# Patient Record
Sex: Female | Born: 1940
Health system: Southern US, Community
[De-identification: ages and names within clinical notes are randomized; demographics above are authoritative.]

## PROBLEM LIST (undated history)

## (undated) DIAGNOSIS — Z96649 Presence of unspecified artificial hip joint: Secondary | ICD-10-CM

## (undated) DIAGNOSIS — M810 Age-related osteoporosis without current pathological fracture: Secondary | ICD-10-CM

## (undated) DIAGNOSIS — C50919 Malignant neoplasm of unspecified site of unspecified female breast: Secondary | ICD-10-CM

## (undated) DIAGNOSIS — M1990 Unspecified osteoarthritis, unspecified site: Secondary | ICD-10-CM

## (undated) DIAGNOSIS — H269 Unspecified cataract: Secondary | ICD-10-CM

## (undated) DIAGNOSIS — R232 Flushing: Secondary | ICD-10-CM

## (undated) DIAGNOSIS — N390 Urinary tract infection, site not specified: Secondary | ICD-10-CM

## (undated) DIAGNOSIS — E785 Hyperlipidemia, unspecified: Secondary | ICD-10-CM

## (undated) DIAGNOSIS — Z8619 Personal history of other infectious and parasitic diseases: Secondary | ICD-10-CM

## (undated) DIAGNOSIS — F419 Anxiety disorder, unspecified: Secondary | ICD-10-CM

## (undated) DIAGNOSIS — T7840XA Allergy, unspecified, initial encounter: Secondary | ICD-10-CM

## (undated) DIAGNOSIS — Z5189 Encounter for other specified aftercare: Secondary | ICD-10-CM

## (undated) HISTORY — DX: Urinary tract infection, site not specified: N39.0

## (undated) HISTORY — DX: Hyperlipidemia, unspecified: E78.5

## (undated) HISTORY — PX: BREAST SURGERY: SHX581

## (undated) HISTORY — DX: Age-related osteoporosis without current pathological fracture: M81.0

## (undated) HISTORY — DX: Personal history of other infectious and parasitic diseases: Z86.19

## (undated) HISTORY — PX: TOTAL HIP ARTHROPLASTY: SHX124

## (undated) HISTORY — DX: Presence of unspecified artificial hip joint: Z96.649

## (undated) HISTORY — DX: Anxiety disorder, unspecified: F41.9

## (undated) HISTORY — DX: Encounter for other specified aftercare: Z51.89

## (undated) HISTORY — DX: Unspecified osteoarthritis, unspecified site: M19.90

## (undated) HISTORY — DX: Unspecified cataract: H26.9

## (undated) HISTORY — PX: OOPHORECTOMY: SHX86

## (undated) HISTORY — DX: Malignant neoplasm of unspecified site of unspecified female breast: C50.919

## (undated) HISTORY — DX: Flushing: R23.2

## (undated) HISTORY — PX: TUBAL LIGATION: SHX77

## (undated) HISTORY — PX: TOTAL SHOULDER REPLACEMENT: SUR1217

## (undated) HISTORY — DX: Allergy, unspecified, initial encounter: T78.40XA

## (undated) HISTORY — PX: JOINT REPLACEMENT: SHX530

## (undated) HISTORY — PX: EYE SURGERY: SHX253

## (undated) HISTORY — PX: POLYPECTOMY: SHX149

---

## 1982-02-21 ENCOUNTER — Encounter (INDEPENDENT_AMBULATORY_CARE_PROVIDER_SITE_OTHER): Payer: Self-pay | Admitting: *Deleted

## 1982-02-21 LAB — CONVERTED CEMR LAB
RBC count: 4.36 10*6/uL
WBC, blood: 6.5 10*3/uL

## 1982-02-22 ENCOUNTER — Encounter (INDEPENDENT_AMBULATORY_CARE_PROVIDER_SITE_OTHER): Payer: Self-pay | Admitting: Internal Medicine

## 1982-02-22 LAB — CONVERTED CEMR LAB
RBC count: 4.36 10*6/uL
WBC, blood: 6.5 10*3/uL

## 1995-09-26 ENCOUNTER — Encounter (INDEPENDENT_AMBULATORY_CARE_PROVIDER_SITE_OTHER): Payer: Self-pay | Admitting: *Deleted

## 1995-09-26 LAB — CONVERTED CEMR LAB: TSH: 0.69 microintl units/mL

## 1995-09-27 ENCOUNTER — Encounter (INDEPENDENT_AMBULATORY_CARE_PROVIDER_SITE_OTHER): Payer: Self-pay | Admitting: Internal Medicine

## 1995-09-27 LAB — CONVERTED CEMR LAB: TSH: 0.69 microintl units/mL

## 1996-04-03 ENCOUNTER — Encounter (INDEPENDENT_AMBULATORY_CARE_PROVIDER_SITE_OTHER): Payer: Self-pay | Admitting: *Deleted

## 1996-04-03 LAB — CONVERTED CEMR LAB: RBC count: 4.6 10*6/uL

## 1996-06-05 DIAGNOSIS — E7800 Pure hypercholesterolemia, unspecified: Secondary | ICD-10-CM | POA: Insufficient documentation

## 1996-10-01 ENCOUNTER — Encounter (INDEPENDENT_AMBULATORY_CARE_PROVIDER_SITE_OTHER): Payer: Self-pay | Admitting: *Deleted

## 1996-10-01 ENCOUNTER — Encounter (INDEPENDENT_AMBULATORY_CARE_PROVIDER_SITE_OTHER): Payer: Self-pay | Admitting: Internal Medicine

## 1996-10-01 LAB — CONVERTED CEMR LAB
Blood Glucose, Fasting: 79 mg/dL
Blood Glucose, Fasting: 79 mg/dL
RBC count: 4.6 10*6/uL
WBC, blood: 5.2 10*3/uL
WBC, blood: 5.2 10*3/uL

## 1997-01-28 ENCOUNTER — Encounter (INDEPENDENT_AMBULATORY_CARE_PROVIDER_SITE_OTHER): Payer: Self-pay | Admitting: *Deleted

## 1997-01-28 LAB — CONVERTED CEMR LAB: Rapid Strep: NEGATIVE

## 1997-04-30 ENCOUNTER — Other Ambulatory Visit: Admission: RE | Admit: 1997-04-30 | Discharge: 1997-04-30 | Payer: Self-pay | Admitting: Obstetrics and Gynecology

## 1998-04-13 ENCOUNTER — Other Ambulatory Visit: Admission: RE | Admit: 1998-04-13 | Discharge: 1998-04-13 | Payer: Self-pay | Admitting: Obstetrics and Gynecology

## 1998-12-25 ENCOUNTER — Emergency Department (HOSPITAL_COMMUNITY): Admission: EM | Admit: 1998-12-25 | Discharge: 1998-12-25 | Payer: Self-pay | Admitting: Emergency Medicine

## 1998-12-25 ENCOUNTER — Encounter: Payer: Self-pay | Admitting: Emergency Medicine

## 1999-04-13 ENCOUNTER — Other Ambulatory Visit: Admission: RE | Admit: 1999-04-13 | Discharge: 1999-04-13 | Payer: Self-pay | Admitting: Obstetrics and Gynecology

## 1999-06-07 ENCOUNTER — Encounter: Payer: Self-pay | Admitting: Urology

## 1999-06-07 ENCOUNTER — Encounter: Admission: RE | Admit: 1999-06-07 | Discharge: 1999-06-07 | Payer: Self-pay | Admitting: Urology

## 1999-11-21 ENCOUNTER — Encounter: Admission: RE | Admit: 1999-11-21 | Discharge: 1999-11-21 | Payer: Self-pay | Admitting: Family Medicine

## 1999-11-21 ENCOUNTER — Encounter: Payer: Self-pay | Admitting: Family Medicine

## 2000-05-06 ENCOUNTER — Other Ambulatory Visit: Admission: RE | Admit: 2000-05-06 | Discharge: 2000-05-06 | Payer: Self-pay | Admitting: Obstetrics and Gynecology

## 2000-08-13 ENCOUNTER — Encounter: Admission: RE | Admit: 2000-08-13 | Discharge: 2000-08-13 | Payer: Self-pay | Admitting: Neurology

## 2000-08-13 ENCOUNTER — Encounter: Payer: Self-pay | Admitting: Neurology

## 2000-10-02 ENCOUNTER — Other Ambulatory Visit: Admission: RE | Admit: 2000-10-02 | Discharge: 2000-10-02 | Payer: Self-pay | Admitting: Obstetrics and Gynecology

## 2000-10-02 ENCOUNTER — Encounter (INDEPENDENT_AMBULATORY_CARE_PROVIDER_SITE_OTHER): Payer: Self-pay | Admitting: Specialist

## 2001-02-20 ENCOUNTER — Encounter: Payer: Self-pay | Admitting: Sports Medicine

## 2001-02-20 ENCOUNTER — Encounter: Admission: RE | Admit: 2001-02-20 | Discharge: 2001-02-20 | Payer: Self-pay | Admitting: Sports Medicine

## 2001-05-19 ENCOUNTER — Encounter: Admission: RE | Admit: 2001-05-19 | Discharge: 2001-05-19 | Payer: Self-pay | Admitting: Orthopedic Surgery

## 2001-05-19 ENCOUNTER — Encounter: Payer: Self-pay | Admitting: Orthopedic Surgery

## 2001-05-20 ENCOUNTER — Other Ambulatory Visit: Admission: RE | Admit: 2001-05-20 | Discharge: 2001-05-20 | Payer: Self-pay | Admitting: Obstetrics and Gynecology

## 2001-06-02 ENCOUNTER — Encounter: Payer: Self-pay | Admitting: Orthopedic Surgery

## 2001-06-02 ENCOUNTER — Encounter: Admission: RE | Admit: 2001-06-02 | Discharge: 2001-06-02 | Payer: Self-pay | Admitting: Orthopedic Surgery

## 2001-08-04 ENCOUNTER — Encounter: Payer: Self-pay | Admitting: Orthopedic Surgery

## 2001-08-06 ENCOUNTER — Encounter: Payer: Self-pay | Admitting: Orthopedic Surgery

## 2001-08-06 ENCOUNTER — Inpatient Hospital Stay (HOSPITAL_COMMUNITY): Admission: RE | Admit: 2001-08-06 | Discharge: 2001-08-12 | Payer: Self-pay | Admitting: Orthopedic Surgery

## 2002-04-14 ENCOUNTER — Encounter: Admission: RE | Admit: 2002-04-14 | Discharge: 2002-04-14 | Payer: Self-pay | Admitting: Orthopedic Surgery

## 2002-04-14 ENCOUNTER — Encounter: Payer: Self-pay | Admitting: Orthopedic Surgery

## 2002-04-23 ENCOUNTER — Encounter (INDEPENDENT_AMBULATORY_CARE_PROVIDER_SITE_OTHER): Payer: Self-pay | Admitting: Internal Medicine

## 2002-04-23 ENCOUNTER — Emergency Department (HOSPITAL_COMMUNITY): Admission: EM | Admit: 2002-04-23 | Discharge: 2002-04-23 | Payer: Self-pay | Admitting: Emergency Medicine

## 2002-04-23 ENCOUNTER — Encounter: Payer: Self-pay | Admitting: Emergency Medicine

## 2002-04-23 LAB — CONVERTED CEMR LAB: Blood Glucose, Fasting: 97 mg/dL

## 2002-04-24 ENCOUNTER — Encounter (INDEPENDENT_AMBULATORY_CARE_PROVIDER_SITE_OTHER): Payer: Self-pay | Admitting: Internal Medicine

## 2002-04-24 ENCOUNTER — Encounter (INDEPENDENT_AMBULATORY_CARE_PROVIDER_SITE_OTHER): Payer: Self-pay | Admitting: *Deleted

## 2002-04-24 LAB — CONVERTED CEMR LAB
Blood Glucose, Fasting: 97 mg/dL
RBC count: 4.09 10*6/uL
WBC, blood: 5.5 10*3/uL

## 2002-06-15 ENCOUNTER — Other Ambulatory Visit: Admission: RE | Admit: 2002-06-15 | Discharge: 2002-06-15 | Payer: Self-pay | Admitting: Obstetrics and Gynecology

## 2002-08-10 DIAGNOSIS — K5730 Diverticulosis of large intestine without perforation or abscess without bleeding: Secondary | ICD-10-CM | POA: Insufficient documentation

## 2002-10-16 ENCOUNTER — Encounter: Payer: Self-pay | Admitting: Orthopedic Surgery

## 2002-10-16 ENCOUNTER — Inpatient Hospital Stay (HOSPITAL_COMMUNITY): Admission: RE | Admit: 2002-10-16 | Discharge: 2002-10-21 | Payer: Self-pay | Admitting: Orthopedic Surgery

## 2003-03-08 ENCOUNTER — Encounter: Admission: RE | Admit: 2003-03-08 | Discharge: 2003-03-08 | Payer: Self-pay | Admitting: Family Medicine

## 2003-07-19 ENCOUNTER — Other Ambulatory Visit: Admission: RE | Admit: 2003-07-19 | Discharge: 2003-07-19 | Payer: Self-pay | Admitting: Obstetrics and Gynecology

## 2003-07-26 ENCOUNTER — Encounter: Admission: RE | Admit: 2003-07-26 | Discharge: 2003-07-26 | Payer: Self-pay | Admitting: Obstetrics and Gynecology

## 2003-10-25 ENCOUNTER — Encounter (INDEPENDENT_AMBULATORY_CARE_PROVIDER_SITE_OTHER): Payer: Self-pay | Admitting: *Deleted

## 2003-10-25 LAB — CONVERTED CEMR LAB: Rapid Strep: NEGATIVE

## 2004-08-17 ENCOUNTER — Other Ambulatory Visit: Admission: RE | Admit: 2004-08-17 | Discharge: 2004-08-17 | Payer: Self-pay | Admitting: Obstetrics and Gynecology

## 2005-04-30 ENCOUNTER — Inpatient Hospital Stay (HOSPITAL_COMMUNITY): Admission: RE | Admit: 2005-04-30 | Discharge: 2005-05-02 | Payer: Self-pay | Admitting: Orthopedic Surgery

## 2005-05-02 ENCOUNTER — Encounter (INDEPENDENT_AMBULATORY_CARE_PROVIDER_SITE_OTHER): Payer: Self-pay | Admitting: Internal Medicine

## 2005-05-02 LAB — CONVERTED CEMR LAB
RBC count: 3.57 10*6/uL
WBC, blood: 7.1 10*3/uL

## 2006-10-13 ENCOUNTER — Encounter: Admission: RE | Admit: 2006-10-13 | Discharge: 2006-10-13 | Payer: Self-pay | Admitting: Radiology

## 2006-10-25 ENCOUNTER — Encounter (INDEPENDENT_AMBULATORY_CARE_PROVIDER_SITE_OTHER): Payer: Self-pay | Admitting: Diagnostic Radiology

## 2006-10-25 ENCOUNTER — Encounter: Admission: RE | Admit: 2006-10-25 | Discharge: 2006-10-25 | Payer: Self-pay | Admitting: Radiology

## 2006-11-01 ENCOUNTER — Encounter: Admission: RE | Admit: 2006-11-01 | Discharge: 2006-11-01 | Payer: Self-pay | Admitting: General Surgery

## 2006-11-04 ENCOUNTER — Encounter (INDEPENDENT_AMBULATORY_CARE_PROVIDER_SITE_OTHER): Payer: Self-pay | Admitting: General Surgery

## 2006-11-04 ENCOUNTER — Ambulatory Visit (HOSPITAL_BASED_OUTPATIENT_CLINIC_OR_DEPARTMENT_OTHER): Admission: RE | Admit: 2006-11-04 | Discharge: 2006-11-04 | Payer: Self-pay | Admitting: General Surgery

## 2006-11-08 ENCOUNTER — Ambulatory Visit: Payer: Self-pay | Admitting: Oncology

## 2006-11-18 ENCOUNTER — Ambulatory Visit: Admission: RE | Admit: 2006-11-18 | Discharge: 2007-01-26 | Payer: Self-pay | Admitting: Radiation Oncology

## 2006-11-19 LAB — CBC WITH DIFFERENTIAL/PLATELET
BASO%: 0.8 % (ref 0.0–2.0)
Basophils Absolute: 0 10*3/uL (ref 0.0–0.1)
EOS%: 1.5 % (ref 0.0–7.0)
Eosinophils Absolute: 0.1 10*3/uL (ref 0.0–0.5)
HCT: 38.7 % (ref 34.8–46.6)
HGB: 13.5 g/dL (ref 11.6–15.9)
LYMPH%: 34.4 % (ref 14.0–48.0)
MCH: 31.4 pg (ref 26.0–34.0)
MCHC: 34.9 g/dL (ref 32.0–36.0)
MCV: 89.9 fL (ref 81.0–101.0)
MONO#: 0.4 10*3/uL (ref 0.1–0.9)
MONO%: 9.4 % (ref 0.0–13.0)
NEUT#: 2.4 10*3/uL (ref 1.5–6.5)
NEUT%: 53.9 % (ref 39.6–76.8)
Platelets: 231 10*3/uL (ref 145–400)
RBC: 4.3 10*6/uL (ref 3.70–5.32)
RDW: 13.7 % (ref 11.3–14.5)
WBC: 4.4 10*3/uL (ref 3.9–10.0)
lymph#: 1.5 10*3/uL (ref 0.9–3.3)

## 2006-11-19 LAB — COMPREHENSIVE METABOLIC PANEL
ALT: 10 U/L (ref 0–35)
AST: 18 U/L (ref 0–37)
Alkaline Phosphatase: 61 U/L (ref 39–117)
BUN: 10 mg/dL (ref 6–23)
Creatinine, Ser: 0.61 mg/dL (ref 0.40–1.20)
Total Bilirubin: 0.6 mg/dL (ref 0.3–1.2)

## 2006-11-22 ENCOUNTER — Ambulatory Visit (HOSPITAL_COMMUNITY): Admission: RE | Admit: 2006-11-22 | Discharge: 2006-11-22 | Payer: Self-pay | Admitting: Oncology

## 2007-01-20 ENCOUNTER — Ambulatory Visit: Payer: Self-pay | Admitting: Oncology

## 2007-01-21 ENCOUNTER — Encounter: Payer: Self-pay | Admitting: Family Medicine

## 2007-02-10 ENCOUNTER — Encounter: Payer: Self-pay | Admitting: Family Medicine

## 2007-02-11 LAB — COMPREHENSIVE METABOLIC PANEL
ALT: 24 U/L (ref 0–35)
AST: 29 U/L (ref 0–37)
Calcium: 9.4 mg/dL (ref 8.4–10.5)
Chloride: 105 mEq/L (ref 96–112)
Creatinine, Ser: 0.67 mg/dL (ref 0.40–1.20)
Total Bilirubin: 0.7 mg/dL (ref 0.3–1.2)

## 2007-02-11 LAB — CBC WITH DIFFERENTIAL/PLATELET
BASO%: 0 % (ref 0.0–2.0)
EOS%: 2.2 % (ref 0.0–7.0)
HGB: 13.2 g/dL (ref 11.6–15.9)
MCV: 90.6 fL (ref 81.0–101.0)
NEUT#: 2.7 10*3/uL (ref 1.5–6.5)
NEUT%: 77 % — ABNORMAL HIGH (ref 39.6–76.8)
Platelets: 184 10*3/uL (ref 145–400)
RDW: 13.7 % (ref 11.3–14.5)
WBC: 3.5 10*3/uL — ABNORMAL LOW (ref 3.9–10.0)
lymph#: 0.4 10*3/uL — ABNORMAL LOW (ref 0.9–3.3)

## 2007-03-19 ENCOUNTER — Encounter: Payer: Self-pay | Admitting: Internal Medicine

## 2007-03-19 DIAGNOSIS — Z87448 Personal history of other diseases of urinary system: Secondary | ICD-10-CM | POA: Insufficient documentation

## 2007-03-19 DIAGNOSIS — M2240 Chondromalacia patellae, unspecified knee: Secondary | ICD-10-CM | POA: Insufficient documentation

## 2007-03-19 DIAGNOSIS — M810 Age-related osteoporosis without current pathological fracture: Secondary | ICD-10-CM | POA: Insufficient documentation

## 2007-04-28 ENCOUNTER — Ambulatory Visit: Payer: Self-pay | Admitting: Oncology

## 2007-04-28 ENCOUNTER — Encounter: Payer: Self-pay | Admitting: Family Medicine

## 2007-04-30 LAB — CBC WITH DIFFERENTIAL/PLATELET
BASO%: 0.6 % (ref 0.0–2.0)
EOS%: 1 % (ref 0.0–7.0)
HCT: 37.8 % (ref 34.8–46.6)
LYMPH%: 20.6 % (ref 14.0–48.0)
MCH: 31.6 pg (ref 26.0–34.0)
MCHC: 35.4 g/dL (ref 32.0–36.0)
MONO#: 0.3 10*3/uL (ref 0.1–0.9)
NEUT%: 69.1 % (ref 39.6–76.8)
Platelets: 194 10*3/uL (ref 145–400)
RBC: 4.24 10*6/uL (ref 3.70–5.32)
WBC: 3.6 10*3/uL — ABNORMAL LOW (ref 3.9–10.0)
lymph#: 0.7 10*3/uL — ABNORMAL LOW (ref 0.9–3.3)

## 2007-04-30 LAB — COMPREHENSIVE METABOLIC PANEL
ALT: 9 U/L (ref 0–35)
AST: 16 U/L (ref 0–37)
CO2: 26 mEq/L (ref 19–32)
Creatinine, Ser: 0.69 mg/dL (ref 0.40–1.20)
Sodium: 141 mEq/L (ref 135–145)
Total Bilirubin: 0.7 mg/dL (ref 0.3–1.2)
Total Protein: 6.6 g/dL (ref 6.0–8.3)

## 2007-04-30 LAB — LACTATE DEHYDROGENASE: LDH: 147 U/L (ref 94–250)

## 2007-04-30 LAB — CANCER ANTIGEN 27.29: CA 27.29: 14 U/mL (ref 0–39)

## 2007-04-30 LAB — FOLLICLE STIMULATING HORMONE: FSH: 62.2 m[IU]/mL

## 2007-05-19 LAB — ESTRADIOL, ULTRA SENS: Estradiol, Ultra Sensitive: <2 pg/mL

## 2007-10-29 ENCOUNTER — Ambulatory Visit: Payer: Self-pay | Admitting: Hematology

## 2007-11-13 ENCOUNTER — Ambulatory Visit: Payer: Self-pay | Admitting: Family Medicine

## 2007-11-13 DIAGNOSIS — M25569 Pain in unspecified knee: Secondary | ICD-10-CM | POA: Insufficient documentation

## 2007-12-05 ENCOUNTER — Encounter: Payer: Self-pay | Admitting: *Deleted

## 2007-12-05 DIAGNOSIS — C50919 Malignant neoplasm of unspecified site of unspecified female breast: Secondary | ICD-10-CM | POA: Insufficient documentation

## 2008-04-26 ENCOUNTER — Ambulatory Visit: Payer: Self-pay | Admitting: Hematology

## 2008-04-28 LAB — CBC WITH DIFFERENTIAL/PLATELET
Basophils Absolute: 0 10*3/uL (ref 0.0–0.1)
Eosinophils Absolute: 0.1 10*3/uL (ref 0.0–0.5)
HGB: 13 g/dL (ref 11.6–15.9)
MONO#: 0.3 10*3/uL (ref 0.1–0.9)
MONO%: 9.1 % (ref 0.0–14.0)
NEUT#: 2.4 10*3/uL (ref 1.5–6.5)
RBC: 4.06 10*6/uL (ref 3.70–5.45)
RDW: 13.7 % (ref 11.2–14.5)
WBC: 3.6 10*3/uL — ABNORMAL LOW (ref 3.9–10.3)
lymph#: 0.8 10*3/uL — ABNORMAL LOW (ref 0.9–3.3)

## 2008-04-29 LAB — COMPREHENSIVE METABOLIC PANEL
Albumin: 4.1 g/dL (ref 3.5–5.2)
BUN: 11 mg/dL (ref 6–23)
CO2: 27 mEq/L (ref 19–32)
Calcium: 9.4 mg/dL (ref 8.4–10.5)
Chloride: 105 mEq/L (ref 96–112)
Creatinine, Ser: 0.71 mg/dL (ref 0.40–1.20)
Potassium: 4.2 mEq/L (ref 3.5–5.3)

## 2008-04-29 LAB — VITAMIN D 25 HYDROXY (VIT D DEFICIENCY, FRACTURES): Vit D, 25-Hydroxy: 44 ng/mL (ref 30–89)

## 2008-04-29 LAB — LACTATE DEHYDROGENASE: LDH: 168 U/L (ref 94–250)

## 2008-05-04 IMAGING — CR DG CHEST 2V
2 series · 2 of 2 positions shown · non-contrast
Comparison: 04/24/2005

CLINICAL DATA: Preop evaluation for right breast cancer.

[w chest pa]
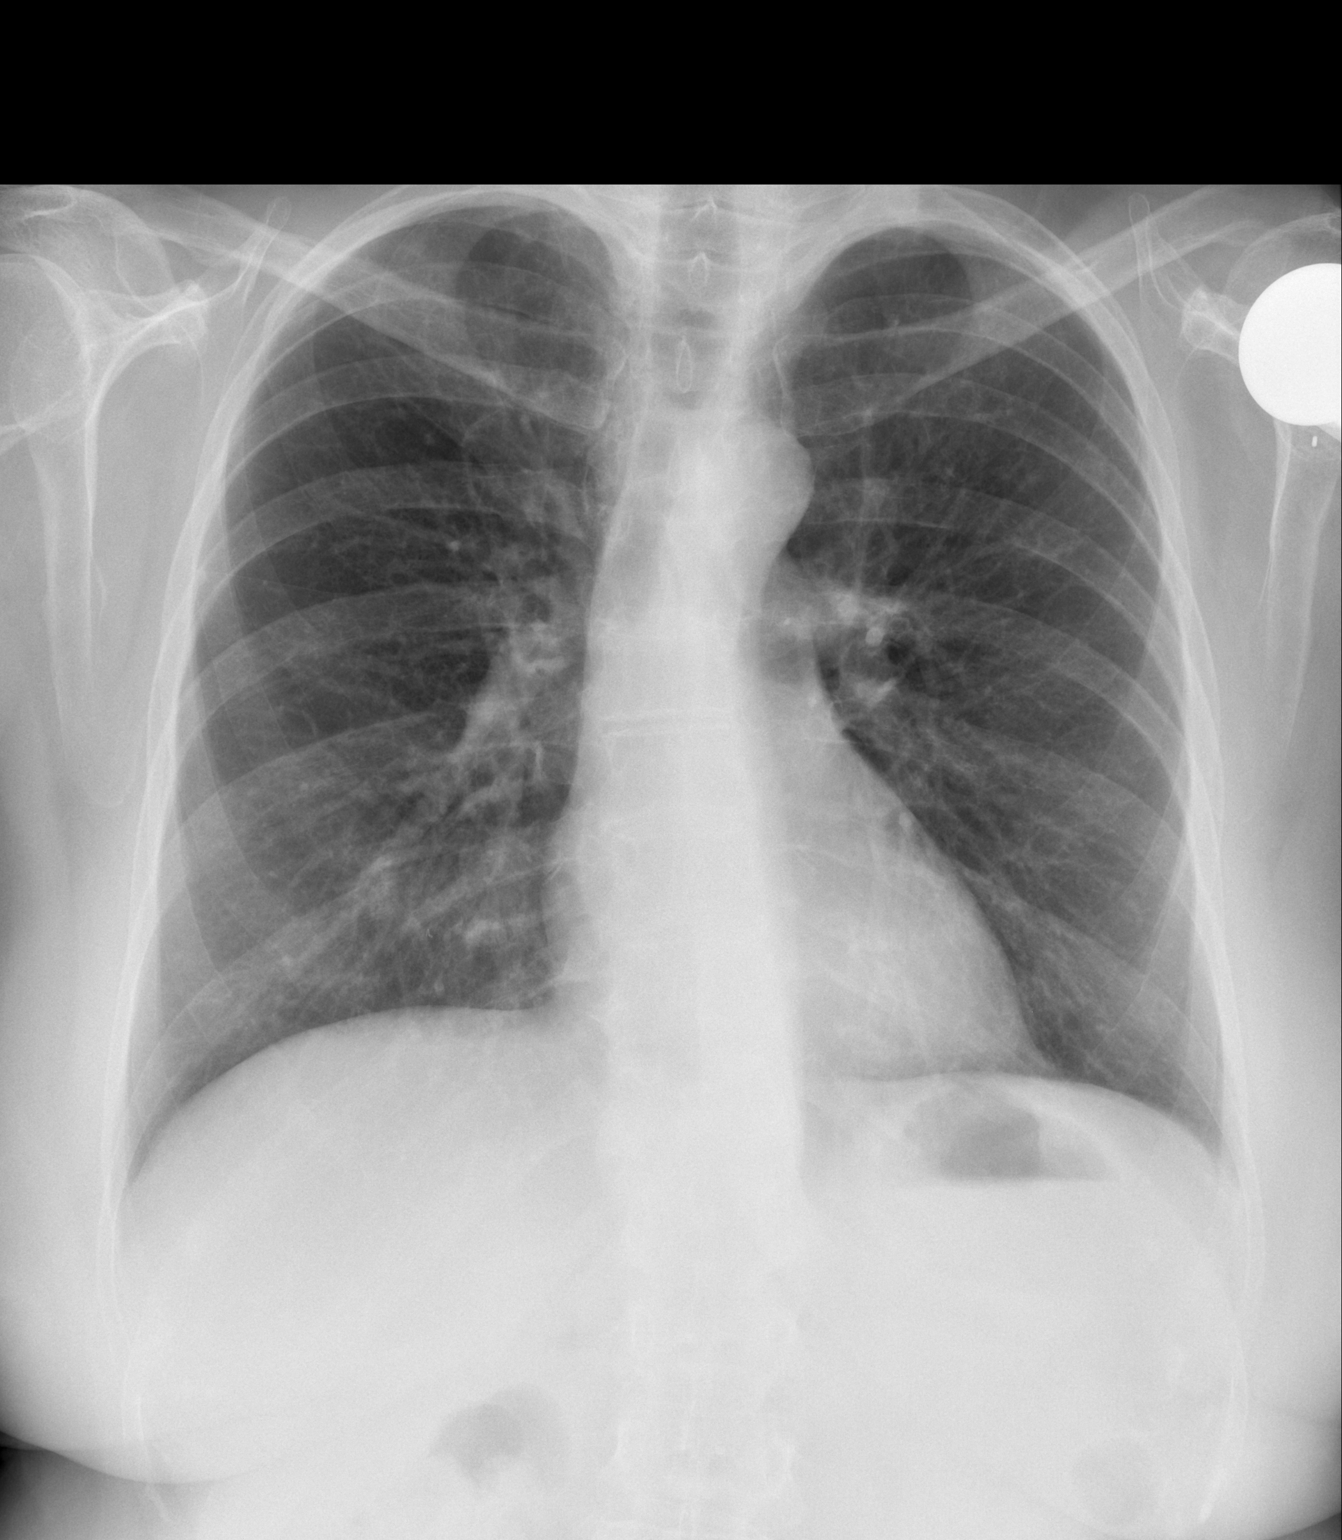

[w chest lat]
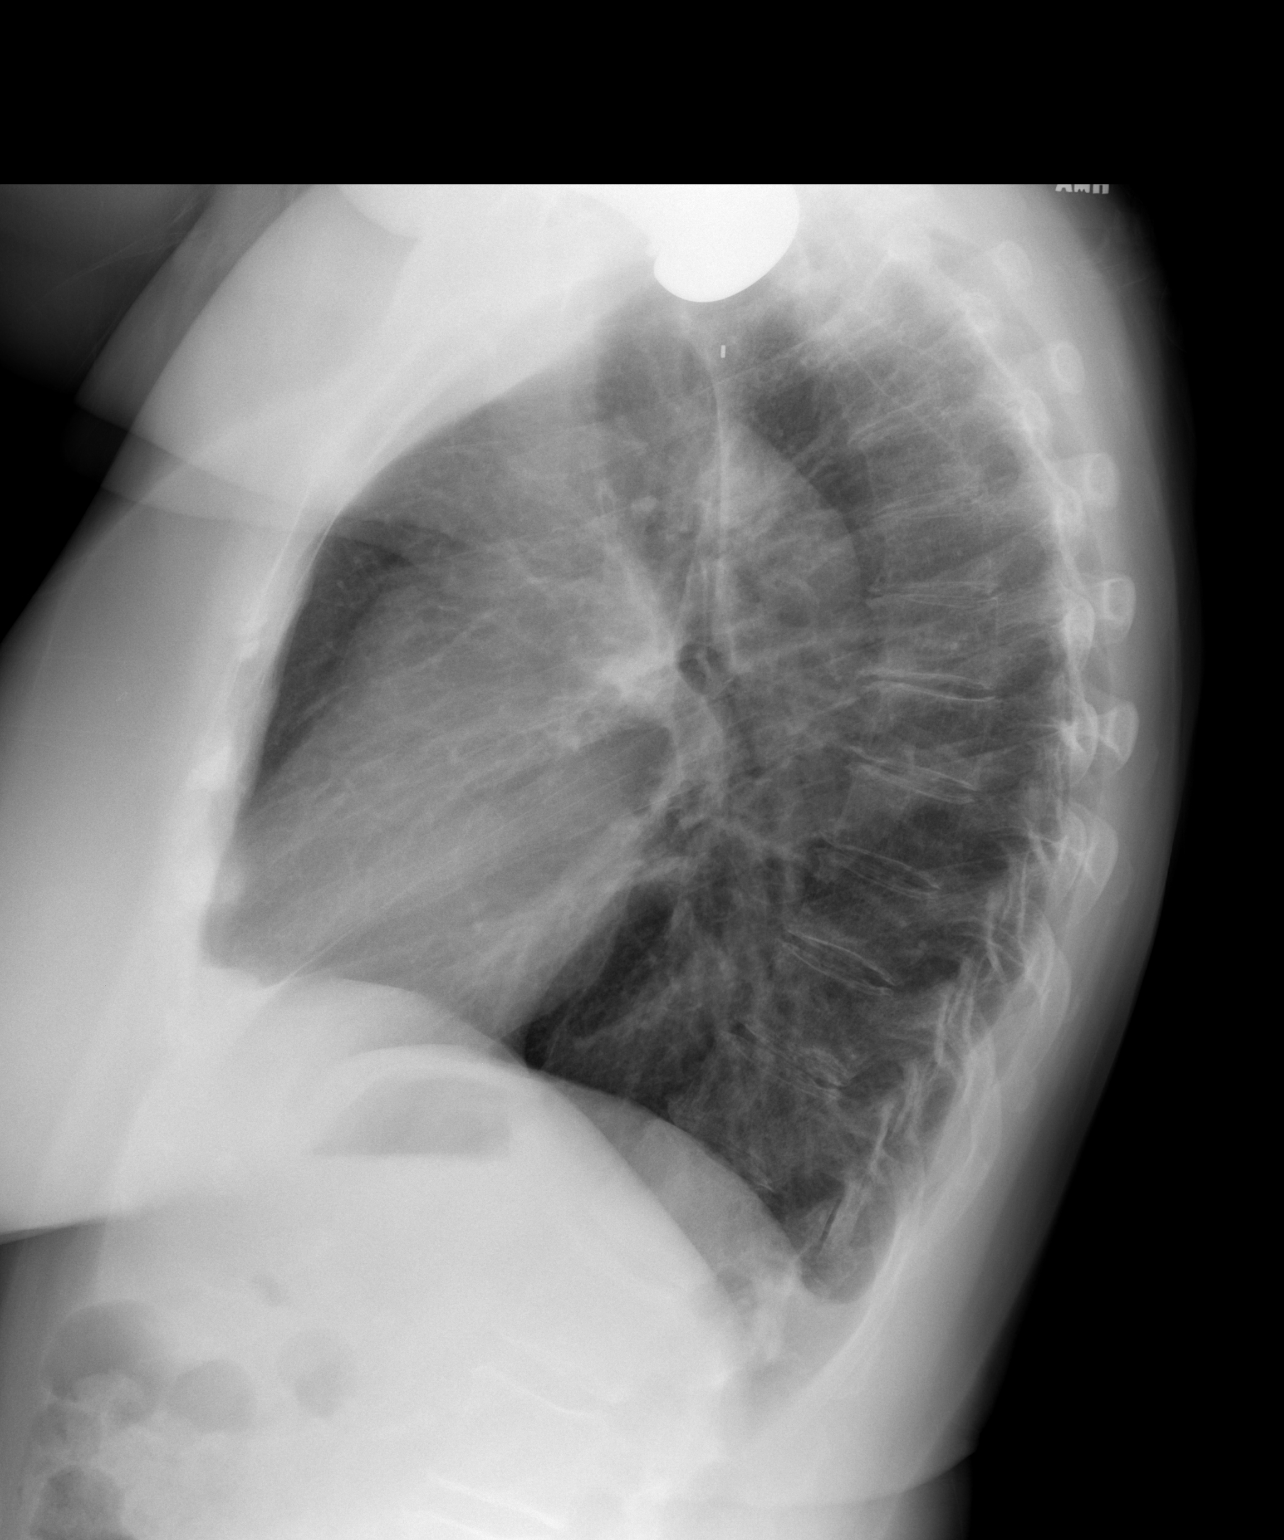

[2 of 2 positions shown; findings below may reference images not displayed]

CHEST - 2 VIEW:

  The lungs are clear. The cardiopericardial silhouette is within normal limits
for size. Patient has had interval left shoulder replacement.
IMPRESSION: No acute cardiopulmonary process

## 2008-11-10 ENCOUNTER — Ambulatory Visit: Payer: Self-pay | Admitting: Oncology

## 2008-11-12 LAB — CBC WITH DIFFERENTIAL/PLATELET
BASO%: 0.7 % (ref 0.0–2.0)
EOS%: 0.7 % (ref 0.0–7.0)
HGB: 14 g/dL (ref 11.6–15.9)
MCH: 32.1 pg (ref 25.1–34.0)
MCHC: 35 g/dL (ref 31.5–36.0)
MONO#: 0.3 10*3/uL (ref 0.1–0.9)
RDW: 13.8 % (ref 11.2–14.5)
WBC: 3.9 10*3/uL (ref 3.9–10.3)
lymph#: 0.9 10*3/uL (ref 0.9–3.3)

## 2008-11-13 LAB — COMPREHENSIVE METABOLIC PANEL
Alkaline Phosphatase: 67 U/L (ref 39–117)
CO2: 24 mEq/L (ref 19–32)
Creatinine, Ser: 0.73 mg/dL (ref 0.40–1.20)
Glucose, Bld: 95 mg/dL (ref 70–99)
Sodium: 142 mEq/L (ref 135–145)
Total Bilirubin: 0.6 mg/dL (ref 0.3–1.2)
Total Protein: 6 g/dL (ref 6.0–8.3)

## 2008-11-13 LAB — VITAMIN D 25 HYDROXY (VIT D DEFICIENCY, FRACTURES): Vit D, 25-Hydroxy: 40 ng/mL (ref 30–89)

## 2008-11-13 LAB — CANCER ANTIGEN 27.29: CA 27.29: 17 U/mL (ref 0–39)

## 2008-11-13 LAB — LACTATE DEHYDROGENASE: LDH: 160 U/L (ref 94–250)

## 2008-12-14 ENCOUNTER — Encounter: Payer: Self-pay | Admitting: Gastroenterology

## 2008-12-15 ENCOUNTER — Telehealth: Payer: Self-pay | Admitting: Gastroenterology

## 2008-12-15 ENCOUNTER — Encounter (INDEPENDENT_AMBULATORY_CARE_PROVIDER_SITE_OTHER): Payer: Self-pay

## 2008-12-24 ENCOUNTER — Encounter (INDEPENDENT_AMBULATORY_CARE_PROVIDER_SITE_OTHER): Payer: Self-pay | Admitting: *Deleted

## 2008-12-27 ENCOUNTER — Ambulatory Visit: Payer: Self-pay | Admitting: Gastroenterology

## 2009-01-03 ENCOUNTER — Ambulatory Visit: Payer: Self-pay | Admitting: Gastroenterology

## 2009-05-11 ENCOUNTER — Ambulatory Visit: Payer: Self-pay | Admitting: Oncology

## 2009-05-13 LAB — CBC WITH DIFFERENTIAL/PLATELET
BASO%: 0.6 % (ref 0.0–2.0)
Basophils Absolute: 0 10*3/uL (ref 0.0–0.1)
EOS%: 0.9 % (ref 0.0–7.0)
Eosinophils Absolute: 0 10*3/uL (ref 0.0–0.5)
HCT: 41.3 % (ref 34.8–46.6)
HGB: 14.2 g/dL (ref 11.6–15.9)
LYMPH%: 28.3 % (ref 14.0–49.7)
MCH: 31.9 pg (ref 25.1–34.0)
MCHC: 34.4 g/dL (ref 31.5–36.0)
MCV: 92.8 fL (ref 79.5–101.0)
MONO#: 0.4 10*3/uL (ref 0.1–0.9)
MONO%: 10.5 % (ref 0.0–14.0)
NEUT#: 2.5 10*3/uL (ref 1.5–6.5)
NEUT%: 59.7 % (ref 38.4–76.8)
Platelets: 207 10*3/uL (ref 145–400)
RBC: 4.45 10*6/uL (ref 3.70–5.45)
RDW: 13.7 % (ref 11.2–14.5)
WBC: 4.1 10*3/uL (ref 3.9–10.3)
lymph#: 1.2 10*3/uL (ref 0.9–3.3)

## 2009-05-13 LAB — COMPREHENSIVE METABOLIC PANEL
ALT: 17 U/L (ref 0–35)
AST: 41 U/L — ABNORMAL HIGH (ref 0–37)
Albumin: 4.5 g/dL (ref 3.5–5.2)
Alkaline Phosphatase: 64 U/L (ref 39–117)
BUN: 14 mg/dL (ref 6–23)
CO2: 23 mEq/L (ref 19–32)
Calcium: 9.7 mg/dL (ref 8.4–10.5)
Chloride: 104 mEq/L (ref 96–112)
Creatinine, Ser: 0.66 mg/dL (ref 0.40–1.20)
Glucose, Bld: 94 mg/dL (ref 70–99)
Potassium: 4.1 mEq/L (ref 3.5–5.3)
Sodium: 140 mEq/L (ref 135–145)
Total Bilirubin: 0.6 mg/dL (ref 0.3–1.2)
Total Protein: 6.8 g/dL (ref 6.0–8.3)

## 2009-05-13 LAB — VITAMIN D 25 HYDROXY (VIT D DEFICIENCY, FRACTURES): Vit D, 25-Hydroxy: 42 ng/mL (ref 30–89)

## 2009-05-13 LAB — CANCER ANTIGEN 27.29: CA 27.29: 18 U/mL (ref 0–39)

## 2009-05-13 LAB — LACTATE DEHYDROGENASE: LDH: 182 U/L (ref 94–250)

## 2009-09-20 ENCOUNTER — Encounter (INDEPENDENT_AMBULATORY_CARE_PROVIDER_SITE_OTHER): Payer: Self-pay | Admitting: *Deleted

## 2009-12-09 ENCOUNTER — Ambulatory Visit: Payer: Self-pay | Admitting: Oncology

## 2009-12-13 LAB — CBC WITH DIFFERENTIAL/PLATELET
BASO%: 0.6 % (ref 0.0–2.0)
Basophils Absolute: 0 10*3/uL (ref 0.0–0.1)
EOS%: 2.1 % (ref 0.0–7.0)
Eosinophils Absolute: 0.1 10*3/uL (ref 0.0–0.5)
HCT: 39.2 % (ref 34.8–46.6)
HGB: 13.5 g/dL (ref 11.6–15.9)
LYMPH%: 22 % (ref 14.0–49.7)
MCH: 31.5 pg (ref 25.1–34.0)
MCHC: 34.4 g/dL (ref 31.5–36.0)
MCV: 91.7 fL (ref 79.5–101.0)
MONO#: 0.4 10*3/uL (ref 0.1–0.9)
MONO%: 9.2 % (ref 0.0–14.0)
NEUT#: 2.7 10*3/uL (ref 1.5–6.5)
NEUT%: 66.1 % (ref 38.4–76.8)
Platelets: 202 10*3/uL (ref 145–400)
RBC: 4.27 10*6/uL (ref 3.70–5.45)
RDW: 13.3 % (ref 11.2–14.5)
WBC: 4.1 10*3/uL (ref 3.9–10.3)
lymph#: 0.9 10*3/uL (ref 0.9–3.3)

## 2009-12-14 LAB — CANCER ANTIGEN 27.29: CA 27.29: 15 U/mL (ref 0–39)

## 2009-12-14 LAB — COMPREHENSIVE METABOLIC PANEL
ALT: 11 U/L (ref 0–35)
AST: 18 U/L (ref 0–37)
Albumin: 4 g/dL (ref 3.5–5.2)
Alkaline Phosphatase: 64 U/L (ref 39–117)
BUN: 14 mg/dL (ref 6–23)
CO2: 23 mEq/L (ref 19–32)
Calcium: 9.1 mg/dL (ref 8.4–10.5)
Chloride: 104 mEq/L (ref 96–112)
Creatinine, Ser: 0.57 mg/dL (ref 0.40–1.20)
Glucose, Bld: 87 mg/dL (ref 70–99)
Potassium: 3.9 mEq/L (ref 3.5–5.3)
Sodium: 141 mEq/L (ref 135–145)
Total Bilirubin: 0.4 mg/dL (ref 0.3–1.2)
Total Protein: 6 g/dL (ref 6.0–8.3)

## 2009-12-14 LAB — LACTATE DEHYDROGENASE: LDH: 144 U/L (ref 94–250)

## 2009-12-14 LAB — VITAMIN D 25 HYDROXY (VIT D DEFICIENCY, FRACTURES): Vit D, 25-Hydroxy: 50 ng/mL (ref 30–89)

## 2010-03-05 ENCOUNTER — Encounter: Payer: Self-pay | Admitting: Obstetrics and Gynecology

## 2010-03-16 NOTE — Letter (Signed)
Summary: Nadara Eaton letter  [place] at [facility]  [address] [place], [place] [address]   Phone: [phone]  Fax: [phone]       09/20/2009 MRN: [id number]  [name] Price [address] [place], [place]  [address]  Dear Ms. Lodema Hong,  [facility] [place], and [facility] announce the retirement of Arta Silence, M.D., from full-time practice at the [facility] office effective August 11, 2009 and his plans of returning part-time.  It is important to Dr. Hetty Ely and to our practice that you understand that [facility] has seven physicians in our office for your health care needs.  We will continue to offer the same exceptional care that you have today.    Dr. Hetty Ely has spoken to many of you about his plans for retirement and returning part-time in the fall.   We will continue to work with you through the transition to schedule appointments for you in the office and meet the high standards that [facility] is committed to.   Again, it is with great pleasure that we share the news that Dr. Hetty Ely will return to [facility] in October of 2011 with a reduced schedule.    If you have any questions, or would like to request an appointment with one of our physicians, please call us at [phone] and press the option for Scheduling an appointment.  We take pleasure in providing you with excellent patient care and look forward to seeing you at your next office visit.  Our [place] Physicians are:  Tillman Abide, M.D. Laurita Quint, M.D. Roxy Manns, M.D. Kerby Nora, M.D. Hannah Beat, M.D. Ruthe Mannan, M.D. We proudly welcomed Raechel Ache, M.D. and [name], M.D. to the practice in July/August 2011.  Sincerely,  [facility]

## 2010-06-13 ENCOUNTER — Other Ambulatory Visit: Payer: Self-pay | Admitting: Oncology

## 2010-06-13 ENCOUNTER — Encounter (HOSPITAL_BASED_OUTPATIENT_CLINIC_OR_DEPARTMENT_OTHER): Payer: Medicare Other | Admitting: Oncology

## 2010-06-13 DIAGNOSIS — C50219 Malignant neoplasm of upper-inner quadrant of unspecified female breast: Secondary | ICD-10-CM

## 2010-06-13 DIAGNOSIS — Z170 Estrogen receptor positive status [ER+]: Secondary | ICD-10-CM

## 2010-06-13 LAB — CBC WITH DIFFERENTIAL/PLATELET
BASO%: 0.5 % (ref 0.0–2.0)
Basophils Absolute: 0 10*3/uL (ref 0.0–0.1)
EOS%: 0.9 % (ref 0.0–7.0)
Eosinophils Absolute: 0 10*3/uL (ref 0.0–0.5)
HCT: 39.9 % (ref 34.8–46.6)
HGB: 13.8 g/dL (ref 11.6–15.9)
LYMPH%: 26.3 % (ref 14.0–49.7)
MCH: 31.7 pg (ref 25.1–34.0)
MCHC: 34.6 g/dL (ref 31.5–36.0)
MCV: 91.8 fL (ref 79.5–101.0)
MONO#: 0.3 10*3/uL (ref 0.1–0.9)
MONO%: 8.3 % (ref 0.0–14.0)
NEUT#: 2.4 10*3/uL (ref 1.5–6.5)
NEUT%: 64 % (ref 38.4–76.8)
Platelets: 190 10*3/uL (ref 145–400)
RBC: 4.34 10*6/uL (ref 3.70–5.45)
RDW: 13.5 % (ref 11.2–14.5)
WBC: 3.7 10*3/uL — ABNORMAL LOW (ref 3.9–10.3)
lymph#: 1 10*3/uL (ref 0.9–3.3)

## 2010-06-14 LAB — COMPREHENSIVE METABOLIC PANEL
ALT: 13 U/L (ref 0–35)
CO2: 23 mEq/L (ref 19–32)
Calcium: 9.2 mg/dL (ref 8.4–10.5)
Chloride: 107 mEq/L (ref 96–112)
Creatinine, Ser: 0.68 mg/dL (ref 0.40–1.20)
Glucose, Bld: 99 mg/dL (ref 70–99)
Total Protein: 6.5 g/dL (ref 6.0–8.3)

## 2010-06-14 LAB — VITAMIN D 25 HYDROXY (VIT D DEFICIENCY, FRACTURES): Vit D, 25-Hydroxy: 55 ng/mL (ref 30–89)

## 2010-06-20 ENCOUNTER — Encounter (HOSPITAL_BASED_OUTPATIENT_CLINIC_OR_DEPARTMENT_OTHER): Payer: Medicare Other | Admitting: Oncology

## 2010-06-20 DIAGNOSIS — Z170 Estrogen receptor positive status [ER+]: Secondary | ICD-10-CM

## 2010-06-20 DIAGNOSIS — C50219 Malignant neoplasm of upper-inner quadrant of unspecified female breast: Secondary | ICD-10-CM

## 2010-06-27 NOTE — Op Note (Signed)
NAMESILVER, [name]             ACCOUNT NO.:  [id number]   MEDICAL RECORD NO.:  [id number]          PATIENT TYPE:  AMB   LOCATION:  DSC                          FACILITY:  [facility]   PHYSICIAN:  Angelia Mould. Derrell Lolling, M.D.DATE OF BIRTH:  Nov 25, 1940   DATE OF PROCEDURE:  11/04/2006  DATE OF DISCHARGE:                               OPERATIVE REPORT   PREOPERATIVE DIAGNOSIS:  Invasive cancer right breast.   POSTOPERATIVE DIAGNOSIS:  Invasive cancer right breast.   OPERATION PERFORMED:  1. Injection blue dye right breast.  2. Right partial mastectomy with needle localization and specimen      mammogram.  3. Right axillary sentinel lymph node mapping and biopsy.   SURGEON:  Dr. Claud Kelp.   OPERATIVE INDICATIONS:  This is a 70 year old white female who had  screening mammography and with findings of a small density in the right  breast at the 2 o'clock position 5 cm from the nipple.  She ultimately  had MRI and gamma imaging and ultrasound.  She ultimately had an image  guided biopsy of this small area which showed invasive mammary  carcinoma.  She has been counseled regarding her options for treatment.  She desires breast conservation and is felt to be a good candidate for  that.  We have discussed her case in breast multidisciplinary  conference.  She is brought to the operating room for right partial  mastectomy and sentinel node biopsy.   OPERATIVE TECHNIQUE:  The patient underwent needle localization at  [facility] office this morning and that needle localization was  done by Dr. Tor Netters and that was quite satisfactory.  The patient was  brought to [facility] day surgery center.  Her breast was injected with a  radionuclide by the nuclear medicine technician.  The patient was  brought to operating room.  General anesthesia was induced.  The  periareolar area was prepped with alcohol and the subareolar area was  injected 5 mL of blue dye, consisting of 2 mL of methylene blue  mixed  with 3 mL of saline.  After this injection, the breast was massaged for  about five minutes.  The anterior chest wall, right breast and right  axilla and lateral chest wall were prepped and draped in sterile  fashion.  The patient was given intravenous antibiotics prior to the  surgery.  The patient was identified as correct patient, correct  procedure and correct site.  0.5% Marcaine with epinephrine was used as  a local infiltration anesthetic.   I observed the insertion wire which was basically in the pre sternal  area and directed laterally.  I made a radially oriented incision in the  2 o'clock position of the right breast.  With electrocautery, I  dissected down and widely around the localizing wire.  I took the  dissection all the way down to the pectoralis fascia and I removed the  pectoralis fascia with the tumor and the breast tissue around it.  I  marked the specimen with silk sutures to orient the pathologist.  I also  took a narrow ellipse of skin to denote the superficial  margin.  The  specimen was sent over to [facility] office.  Dr. Tor Netters  performed specimen mammogram.  He said I had widely negative margins in  all directions up to and beyond perhaps 2 cm in most areas.  Dr. Berneta Levins looked at the specimen and thought that I had a negative margins  but that it was closer perhaps just a few millimeters at the deep  margin.  Given that we had taken the breast tissue all the way down to  and including the pectoralis fascia, we did not feel any further  resection needed to be done.  From my standpoint the margin was clearly  negative grossly.  I undermined the breast tissue superiorly and  inferiorly and closed the deeper tissues with interrupted sutures of 3-0  Vicryl.  This closed the deeper tissues quite nicely.  I closed some of  the superficial breast tissue with interrupted sutures of 3-0 Vicryl.  Skin was closed with running subcuticular suture of  4-0 Monocryl and  Steri-Strips.   I turned my attention to the right axilla.  Using the NeoProbe I  isolated the general area where there was some increased activity.  A  transverse incision was made in the right axilla right at the hairline.  Dissection was carried down through subcutaneous tissue until I entered  the axillary space by dividing the clavipectoral fascia.  Using the  NeoProbe as a guide, I found two small but hot lymph nodes.  One of them  was very blue and I labeled that sentinel node number one and one of  them was hot but just had a little bit of blue dye and I labeled that  sentinel lymph node #2.  Dr. Clelia Croft performed imprint cytology and said  there was no evidence of cancer in either of the sentinel nodes.  Hemostasis was excellent and achieved electrocautery.  The wound was  irrigated with saline.  The deep subcutaneous tissue was closed with  interrupted sutures of 3-0 Vicryl.  Skin closed with a running  subcuticular suture of 4-0 Monocryl and Steri-Strips.  Clean bandages  were placed and the patient taken recovery room in stable condition.  Estimated blood loss was about 20 mL or less.  Complications none.  Sponge, needle and instrument counts were correct.      Angelia Mould. Derrell Lolling, M.D.  Electronically Signed     HMI/MEDQ  D:  11/04/2006  T:  11/05/2006  Job:  [id number]   cc:   Juluis Mire, M.D.  Arta Silence, MD

## 2010-06-30 NOTE — Discharge Summary (Signed)
[name] Price, [name] Price             ACCOUNT NO.:  [id number]   MEDICAL RECORD NO.:  [id number]          PATIENT TYPE:  INP   LOCATION:  5015                         FACILITY:  [facility]   PHYSICIAN:  Elana Alm. Thurston Hole, M.D. DATE OF BIRTH:  Jul 02, 1940   DATE OF ADMISSION:  04/30/2005  DATE OF DISCHARGE:  05/02/2005                                 DISCHARGE SUMMARY   ADMITTING DIAGNOSIS:  1.  End stage degenerative joint disease, left shoulder.  2.  History of arthritis.  3.  Depression.   DISCHARGE DIAGNOSIS:  1.  End stage degenerative joint disease left shoulder.  2.  History of arthritis status post hip replacement.  3.  Depression.   HISTORY OF PRESENT ILLNESS:  The patient is a 70 year old white female with  a history of end stage DJD of her left shoulder. She has failed conservative  care including interarticular cortisone injections, formal physical therapy,  and anti-inflammatories.  She understands the risks, benefits, and possible  complications of a total shoulder replacement and is without question.   PROCEDURES IN HOUSE:  On April 30, 2005, the patient underwent a left total  shoulder replacement by Dr. Thurston Hole.  She tolerated the procedure well and  was admitted postoperatively for pain control.   On postop day one, the patient was doing well except for increased pain and  decreased appetite.  Hemoglobin was 11.1.  The surgical wound was well  approximated.  She was afebrile. Her PCA was discontinued.  She was started  on p.o. pain medicine.  Postop day two, the patient's pain is much more  controlled. She is off all IV pain medicine.  She is taking p.o. pain  medicine and tolerating it well, controlling her pain.  Her hemoglobins  11.3.  She is afebrile.  Her vital signs are stable.  She is metabolically  stable.  Her surgical wound is well approximated with no excess drainage.  She is neurovascularly intact.  She is discharged to home in stable  condition with her left  arm in a sling. She has been instructed to keep this  on except for bathing and dressing.   DISCHARGE MEDICATIONS:  Percocet 1-2 q.4-6h. p.r.n. pain, Robaxin 500 mg 1  q.4-6h. p.r.n. muscle spasm, Paxil CR 1 tablet daily, calcium daily,  multivitamin daily, and Mobic 15 mg daily.   DISCHARGE INSTRUCTIONS:  She will follow up with Dr. Thurston Hole on May 07, 2005.      Kirstin Shepperson, P.A.      Robert A. Thurston Hole, M.D.  Electronically Signed    KS/MEDQ  D:  07/10/2005  T:  07/10/2005  Job:  [id number]

## 2010-06-30 NOTE — Op Note (Signed)
NAME:  [name] Price, [name] Price                       ACCOUNT NO.:  [id number]   MEDICAL RECORD NO.:  [id number]                   PATIENT TYPE:  INP   LOCATION:  5035                                 FACILITY:  [facility]   PHYSICIAN:  Elana Alm. Thurston Hole, M.D.              DATE OF BIRTH:  1940-12-05   DATE OF PROCEDURE:  10/16/2002  DATE OF DISCHARGE:                                 OPERATIVE REPORT   PREOPERATIVE DIAGNOSIS:  Right hip degenerative joint disease.   POSTOPERATIVE DIAGNOSIS:  Right hip degenerative joint disease.   PROCEDURE:  Right total hip replacement using Osteonix total hip system with  acetabulum #50 press fit PCL Trident acetabulum with two locking screws and  10 degree polyethylene liner.  Femoral component #9 Secure-Fit Plus with 13  mm distal tip with -3 by 32 mm ceramic femoral head.   SURGEON:  Elana Alm. Thurston Hole, M.D.   ASSISTANT:  Julien Girt, P.A.   ANESTHESIA:  General.   OPERATIVE TIME:  1 hour 40 minutes.   ESTIMATED BLOOD LOSS:  450 mL.   COMPLICATIONS:  None.   DESCRIPTION OF PROCEDURE:  [name] Price was brought to the operating room on  October 16, 2002, and placed on the operating table in supine position.  After an adequate level of general anesthesia was obtained, her right hip  was examined under anesthesia.  She had flexion to 90, extension to 0,  internal and external rotation of 30 degrees with approximately 1 inch of  shortening in the right leg compared to the left.  She had a Foley catheter  placed under sterile conditions and received Ancef 1 gram IV preoperatively  for prophylaxis.  She was then placed in right lateral up decubitus position  and secured on the bed with a Mark frame.  Her right hip and leg was then  prepped with DuraPrep and draped using sterile technique.   Originally, through a 15 cm posterolateral greater trochanteric incision  initial exposure was made.  The underlying subcutaneous tissues were incised  along  with the skin incision.  The iliotibial band and gluteus maximus  fascia was incised longitudinally and the underlying sciatic nerve was  carefully protected.  The short external rotators of the hip and hip capsule  were carefully released off the femoral neck insertion and tagged and then  the hip was posteriorly dislocated.  She was found to have significant grade  3 and 4 chondromalacia and DJD of the femoral head.  The femoral neck cut  was made 1.5 cm above the lesser trochanter in the appropriate amount of  anteversion and inclination.  Carefully placed retractors were then placed  around the acetabulum.  Degenerative acetabular labrum was removed.  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  Sequential  acetabular reaming was then carried out to a 50 mm size and then a 50 trial  was placed  in the appropriate amount of anteversion and abduction and this  was found to give an excellent fit.  It was then removed and the actual 50  mm PSL press fit acetabulum was hammered into position with an excellent  press fit in the appropriate amount of anteversion and abduction.  Further  two locking screws were placed for further fixation of the acetabulum, one  in the 11 and one in the 12 o'clock position, each of these 20 mm in length.  After this was done, a 10 degree polyethylene liner was placed with a 10  degree lip in the posterolateral position and hammered into position.  After  this was done, the proximal femur was exposed.  Sequential axial reaming was  used up to a #9 size followed by broaches to a #9 size with a #9 broach in  place, we had excellent fixation and a +0 by 32 mm trial head was placed.  This gave [place] slight increased length as opposed to two equalizing leg  lengths and, thus, a -3 by 32 mm femoral head trial was placed.  The hip was  taken through a full range of motion and found to be stable up to 60 to 70  degrees of internal rotation in both  neutral and 30 degrees of adduction and  also stable in abduction and external rotation and the leg lengths were  found to be equal.  This was then removed.  Distal reaming was carried out  to a 13.5 mm size on the femoral canal.  At this point, the femoral canal  was irrigated and an actual #9 Secure-Fit Plus with a 13 mm tip was hammered  into position with an excellent press fit.  A -3 by 32 mm ceramic femoral  head was hammered onto the femoral neck with an excellent Morris taper fit.  The hip was then reduced, taken through a full range of motion, and found to  be stable and leg lengths equal.  At this point, it was felt that all the  components were of excellent size, fit, and stability.  The wound was  further irrigated with antibiotic solution.  The short external rotators of  the hip and hip capsule were then reattached to their femoral neck insertion  through two drill holes in the greater trochanter.  The iliotibial band and  gluteus maximus fascia was repaired with #1 Ethibond suture.  The  subcutaneous tissue was closed with 0 and 2-0 Vicryl.  The skin was closed  with skin staples.  Sterile dressings were applied.  The patient was then  turned supine.  A check for the leg lengths were equal, rotation was equal,  neurovascular status was normal.  Interoperative AP pelvis x-ray was  obtained and found to have excellent position of the new total hip on the  right as well as the total hip on the left which had been placed a year ago.  The patient was then awakened, extubated, and taken to the recovery room in  stable condition.  Needle and sponge counts were correct x 2 at the end of  the case.                                               Robert A. Thurston Hole, M.D.    RAW/MEDQ  D:  10/16/2002  T:  10/16/2002  Job:  [id number]  

## 2010-06-30 NOTE — Op Note (Signed)
[place]  Patient:    [name] Price, [name] Price Visit Number: [id number] MRN: [id number]          Service Type: SUR Location: 5000 5035 [id number] Attending Physician:  Twana First Dictated by:   Elana Alm Thurston Hole, M.D. Proc. Date: 08/06/01 Admit Date:  08/06/2001                             Operative Report  PREOPERATIVE DIAGNOSIS:  Left hip degenerative joint disease.  POSTOPERATIVE DIAGNOSIS:  Left hip degenerative joint disease.  PROCEDURE:  Left total hip replacement using Osteonics total hip system with #60 press-fit PSL acetabulum with four locking screws and 10 degree polyethylene liner.  A #9 femoral component, Secure-Fit Plus, with -3 x 28 mm femoral head with 13 mm distal tip, press-fit.  SURGEON: Elana Alm. Thurston Hole, M.D.  ASSISTANT:  Julien Girt, P.A.  ANESTHESIA:  General.  OPERATIVE TIME:  2 hours 10 minutes.  ESTIMATED BLOOD LOSS:  400 cc.  COMPLICATIONS:  None.  DESCRIPTION OF PROCEDURE:  [name] Price was brought to the operating room on August 06, 2001, placed on the operating table in supine position.  After an adequate level of general anesthesia was obtained, her left hip was examined under anesthesia.  She had flexion to 100, extension to 10 degrees, internal and external rotation of 60 degrees.  No leg length discrepancies noted. Pulses 2+ and symmetric.  She was then turned to the left lateral up decubitus position and secured on the bed with a Cisco.  Her left hip and leg were prepped using sterile Duraprep and draped using sterile technique.  Initially through a 20 cm posterolateral incision, initial exposure was made.  The underlying subcutaneous tissues were incised in line with the skin incision. Initial exposure was made.  The underlying subcutaneous tissues were incised in line with the skin incision.  The iliotibial band and gluteus maximus fascia were incised longitudinally, revealing the underlying  sciatic nerve, which was carefully protected.  The short external rotators of the hip and hip capsule were released off their femoral neck insertion and tagged.  After this was done, then the hip was dislocated posteriorly.  She was found to have grade 3 and 4 chondromalacia on the superior aspect of the femoral head and the acetabulum.  A femoral neck cut was made 1.5 cm above the lesser trochanter in the appropriate amount of abduction and anteversion.  The acetabulum was carefully exposed with retractors.  The labrum was excised. She was found to have grade 3 and mild grade 4 chondromalacia on her acetabulum as well.  Sequential acetabular reaming was then performed up to a 58 mm size, and then a 60 mm trial was placed.  This did give satisfactory position, although she did have a somewhat deficient anterior acetabular wall in the anterior inferior position.  The acetabular trial was removed and then an actual 60 mm PSL acetabulum was then placed, hammered into position in the appropriate amount of anteversion and abduction, again with satisfactory but not extremely tight fixation; however, this was further secured with four locking screws in the 11, 12, 1, and 2 oclock position.  Each of these were 16 and 20 mm alternating in length.  This gave excellent further fixation.  A 10 degree polyethylene liner was then placed with a 10 degree lip in the posterolateral position.  The proximal femur was then exposed.  Sequential axial reamers  were used up to a #9 size, followed by broaches with a #9 sizer. With a #9 broach in place, a -3 x 28 mm femoral head trial was placed, the hip reduced, taken through a full range of motion, found to be stable up to 70 degrees of internal rotation in both neutral and 30 degrees of adduction and also stable in abduction and external rotation.  The leg lengths were found to be equalized.  The trial prosthesis was then removed.  The distal femoral canal was  then reamed up to a 13.5 mm size.  At this point, then, the actual Secure-Fit Plus prosthesis #9 was hammered into position with an excellent fit, with a -3 x 28 mm femoral head.  The hip then again reduced, taken through a full range of motion and found to be stable and leg lengths equal. At this point it was felt that all the components were of excellent size, fit, and stability.  The wound was further irrigated with antibiotic solution.  The hip capsule and short external rotators of the hip were then reattached to their femoral neck insertion through two drill holes in the greater trochanter.  The iliotibial band and gluteus maximus fascia was closed with #1 Ethibond suture, subcutaneous tissues closed with 0 and 2-0 Vicryl, skin closed with skin staples.  Sterile dressings were applied.  Hip abduction pillow applied.  The patient then turned supine, awakened, extubated, and taken to the recovery room in stable condition.  Neurovascular status was normal.  Leg lengths were equal.  Rotation was equal.  Needle and sponge counts correct x2 at the end of the case. Dictated by:   Elana Alm Thurston Hole, M.D. Attending Physician:  Twana First DD:  08/06/01 TD:  08/08/01 Job: [id number]

## 2010-06-30 NOTE — Discharge Summary (Signed)
[place]. [facility]  Patient:    [name] Price, [name] Price Visit Number: [id number] MRN: [id number]          Service Type: SUR Location: 5000 [id number] Attending Physician:  Twana First Dictated by:   Julien Girt, P.A.-C Admit Date:  08/06/2001 Discharge Date: 08/12/2001                             Discharge Summary  ADMISSION DIAGNOSIS:  End stage degenerative joint disease, left hip.  HISTORY OF PRESENT ILLNESS:  The patient is a 70 year old female with a history of bilateral hip end stage degenerative joint disease, left greater than right.  She has tried anti-inflammatories and intra-articular cortisone injections all without success.  She understands the risks, benefits, and possible complications of a left total hip replacement.  PROCEDURES:  On 08/06/01, the patient had a left total hip replacement by Dr. Thurston Hole.  The patient tolerated the procedure well.  She was admitted postoperative for pain control, deep vein thrombosis prophylaxis, and physical therapy.  HOSPITAL COURSE:  On postoperative day #1, the patient had difficulty with nausea, no other complaints.  Hemoglobin was 9.1.  Surgical wound was well approximated.  Her Foley was discontinued.  Her dressing was changed.  She is touchdown weightbearing.  On postoperative day #2, the patient had some difficulty with back pain, better when she was sleeping with her pillow from home.  Temperature max was 101.5.  Hemoglobin was 8.3.  We did discuss the possibility of a need for transfusion.  At this point in time she was asymptomatic with no shortness of breath, no chest pain.  IV fluids were discontinued.  Dressing was changed.  On postoperative day #3, the patient was sitting on the side of the bed, no lightheadedness, no dizziness, no shortness of breath, no chest pain.  Hemoglobin was 7.8.  Discussed decreased hemoglobin, and the need for a transfusion.  The patient did not want  a transfusion, despite the fact that her hemoglobin was low.  She did not have any type of shortness of breath or any other symptoms from her low hemoglobin. She was switched to Vicodin for pain.  She was given a potassium supplement because her potassium was 3.4.  On postoperative day #4, surgical wound was well approximated, hemoglobin was 7.6.  Once again, discussed the importance of a transfusion.  The patient would like to hold off on a transfusion, so she was kept hospitalized due to the light-headedness.  On postoperative day #5, hemoglobin was 8.9.  The patient was progressing with physical therapy.  On postoperative day #6, hemoglobin was 8.2.  The patient was stable from an orthopedic standpoint, surgical wound was well approximated.  She was discharged to home in stable condition, touchdown weightbearing, on a regular diet.  DISCHARGE MEDICATIONS: 1. Norco 5/325 one or two q.4-6h. p.r.n. pain. 2. Coumadin 4 mg q.d. 3. Iron one tablet b.i.d. 4. Colace 100 mg b.i.d.  ACTIVITY:  She is touchdown weightbearing.  Once again, reminded her of total hip precautions.  WOUND CARE:  Surgical wound is to be kept clean and dry.  DIET:  Regular.  DISCHARGE INSTRUCTIONS:  She is to call if temperature greater than 101, increased redness, increased pain, increased swelling.  FOLLOWUP:  In the office in 10 days. Dictated by:   Julien Girt, P.A.-C Attending Physician:  Twana First DD:  08/25/01 TD:  08/27/01 Job: [id number]

## 2010-06-30 NOTE — Op Note (Signed)
[name] Price, [name] Price             ACCOUNT NO.:  [id number]   MEDICAL RECORD NO.:  [id number]          PATIENT TYPE:  INP   LOCATION:  5015                         FACILITY:  [facility]   PHYSICIAN:  Elana Alm. Thurston Hole, M.D. DATE OF BIRTH:  [birth year]   DATE OF PROCEDURE:  04/30/2005  DATE OF DISCHARGE:                                 OPERATIVE REPORT   PREOPERATIVE DIAGNOSIS:  Left shoulder degenerative joint disease.   POSTOPERATIVE DIAGNOSIS:  Left shoulder degenerative joint disease.   PROCEDURE:  Left total shoulder replacement using [employer] total shoulder  system with #5 cemented glenoid with a #14 press-fit humeral stem with 40 x  18 mm humeral head with 4 mm offset.   SURGEON:  Elana Alm. Thurston Hole, M.D.   ASSISTANTS:  Loreta Ave, M.D., and Julien Girt, P.A.   ANESTHESIA:  General.   OPERATIVE TIME:  1 hour 40 minutes.   COMPLICATIONS:  None.   DESCRIPTION OF PROCEDURE:  [name] Price was brought to the operating room on  April 30, 2005, after an interscalene block was placed in the holding room  by anesthesia.  She was placed on the operating table in supine position.  She received Ancef 1 g IV preoperatively for prophylaxis.  After being  placed under general anesthesia, she had range of motion tested.  She had  forward flexion of 160, abduction of 150, internal and external rotation of  60 degrees.  Her shoulder was stable to ligamentous exam.  After being  placed under general anesthesia, she was placed in a beach chair position  and her shoulder and arm were prepped using sterile DuraPrep and draped  using sterile technique.  Originally through a 5-6 cm deltopectoral  incision, initial exposure was made.  The underlying subcutaneous tissues  were incised along the skin incision.  The cephalic vein was carefully  exposed and carefully retracted laterally with the deltoid while the  pectoralis carefully retracted medially.  The interval down to the  subscapularis was carefully bluntly dissected down and then the conjoined  tendon carefully retracted medially, protecting the musculocutaneous nerve.  The subscapularis tendon along with its capsular attachment was then  released off the lesser tuberosity, making superior and inferior limbs.  The  biceps tendon was found to be partially torn, and this was repaired with #2  Ethibond suture.  After the subscapularis and capsule were reflected  medially such that the glenohumeral joint was entered, there was an  excessive amount of normal-appearing joint fluid, which was removed.  The  glenohumeral articulations were inspected.  She had grade 3 and 4  chondromalacia and DJD on both the glenoid and the humeral head.  The  humeral head was then delivered up into the wound and with the axillary  nerve carefully protected as well as the biceps tendon, the humeral neck cut  was made in the appropriate amount of retroversion and inclination.  The  humeral head was then removed.  It was sized, found to be approximately a  #40 x 18 mm size.  After this humeral neck cut had been made and osteophytes  were  removed on the humeral neck, then carefully-placed retractors were  placed around the glenoid.  Degenerative labrum was removed from around the  glenoid.  The glenoid was sized, a #5 was found to be the appropriate size.  The glenoid reamer was then used to ream the glenoid down to bleeding bone  and then two drill holes were placed for the #5 glenoid component in the  appropriate position.  After this was done, the wound was copiously  irrigated.  Her bone stock remained good in the glenoid.  The #5 trial was  placed and found to give an excellent fit.  This was then removed, the wound  further irrigated and then the #5 glenoid component with cement vacuum was  placed into position and held in place with the impacter while excess cement  was removed from around the edges.  After the cement had  hardened, again the  glenoid position was found to be excellent with no excessive overhang and  excellent position.  At this point attention was turned back to the humerus.  Sequential hand-held reamers were used to ream to a 13-14 size and then with  a #14 broach, this was placed down the humeral shaft and hammered into  position and then with a #40 x 18 with a 4 mm offset humeral head trial, the  glenohumeral joint was reduced, found to give excellent articulation,  excellent stability through a full range of motion.  At this point, then the  humeral trial components were removed.  The wound was irrigated.  The fin  broach was then used to cut the fin.  The position was in the appropriate  amount of retroversion.  At this point, then the #14 press-fit humeral stem  was hammered into position with an excellent press fit and then the #40 x 18  mm with a #4 offset was hammered into position with an excellent Morse taper  fit on the humeral neck.  The shoulder reduced, taken through a full range  of motion and found to be stable with excellent articulation noted.  At this  point it was felt that all the components were of excellent size, fit and  stability.  The wound was further irrigated and then the subscapularis  tendon with the anterior capsule was closed with multiple mattress #2  Ethibond sutures.  This gave excellent stability and excellent closure over  the glenohumeral joint. The deltopectoral groove was then closed loosely,  closing only the fascia with 2-0 Vicryl, the subcutaneous tissues closed  with 2-0 Vicryl and the subcuticular layer closed with 3-0 Monocryl.  Steri-  Strips were applied, sterile dressings and a shoulder splint applied, and  then the patient awakened, extubated and taken to the recovery room in  stable condition.  Needle and sponge count was correct x2 at the end of the  case.      Robert A. Thurston Hole, M.D. Electronically Signed     RAW/MEDQ  D:   04/30/2005  T:  05/01/2005  Job:  [id number]

## 2010-06-30 NOTE — Discharge Summary (Signed)
NAME:  [name] Price, PHILLEY                       ACCOUNT NO.:  [id number]   MEDICAL RECORD NO.:  [id number]                   PATIENT TYPE:  INP   LOCATION:  5035                                 FACILITY:  [facility]   PHYSICIAN:  Elana Alm. Thurston Hole, M.D.              DATE OF BIRTH:  13-Dec-1940   DATE OF ADMISSION:  10/16/2002  DATE OF DISCHARGE:  10/21/2002                                 DISCHARGE SUMMARY   ADMISSION DIAGNOSES:  1. End-stage degenerative joint disease right hip.  2. Depression.  3. Anemia.   DISCHARGE DIAGNOSES:  1. End-stage degenerative joint disease right hip.  2. Depression.  3. Anemia.   HISTORY OF PRESENT ILLNESS:  The patient is a 70 year old female who has a  history of end-stage DJD of both hips.  She had a left total hip replacement  a year ago, now has right hip pain that is unrelieved by rest, anti-  inflammatories, physical therapy.  She understands risks, benefits, and  possible complications of a right total hip replacement and is without  question.   HOSPITAL COURSE:  Procedures in house on October 16, 2002, patient  underwent a right total knee replacement by Dr. Thurston Hole.  She was admitted  postoperatively for pain control, DVT prophylaxis, and physical therapy.  On  postop day #1, the patient was doing well.  Minimal pain controlled with  medications.  T-max 100.5, pulse 108, hemoglobin 8.7.  She was metabolically  stable.  INR was 1.4.   On postop day #2, the patient was symptomatic with a hemoglobin of 7.2,  feeling lightheaded and dizzy.  She was transfused 2 units of packed red  blood cells with 20 Lasix between units.   On postop day #3, pain was under control with  p.o. pain medicine.  Her  hemoglobin was 9.7 post transfusion.  Heart rate was rapid but regular.   On postop day #4, the patient was doing well.  Her T-max was 100.5.  Temperature on examination was 98.3.  She had a bowel movement.  Hemoglobin  was 9.1.  INR was 1.7.   Discharge planned for the morning.   On postop day #5, the patient was doing well, ready to go home.  T-max  101.3, now 99.3.  Surgical wound had moderate serous drainage.  She was  discharged to home.   DISCHARGE MEDICATIONS:  1. Keflex 500 mg 1 p.o. four times a day for 10 days.  2. Coumadin 4 mg 1 p.o. daily.  3. Vicodin 5/500 one to two q.4-6h. p.r.n. pain.  4. Paxil CR 1 p.o. q.a.m.  5. Iron pill 150 mg 1 p.o. b.i.d.  6. Caltrate 600 mg b.i.d.   DISCHARGE INSTRUCTIONS:  She is touchdown weightbearing.  Daily dressing  changes are recommended.  She will call for an appointment to be seen on  October 29, 2002.      Kirstin Shepperson, P.A.  Robert A. Thurston Hole, M.D.    KS/MEDQ  D:  11/25/2002  T:  11/25/2002  Job:  [id number]

## 2010-11-23 LAB — DIFFERENTIAL
Basophils Absolute: 0
Basophils Relative: 1
Eosinophils Relative: 1
Lymphocytes Relative: 33
Lymphs Abs: 1.7
Monocytes Absolute: 0.4
Neutrophils Relative %: 58

## 2010-11-23 LAB — COMPREHENSIVE METABOLIC PANEL
ALT: 14
Albumin: 3.9
Alkaline Phosphatase: 54
Potassium: 3.8
Sodium: 142
Total Protein: 6.2

## 2010-11-23 LAB — URINALYSIS, ROUTINE W REFLEX MICROSCOPIC
Nitrite: NEGATIVE
Specific Gravity, Urine: 1.007
Urobilinogen, UA: 0.2
pH: 6

## 2010-11-23 LAB — CBC
Hemoglobin: 13.2
Platelets: 204
RDW: 13.6

## 2010-11-23 LAB — POCT HEMOGLOBIN-HEMACUE: Operator id: 145061

## 2010-11-23 LAB — URINE MICROSCOPIC-ADD ON

## 2010-12-06 ENCOUNTER — Encounter (HOSPITAL_BASED_OUTPATIENT_CLINIC_OR_DEPARTMENT_OTHER): Payer: Medicare Other | Admitting: Oncology

## 2010-12-06 ENCOUNTER — Other Ambulatory Visit: Payer: Self-pay | Admitting: Oncology

## 2010-12-06 DIAGNOSIS — Z170 Estrogen receptor positive status [ER+]: Secondary | ICD-10-CM

## 2010-12-06 DIAGNOSIS — C50219 Malignant neoplasm of upper-inner quadrant of unspecified female breast: Secondary | ICD-10-CM

## 2010-12-06 LAB — CBC WITH DIFFERENTIAL/PLATELET
BASO%: 0.5 % (ref 0.0–2.0)
EOS%: 1.4 % (ref 0.0–7.0)
LYMPH%: 24.3 % (ref 14.0–49.7)
MCHC: 35.1 g/dL (ref 31.5–36.0)
MCV: 91.1 fL (ref 79.5–101.0)
MONO%: 10.2 % (ref 0.0–14.0)
Platelets: 172 10*3/uL (ref 145–400)
RBC: 4.42 10*6/uL (ref 3.70–5.45)

## 2010-12-06 LAB — COMPREHENSIVE METABOLIC PANEL
ALT: 12 U/L (ref 0–35)
AST: 19 U/L (ref 0–37)
Alkaline Phosphatase: 53 U/L (ref 39–117)
Sodium: 143 mEq/L (ref 135–145)
Total Bilirubin: 0.5 mg/dL (ref 0.3–1.2)
Total Protein: 6 g/dL (ref 6.0–8.3)

## 2010-12-06 LAB — VITAMIN D 25 HYDROXY (VIT D DEFICIENCY, FRACTURES): Vit D, 25-Hydroxy: 49 ng/mL (ref 30–89)

## 2010-12-18 ENCOUNTER — Ambulatory Visit (HOSPITAL_BASED_OUTPATIENT_CLINIC_OR_DEPARTMENT_OTHER): Payer: Medicare Other | Admitting: Oncology

## 2010-12-18 ENCOUNTER — Other Ambulatory Visit: Payer: Self-pay | Admitting: Oncology

## 2010-12-18 VITALS — BP 144/77 | HR 80 | Temp 98.5°F | Ht 63.0 in | Wt 166.1 lb

## 2010-12-18 DIAGNOSIS — C50219 Malignant neoplasm of upper-inner quadrant of unspecified female breast: Secondary | ICD-10-CM

## 2010-12-18 DIAGNOSIS — C50919 Malignant neoplasm of unspecified site of unspecified female breast: Secondary | ICD-10-CM

## 2010-12-18 DIAGNOSIS — Z170 Estrogen receptor positive status [ER+]: Secondary | ICD-10-CM

## 2010-12-18 DIAGNOSIS — E559 Vitamin D deficiency, unspecified: Secondary | ICD-10-CM

## 2010-12-18 NOTE — Progress Notes (Signed)
Hematology and Oncology Follow Up Visit  [name] Price [id number] 03-14-1940 70 y.o. 12/18/2010 7:30 PM   Principle Diagnosis: T1 C. N0, ER/PR positive breast cancer status post lumpectomy on 11/04/2006, status post radiation therapy completed 01/27/2007, on Arimidex.  Interim History:  [name] Price returns in followup. She's had a good 6 months. She recently returned from her cruise. She is doing well in general. She continues to take Erbitux without significant side effects. She is on Paxil CR for hot flash control as well. She does take her vitamin D as well as some calcium. Overall performance status good.  Medications: I have reviewed the patient's current medications.  Allergies:  Allergies  Allergen Reactions  . Codeine     REACTION: Nausea and vomiting    Past Medical History, Surgical history, Social history, and Family History were reviewed and updated.  Review of Systems: Constitutional:  Negative for fever, chills, night sweats, anorexia, weight loss, pain. Cardiovascular: no chest pain or dyspnea on exertion Respiratory: no cough, shortness of breath, or wheezing Neurological: no TIA or stroke symptoms Dermatological: negative ENT: negative Skin Gastrointestinal: no abdominal pain, change in bowel habits, or black or bloody stools Genito-Urinary: no dysuria, trouble voiding, or hematuria Hematological and Lymphatic: negative Breast: negative for breast lumps Musculoskeletal: negative Remaining ROS negative.  Physical Exam: Blood pressure 144/77, pulse 80, temperature 98.5 F (36.9 C), height 5\' 3"  (1.6 m), weight 75.342 kg (166 lb 1.6 oz). ECOG: 0 General appearance: alert Head: Normocephalic, without obvious abnormality, atraumatic Neck: no adenopathy, no carotid bruit, no JVD, supple, symmetrical, trachea midline and thyroid not enlarged, symmetric, no tenderness/mass/nodules Lymph nodes: Cervical, supraclavicular, and axillary nodes normal. Cardiac : No  heaves, thrills, bruits or murmurs Pulmonary: Normal lung sounds no rales or rhonchi Breasts: Right breast status post lumpectomy, surgical scars healed well, no evidence of local recurrence. The left breast is normal. There is no nipple retraction or skin changes. Abdomen: Bowel sounds are normal, no organomegaly or masses. Extremities   no cyanosis clubbing or edema Neuro: Is intact  Lab Results: Lab Results  Component Value Date   WBC 5.1 11/01/2006   HGB 14.1 12/06/2010   HCT 40.3 12/06/2010   MCV 91.1 12/06/2010   PLT 172 12/06/2010     Chemistry      Component Value Date/Time   NA 143 12/06/2010 0942   NA 143 12/06/2010 0942   K 3.9 12/06/2010 0942   K 3.9 12/06/2010 0942   CL 107 12/06/2010 0942   CL 107 12/06/2010 0942   CO2 25 12/06/2010 0942   CO2 25 12/06/2010 0942   BUN 20 12/06/2010 0942   BUN 20 12/06/2010 0942   CREATININE 0.66 12/06/2010 0942   CREATININE 0.66 12/06/2010 0942      Component Value Date/Time   CALCIUM 9.0 12/06/2010 0942   CALCIUM 9.0 12/06/2010 0942   ALKPHOS 53 12/06/2010 0942   ALKPHOS 53 12/06/2010 0942   AST 19 12/06/2010 0942   AST 19 12/06/2010 0942   ALT 12 12/06/2010 0942   ALT 12 12/06/2010 0942   BILITOT 0.5 12/06/2010 0942   BILITOT 0.5 12/06/2010 4098       Radiological Studies: chest X-ray None  Impression and Plan: T1 C. N0 ER/PR positive breast cancer on Arimidex. Patient is doing well. I reviewed her bone density test from August of 2012. This shows a T score of -1.4 in the spine which is fairly stable. The T score in the radius and ulna were  in the osteoporotic range of approximately -3.9 and -4.3. The previous bone density test included the femur which was not included on this exam.  I discussed these results with [name] Price. I am inclined to stop her right axilla and start her on tamoxifen. She has no contraindications to being on tamoxifen. She is at low risk for blood clots and/or uterine cancer.  I will plan  to see her at her in 6 months with appropriate imaging studies  Spent more than half the time coordinating care.    Pierce Crane, MD 11/5/20127:30 PM

## 2011-06-13 ENCOUNTER — Telehealth: Payer: Self-pay | Admitting: Oncology

## 2011-06-13 NOTE — Telephone Encounter (Signed)
lmonvm adviisng the pt that her may appts are cancelled and have been r/s to June due to the md will be out of the office

## 2011-06-22 ENCOUNTER — Ambulatory Visit: Payer: Medicare Other | Admitting: Oncology

## 2011-07-23 ENCOUNTER — Other Ambulatory Visit (HOSPITAL_BASED_OUTPATIENT_CLINIC_OR_DEPARTMENT_OTHER): Payer: Medicare Other | Admitting: Lab

## 2011-07-23 DIAGNOSIS — E559 Vitamin D deficiency, unspecified: Secondary | ICD-10-CM | POA: Diagnosis not present

## 2011-07-23 DIAGNOSIS — C50919 Malignant neoplasm of unspecified site of unspecified female breast: Secondary | ICD-10-CM

## 2011-07-23 LAB — CBC WITH DIFFERENTIAL/PLATELET
BASO%: 1 % (ref 0.0–2.0)
Basophils Absolute: 0 10*3/uL (ref 0.0–0.1)
EOS%: 1 % (ref 0.0–7.0)
HGB: 13.3 g/dL (ref 11.6–15.9)
MCH: 31.3 pg (ref 25.1–34.0)
RDW: 13.6 % (ref 11.2–14.5)
lymph#: 1 10*3/uL (ref 0.9–3.3)

## 2011-07-24 LAB — COMPREHENSIVE METABOLIC PANEL
ALT: 9 U/L (ref 0–35)
BUN: 22 mg/dL (ref 6–23)
CO2: 26 mEq/L (ref 19–32)
Calcium: 8.8 mg/dL (ref 8.4–10.5)
Chloride: 106 mEq/L (ref 96–112)
Creatinine, Ser: 0.64 mg/dL (ref 0.50–1.10)

## 2011-07-24 LAB — VITAMIN D 25 HYDROXY (VIT D DEFICIENCY, FRACTURES): Vit D, 25-Hydroxy: 41 ng/mL (ref 30–89)

## 2011-07-30 ENCOUNTER — Ambulatory Visit (HOSPITAL_BASED_OUTPATIENT_CLINIC_OR_DEPARTMENT_OTHER): Payer: Medicare Other | Admitting: Oncology

## 2011-07-30 VITALS — BP 101/62 | HR 99 | Temp 98.6°F | Ht 63.0 in | Wt 172.9 lb

## 2011-07-30 DIAGNOSIS — C50919 Malignant neoplasm of unspecified site of unspecified female breast: Secondary | ICD-10-CM

## 2011-07-30 DIAGNOSIS — Z170 Estrogen receptor positive status [ER+]: Secondary | ICD-10-CM

## 2011-07-30 DIAGNOSIS — C50219 Malignant neoplasm of upper-inner quadrant of unspecified female breast: Secondary | ICD-10-CM

## 2011-07-30 DIAGNOSIS — E559 Vitamin D deficiency, unspecified: Secondary | ICD-10-CM

## 2011-07-30 NOTE — Progress Notes (Signed)
  Hematology and Oncology Follow Up Visit  [name] Price [id number] 04-Jan-1941 71 y.o. 07/30/2011 1:56 PM   Principle Diagnosis: T1 C. N0, ER/PR positive breast cancer status post lumpectomy on 11/04/2006, status post radiation therapy completed 01/27/2007, previously on Arimidex. Now on tamoxifen,   Interim History:  [name] Price returns in followup. She's had a good 6 months. She recently returned from her cruise. She is doing well in general. She continues to take Arimidex without significant side effects. She is on Paxil CR for hot flash control as well. She does take her vitamin D as well as some calcium. Overall performance status good.  Medications: I have reviewed the patient's current medications.  Allergies:  Allergies  Allergen Reactions  . Codeine     REACTION: Nausea and vomiting    Past Medical History, Surgical history, Social history, and Family History were reviewed and updated.  Review of Systems: Constitutional:  Negative for fever, chills, night sweats, anorexia, weight loss, pain. Cardiovascular: no chest pain or dyspnea on exertion Respiratory: no cough, shortness of breath, or wheezing Neurological: no TIA or stroke symptoms Dermatological: negative ENT: negative Skin Gastrointestinal: no abdominal pain, change in bowel habits, or black or bloody stools Genito-Urinary: no dysuria, trouble voiding, or hematuria Hematological and Lymphatic: negative Breast: negative for breast lumps Musculoskeletal: negative Remaining ROS negative.  Physical Exam: Blood pressure 101/62, pulse 99, temperature 98.6 F (37 C), height 5\' 3"  (1.6 m), weight 172 lb 14.4 oz (78.427 kg). ECOG: 0 General appearance: alert Head: Normocephalic, without obvious abnormality, atraumatic Neck: no adenopathy, no carotid bruit, no JVD, supple, symmetrical, trachea midline and thyroid not enlarged, symmetric, no tenderness/mass/nodules Lymph nodes: Cervical, supraclavicular, and  axillary nodes normal. Cardiac : No heaves, thrills, bruits or murmurs Pulmonary: Normal lung sounds no rales or rhonchi Breasts: Right breast status post lumpectomy, surgical scars healed well, no evidence of local recurrence. The left breast is normal. There is no nipple retraction or skin changes. Abdomen: Bowel sounds are normal, no organomegaly or masses. Extremities   no cyanosis clubbing or edema Neuro: Is intact  Lab Results: Lab Results  Component Value Date   WBC 4.0 07/23/2011   HGB 13.3 07/23/2011   HCT 39.7 07/23/2011   MCV 93.7 07/23/2011   PLT 157 07/23/2011     Chemistry      Component Value Date/Time   NA 141 07/23/2011 1010   K 4.8 07/23/2011 1010   CL 106 07/23/2011 1010   CO2 26 07/23/2011 1010   BUN 22 07/23/2011 1010   CREATININE 0.64 07/23/2011 1010      Component Value Date/Time   CALCIUM 8.8 07/23/2011 1010   ALKPHOS 33* 07/23/2011 1010   AST 18 07/23/2011 1010   ALT 9 07/23/2011 1010   BILITOT 0.7 07/23/2011 1010       Radiological Studies: chest X-ray None  Impression and Plan: T1 C. N0 ER/PR positive breast cancer on on tamoxifen. She seems to be tolerating this well. She has had some hot flashes. Her most recent mammogram was done at Dr. Isaiah Serge the office.   I will plan to see her at her in 6 months with appropriate imaging studies  Spent more than half the time coordinating care.    Pierce Crane, MD 6/17/20131:56 PM

## 2011-07-31 ENCOUNTER — Telehealth: Payer: Self-pay | Admitting: *Deleted

## 2011-07-31 NOTE — Telephone Encounter (Signed)
mailed out calendar to inform the patient of her one year appointment with the labs one week before the md appointment

## 2011-10-17 DIAGNOSIS — Z853 Personal history of malignant neoplasm of breast: Secondary | ICD-10-CM | POA: Diagnosis not present

## 2011-11-16 DIAGNOSIS — L820 Inflamed seborrheic keratosis: Secondary | ICD-10-CM | POA: Diagnosis not present

## 2011-11-16 DIAGNOSIS — D235 Other benign neoplasm of skin of trunk: Secondary | ICD-10-CM | POA: Diagnosis not present

## 2011-11-16 DIAGNOSIS — L570 Actinic keratosis: Secondary | ICD-10-CM | POA: Diagnosis not present

## 2011-12-28 DIAGNOSIS — Z124 Encounter for screening for malignant neoplasm of cervix: Secondary | ICD-10-CM | POA: Diagnosis not present

## 2011-12-28 DIAGNOSIS — Z23 Encounter for immunization: Secondary | ICD-10-CM | POA: Diagnosis not present

## 2012-01-02 ENCOUNTER — Other Ambulatory Visit: Payer: Self-pay | Admitting: Physician Assistant

## 2012-01-17 ENCOUNTER — Telehealth: Payer: Self-pay | Admitting: *Deleted

## 2012-01-17 NOTE — Telephone Encounter (Signed)
Mailed out calendar to inform the patient of the new date and time of the md only appointment lab only appointment stayed the same

## 2012-02-28 ENCOUNTER — Encounter (INDEPENDENT_AMBULATORY_CARE_PROVIDER_SITE_OTHER): Payer: Self-pay | Admitting: General Surgery

## 2012-02-28 NOTE — Progress Notes (Signed)
Faxed on 02/27/12 signed order by Dr. Ingram for prosthesis replacement to the attention of [facility]. Fax #  [phone]. Confirmation received. 

## 2012-04-29 ENCOUNTER — Telehealth: Payer: Self-pay | Admitting: *Deleted

## 2012-04-29 NOTE — Telephone Encounter (Signed)
Received call from patient to reschedule her appt. Confirmed appt. For 08/06/12 with Bernell List.  Then will become Dr. Welton Flakes.

## 2012-06-20 ENCOUNTER — Telehealth: Payer: Self-pay | Admitting: Medical Oncology

## 2012-06-20 NOTE — Telephone Encounter (Signed)
Pt LVMOM. Her PCP wants to prescribe paxil for her but is asking to check with Dr Welton Flakes to make sure that this does not interefe with the "cancer drug." Mssg sent for review to MD.  Patient is on tamoxifen 20 mg.   Next sched appt 08/06/09 first time with Dr Welton Flakes.

## 2012-06-23 NOTE — Telephone Encounter (Signed)
Please let patient know that we prefer a different drug.  Her PCP may want to consider effexor XR

## 2012-07-08 ENCOUNTER — Telehealth: Payer: Self-pay | Admitting: *Deleted

## 2012-07-08 NOTE — Telephone Encounter (Signed)
Received call from patient stating she is going to be out of town on 08/06/12 and needs to reschedule.  Confirmed appt. For 08/07/12 at 1230.

## 2012-07-25 ENCOUNTER — Other Ambulatory Visit: Payer: Medicare Other | Admitting: Lab

## 2012-08-01 ENCOUNTER — Ambulatory Visit: Payer: Medicare Other | Admitting: Oncology

## 2012-08-06 ENCOUNTER — Ambulatory Visit: Payer: Medicare Other | Admitting: Oncology

## 2012-08-07 ENCOUNTER — Ambulatory Visit (HOSPITAL_BASED_OUTPATIENT_CLINIC_OR_DEPARTMENT_OTHER): Payer: Medicare Other | Admitting: Adult Health

## 2012-08-07 ENCOUNTER — Telehealth: Payer: Self-pay | Admitting: *Deleted

## 2012-08-07 ENCOUNTER — Encounter: Payer: Self-pay | Admitting: Adult Health

## 2012-08-07 ENCOUNTER — Ambulatory Visit (HOSPITAL_BASED_OUTPATIENT_CLINIC_OR_DEPARTMENT_OTHER): Payer: Medicare Other | Admitting: Lab

## 2012-08-07 VITALS — BP 129/79 | HR 87 | Temp 98.8°F | Resp 20 | Ht 63.0 in | Wt 173.9 lb

## 2012-08-07 DIAGNOSIS — E559 Vitamin D deficiency, unspecified: Secondary | ICD-10-CM | POA: Diagnosis not present

## 2012-08-07 DIAGNOSIS — C50919 Malignant neoplasm of unspecified site of unspecified female breast: Secondary | ICD-10-CM | POA: Diagnosis not present

## 2012-08-07 LAB — COMPREHENSIVE METABOLIC PANEL (CC13)
Albumin: 3.8 g/dL (ref 3.5–5.0)
BUN: 10.8 mg/dL (ref 7.0–26.0)
Calcium: 9.2 mg/dL (ref 8.4–10.4)
Chloride: 107 mEq/L (ref 98–109)
Glucose: 85 mg/dl (ref 70–140)
Potassium: 4 mEq/L (ref 3.5–5.1)
Total Protein: 6.5 g/dL (ref 6.4–8.3)

## 2012-08-07 LAB — CBC WITH DIFFERENTIAL/PLATELET
Basophils Absolute: 0 10*3/uL (ref 0.0–0.1)
Eosinophils Absolute: 0 10*3/uL (ref 0.0–0.5)
HGB: 13.8 g/dL (ref 11.6–15.9)
MONO#: 0.4 10*3/uL (ref 0.1–0.9)
NEUT#: 2.8 10*3/uL (ref 1.5–6.5)
RDW: 13.7 % (ref 11.2–14.5)
WBC: 4.5 10*3/uL (ref 3.9–10.3)
lymph#: 1.2 10*3/uL (ref 0.9–3.3)

## 2012-08-07 NOTE — Patient Instructions (Addendum)
Doing well.  No sign of recurrence.  We will stop anti-estrogen therapy.  Please call us if you have any questions or concerns.  We will see you back in 1 year.

## 2012-08-07 NOTE — Telephone Encounter (Signed)
appts made and printed...td 

## 2012-08-07 NOTE — Progress Notes (Signed)
OFFICE PROGRESS NOTE  CC**  [name] Boyden, MD [address] [place] [address]  DIAGNOSIS: 72 year old female with history of stage IA ER positive, PR negative, HER-2neu negative right breast cancer diagnosed in September 2008.    PRIOR THERAPY: 1. Patient underwent mammogram in August 2008 that found an abnormality in the right breast.  MRI showed a small 8 x 5 x 10mm nodule in the upper inner quadrant of the right breast.  Biopsy showed invasive ductal carcinoma, ER 85%, PR negative, HER-2/neu negative with a Ki-67 of 19%.   2. Patient underwent lumpectomy on 11/04/2006 and a grade II 0.7cm tumor was removed with negative margins.  2 sentinel nodes were negative for metastatic disease.  A bone scan in 11/2006 was negative for osseous metastases.  3. Patient then underwent radiation therapy from 12/05/06 through 01/21/07.   4. She was placed on anti-estrogen therapy with Arimidex from 02/11/2007 to 12/2010 due to osteoporosis from a bone density in August 2012 showing a t score of -3.9 and -4.3.  She was then switched to Tamoxifen.      CURRENT THERAPY: Tamoxifen daily  INTERVAL HISTORY: [name] Price 72 y.o. female returns for evaluation of her h/o invasive ductal carcinoma of the right breast.  She is doing well today and has remained on Tamoxifen with minimal side effects.  She has completed 5 years of therapy and is ready to stop the Tamoxifen.  She is exercising and has a weight loss plan.  Her health maintenance was update below, a 10 point ROS is negative.    MEDICAL HISTORY:No past medical history on file.  ALLERGIES:  is allergic to codeine.  MEDICATIONS:  Current Outpatient Prescriptions  Medication Sig Dispense Refill  . calcium carbonate (OS-CAL) 600 MG TABS Take 600 mg by mouth 2 (two) times daily with a meal.      . cholecalciferol (VITAMIN D) 1000 UNITS tablet Take 2,000 Units by mouth daily.      . Evening Primrose Oil 1000 MG CAPS Take 1,000 mg by  mouth.      . naproxen sodium (ANAPROX) 220 MG tablet Take 220 mg by mouth 2 (two) times daily with a meal.      . PARoxetine (PAXIL-CR) 25 MG 24 hr tablet Take 25 mg by mouth every morning.      . tamoxifen (NOLVADEX) 20 MG tablet TAKE 1 TABLET DAILY  90 tablet  3  . vitamin E 400 UNIT capsule Take 400 Units by mouth daily.       No current facility-administered medications for this visit.    SURGICAL HISTORY: No past surgical history on file.  REVIEW OF SYSTEMS:   General: fatigue (-), night sweats (-), fever (-), pain (-) Lymph: palpable nodes (-) HEENT: vision changes (-), mucositis (-), gum bleeding (-), epistaxis (-) Cardiovascular: chest pain (-), palpitations (-) Pulmonary: shortness of breath (-), dyspnea on exertion (-), cough (-), hemoptysis (-) GI:  Early satiety (-), melena (-), dysphagia (-), nausea/vomiting (-), diarrhea (-) GU: dysuria (-), hematuria (-), incontinence (-) Musculoskeletal: joint swelling (-), joint pain (-), back pain (-) Neuro: weakness (-), numbness (-), headache (-), confusion (-) Skin: Rash (-), lesions (-), dryness (-) Psych: depression (-), suicidal/homicidal ideation (-), feeling of hopelessness (-)    HEALTH MAINTENANCE:  Mammogram 10/2011 Colonoscopy 12/2008 Bone Density 09/2010 Pap Smear 10/2011 Eye Exam 2013 Vitamin D 11/2010 Lipid Panel not recently checked.   PHYSICAL EXAMINATION: Blood pressure 129/79, pulse 87, temperature  98.8 F (37.1 C), temperature source Oral, resp. rate 20, height 5\' 3"  (1.6 m), weight 173 lb 14.4 oz (78.881 kg). Body mass index is 30.81 kg/(m^2). General: Patient is a well appearing female in no acute distress HEENT: PERRLA, sclerae anicteric no conjunctival pallor, MMM Neck: supple, no palpable adenopathy Lungs: clear to auscultation bilaterally, no wheezes, rhonchi, or rales Cardiovascular: regular rate rhythm, S1, S2, no murmurs, rubs or gallops Abdomen: Soft, non-tender, non-distended, normoactive  bowel sounds, no HSM Extremities: warm and well perfused, no clubbing, cyanosis, or edema Skin: No rashes or lesions Neuro: Non-focal Breasts: Right lumpectomy site well healed, no nodularity, masses in right breast as well as skin changes.  No masses, nodules or skin changes in left breast.   ECOG PERFORMANCE STATUS: 1 - Symptomatic but completely ambulatory      LABORATORY DATA: Lab Results  Component Value Date   WBC 4.5 08/07/2012   HGB 13.8 08/07/2012   HCT 41.3 08/07/2012   MCV 92.8 08/07/2012   PLT 147 08/07/2012      Chemistry      Component Value Date/Time   NA 141 08/07/2012 1350   NA 141 07/23/2011 1010   K 4.0 08/07/2012 1350   K 4.8 07/23/2011 1010   CL 106 07/23/2011 1010   CO2 28 08/07/2012 1350   CO2 26 07/23/2011 1010   BUN 10.8 08/07/2012 1350   BUN 22 07/23/2011 1010   CREATININE 0.7 08/07/2012 1350   CREATININE 0.64 07/23/2011 1010      Component Value Date/Time   CALCIUM 9.2 08/07/2012 1350   CALCIUM 8.8 07/23/2011 1010   ALKPHOS 37* 08/07/2012 1350   ALKPHOS 33* 07/23/2011 1010   AST 23 08/07/2012 1350   AST 18 07/23/2011 1010   ALT 11 08/07/2012 1350   ALT 9 07/23/2011 1010   BILITOT 0.73 08/07/2012 1350   BILITOT 0.7 07/23/2011 1010       RADIOGRAPHIC STUDIES:  No results found.  ASSESSMENT: 72 year old female with  1.  history of stage IA ER positive, PR negative, HER-2neu negative right breast cancer diagnosed in September 2008. She has underwent lumpectomy, radiation, and five year of adjuvant anti-estrogen therapy.  Her health maintenance is up to date.   2. She evidently has osteoporosis and is not on bisphosphanate therapy despite being on aromatase inhibitors.  We will call [facility] to get her bone density records and prescribe Fosamax if needed.     PLAN:  1. Doing well.  No sign of recurrence.  She will stop the tamoxifen, continue with self breast exams, her annual mammograms, exercise, healthy diet.  2. We will call her once we have the results of  her bone density.  For now we recommended she continue calcium and vitamin d supplementation along with exercise.   3.  She will return in 1 year.    All questions were answered. The patient knows to call the clinic with any problems, questions or concerns. We can certainly see the patient much sooner if necessary.  I spent 40 minutes counseling the patient face to face. The total time spent in the appointment was 60 minutes.   Cherie Ouch Lyn Hollingshead, NP Medical Oncology [facility] Phone: [phone] 08/08/2012, 8:59 AM

## 2012-08-24 NOTE — Progress Notes (Signed)
ATTENDING'S ATTESTATION:  I personally reviewed patient's chart, examined patient myself, formulated the treatment plan as followed.    L. Case was reviewed again with the patient. Overall patient is doing well she has no signs of recurrence she is going to continue discontinued tamoxifen as she has completed her course. I have suggested that she continue doing self breast examinations receive annual mammograms as well as continue healthy lifestyle including exercise and healthy diet. She will continue to see [place] on a yearly basis.  Drue Second, MD Medical/Oncology [facility] [phone] (beeper) [phone] (Office)

## 2012-09-15 DIAGNOSIS — H2510 Age-related nuclear cataract, unspecified eye: Secondary | ICD-10-CM | POA: Diagnosis not present

## 2012-10-17 DIAGNOSIS — M810 Age-related osteoporosis without current pathological fracture: Secondary | ICD-10-CM | POA: Diagnosis not present

## 2012-10-17 DIAGNOSIS — Z853 Personal history of malignant neoplasm of breast: Secondary | ICD-10-CM | POA: Diagnosis not present

## 2012-12-29 DIAGNOSIS — N959 Unspecified menopausal and perimenopausal disorder: Secondary | ICD-10-CM | POA: Diagnosis not present

## 2012-12-29 DIAGNOSIS — N393 Stress incontinence (female) (male): Secondary | ICD-10-CM | POA: Diagnosis not present

## 2012-12-30 ENCOUNTER — Telehealth (INDEPENDENT_AMBULATORY_CARE_PROVIDER_SITE_OTHER): Payer: Self-pay

## 2012-12-30 ENCOUNTER — Other Ambulatory Visit (INDEPENDENT_AMBULATORY_CARE_PROVIDER_SITE_OTHER): Payer: Self-pay | Admitting: *Deleted

## 2012-12-30 MED ORDER — UNABLE TO FIND: Status: DC

## 2012-12-30 NOTE — Telephone Encounter (Signed)
Received second to nature request for supplies. Form filled out and to Dr Derrell Lolling to sign.

## 2013-01-27 ENCOUNTER — Encounter: Payer: Self-pay | Admitting: Family Medicine

## 2013-01-27 ENCOUNTER — Ambulatory Visit (INDEPENDENT_AMBULATORY_CARE_PROVIDER_SITE_OTHER): Payer: Medicare Other | Admitting: Family Medicine

## 2013-01-27 VITALS — BP 120/80 | HR 85 | Temp 97.7°F | Wt 165.0 lb

## 2013-01-27 DIAGNOSIS — J209 Acute bronchitis, unspecified: Secondary | ICD-10-CM | POA: Diagnosis not present

## 2013-01-27 DIAGNOSIS — J069 Acute upper respiratory infection, unspecified: Secondary | ICD-10-CM | POA: Insufficient documentation

## 2013-01-27 MED ORDER — AZITHROMYCIN 250 MG PO TABS: ORAL_TABLET | ORAL | Status: DC

## 2013-01-27 NOTE — Progress Notes (Signed)
Pre-visit discussion using our clinic review tool. No additional management support is needed unless otherwise documented below in the visit note.  Cough, dec in appetite, fatigue, sweats.  Sx started about 1 week ago.  Sick contact at home.  Some wheeze.  Clear sputum.  She is some better, feels better overall, but the cough has worsened recently.  Nonsmoker.  No h/o asthma.  No h/o COPD, etc.  Off tamoxifen.    Meds, vitals, and allergies reviewed.   ROS: See HPI.  Otherwise, noncontributory.  GEN: nad, alert and oriented HEENT: mucous membranes moist, tm w/o erythema, nasal exam w/o erythema, clear discharge noted,  OP with cobblestoning, sinuses not ttp NECK: supple w/o LA CV: rrr.   PULM: ctab except for rhonchi on the R side, upper and lower, no inc wob, no abnormal BS on the L side.  EXT: no edema SKIN: no acute rash

## 2013-01-27 NOTE — Patient Instructions (Signed)
Get some rest and drink plenty of fluids.  Start the antibiotics today.

## 2013-01-27 NOTE — Assessment & Plan Note (Signed)
With worsening cough.  Given the asymmetry on the lung exam, would treat.  Start zmax today and f/u prn.  Nonotoxic. She agrees.  Supportive care o/w.

## 2013-02-17 ENCOUNTER — Other Ambulatory Visit: Payer: Self-pay | Admitting: Family Medicine

## 2013-02-17 DIAGNOSIS — M899 Disorder of bone, unspecified: Secondary | ICD-10-CM

## 2013-02-17 DIAGNOSIS — M949 Disorder of cartilage, unspecified: Secondary | ICD-10-CM

## 2013-02-17 DIAGNOSIS — E7800 Pure hypercholesterolemia, unspecified: Secondary | ICD-10-CM

## 2013-02-24 ENCOUNTER — Other Ambulatory Visit (INDEPENDENT_AMBULATORY_CARE_PROVIDER_SITE_OTHER): Payer: Medicare Other

## 2013-02-24 DIAGNOSIS — E7800 Pure hypercholesterolemia, unspecified: Secondary | ICD-10-CM | POA: Diagnosis not present

## 2013-02-24 DIAGNOSIS — M899 Disorder of bone, unspecified: Secondary | ICD-10-CM

## 2013-02-24 DIAGNOSIS — M949 Disorder of cartilage, unspecified: Secondary | ICD-10-CM

## 2013-02-24 LAB — LIPID PANEL
CHOL/HDL RATIO: 4
CHOLESTEROL: 192 mg/dL (ref 0–200)
HDL: 52.6 mg/dL (ref >39.00)
LDL CALC: 123 mg/dL — AB (ref 0–99)
Triglycerides: 83 mg/dL (ref 0.0–149.0)
VLDL: 16.6 mg/dL (ref 0.0–40.0)

## 2013-02-25 LAB — VITAMIN D 25 HYDROXY (VIT D DEFICIENCY, FRACTURES): Vit D, 25-Hydroxy: 62 ng/mL (ref 30–89)

## 2013-03-03 ENCOUNTER — Ambulatory Visit (INDEPENDENT_AMBULATORY_CARE_PROVIDER_SITE_OTHER): Payer: Medicare Other | Admitting: Family Medicine

## 2013-03-03 ENCOUNTER — Encounter: Payer: Self-pay | Admitting: Family Medicine

## 2013-03-03 VITALS — BP 122/80 | HR 80 | Temp 98.2°F | Ht 63.0 in | Wt 168.5 lb

## 2013-03-03 DIAGNOSIS — IMO0002 Reserved for concepts with insufficient information to code with codable children: Secondary | ICD-10-CM

## 2013-03-03 DIAGNOSIS — E7800 Pure hypercholesterolemia, unspecified: Secondary | ICD-10-CM | POA: Diagnosis not present

## 2013-03-03 DIAGNOSIS — Z23 Encounter for immunization: Secondary | ICD-10-CM | POA: Diagnosis not present

## 2013-03-03 MED ORDER — SILVER SULFADIAZINE 1 % EX CREA: 1.0000 "application " | TOPICAL_CREAM | Freq: Every day | CUTANEOUS | Status: DC

## 2013-03-03 NOTE — Progress Notes (Signed)
Pre-visit discussion using our clinic review tool. No additional management support is needed unless otherwise documented below in the visit note.  On paxil for hot flashes with good effect.   No ADE on the medicine.    H/o breast cancer s/p lumpectomy and rady tx, no chemo, with routine f/u pending for later in 2015.  Sees Dr. Isaiah Blakes for mammogram and Dr. Radene Knee (re DXA) with GYN.    Grease burn on R forearm about 2 days ago.  Was cooking chicken.   H/o LDL >100.  No meds.  Diet compliance: encouraged Exercise: encouraged. Feels well o/w. No acute complaints.    PMH and SH reviewed  ROS: See HPI, otherwise noncontributory.  Meds, vitals, and allergies reviewed.   GEN: nad, alert and oriented HEENT: mucous membranes moist NECK: supple w/o LA CV: rrr PULM: ctab, no inc wob ABD: soft, +bs EXT: no edema SKIN: no acute rash but 3.5 x cm clean 2nd degree burn on R forearm.

## 2013-03-03 NOTE — Patient Instructions (Addendum)
Check with your insurance to see if they will cover the shingles and tetanus shot. Use the cream and keep the area covered (loosely) with a nonstick bandage.   Take care.  Glad to see you.

## 2013-03-04 DIAGNOSIS — IMO0002 Reserved for concepts with insufficient information to code with codable children: Secondary | ICD-10-CM | POA: Insufficient documentation

## 2013-03-04 NOTE — Assessment & Plan Note (Signed)
Appears clean, superficial, would use silvadene.  D/w pt about nonstick dressing.  Should heal well.

## 2013-03-04 NOTE — Assessment & Plan Note (Signed)
Overall acceptable levels on panel, would continue off meds.  Rec healthy diet and exercise.

## 2013-03-17 ENCOUNTER — Other Ambulatory Visit: Payer: Self-pay | Admitting: Physician Assistant

## 2013-03-17 DIAGNOSIS — D2330 Other benign neoplasm of skin of unspecified part of face: Secondary | ICD-10-CM | POA: Diagnosis not present

## 2013-03-17 DIAGNOSIS — D485 Neoplasm of uncertain behavior of skin: Secondary | ICD-10-CM | POA: Diagnosis not present

## 2013-03-17 DIAGNOSIS — B009 Herpesviral infection, unspecified: Secondary | ICD-10-CM | POA: Diagnosis not present

## 2013-03-17 DIAGNOSIS — D239 Other benign neoplasm of skin, unspecified: Secondary | ICD-10-CM | POA: Diagnosis not present

## 2013-05-11 ENCOUNTER — Telehealth: Payer: Self-pay | Admitting: Oncology

## 2013-05-11 NOTE — Telephone Encounter (Signed)
returned pt call and advised on June appt....pt ok and aware

## 2013-05-21 ENCOUNTER — Telehealth: Payer: Self-pay | Admitting: Adult Health

## 2013-05-21 NOTE — Telephone Encounter (Signed)
per LC to move appt to following week/cld and spoke to pt to adv of new time & date-pt understood

## 2013-07-25 ENCOUNTER — Telehealth: Payer: Self-pay | Admitting: Oncology

## 2013-07-25 NOTE — Telephone Encounter (Signed)
HOLIDAY - 7/3 MOVED TO 7/9. LMONVM FOR PT AND MAILED SCHEDULE.

## 2013-08-07 ENCOUNTER — Other Ambulatory Visit: Payer: Medicare Other

## 2013-08-07 ENCOUNTER — Ambulatory Visit: Payer: Medicare Other | Admitting: Adult Health

## 2013-08-14 ENCOUNTER — Ambulatory Visit: Payer: Medicare Other | Admitting: Adult Health

## 2013-08-14 ENCOUNTER — Other Ambulatory Visit: Payer: Medicare Other

## 2013-08-19 ENCOUNTER — Other Ambulatory Visit: Payer: Self-pay | Admitting: *Deleted

## 2013-08-19 DIAGNOSIS — C50919 Malignant neoplasm of unspecified site of unspecified female breast: Secondary | ICD-10-CM

## 2013-08-20 ENCOUNTER — Ambulatory Visit (HOSPITAL_BASED_OUTPATIENT_CLINIC_OR_DEPARTMENT_OTHER): Payer: Medicare Other | Admitting: Adult Health

## 2013-08-20 ENCOUNTER — Encounter: Payer: Self-pay | Admitting: Adult Health

## 2013-08-20 ENCOUNTER — Other Ambulatory Visit (HOSPITAL_BASED_OUTPATIENT_CLINIC_OR_DEPARTMENT_OTHER): Payer: Medicare Other

## 2013-08-20 VITALS — BP 124/75 | HR 82 | Temp 98.7°F | Resp 18 | Ht 63.0 in | Wt 161.2 lb

## 2013-08-20 DIAGNOSIS — C50919 Malignant neoplasm of unspecified site of unspecified female breast: Secondary | ICD-10-CM

## 2013-08-20 DIAGNOSIS — Z853 Personal history of malignant neoplasm of breast: Secondary | ICD-10-CM

## 2013-08-20 LAB — CBC WITH DIFFERENTIAL/PLATELET
BASO%: 0.9 % (ref 0.0–2.0)
Basophils Absolute: 0 10*3/uL (ref 0.0–0.1)
EOS ABS: 0 10*3/uL (ref 0.0–0.5)
EOS%: 0.9 % (ref 0.0–7.0)
HEMATOCRIT: 41.6 % (ref 34.8–46.6)
HGB: 13.9 g/dL (ref 11.6–15.9)
LYMPH#: 1.3 10*3/uL (ref 0.9–3.3)
LYMPH%: 25.2 % (ref 14.0–49.7)
MCH: 30.9 pg (ref 25.1–34.0)
MCHC: 33.4 g/dL (ref 31.5–36.0)
MCV: 92.6 fL (ref 79.5–101.0)
MONO#: 0.5 10*3/uL (ref 0.1–0.9)
MONO%: 9.2 % (ref 0.0–14.0)
NEUT%: 63.8 % (ref 38.4–76.8)
NEUTROS ABS: 3.3 10*3/uL (ref 1.5–6.5)
Platelets: 176 10*3/uL (ref 145–400)
RBC: 4.49 10*6/uL (ref 3.70–5.45)
RDW: 13.8 % (ref 11.2–14.5)
WBC: 5.2 10*3/uL (ref 3.9–10.3)

## 2013-08-20 LAB — COMPREHENSIVE METABOLIC PANEL (CC13)
ALT: 14 U/L (ref 0–55)
AST: 25 U/L (ref 5–34)
Albumin: 3.8 g/dL (ref 3.5–5.0)
Alkaline Phosphatase: 48 U/L (ref 40–150)
Anion Gap: 8 mEq/L (ref 3–11)
BUN: 23.2 mg/dL (ref 7.0–26.0)
CHLORIDE: 106 meq/L (ref 98–109)
CO2: 27 mEq/L (ref 22–29)
Calcium: 9.5 mg/dL (ref 8.4–10.4)
Creatinine: 0.7 mg/dL (ref 0.6–1.1)
Glucose: 85 mg/dl (ref 70–140)
Potassium: 4.1 mEq/L (ref 3.5–5.1)
Sodium: 141 mEq/L (ref 136–145)
TOTAL PROTEIN: 6.2 g/dL — AB (ref 6.4–8.3)
Total Bilirubin: 0.78 mg/dL (ref 0.20–1.20)

## 2013-08-20 NOTE — Patient Instructions (Signed)
You are doing well.  Continue with healthy diet, exercise, and monthly self breast exams.  We will see you back in one year.    Breast Self-Awareness Practicing breast self-awareness may pick up problems early, prevent significant medical complications, and possibly save your life. By practicing breast self-awareness, you can become familiar with how your breasts look and feel and if your breasts are changing. This allows you to notice changes early. It can also offer you some reassurance that your breast health is good. One way to learn what is normal for your breasts and whether your breasts are changing is to do a breast self-exam. If you find a lump or something that was not present in the past, it is best to contact your caregiver right away. Other findings that should be evaluated by your caregiver include nipple discharge, especially if it is bloody; skin changes or reddening; areas where the skin seems to be pulled in (retracted); or new lumps and bumps. Breast pain is seldom associated with cancer (malignancy), but should also be evaluated by a caregiver. HOW TO PERFORM A BREAST SELF-EXAM The best time to examine your breasts is 5-7 days after your menstrual period is over. During menstruation, the breasts are lumpier, and it may be more difficult to pick up changes. If you do not menstruate, have reached menopause, or had your uterus removed (hysterectomy), you should examine your breasts at regular intervals, such as monthly. If you are breastfeeding, examine your breasts after a feeding or after using a breast pump. Breast implants do not decrease the risk for lumps or tumors, so continue to perform breast self-exams as recommended. Talk to your caregiver about how to determine the difference between the implant and breast tissue. Also, talk about the amount of pressure you should use during the exam. Over time, you will become more familiar with the variations of your breasts and more comfortable  with the exam. A breast self-exam requires you to remove all your clothes above the waist. 1. Look at your breasts and nipples. Stand in front of a mirror in a room with good lighting. With your hands on your hips, push your hands firmly downward. Look for a difference in shape, contour, and size from one breast to the other (asymmetry). Asymmetry includes puckers, dips, or bumps. Also, look for skin changes, such as reddened or scaly areas on the breasts. Look for nipple changes, such as discharge, dimpling, repositioning, or redness. 2. Carefully feel your breasts. This is best done either in the shower or tub while using soapy water or when flat on your back. Place the arm (on the side of the breast you are examining) above your head. Use the pads (not the fingertips) of your three middle fingers on your opposite hand to feel your breasts. Start in the underarm area and use  inch (2 cm) overlapping circles to feel your breast. Use 3 different levels of pressure (light, medium, and firm pressure) at each circle before moving to the next circle. The light pressure is needed to feel the tissue closest to the skin. The medium pressure will help to feel breast tissue a little deeper, while the firm pressure is needed to feel the tissue close to the ribs. Continue the overlapping circles, moving downward over the breast until you feel your ribs below your breast. Then, move one finger-width towards the center of the body. Continue to use the  inch (2 cm) overlapping circles to feel your breast as you move  slowly up toward the collar bone (clavicle) near the base of the neck. Continue the up and down exam using all 3 pressures until you reach the middle of the chest. Do this with each breast, carefully feeling for lumps or changes. 3.  Keep a written record with breast changes or normal findings for each breast. By writing this information down, you do not need to depend only on memory for size, tenderness, or  location. Write down where you are in your menstrual cycle, if you are still menstruating. Breast tissue can have some lumps or thick tissue. However, see your caregiver if you find anything that concerns you.  SEEK MEDICAL CARE IF:  You see a change in shape, contour, or size of your breasts or nipples.   You see skin changes, such as reddened or scaly areas on the breasts or nipples.   You have an unusual discharge from your nipples.   You feel a new lump or unusually thick areas.  Document Released: 01/29/2005 Document Revised: 01/16/2012 Document Reviewed: 05/16/2011 [facility] Patient Information 2015 [place], [place]. This information is not intended to replace advice given to you by your health care provider. Make sure you discuss any questions you have with your health care provider.

## 2013-08-20 NOTE — Progress Notes (Signed)
OFFICE PROGRESS NOTE  CC**  [name] Stain, MD [place] [address]  DIAGNOSIS: 73 year old female with history of stage IA ER positive, PR negative, HER-2neu negative right breast cancer diagnosed in September 2008.    PRIOR THERAPY: 1. Patient underwent mammogram in August 2008 that found an abnormality in the right breast.  MRI showed a small 8 x 5 x 82m nodule in the upper inner quadrant of the right breast.  Biopsy showed invasive ductal carcinoma, ER 85%, PR negative, HER-2/neu negative with a Ki-67 of 19%.   2. Patient underwent lumpectomy on 11/04/2006 and a grade II 0.7cm tumor was removed with negative margins.  2 sentinel nodes were negative for metastatic disease.  A bone scan in 11/2006 was negative for osseous metastases.  3. Patient then underwent radiation therapy from 12/05/06 through 01/21/07.   4. She was placed on anti-estrogen therapy with Arimidex from 02/11/2007 to 12/2010 due to osteoporosis from a bone density in August 2012 showing a t score of -3.9 and -4.3.  She was then switched to Tamoxifen and stopped this in 2014.      CURRENT THERAPY: Observation  INTERVAL HISTORY: [name] EVORA89y.o. female returns for evaluation of her h/o invasive ductal carcinoma of the right breast.  She stopped tamoxifen last year and continues to do well.  She is trying to lose weight and has gone down a couple of sizes.  She denies fevers, chills, new pain, headaches, cough, shortness of breath, nausea, vomiting, constipation, diarrhea, numbness, or any further concerns.  We updated her health maintenance below.    MEDICAL HISTORY: Past Medical History  Diagnosis Date  . Breast cancer     s/p lumpectomy and radiation, no chemo  . Osteopenia   . Hyperlipidemia   . Arthritis   . History of chicken pox   . UTI (urinary tract infection)     ALLERGIES:  is allergic to codeine.  MEDICATIONS:  Current Outpatient Prescriptions  Medication Sig  Dispense Refill  . b complex vitamins capsule Take 1 capsule by mouth daily.      . calcium carbonate (OS-CAL) 600 MG TABS Take 600 mg by mouth 2 (two) times daily with a meal.      . cholecalciferol (VITAMIN D) 1000 UNITS tablet Take 2,000 Units by mouth daily.      . Garcinia Cambogia-Chromium 500-200 MG-MCG TABS Take 1 tablet by mouth daily.      .Marland KitchenUNABLE TO FIND 4 scoop every morning. Med Name - Almased      . Green Coffee Bean 400 MG CAPS Take 1 capsule by mouth daily.      . naproxen sodium (ANAPROX) 220 MG tablet Take 220 mg by mouth 2 (two) times daily as needed (with meals).       . silver sulfADIAZINE (SILVADENE) 1 % cream Apply 1 application topically daily.  50 g  0   No current facility-administered medications for this visit.    SURGICAL HISTORY:  Past Surgical History  Procedure Laterality Date  . Oophorectomy Left   . Total hip arthroplasty Bilateral     (2) Two  . Breast surgery Right   . Total shoulder replacement      REVIEW OF SYSTEMS:   A 10 point review of systems was conducted and is otherwise negative except for what is noted above.     HEALTH MAINTENANCE:  Mammogram 10/2012 Colonoscopy 12/2008 Bone Density 09/2010 Pap Smear 10/2011 Eye Exam  2014   PHYSICAL EXAMINATION: Blood pressure 124/75, pulse 82, temperature 98.7 F (37.1 C), temperature source Oral, resp. rate 18, height _0  (1.6 m), weight 161 lb 3.2 oz (73.12 kg). Body mass index is 28.56 kg/(m^2). General: Patient is a well appearing female in no acute distress HEENT: PERRLA, sclerae anicteric no conjunctival pallor, MMM Neck: supple, no palpable adenopathy Lungs: clear to auscultation bilaterally, no wheezes, rhonchi, or rales Cardiovascular: regular rate rhythm, S1, S2, no murmurs, rubs or gallops Abdomen: Soft, non-tender, non-distended, normoactive bowel sounds, no HSM Extremities: warm and well perfused, no clubbing, cyanosis, or edema Skin: No rashes or lesions Neuro:  Non-focal Breasts: Right lumpectomy site well healed, no nodularity, masses in right breast as well as skin changes.  No masses, nodules or skin changes in left breast.   ECOG PERFORMANCE STATUS: 1 - Symptomatic but completely ambulatory      LABORATORY DATA: Lab Results  Component Value Date   WBC 5.2 08/20/2013   HGB 13.9 08/20/2013   HCT 41.6 08/20/2013   MCV 92.6 08/20/2013   PLT 176 08/20/2013      Chemistry      Component Value Date/Time   NA 141 08/20/2013 1245   NA 141 07/23/2011 1010   K 4.1 08/20/2013 1245   K 4.8 07/23/2011 1010   CL 106 07/23/2011 1010   CO2 27 08/20/2013 1245   CO2 26 07/23/2011 1010   BUN 23.2 08/20/2013 1245   BUN 22 07/23/2011 1010   CREATININE 0.7 08/20/2013 1245   CREATININE 0.64 07/23/2011 1010      Component Value Date/Time   CALCIUM 9.5 08/20/2013 1245   CALCIUM 8.8 07/23/2011 1010   ALKPHOS 48 08/20/2013 1245   ALKPHOS 33* 07/23/2011 1010   AST 25 08/20/2013 1245   AST 18 07/23/2011 1010   ALT 14 08/20/2013 1245   ALT 9 07/23/2011 1010   BILITOT 0.78 08/20/2013 1245   BILITOT 0.7 07/23/2011 1010       RADIOGRAPHIC STUDIES:  No results found.  ASSESSMENT: 73 year old female with  1.  history of stage IA ER positive, PR negative, HER-2neu negative right breast cancer diagnosed in September 2008. She has underwent lumpectomy, radiation, and five year of adjuvant anti-estrogen therapy.  Her health maintenance is up to date.   2. She evidently has osteoporosis and is not on bisphosphanate therapy despite being on aromatase inhibitors.  We will call [facility] to get her bone density records and prescribe Fosamax if needed.     PLAN:  [name] Price is doing very well today.  She has had a mammogram and bone density, however we do not have these records from [place] to review.  We will contact them.  She has no sign of recurrence.    We reviewed survivorship.  She will continue with healthy diet, exercise, and monthly self breast exams.    [name] Price will return in 1 year for  evaluation.    All questions were answered. The patient knows to call the clinic with any problems, questions or concerns. We can certainly see the patient much sooner if necessary.  I spent 25 minutes counseling the patient face to face. The total time spent in the appointment was 30 minutes.   Minette Headland, [place] [phone] 08/22/2013, 8:48 AM

## 2013-10-27 DIAGNOSIS — Z853 Personal history of malignant neoplasm of breast: Secondary | ICD-10-CM | POA: Diagnosis not present

## 2013-11-09 DIAGNOSIS — H2510 Age-related nuclear cataract, unspecified eye: Secondary | ICD-10-CM | POA: Diagnosis not present

## 2013-11-17 ENCOUNTER — Encounter: Payer: Self-pay | Admitting: Internal Medicine

## 2013-11-21 DIAGNOSIS — Z23 Encounter for immunization: Secondary | ICD-10-CM | POA: Diagnosis not present

## 2014-01-27 ENCOUNTER — Other Ambulatory Visit: Payer: Self-pay | Admitting: Obstetrics and Gynecology

## 2014-01-27 DIAGNOSIS — Z124 Encounter for screening for malignant neoplasm of cervix: Secondary | ICD-10-CM | POA: Diagnosis not present

## 2014-01-27 DIAGNOSIS — Z13220 Encounter for screening for lipoid disorders: Secondary | ICD-10-CM | POA: Diagnosis not present

## 2014-01-27 DIAGNOSIS — Z3149 Encounter for other procreative investigation and testing: Secondary | ICD-10-CM | POA: Diagnosis not present

## 2014-01-27 DIAGNOSIS — Z131 Encounter for screening for diabetes mellitus: Secondary | ICD-10-CM | POA: Diagnosis not present

## 2014-01-27 DIAGNOSIS — Z0000 Encounter for general adult medical examination without abnormal findings: Secondary | ICD-10-CM | POA: Diagnosis not present

## 2014-01-28 ENCOUNTER — Encounter: Payer: Self-pay | Admitting: Internal Medicine

## 2014-01-29 LAB — CYTOLOGY - PAP

## 2014-02-12 HISTORY — PX: COLONOSCOPY: SHX174

## 2014-03-25 ENCOUNTER — Telehealth: Payer: Self-pay | Admitting: *Deleted

## 2014-03-25 NOTE — Telephone Encounter (Signed)
NOTIFIED DR.GUDENA'S NURSE, DIANA BURLESON,RN OF THE ABOVE REQUEST. ALSO PT. WILL BE REASSIGNED A NEW PHYSICIAN.

## 2014-03-26 ENCOUNTER — Ambulatory Visit (AMBULATORY_SURGERY_CENTER): Payer: Self-pay | Admitting: *Deleted

## 2014-03-26 VITALS — Ht 62.5 in | Wt 162.2 lb

## 2014-03-26 DIAGNOSIS — Z86010 Personal history of colonic polyps: Secondary | ICD-10-CM

## 2014-03-26 NOTE — Progress Notes (Signed)
No egg or soy allergy No home 02 use No diet pills No issues with past sedation No hx of constipation issues  Per 2010 colon w/[facility] said poor prep with movi prep. Pt states she could not drink the movi prep, that she vomited almost every single attempt of 8 oz and she was not surprised it was a poor prep because she could not keep it down at all. Did a 2 day miralax prep with pt as per 2 day instructions, but with just miralax and gatorade and no movi as pt states she could not drink that. Instructed pt to call wit questions.   [name] Price PV

## 2014-04-07 ENCOUNTER — Encounter: Payer: Self-pay | Admitting: Internal Medicine

## 2014-04-07 ENCOUNTER — Ambulatory Visit (AMBULATORY_SURGERY_CENTER): Payer: Medicare Other | Admitting: Internal Medicine

## 2014-04-07 VITALS — BP 110/63 | HR 63 | Temp 97.2°F | Resp 17 | Ht 62.0 in | Wt 162.0 lb

## 2014-04-07 DIAGNOSIS — D122 Benign neoplasm of ascending colon: Secondary | ICD-10-CM

## 2014-04-07 DIAGNOSIS — Z1211 Encounter for screening for malignant neoplasm of colon: Secondary | ICD-10-CM

## 2014-04-07 DIAGNOSIS — Z86010 Personal history of colonic polyps: Secondary | ICD-10-CM

## 2014-04-07 DIAGNOSIS — Z800 Family history of malignant neoplasm of digestive organs: Secondary | ICD-10-CM

## 2014-04-07 DIAGNOSIS — D125 Benign neoplasm of sigmoid colon: Secondary | ICD-10-CM | POA: Diagnosis not present

## 2014-04-07 DIAGNOSIS — D123 Benign neoplasm of transverse colon: Secondary | ICD-10-CM

## 2014-04-07 MED ORDER — SODIUM CHLORIDE 0.9 % IV SOLN: 500.0000 mL | INTRAVENOUS | Status: DC

## 2014-04-07 NOTE — Progress Notes (Signed)
A/ox3 pleased with MAC, report to Wendy RN 

## 2014-04-07 NOTE — Op Note (Signed)
[place]  [address] [place], [id number]   COLONOSCOPY PROCEDURE REPORT  PATIENT: [name] Price, [name] Price  MR#: [id number] BIRTHDATE: 12/08/1940 , 73  yrs. old GENDER: female ENDOSCOPIST: Jerene Bears, MD PROCEDURE DATE:  04/07/2014 PROCEDURE:   Colonoscopy with snare polypectomy First Screening Colonoscopy - Avg.  risk and is 50 yrs.  old or older - No.  Prior Negative Screening - Now for repeat screening. Less than 10 yrs Prior Negative Screening - Now for repeat screening.  Above average risk  History of Adenoma - Now for follow-up colonoscopy & has been > or = to 3 yrs.  N/A  Polyps Removed Today? Yes. ASA CLASS:   Class II INDICATIONS:patient's immediate family history of colon cancer. MEDICATIONS: Monitored anesthesia care and Propofol 350 mg IV  DESCRIPTION OF PROCEDURE:   After the risks benefits and alternatives of the procedure were thoroughly explained, informed consent was obtained.  The digital rectal exam revealed no rectal mass.   The LB [id number]  endoscope was introduced through the anus and advanced to the terminal ileum which was intubated for a short distance. No adverse events experienced.   The quality of the prep was good, using MoviPrep  The instrument was then slowly withdrawn as the colon was fully examined.  COLON FINDINGS: The examined terminal ileum appeared to be normal. Five sessile polyps ranging between 3-57mm in size were found in the ascending colon, transverse colon, and sigmoid colon. Polypectomies were performed with a cold snare.  The resection was complete, the polyp tissue was completely retrieved and sent to histology.  Retroflexed views revealed no abnormalities. The time to cecum=2 minutes 53 seconds.  Withdrawal time=15 minutes 46 seconds.  The scope was withdrawn and the procedure completed. COMPLICATIONS: There were no immediate complications.  ENDOSCOPIC IMPRESSION: 1.   The examined terminal ileum  appeared to be normal 2.   Five sessile polyps ranging between 3-88mm in size were found in the ascending colon, transverse colon, and sigmoid colon; polypectomies were performed with a cold snare  RECOMMENDATIONS: 1.  Avoid all NSAIDs for the next 2 weeks. 2.  Await pathology results 3.  Timing of repeat colonoscopy will be determined by pathology findings. 4.  You will receive a letter within 1-2 weeks with the results of your biopsy as well as final recommendations.  Please call my office if you have not received a letter after 3 weeks.  eSigned:  Jerene Bears, MD 04/07/2014 9:52 AM   cc: The Patient and Elsie Stain, MD

## 2014-04-07 NOTE — Patient Instructions (Signed)
YOU HAD AN ENDOSCOPIC PROCEDURE TODAY AT THE [facility]: Refer to the procedure report that was given to you for any specific questions about what was found during the examination.  If the procedure report does not answer your questions, please call your gastroenterologist to clarify.  If you requested that your care partner not be given the details of your procedure findings, then the procedure report has been included in a sealed envelope for you to review at your convenience later.  YOU SHOULD EXPECT: Some feelings of bloating in the abdomen. Passage of more gas than usual.  Walking can help get rid of the air that was put into your GI tract during the procedure and reduce the bloating. If you had a lower endoscopy (such as a colonoscopy or flexible sigmoidoscopy) you may notice spotting of blood in your stool or on the toilet paper. If you underwent a bowel prep for your procedure, then you may not have a normal bowel movement for a few days.  DIET: Your first meal following the procedure should be a light meal and then it is ok to progress to your normal diet.  A half-sandwich or bowl of soup is an example of a good first meal.  Heavy or fried foods are harder to digest and may make you feel nauseous or bloated.  Likewise meals heavy in dairy and vegetables can cause extra gas to form and this can also increase the bloating.  Drink plenty of fluids but you should avoid alcoholic beverages for 24 hours.  ACTIVITY: Your care partner should take you home directly after the procedure.  You should plan to take it easy, moving slowly for the rest of the day.  You can resume normal activity the day after the procedure however you should NOT DRIVE or use heavy machinery for 24 hours (because of the sedation medicines used during the test).    SYMPTOMS TO REPORT IMMEDIATELY: A gastroenterologist can be reached at any hour.  During normal business hours, 8:30 AM to 5:00 PM Monday through Friday,  call [phone].  After hours and on weekends, please call the GI answering service at [phone] who will take a message and have the physician on call contact you.   Following lower endoscopy (colonoscopy or flexible sigmoidoscopy):  Excessive amounts of blood in the stool  Significant tenderness or worsening of abdominal pains  Swelling of the abdomen that is new, acute  Fever of 100F or higher  FOLLOW UP: If any biopsies were taken you will be contacted by phone or by letter within the next 1-3 weeks.  Call your gastroenterologist if you have not heard about the biopsies in 3 weeks.  Our staff will call the home number listed on your records the next business day following your procedure to check on you and address any questions or concerns that you may have at that time regarding the information given to you following your procedure. This is a courtesy call and so if there is no answer at the home number and we have not heard from you through the emergency physician on call, we will assume that you have returned to your regular daily activities without incident.  SIGNATURES/CONFIDENTIALITY: You and/or your care partner have signed paperwork which will be entered into your electronic medical record.  These signatures attest to the fact that that the information above on your After Visit Summary has been reviewed and is understood.  Full responsibility of the confidentiality of this   discharge information lies with you and/or your care-partner.  Recommendations Next colonoscopy determined by pathology results. Avoid all NSAID's for 2 weeks (aspirin, aspirin products, and anti-inflammatory drugs). Polyp handout provided.

## 2014-04-07 NOTE — Progress Notes (Signed)
Called to room to assist during endoscopic procedure.  Patient ID and intended procedure confirmed with present staff. Received instructions for my participation in the procedure from the performing physician.  

## 2014-04-08 ENCOUNTER — Telehealth: Payer: Self-pay | Admitting: *Deleted

## 2014-04-08 NOTE — Telephone Encounter (Signed)
  Follow up Call-  Call back number 04/07/2014  Post procedure Call Back phone  # [phone]  Permission to leave phone message Yes     Patient questions:  Do you have a fever, pain , or abdominal swelling? No. Pain Score  0 *  Have you tolerated food without any problems? Yes.    Have you been able to return to your normal activities? Yes.    Do you have any questions about your discharge instructions: Diet   No. Medications  No. Follow up visit  No.  Do you have questions or concerns about your Care? No.  Actions: * If pain score is 4 or above: No action needed, pain <4.

## 2014-04-12 ENCOUNTER — Encounter: Payer: Self-pay | Admitting: Internal Medicine

## 2014-08-17 DIAGNOSIS — M545 Low back pain: Secondary | ICD-10-CM | POA: Diagnosis not present

## 2014-08-17 DIAGNOSIS — Z96643 Presence of artificial hip joint, bilateral: Secondary | ICD-10-CM | POA: Diagnosis not present

## 2014-10-29 DIAGNOSIS — M810 Age-related osteoporosis without current pathological fracture: Secondary | ICD-10-CM | POA: Diagnosis not present

## 2014-10-29 DIAGNOSIS — Z1231 Encounter for screening mammogram for malignant neoplasm of breast: Secondary | ICD-10-CM | POA: Diagnosis not present

## 2014-10-29 DIAGNOSIS — Z853 Personal history of malignant neoplasm of breast: Secondary | ICD-10-CM | POA: Diagnosis not present

## 2014-11-11 DIAGNOSIS — H2513 Age-related nuclear cataract, bilateral: Secondary | ICD-10-CM | POA: Diagnosis not present

## 2014-12-22 DIAGNOSIS — H2512 Age-related nuclear cataract, left eye: Secondary | ICD-10-CM | POA: Diagnosis not present

## 2014-12-23 ENCOUNTER — Encounter: Payer: Self-pay | Admitting: *Deleted

## 2014-12-28 ENCOUNTER — Ambulatory Visit
Admission: RE | Admit: 2014-12-28 | Discharge: 2014-12-28 | Disposition: A | Discharge status: Home / Self Care | Payer: Medicare Other | Source: Ambulatory Visit | Attending: Ophthalmology | Admitting: Ophthalmology

## 2014-12-28 ENCOUNTER — Encounter
Admission: RE | Disposition: A | Discharge status: Home / Self Care | Payer: Self-pay | Source: Ambulatory Visit | Attending: Ophthalmology

## 2014-12-28 ENCOUNTER — Ambulatory Visit: Payer: Medicare Other | Admitting: Anesthesiology

## 2014-12-28 ENCOUNTER — Encounter: Payer: Self-pay | Admitting: *Deleted

## 2014-12-28 DIAGNOSIS — Z79899 Other long term (current) drug therapy: Secondary | ICD-10-CM | POA: Insufficient documentation

## 2014-12-28 DIAGNOSIS — Z885 Allergy status to narcotic agent status: Secondary | ICD-10-CM | POA: Insufficient documentation

## 2014-12-28 DIAGNOSIS — Z853 Personal history of malignant neoplasm of breast: Secondary | ICD-10-CM | POA: Diagnosis not present

## 2014-12-28 DIAGNOSIS — Z96643 Presence of artificial hip joint, bilateral: Secondary | ICD-10-CM | POA: Diagnosis not present

## 2014-12-28 DIAGNOSIS — Z96619 Presence of unspecified artificial shoulder joint: Secondary | ICD-10-CM | POA: Diagnosis not present

## 2014-12-28 DIAGNOSIS — H2512 Age-related nuclear cataract, left eye: Secondary | ICD-10-CM | POA: Diagnosis not present

## 2014-12-28 HISTORY — PX: CATARACT EXTRACTION W/PHACO: SHX586

## 2014-12-28 SURGERY — PHACOEMULSIFICATION, CATARACT, WITH IOL INSERTION
Anesthesia: Monitor Anesthesia Care | Site: Eye | Laterality: Left | Wound class: Clean

## 2014-12-28 MED ORDER — NA CHONDROIT SULF-NA HYALURON 40-17 MG/ML IO SOLN
INTRAOCULAR | Status: DC | PRN
  Administered 2014-12-28: 1 mL via INTRAOCULAR

## 2014-12-28 MED ORDER — EPINEPHRINE HCL 1 MG/ML IJ SOLN
INTRAMUSCULAR | Status: AC
  Filled 2014-12-28: qty 1

## 2014-12-28 MED ORDER — NA CHONDROIT SULF-NA HYALURON 40-17 MG/ML IO SOLN
INTRAOCULAR | Status: AC
  Filled 2014-12-28: qty 1

## 2014-12-28 MED ORDER — EPINEPHRINE HCL 1 MG/ML IJ SOLN
INTRAOCULAR | Status: DC | PRN
  Administered 2014-12-28: 1 mL via OPHTHALMIC

## 2014-12-28 MED ORDER — CEFUROXIME OPHTHALMIC INJECTION 1 MG/0.1 ML
INJECTION | OPHTHALMIC | Status: DC | PRN
  Administered 2014-12-28: .1 mL via INTRACAMERAL

## 2014-12-28 MED ORDER — POVIDONE-IODINE 5 % OP SOLN
OPHTHALMIC | Status: AC
Start: 2014-12-28 — End: 2014-12-28
  Administered 2014-12-28: 1 via OPHTHALMIC
  Filled 2014-12-28: qty 30

## 2014-12-28 MED ORDER — MOXIFLOXACIN HCL 0.5 % OP SOLN
OPHTHALMIC | Status: DC | PRN
  Administered 2014-12-28: 1 [drp] via OPHTHALMIC

## 2014-12-28 MED ORDER — TETRACAINE HCL 0.5 % OP SOLN
OPHTHALMIC | Status: AC
  Administered 2014-12-28: 1 [drp] via OPHTHALMIC
  Filled 2014-12-28: qty 2

## 2014-12-28 MED ORDER — CEFUROXIME OPHTHALMIC INJECTION 1 MG/0.1 ML
INJECTION | OPHTHALMIC | Status: AC
  Filled 2014-12-28: qty 0.1

## 2014-12-28 MED ORDER — POVIDONE-IODINE 5 % OP SOLN
1.0000 "application " | Freq: Once | OPHTHALMIC | Status: AC
  Administered 2014-12-28: 1 via OPHTHALMIC

## 2014-12-28 MED ORDER — CARBACHOL 0.01 % IO SOLN
INTRAOCULAR | Status: DC | PRN
  Administered 2014-12-28: .5 mL via INTRAOCULAR

## 2014-12-28 MED ORDER — SODIUM CHLORIDE 0.9 % IV SOLN
INTRAVENOUS | Status: DC
  Administered 2014-12-28: 07:00:00 via INTRAVENOUS

## 2014-12-28 MED ORDER — MIDAZOLAM HCL 2 MG/2ML IJ SOLN
INTRAMUSCULAR | Status: DC | PRN
  Administered 2014-12-28: 0.5 mg via INTRAVENOUS

## 2014-12-28 MED ORDER — ARMC OPHTHALMIC DILATING GEL
OPHTHALMIC | Status: AC
  Administered 2014-12-28: 1 via OPHTHALMIC
  Filled 2014-12-28: qty 0.25

## 2014-12-28 MED ORDER — FENTANYL CITRATE (PF) 100 MCG/2ML IJ SOLN
INTRAMUSCULAR | Status: DC | PRN
  Administered 2014-12-28: 25 ug via INTRAVENOUS

## 2014-12-28 MED ORDER — MOXIFLOXACIN HCL 0.5 % OP SOLN: 1.0000 [drp] | OPHTHALMIC | Status: DC | PRN

## 2014-12-28 MED ORDER — TETRACAINE HCL 0.5 % OP SOLN
1.0000 [drp] | Freq: Once | OPHTHALMIC | Status: AC
  Administered 2014-12-28: 1 [drp] via OPHTHALMIC

## 2014-12-28 MED ORDER — ARMC OPHTHALMIC DILATING GEL
1.0000 "application " | OPHTHALMIC | Status: AC
  Administered 2014-12-28 (×2): 1 via OPHTHALMIC

## 2014-12-28 MED ORDER — MOXIFLOXACIN HCL 0.5 % OP SOLN
OPHTHALMIC | Status: AC
  Filled 2014-12-28: qty 3

## 2014-12-28 SURGICAL SUPPLY — 22 items
CANNULA ANT/CHMB 27GA (MISCELLANEOUS) ×2 IMPLANT
CUP MEDICINE 2OZ PLAST GRAD ST (MISCELLANEOUS) ×2 IMPLANT
GLOVE BIO SURGEON STRL SZ8 (GLOVE) ×2 IMPLANT
GLOVE BIOGEL M 6.5 STRL (GLOVE) ×2 IMPLANT
GLOVE SURG LX 8.0 MICRO (GLOVE) ×1
GLOVE SURG LX STRL 8.0 MICRO (GLOVE) ×1 IMPLANT
GOWN STRL REUS W/ TWL LRG LVL3 (GOWN DISPOSABLE) ×2 IMPLANT
GOWN STRL REUS W/TWL LRG LVL3 (GOWN DISPOSABLE) ×2
LENS IOL TECNIS 16.0 (Intraocular Lens) ×2 IMPLANT
LENS IOL TECNIS MONO 1P 16.0 (Intraocular Lens) ×1 IMPLANT
PACK CATARACT (MISCELLANEOUS) ×2 IMPLANT
PACK CATARACT BRASINGTON LX (MISCELLANEOUS) ×2 IMPLANT
PACK EYE AFTER SURG (MISCELLANEOUS) ×2 IMPLANT
SOL BSS BAG (MISCELLANEOUS) ×2
SOL PREP PVP 2OZ (MISCELLANEOUS) ×2
SOLUTION BSS BAG (MISCELLANEOUS) ×1 IMPLANT
SOLUTION PREP PVP 2OZ (MISCELLANEOUS) ×1 IMPLANT
SYR 3ML LL SCALE MARK (SYRINGE) ×2 IMPLANT
SYR 5ML LL (SYRINGE) ×2 IMPLANT
SYR TB 1ML 27GX1/2 LL (SYRINGE) ×2 IMPLANT
WATER STERILE IRR 1000ML POUR (IV SOLUTION) ×2 IMPLANT
WIPE NON LINTING 3.25X3.25 (MISCELLANEOUS) ×2 IMPLANT

## 2014-12-28 NOTE — Anesthesia Preprocedure Evaluation (Signed)
Anesthesia Evaluation  Patient identified by MRN, date of birth, ID band Patient awake    Reviewed: Allergy & Precautions, NPO status , Patient's Chart, lab work & pertinent test results  Airway Mallampati: III  TM Distance: <3 FB Neck ROM: Limited    Dental  (+) Upper Dentures, Partial Lower   Pulmonary neg pulmonary ROS,    Pulmonary exam normal breath sounds clear to auscultation       Cardiovascular negative cardio ROS Normal cardiovascular exam     Neuro/Psych Anxiety negative neurological ROS     GI/Hepatic negative GI ROS, Neg liver ROS,   Endo/Other  negative endocrine ROS  Renal/GU negative Renal ROS  negative genitourinary   Musculoskeletal  (+) Arthritis , Osteoarthritis,    Abdominal Normal abdominal exam  (+)   Peds negative pediatric ROS (+)  Hematology negative hematology ROS (+)   Anesthesia Other Findings   Reproductive/Obstetrics                             Anesthesia Physical Anesthesia Plan  ASA: II  Anesthesia Plan: MAC   Post-op Pain Management:    Induction: Intravenous  Airway Management Planned: Nasal Cannula  Additional Equipment:   Intra-op Plan:   Post-operative Plan:   Informed Consent: I have reviewed the patients History and Physical, chart, labs and discussed the procedure including the risks, benefits and alternatives for the proposed anesthesia with the patient or authorized representative who has indicated his/her understanding and acceptance.   Dental advisory given  Plan Discussed with: CRNA and Surgeon  Anesthesia Plan Comments:         Anesthesia Quick Evaluation

## 2014-12-28 NOTE — Discharge Instructions (Signed)
AMBULATORY SURGERY  DISCHARGE INSTRUCTIONS   1) The drugs that you were given will stay in your system until tomorrow so for the next 24 hours you should not:  A) Drive an automobile B) Make any legal decisions C) Drink any alcoholic beverage   2) You may resume regular meals tomorrow.  Today it is better to start with liquids and gradually work up to solid foods.  You may eat anything you prefer, but it is better to start with liquids, then soup and crackers, and gradually work up to solid foods.   3) Please notify your doctor immediately if you have any unusual bleeding, trouble breathing, redness and pain at the surgery site, drainage, fever, or pain not relieved by medication.    4) Additional Instructions:    Eye Surgery Discharge Instructions  Expect mild scratchy sensation or mild soreness. DO NOT RUB YOUR EYE!  The day of surgery:  Minimal physical activity, but bed rest is not required  No reading, computer work, or close hand work  No bending, lifting, or straining.  May watch TV  For 24 hours:  No driving, legal decisions, or alcoholic beverages  Safety precautions  Eat anything you prefer: It is better to start with liquids, then soup then solid foods.  _____ Eye patch should be worn until postoperative exam tomorrow.  ____ Solar shield eyeglasses should be worn for comfort in the sunlight/patch while sleeping  Resume all regular medications including aspirin or Coumadin if these were discontinued prior to surgery. You may shower, bathe, shave, or wash your hair. Tylenol may be taken for mild discomfort.  Call your doctor if you experience significant pain, nausea, or vomiting, fever > 101 or other signs of infection. [phone] or [phone] Specific instructions:  Follow-up Information    Follow up with PORFILIO,WILLIAM LOUIS, MD In 1 day.   Specialty:  Ophthalmology   Why:  November 16 at 9:05am   Contact information:   [place] [address] [phone]         Please contact your physician with any problems or Same Day Surgery at [phone], Monday through Friday 6 am to 4 pm, or [place] [facility] number at [phone].

## 2014-12-28 NOTE — H&P (Signed)
  All labs reviewed. Abnormal studies sent to patients PCP when indicated.  Previous H&P reviewed, patient examined, there are NO CHANGES.  [name] Mccarley LOUIS11/15/20167:13 AM

## 2014-12-28 NOTE — Op Note (Signed)
PREOPERATIVE DIAGNOSIS:  Nuclear sclerotic cataract of the left eye.   POSTOPERATIVE DIAGNOSIS:  nuclear sclerotic cataract left eye   OPERATIVE PROCEDURE:  Procedure(s): CATARACT EXTRACTION PHACO AND INTRAOCULAR LENS PLACEMENT (IOC)   SURGEON:  Birder Robson, MD.   ANESTHESIA:   Anesthesiologist: Alvin Critchley, MD CRNA: Bernardo Heater, CRNA  1.      Managed anesthesia care. 2.      Topical tetracaine drops followed by 2% Xylocaine jelly applied in the preoperative holding area.   COMPLICATIONS:  None.   TECHNIQUE:   Stop and chop   DESCRIPTION OF PROCEDURE:  The patient was examined and consented in the preoperative holding area where the aforementioned topical anesthesia was applied to the left eye and then brought back to the Operating Room where the left eye was prepped and draped in the usual sterile ophthalmic fashion and a lid speculum was placed. A paracentesis was created with the side port blade and the anterior chamber was filled with viscoelastic. A near clear corneal incision was performed with the steel keratome. A continuous curvilinear capsulorrhexis was performed with a cystotome followed by the capsulorrhexis forceps. Hydrodissection and hydrodelineation were carried out with BSS on a blunt cannula. The lens was removed in a stop and chop  technique and the remaining cortical material was removed with the irrigation-aspiration handpiece. The capsular bag was inflated with viscoelastic and the Technis ZCB00 lens was placed in the capsular bag without complication. The remaining viscoelastic was removed from the eye with the irrigation-aspiration handpiece. The wounds were hydrated. The anterior chamber was flushed with Miostat and the eye was inflated to physiologic pressure. 0.1 mL of cefuroxime concentration 10 mg/mL was placed in the anterior chamber. The wounds were found to be water tight. The eye was dressed with Vigamox. The patient was given protective glasses to wear  throughout the day and a shield with which to sleep tonight. The patient was also given drops with which to begin a drop regimen today and will follow-up with me in one day.  Implant Name Type Inv. Item Serial No. Manufacturer Lot No. LRB No. Used  LENS IMPL INTRAOC ZCB00 16.0 - [id number] Intraocular Lens LENS IMPL INTRAOC ZCB00 16.0 [id number]   Left 1   Procedure(s) with comments: CATARACT EXTRACTION PHACO AND INTRAOCULAR LENS PLACEMENT (IOC) (Left) - [place] 00:38 AP% 19.9 CDE 7.70 fluid pack lot # [id number]  Electronically signed: [place] 12/28/2014 7:46 AM

## 2014-12-28 NOTE — Transfer of Care (Signed)
Immediate Anesthesia Transfer of Care Note  Patient: [name]  Procedure(s) Performed: Procedure(s) with comments: CATARACT EXTRACTION PHACO AND INTRAOCULAR LENS PLACEMENT (IOC) (Left) - [place] 00:38 AP% 19.9 CDE 7.70 fluid pack lot # [id number]  Patient Location: PACU  Anesthesia Type:MAC  Level of Consciousness: awake, alert , oriented and patient cooperative  Airway & Oxygen Therapy: Patient Spontanous Breathing  Post-op Assessment: Report given to RN and Post -op Vital signs reviewed and stable  Post vital signs: Reviewed and stable  Last Vitals:  Filed Vitals:   12/28/14 0749  BP: 149/75  Pulse: 66  Temp: 36.9 C  Resp: 16    Complications: No apparent anesthesia complications

## 2014-12-28 NOTE — Anesthesia Postprocedure Evaluation (Signed)
  Anesthesia Post-op Note  Patient: [name] Price  Procedure(s) Performed: Procedure(s) with comments: CATARACT EXTRACTION PHACO AND INTRAOCULAR LENS PLACEMENT (IOC) (Left) - [place] 00:38 AP% 19.9 CDE 7.70 fluid pack lot # [id number]  Anesthesia type:MAC  Patient location: PACU  Post pain: Pain level controlled  Post assessment: Post-op Vital signs reviewed, Patient's Cardiovascular Status Stable, Respiratory Function Stable, Patent Airway and No signs of Nausea or vomiting  Post vital signs: Reviewed and stable  Last Vitals:  Filed Vitals:   12/28/14 0749  BP: 149/75  Pulse: 66  Temp: 36.9 C  Resp: 16    Level of consciousness: awake, alert  and patient cooperative  Complications: No apparent anesthesia complications

## 2015-01-14 ENCOUNTER — Encounter: Payer: Self-pay | Admitting: Gastroenterology

## 2015-01-26 ENCOUNTER — Encounter: Payer: Self-pay | Admitting: Family Medicine

## 2015-01-26 ENCOUNTER — Ambulatory Visit (INDEPENDENT_AMBULATORY_CARE_PROVIDER_SITE_OTHER): Payer: Medicare Other | Admitting: Family Medicine

## 2015-01-26 ENCOUNTER — Ambulatory Visit (INDEPENDENT_AMBULATORY_CARE_PROVIDER_SITE_OTHER)
Admission: RE | Admit: 2015-01-26 | Discharge: 2015-01-26 | Disposition: A | Discharge status: Home / Self Care | Payer: Medicare Other | Source: Ambulatory Visit | Attending: Family Medicine | Admitting: Family Medicine

## 2015-01-26 VITALS — BP 126/82 | HR 73 | Temp 98.7°F | Ht 63.0 in | Wt 165.0 lb

## 2015-01-26 DIAGNOSIS — M7989 Other specified soft tissue disorders: Secondary | ICD-10-CM | POA: Diagnosis not present

## 2015-01-26 DIAGNOSIS — M25432 Effusion, left wrist: Secondary | ICD-10-CM

## 2015-01-26 NOTE — Patient Instructions (Signed)
Nice to meet you. We are going to obtain an x-ray to make sure the bones in her forearm and wrist are intact. We are going to refer to sports medicine for further evaluation of this area. If you develop pain, numbness, weakness, tingling, increased swelling, radiation of your pain, or any chest pain or shortness of breath please seek medical attention.

## 2015-01-26 NOTE — Assessment & Plan Note (Addendum)
Patient left wrist/area just proximal to left wrist effusion versus cystic structure with no known injury. She is neurovascularly intact. There is no bony tenderness. Swelling has been improving since she noticed it. Unknown cause at this time. Could be a ganglion cyst versus some other cystic structure versus tenosynovitis. Could be some unrecognized trauma as well. Unlikely to be a DVT given focal swelling and no other swelling in her arm in a patient with no history of blood clot. Likely be an infectious cause given lack of warmth, erythema, and fever and she has full range of motion of the joint. We will obtain an x-ray of her left wrist and forearm to rule out fracture. She'll use ice in this area. We'll refer her to sports medicine for evaluation and ultrasound. She is given return precautions.

## 2015-01-26 NOTE — Progress Notes (Signed)
Patient ID: [name] Price, female   DOB: 30-Nov-1940, 74 y.o.   MRN: [id number]  [name] Rumps, MD Phone: [phone]  [name] Price is a 74 y.o. female who presents today for same-day visit.  Left wrist swelling: Patient notes today she was sitting at her computer in a chair and then went to scratch her left wrist and noted that it was swollen in the dorsum of the wrist. This is focal swelling just proximal to her wrist extending to her MCPs. She notes this was quite swollen earlier. She had no swelling prior to this. She notes mild discomfort with this. Some tightness earlier the dorsum of her wrist. She notes the swelling is improved significantly since this morning. She denies numbness, weakness, and injury. She has not had any fevers. Is no warmth or erythema to the area She cannot remember injuring it. She notes she rubbed it and the swelling went down. She's not taking any medicines for it. She is not on a blood thinner.  PMH: nonsmoker.   ROS see history of present illness   Objective  Physical Exam Filed Vitals:   01/26/15 1329  BP: 126/82  Pulse: 73  Temp: 98.7 F (37.1 C)    Physical Exam  Constitutional: She is well-developed, well-nourished, and in no distress.  HENT:  Head: Normocephalic and atraumatic.  Cardiovascular: Normal rate, regular rhythm and normal heart sounds.  Exam reveals no gallop and no friction rub.   No murmur heard. Pulmonary/Chest: Effort normal and breath sounds normal. No respiratory distress. She has no wheezes. She has no rales.  Musculoskeletal:  Left dorsum of wrist noted with 4-5 cm area of swelling and soft tissue mass with some fluctuance, there is bruising overlying this and the dorsum of her hand, there is no swelling in her hand or the rest of her arm, there is no erythema to this, there is no warmth to this, it does not extend to the volar surface of her wrist or the palmar surface of her hand, there is no snuffbox tenderness,  there is no bony tenderness in her left wrist, forearm, and hand, she has full range of motion of her left wrist, sensation to light touch is intact in left hand and arm, 5 out of 5 grip strength in left hand, good cap refill, 2+ radial pulse Right wrist and hand with no tenderness or swelling, full range of motion, 5 out of 5 strength in grip, sensation to light touch intact, 2+ radial pulse, good cap refill  Neurological: She is alert.  Skin: Skin is warm and dry. She is not diaphoretic.     Assessment/Plan: Please see individual problem list.  Left wrist effusion Patient left wrist/area just proximal to left wrist effusion versus cystic structure with no known injury. She is neurovascularly intact. There is no bony tenderness. Swelling has been improving since she noticed it. Unknown cause at this time. Could be a ganglion cyst versus some other cystic structure versus tenosynovitis. Could be some unrecognized trauma as well. Unlikely to be a DVT given focal swelling and no other swelling in her arm in a patient with no history of blood clot. Likely be an infectious cause given lack of warmth, erythema, and fever and she has full range of motion of the joint. We will obtain an x-ray of her left wrist and forearm to rule out fracture. She'll use ice in this area. We'll refer her to sports medicine for evaluation and ultrasound. She is given return  precautions.    Orders Placed This Encounter  Procedures  . DG Wrist Complete Left    Standing Status: Future     Number of Occurrences:      Standing Expiration Date: 03/28/2016    Order Specific Question:  Reason for Exam (SYMPTOM  OR DIAGNOSIS REQUIRED)    Answer:  left wrist swelling    Order Specific Question:  Preferred imaging location?    Answer:  [facility]  . DG Forearm Left    Standing Status: Future     Number of Occurrences:      Standing Expiration Date: 03/28/2016    Order Specific Question:  Reason for Exam (SYMPTOM  OR  DIAGNOSIS REQUIRED)    Answer:  left wrist swelling    Order Specific Question:  Preferred imaging location?    Answer:  [facility]  . Ambulatory referral to Sports Medicine    Referral Priority:  Routine    Referral Type:  Consultation    Number of Visits Requested:  1    Dragon voice recognition software was used during the dictation process of this note. If any phrases or words seem inappropriate it is likely secondary to the translation process being inefficient.  [name] Price

## 2015-01-26 NOTE — Progress Notes (Signed)
Pre visit review using our clinic review tool, if applicable. No additional management support is needed unless otherwise documented below in the visit note. 

## 2015-01-27 ENCOUNTER — Encounter: Payer: Self-pay | Admitting: Family Medicine

## 2015-01-27 ENCOUNTER — Other Ambulatory Visit (INDEPENDENT_AMBULATORY_CARE_PROVIDER_SITE_OTHER): Payer: Medicare Other

## 2015-01-27 ENCOUNTER — Ambulatory Visit (INDEPENDENT_AMBULATORY_CARE_PROVIDER_SITE_OTHER): Payer: Medicare Other | Admitting: Family Medicine

## 2015-01-27 VITALS — BP 132/80 | HR 80 | Temp 98.1°F | Ht 63.0 in | Wt 163.0 lb

## 2015-01-27 DIAGNOSIS — M25532 Pain in left wrist: Secondary | ICD-10-CM | POA: Diagnosis not present

## 2015-01-27 DIAGNOSIS — M25432 Effusion, left wrist: Secondary | ICD-10-CM

## 2015-01-27 MED ORDER — AMOXICILLIN-POT CLAVULANATE 875-125 MG PO TABS: 1.0000 | ORAL_TABLET | Freq: Two times a day (BID) | ORAL | Status: DC

## 2015-01-27 NOTE — Assessment & Plan Note (Signed)
Difficult to assess for early at this times in her to the swelling. I do believe the patient may have had a tendon rupture. I think that this was likely traumatic. Patient does have pain over the dorsal aspect of the arm. Patient is adamant that no injury did occur. Differential also includes infectious etiology. Patient was given an antibiotic. We discussed icing regimen. We discussed home exercises. Patient is to try a topical medicine to help with the bruising. We discussed continuing elevation. We would like her to come back though in one week to make sure that we are responding. Patient knows if any fever, chills, any significant red streaking or worsening pain to seek medical attention immediately.

## 2015-01-27 NOTE — Patient Instructions (Signed)
Good to see you Ice 20 minutes 2 times daily. Usually after activity and before bed. Augmentin 2 times daily for 10 days Arnica lotion over the counter can help If numbness, worsening swelling, or fever please seek medical attention immediately.  See me again in the next week to mae sure everything is healing fine OK TO DOUBLE BOOK

## 2015-01-27 NOTE — Progress Notes (Signed)
Pre visit review using our clinic review tool, if applicable. No additional management support is needed unless otherwise documented below in the visit note. 

## 2015-01-27 NOTE — Progress Notes (Signed)
[facility] [place] [address] [place], [place] [address] Phone: [phone] Subjective:    I'm seeing this patient by the request  of:  Sonneberg MD.   CC: Left wrist swelling  [id number] [name] Price is a 74 y.o. female coming in with complaint of left wrist swelling. Patient states that yesterday she was at a computer and was sitting there noticed swelling of her arm. Patient went to the physician. They were concern for hematoma and was sent here for further evaluation. Patient does not remember any true injury. States that the only thing that has happened and she did have a scratch from her dog approximate one week ago. Seem to be healing fine. States that the swelling was bad enough that was causing pain last night but now seems to be resolving with the aid of ice. Patient does have an anti-inflammatory. Patient has been trying elevation. States that it is about 50% better than yesterday. Denies any numbness. Denies any recent fevers, chills, or any recent antibiotic use.  Past Medical History  Diagnosis Date  . Osteopenia   . Hyperlipidemia   . Arthritis   . History of chicken pox   . UTI (urinary tract infection)   . Blood transfusion without reported diagnosis     as baby  . Allergy   . Anxiety   . Breast cancer [facility])     s/p lumpectomy and radiation, no chemo-right   Past Surgical History  Procedure Laterality Date  . Oophorectomy Left   . Total hip arthroplasty Bilateral     (2) Two  . Breast surgery Right   . Total shoulder replacement    . Colonoscopy    . Polypectomy    . Joint replacement Bilateral     hip  / shoulder  . Cataract extraction w/phaco Left 12/28/2014    Procedure: CATARACT EXTRACTION PHACO AND INTRAOCULAR LENS PLACEMENT (IOC);  Surgeon: Birder Robson, MD;  Location: [facility] ORS;  Service: Ophthalmology;  Laterality: Left;  [place] 00:38 AP% 19.9 CDE 7.70 fluid pack lot # [id number]   Social History  Substance Use  Topics  . Smoking status: Never Smoker   . Smokeless tobacco: Never Used  . Alcohol Use: No   Allergies  Allergen Reactions  . Codeine     REACTION: Nausea and vomiting   Family History  Problem Relation Age of Onset  . Lung cancer Mother   . Heart disease Mother   . Colon cancer Mother   . Cancer Father   . Colon cancer Father   . Breast cancer Maternal Aunt   . Breast cancer Maternal Aunt   . Breast cancer Maternal Aunt   . Rectal cancer Neg Hx   . Stomach cancer Neg Hx          Past medical history, social, surgical and family history all reviewed in electronic medical record.   Review of Systems: No headache, visual changes, nausea, vomiting, diarrhea, constipation, dizziness, abdominal pain, skin rash, fevers, chills, night sweats, weight loss, swollen lymph nodes, body aches, joint swelling, muscle aches, chest pain, shortness of breath, mood changes.   Objective Blood pressure 132/80, pulse 80, temperature 98.1 F (36.7 C), temperature source Oral, height 5\' 3"  (1.6 m), weight 163 lb (73.936 kg), SpO2 97 %.  General: No apparent distress alert and oriented x3 mood and affect normal, dressed appropriately.  HEENT: Pupils equal, extraocular movements intact  Respiratory: Patient's speak in full sentences and does not appear short  of breath  Cardiovascular: No lower extremity edema, non tender, no erythema  Skin: Warm dry intact with no signs of infection or rash on extremities or on axial skeleton.  Abdomen: Soft nontender  Neuro: Cranial nerves II through XII are intact, neurovascularly intact in all extremities with 2+ DTRs and 2+ pulses.  Lymph: No lymphadenopathy of posterior or anterior cervical chain or axillae bilaterally.  Gait normal with good balance and coordination.  MSK:  Non tender with full range of motion and good stability and symmetric strength and tone of shoulders, elbows, , hip, knee and ankles bilaterally.  Wrist: Left Significant swelling  compared to the contralateral side with resolving bruising noted mid way from formal weight of the fingers. Mild warmness to palpation. Neurovascular intact distally at the fingers. ROM smooth and normal with good flexion and extension and ulnar/radial deviation that is symmetrical with opposite wrist. Palpation is normal over metacarpals, navicular, lunate, and TFCC; patient though does have tenderness to palpation over the third dorsal compartment. Patient's second dorsal compartment does show that patient does have what appears to be thickening of the extensor common tendon muscle. No snuffbox tenderness. No tenderness over Canal of [place]. Strength 5/5 in all directions without pain. Negative Finkelstein, tinel's and phalens. Negative Watson's test. Contralateral wrist unremarkable.  MSK US performed of: Left This study was ordered, performed, and interpreted by Charlann Boxer D.O.  Wrist: All extensor compartments visualized  Significant hypoechoic changes within the third dorsal compartment. Patient does have some surrounding soft tissue edema as well. Cannot tell if this is infectious etiology or not. It appears the patient does have a tendon rupture within the third dorsal compartment. Increasing Doppler flow within the area. TFCC intact. Scapholunate ligament intact. Carpal tunnel visualized and median nerve area normal, flexor tendons all normal in appearance without fraying, tears, or sheath effusions. Power doppler signal normal.  IMPRESSION:  Effusion with likely extensor tendon rupture.    Impression and Recommendations:     This case required medical decision making of moderate complexity.

## 2015-02-01 ENCOUNTER — Encounter: Payer: Self-pay | Admitting: Family Medicine

## 2015-02-01 ENCOUNTER — Ambulatory Visit (INDEPENDENT_AMBULATORY_CARE_PROVIDER_SITE_OTHER): Payer: Medicare Other | Admitting: Family Medicine

## 2015-02-01 VITALS — BP 112/84 | HR 95 | Temp 98.1°F | Ht 63.0 in | Wt 163.0 lb

## 2015-02-01 DIAGNOSIS — Z01419 Encounter for gynecological examination (general) (routine) without abnormal findings: Secondary | ICD-10-CM | POA: Diagnosis not present

## 2015-02-01 DIAGNOSIS — Z6830 Body mass index (BMI) 30.0-30.9, adult: Secondary | ICD-10-CM | POA: Diagnosis not present

## 2015-02-01 DIAGNOSIS — S66812A Strain of other specified muscles, fascia and tendons at wrist and hand level, left hand, initial encounter: Secondary | ICD-10-CM

## 2015-02-01 DIAGNOSIS — S66912A Strain of unspecified muscle, fascia and tendon at wrist and hand level, left hand, initial encounter: Secondary | ICD-10-CM

## 2015-02-01 DIAGNOSIS — S66819A Strain of other specified muscles, fascia and tendons at wrist and hand level, unspecified hand, initial encounter: Secondary | ICD-10-CM | POA: Insufficient documentation

## 2015-02-01 NOTE — Progress Notes (Signed)
Pre visit review using our clinic review tool, if applicable. No additional management support is needed unless otherwise documented below in the visit note. 

## 2015-02-01 NOTE — Patient Instructions (Signed)
Extensor tendon rupture Wear brace as much as you can and especially at night Ice still can help Bruising will go away over the next 2 weeks.  See me again in 2-3 weeks to make sure we are happy with the results.  Happy holidays!

## 2015-02-01 NOTE — Progress Notes (Signed)
[facility] [address] [place], [place] [address] Phone: [phone] Subjective:     CC: Left wrist swelling follow up  [id number] [name] Price is a 74 y.o. female coming in with complaint of left wrist swelling. Patient was seen previously and had more of a extensor tendon rupture. No fracture was noted at that time. Patient was to take anti-inflammatories. States that this had been helpful but did give her diarrhea. Patient has noticed that the swelling has improved. Did not have any weakness. States that overall she was increasing her range of motion. Still states that only in one places the pain. Nothing that is stopping her from daily activity and no weakness. Feels like she is been able to do at least 70% of her daily activities.   Past Medical History  Diagnosis Date  . Osteopenia   . Hyperlipidemia   . Arthritis   . History of chicken pox   . UTI (urinary tract infection)   . Blood transfusion without reported diagnosis     as baby  . Allergy   . Anxiety   . Breast cancer [facility])     s/p lumpectomy and radiation, no chemo-right   Past Surgical History  Procedure Laterality Date  . Oophorectomy Left   . Total hip arthroplasty Bilateral     (2) Two  . Breast surgery Right   . Total shoulder replacement    . Colonoscopy    . Polypectomy    . Joint replacement Bilateral     hip  / shoulder  . Cataract extraction w/phaco Left 12/28/2014    Procedure: CATARACT EXTRACTION PHACO AND INTRAOCULAR LENS PLACEMENT (IOC);  Surgeon: Birder Robson, MD;  Location: [facility] ORS;  Service: Ophthalmology;  Laterality: Left;  [place] 00:38 AP% 19.9 CDE 7.70 fluid pack lot # [id number] H   Social History  Substance Use Topics  . Smoking status: Never Smoker   . Smokeless tobacco: Never Used  . Alcohol Use: No   Allergies  Allergen Reactions  . Codeine     REACTION: Nausea and vomiting   Family History  Problem Relation Age of Onset  . Lung cancer  Mother   . Heart disease Mother   . Colon cancer Mother   . Cancer Father   . Colon cancer Father   . Breast cancer Maternal Aunt   . Breast cancer Maternal Aunt   . Breast cancer Maternal Aunt   . Rectal cancer Neg Hx   . Stomach cancer Neg Hx          Past medical history, social, surgical and family history all reviewed in electronic medical record.   Review of Systems: No headache, visual changes, nausea, vomiting, diarrhea, constipation, dizziness, abdominal pain, skin rash, fevers, chills, night sweats, weight loss, swollen lymph nodes, body aches, joint swelling, muscle aches, chest pain, shortness of breath, mood changes.   Objective Blood pressure 112/84, pulse 95, temperature 98.1 F (36.7 C), temperature source Oral, height 5\' 3"  (1.6 m), weight 163 lb (73.936 kg), SpO2 96 %.  General: No apparent distress alert and oriented x3 mood and affect normal, dressed appropriately.  HEENT: Pupils equal, extraocular movements intact  Respiratory: Patient's speak in full sentences and does not appear short of breath  Cardiovascular: No lower extremity edema, non tender, no erythema  Skin: Warm dry intact with no signs of infection or rash on extremities or on axial skeleton.  Abdomen: Soft nontender  Neuro: Cranial nerves II through XII  are intact, neurovascularly intact in all extremities with 2+ DTRs and 2+ pulses.  Lymph: No lymphadenopathy of posterior or anterior cervical chain or axillae bilaterally.  Gait normal with good balance and coordination.  MSK:  Non tender with full range of motion and good stability and symmetric strength and tone of shoulders, elbows, , hip, knee and ankles bilaterally.  Wrist: Left Swelling has considerably decreased since last follow-up. Bruising seems to be improving slowly but did have more distribution up the forearm. ROM smooth and normal with good flexion and extension and ulnar/radial deviation that is symmetrical with opposite  wrist. Tender over the dorsal aspect over the triquetrum bone as well as TFCC and the third dorsal compartment. Patient's second dorsal compartment does show that patient does have what appears to be thickening of the extensor common tendon muscle. No snuffbox tenderness. No tenderness over Canal of [place]. Strength 5/5 in all directions without pain. Negative Finkelstein, tinel's and phalens. Negative Watson's test. Contralateral wrist unremarkable.  MSK US performed of: Left This study was ordered, performed, and interpreted by Charlann Boxer D.O.  Wrist: All extensor compartments visualized  third compartment does show that patient does have an extensor tendon rupture. One of 3 tendons seem to be rupture. Still mild effusion noted of the tendon sheath but improved from previous exam. Patient does have a small avulsion fracture on the dorsal aspect of the triquetrum bone. Some callus formation or any noted. TFCC does have some degenerative changes that is now seen compared to previous exam. Scapholunate ligament intact. Carpal tunnel visualized and median nerve area normal, flexor tendons all normal in appearance without fraying, tears, or sheath effusions. Power doppler signal normal. Images saved and heart drive  IMPRESSION:  Improving swelling, continued extensor tendon rupture with significant retraction and avulsion fracture that seems to be healing.    Impression and Recommendations:     This case required medical decision making of moderate complexity.

## 2015-02-01 NOTE — Assessment & Plan Note (Addendum)
Patient seems to be doing relatively better. We do have the extensor tendon rupture. Patient though is has no weakness. I do think that overall patient will do better. Patient knows to call if any worsening symptoms. Patient and the brace to wear at night and can wear with activity if it is beneficial. I do not feel that further immobilization is necessary. Patient will come back again in 2-3 weeks for further evaluation and treatment. Spent  25 minutes with patient face-to-face and had greater than 50% of counseling including as described above in assessment and plan.

## 2015-02-23 ENCOUNTER — Encounter: Payer: Self-pay | Admitting: Family Medicine

## 2015-02-23 ENCOUNTER — Ambulatory Visit (INDEPENDENT_AMBULATORY_CARE_PROVIDER_SITE_OTHER): Payer: Medicare Other | Admitting: Family Medicine

## 2015-02-23 VITALS — BP 126/84 | HR 87 | Ht 63.0 in | Wt 165.0 lb

## 2015-02-23 DIAGNOSIS — S66812D Strain of other specified muscles, fascia and tendons at wrist and hand level, left hand, subsequent encounter: Secondary | ICD-10-CM

## 2015-02-23 DIAGNOSIS — M67432 Ganglion, left wrist: Secondary | ICD-10-CM

## 2015-02-23 DIAGNOSIS — M67439 Ganglion, unspecified wrist: Secondary | ICD-10-CM | POA: Insufficient documentation

## 2015-02-23 DIAGNOSIS — S66912D Strain of unspecified muscle, fascia and tendon at wrist and hand level, left hand, subsequent encounter: Secondary | ICD-10-CM

## 2015-02-23 NOTE — Patient Instructions (Signed)
Good to see you Ice is your friend Wear brace when you would like Stay active and if it bugs you more you know where I am I drained the cyst today  I can repeat this every 3 months safely and every time I do it should come back less and less.  Happy new year and see me when you need me.

## 2015-02-23 NOTE — Progress Notes (Signed)
Pre visit review using our clinic review tool, if applicable. No additional management support is needed unless otherwise documented below in the visit note. 

## 2015-02-23 NOTE — Assessment & Plan Note (Signed)
Aspirated today and tolerated the procedure well. About 50% was improved. Corticosteroid injection given. Hopefully this will decrease the inflammation. Patient come back and see me again in 3-4 weeks if any reaccumulation occurs otherwise follow-up as needed. Nose we can repeat at range every 3 months

## 2015-02-23 NOTE — Progress Notes (Signed)
[facility] [place], [place] [address] Phone: [phone] Subjective:     CC: Left wrist swelling follow up  [id number] [name] Price is a 75 y.o. female coming in with complaint of left wrist swelling. Patient was seen previously and had more of a extensor tendon rupture. No fracture was noted at that time. Patient was to take anti-inflammatories. States that this had been helpful but did give her diarrhea. Patient has noticed that the swelling has improved. Did not have any weakness. States that overall she was increasing her range of motion. Still states that only in one places the pain. Nothing that is stopping her from daily activity and no weakness. Feels like she is been able to do at least 70% of her daily activities. States that there is only the cosmetic effect on the dorsal aspect of her wrist that she finds concerning.  Past Medical History  Diagnosis Date  . Osteopenia   . Hyperlipidemia   . Arthritis   . History of chicken pox   . UTI (urinary tract infection)   . Blood transfusion without reported diagnosis     as baby  . Allergy   . Anxiety   . Breast cancer [facility])     s/p lumpectomy and radiation, no chemo-right   Past Surgical History  Procedure Laterality Date  . Oophorectomy Left   . Total hip arthroplasty Bilateral     (2) Two  . Breast surgery Right   . Total shoulder replacement    . Colonoscopy    . Polypectomy    . Joint replacement Bilateral     hip  / shoulder  . Cataract extraction w/phaco Left 12/28/2014    Procedure: CATARACT EXTRACTION PHACO AND INTRAOCULAR LENS PLACEMENT (IOC);  Surgeon: Birder Robson, MD;  Location: [facility] ORS;  Service: Ophthalmology;  Laterality: Left;  [place] 00:38 AP% 19.9 CDE 7.70 fluid pack lot # [id number] H   Social History  Substance Use Topics  . Smoking status: Never Smoker   . Smokeless tobacco: Never Used  . Alcohol Use: No   Allergies  Allergen Reactions  .  Codeine     REACTION: Nausea and vomiting   Family History  Problem Relation Age of Onset  . Lung cancer Mother   . Heart disease Mother   . Colon cancer Mother   . Cancer Father   . Colon cancer Father   . Breast cancer Maternal Aunt   . Breast cancer Maternal Aunt   . Breast cancer Maternal Aunt   . Rectal cancer Neg Hx   . Stomach cancer Neg Hx          Past medical history, social, surgical and family history all reviewed in electronic medical record.   Review of Systems: No headache, visual changes, nausea, vomiting, diarrhea, constipation, dizziness, abdominal pain, skin rash, fevers, chills, night sweats, weight loss, swollen lymph nodes, body aches, joint swelling, muscle aches, chest pain, shortness of breath, mood changes.   Objective Blood pressure 126/84, pulse 87, height 5\' 3"  (1.6 m), weight 165 lb (74.844 kg), SpO2 96 %.  General: No apparent distress alert and oriented x3 mood and affect normal, dressed appropriately.  HEENT: Pupils equal, extraocular movements intact  Respiratory: Patient's speak in full sentences and does not appear short of breath  Cardiovascular: No lower extremity edema, non tender, no erythema  Skin: Warm dry intact with no signs of infection or rash on extremities or on axial skeleton.  Abdomen: Soft nontender  Neuro: Cranial nerves II through XII are intact, neurovascularly intact in all extremities with 2+ DTRs and 2+ pulses.  Lymph: No lymphadenopathy of posterior or anterior cervical chain or axillae bilaterally.  Gait normal with good balance and coordination.  MSK:  Non tender with full range of motion and good stability and symmetric strength and tone of shoulders, elbows, , hip, knee and ankles bilaterally.  Wrist: Left Bruising and swelling has resolved. Patient does have a cystic formation on the dorsal aspect of the wrist that is mainly tender and is movable. Fluctuant. No erythema. ROM smooth and normal with good flexion and  extension and ulnar/radial deviation that is symmetrical with opposite wrist. Tender over the dorsal aspect over the triquetrum bone as well as TFCC and the third dorsal compartment. Patient's second dorsal compartment does show that patient does have what appears to be thickening of the extensor common tendon muscle. No snuffbox tenderness. No tenderness over Canal of [place]. Strength 5/5 in all directions without pain. Negative Finkelstein, tinel's and phalens. Negative Watson's test. Contralateral wrist unremarkable.   After verbal consent patient was prepped with call swabs and with a 18-gauge 2 inch needle patient was injected with a total of 0.5 mL. Patient did have gel like fluid removed at that time and decreased the size of the mass by approximately 50%.patient was injected with a total of 0.5 mL of 0.5% Marcaine Post injection instructions given.   Impression and Recommendations:     This case required medical decision making of moderate complexity.

## 2015-02-23 NOTE — Assessment & Plan Note (Signed)
Patient is compensating well and we'll continue to likely do very well. I do not see any reason to do any more aggressive therapy. Patient did have a ganglion cyst that is likely secondary to the traumatic episode. We did do removal of some of the material. Patient has any worsening symptoms or reaccumulation she will come back again for further evaluation. Patient otherwise as long as she continues to improve can follow-up on an as-needed basis.  Spent  25 minutes with patient face-to-face and had greater than 50% of counseling including as described above in assessment and plan.

## 2015-03-11 ENCOUNTER — Telehealth: Payer: Self-pay | Admitting: Family Medicine

## 2015-03-11 NOTE — Telephone Encounter (Signed)
Pt has appt to see Dr Damita Dunnings on 03/14/15 at 10:30 Am.

## 2015-03-11 NOTE — Telephone Encounter (Signed)
If ongoing for weeks, then I'll see her Monday.  Drink plenty of fluids in the meantime. Thanks.

## 2015-03-11 NOTE — Telephone Encounter (Signed)
Patient says it's been a good 2 weeks.  Will keep appt on Monday.  Advised to drink plenty of fluids in the meantime.

## 2015-03-11 NOTE — Telephone Encounter (Signed)
PLEASE NOTE: All timestamps contained within this report are represented as [place] Standard Time. CONFIDENTIALTY NOTICE: This fax transmission is intended only for the addressee. It contains information that is legally privileged, confidential or otherwise protected from use or disclosure. If you are not the intended recipient, you are strictly prohibited from reviewing, disclosing, copying using or disseminating any of this information or taking any action in reliance on or regarding this information. If you have received this fax in error, please notify us immediately by telephone so that we can arrange for its return to [place]. Phone: [phone], Toll-Free: [phone], Fax: [phone] Page: 1 of 1 Call Id: [id number] [place] Patient Name: [name] Price DOB: 03-08-1940 Initial Comment Caller States when she gets up she feels dizzy and is gonna fall over. also been having nausea off/on Nurse Assessment Nurse: Marcelline Deist, RN, Kermit Balo Date/Time (Eastern Time): 03/11/2015 10:50:45 AM Confirm and document reason for call. If symptomatic, describe symptoms. You must click the next button to save text entered. ---Caller states when she gets up, she feels dizzy and is going fall over. Has had this for weeks now following sinus/cold symptoms. Has a stuffy nose. She also been having nausea off/on following the dizziness. Has the patient traveled out of the country within the last 30 days? ---Not Applicable Does the patient have any new or worsening symptoms? ---Yes Will a triage be completed? ---Yes Related visit to physician within the last 2 weeks? ---No Does the PT have any chronic conditions? (i.e. diabetes, asthma, etc.) ---Yes List chronic conditions. ---Paxil, weak bones, 2 hip replacements, arthritis Is this a behavioral health or substance abuse call? ---No Guidelines Guideline Title Affirmed  Question Affirmed Notes Dizziness - Lightheadedness [1] MODERATE dizziness (e.g., interferes with normal activities) AND [2] has NOT been evaluated by physician for this (Exception: dizziness caused by heat exposure, sudden standing, or poor fluid intake) Final Disposition User See Physician within [place], RN, [employer] Referrals REFERRED TO PCP OFFICE Disagree/Comply: Leta Baptist

## 2015-03-14 ENCOUNTER — Encounter: Payer: Self-pay | Admitting: Family Medicine

## 2015-03-14 ENCOUNTER — Ambulatory Visit (INDEPENDENT_AMBULATORY_CARE_PROVIDER_SITE_OTHER): Payer: Medicare Other | Admitting: Family Medicine

## 2015-03-14 VITALS — BP 104/80 | HR 88 | Temp 97.8°F | Wt 164.5 lb

## 2015-03-14 DIAGNOSIS — S51812A Laceration without foreign body of left forearm, initial encounter: Secondary | ICD-10-CM | POA: Diagnosis not present

## 2015-03-14 DIAGNOSIS — H8110 Benign paroxysmal vertigo, unspecified ear: Secondary | ICD-10-CM

## 2015-03-14 DIAGNOSIS — Z23 Encounter for immunization: Secondary | ICD-10-CM | POA: Diagnosis not present

## 2015-03-14 DIAGNOSIS — S51812D Laceration without foreign body of left forearm, subsequent encounter: Secondary | ICD-10-CM

## 2015-03-14 MED ORDER — MECLIZINE HCL 25 MG PO TABS: 12.5000 mg | ORAL_TABLET | Freq: Two times a day (BID) | ORAL | Status: DC | PRN

## 2015-03-14 NOTE — Patient Instructions (Signed)
Try OTC meclizine for the vertigo and use the bedside exercise.  If you aren't better, then we can refer you to the ENT clinic.  Take care.  Glad to see you.

## 2015-03-14 NOTE — Progress Notes (Signed)
Pre visit review using our clinic review tool, if applicable. No additional management support is needed unless otherwise documented below in the visit note.  For the last 2 weeks, she'll feel "funny".  Episodic, better laying down.  No presyncope.  She can have the sensation of motion, w/o moving, usually in the same direction.  She hadn't noted changes prev with head movement.  Not worse if rolling over in the bed.  No ear pain.  Hearing is fine.  No ringing in her ears.  Prev with some HA, not now.  Felt stuffy.  Some nausea, talking saltines for that.  No vomiting.  No fevers.  Some cough with sputum production.  Some rhinorrhea.  Some sneezing.    Her L wrist sx are resolved from prev.   5cm lac on the L forearm, from her dog scratching her this AM.    Meds, vitals, and allergies reviewed.   ROS: See HPI.  Otherwise, noncontributory.  GEN: nad, alert and oriented HEENT: mucous membranes moist NECK: supple w/o LA CV: rrr.   PULM: ctab, no inc wob ABD: soft, +bs EXT: no edema 5cm superficial lac on the L forearm, no need for suture or treatment, not bleeding.  Tissue still well apposed.   CN 2-12 wnl B, S/S/DTR wnl x4 but DHP positive to the L.  Sx return with looking up and to the L, when seated.

## 2015-03-15 DIAGNOSIS — H8110 Benign paroxysmal vertigo, unspecified ear: Secondary | ICD-10-CM | POA: Insufficient documentation

## 2015-03-15 NOTE — Assessment & Plan Note (Signed)
DHP pos. Path/phys d/w pt.   Can try OTC meclizine for the vertigo and use the bedside exercise, d/w pt.   If no better, then we can refer to the ENT clinic.  She agrees. Okay for outpatient f/u.

## 2015-05-10 DIAGNOSIS — Z96612 Presence of left artificial shoulder joint: Secondary | ICD-10-CM | POA: Diagnosis not present

## 2015-05-10 DIAGNOSIS — Z96643 Presence of artificial hip joint, bilateral: Secondary | ICD-10-CM | POA: Diagnosis not present

## 2015-05-10 DIAGNOSIS — M542 Cervicalgia: Secondary | ICD-10-CM | POA: Diagnosis not present

## 2015-05-12 DIAGNOSIS — M7062 Trochanteric bursitis, left hip: Secondary | ICD-10-CM | POA: Diagnosis not present

## 2015-05-30 ENCOUNTER — Ambulatory Visit (INDEPENDENT_AMBULATORY_CARE_PROVIDER_SITE_OTHER): Payer: Medicare Other

## 2015-05-30 VITALS — BP 126/82 | HR 65 | Temp 97.9°F | Ht 63.0 in | Wt 164.0 lb

## 2015-05-30 DIAGNOSIS — E7800 Pure hypercholesterolemia, unspecified: Secondary | ICD-10-CM | POA: Diagnosis not present

## 2015-05-30 DIAGNOSIS — Z0000 Encounter for general adult medical examination without abnormal findings: Secondary | ICD-10-CM | POA: Diagnosis not present

## 2015-05-30 DIAGNOSIS — Z23 Encounter for immunization: Secondary | ICD-10-CM

## 2015-05-30 LAB — LIPID PANEL
CHOL/HDL RATIO: 4
Cholesterol: 176 mg/dL (ref 0–200)
HDL: 48.1 mg/dL (ref >39.00)
LDL CALC: 104 mg/dL — AB (ref 0–99)
NONHDL: 127.8
TRIGLYCERIDES: 120 mg/dL (ref 0.0–149.0)
VLDL: 24 mg/dL (ref 0.0–40.0)

## 2015-05-30 NOTE — Progress Notes (Signed)
Pre visit review using our clinic review tool, if applicable. No additional management support is needed unless otherwise documented below in the visit note. 

## 2015-05-30 NOTE — Progress Notes (Signed)
Subjective:   [name] Price is a 75 y.o. female who presents for an Initial Medicare Annual Wellness Visit.  Cardiac Risk Factors include: advanced age (>4men, >40 women);dyslipidemia     Objective:    Today's Vitals   05/30/15 1118 05/30/15 1128  BP: 126/82   Pulse: 65   Temp: 97.9 F (36.6 C)   TempSrc: Oral   Height: 5\' 3"  (1.6 m)   Weight: 164 lb (74.39 kg)   SpO2: 95%   PainSc: 3  3   PainLoc: Hip    Body mass index is 29.06 kg/(m^2).   Current Medications (verified) Outpatient Encounter Prescriptions as of 05/30/2015  Medication Sig  . b complex vitamins capsule Take 1 capsule by mouth daily.  . bisacodyl (DULCOLAX) 5 MG EC tablet Take 5 mg by mouth daily as needed for moderate constipation. For double prep 2-22 and 2-24 then stop  . calcium carbonate (OS-CAL) 600 MG TABS Take 600 mg by mouth 2 (two) times daily with a meal.  . Cholecalciferol (VITAMIN D) 2000 units tablet Take 2,000 Units by mouth daily.  . Cyanocobalamin (VITAMIN B-12 CR PO) Take by mouth.  . Multiple Vitamins-Minerals (CENTRUM SILVER ULTRA WOMENS PO) Take 1 tablet by mouth daily.  . naproxen sodium (ANAPROX) 220 MG tablet Take 220 mg by mouth 2 (two) times daily as needed (with meals).   Marland Kitchen PARoxetine (PAXIL-CR) 25 MG 24 hr tablet Take 25 mg by mouth daily.  Marland Kitchen UNABLE TO FIND 4 scoop every morning. Reported on 05/30/2015  . [DISCONTINUED] meclizine (ANTIVERT) 25 MG tablet Take 0.5-1 tablets (12.5-25 mg total) by mouth 2 (two) times daily as needed for dizziness.   No facility-administered encounter medications on file as of 05/30/2015.    Allergies (verified) Codeine   History: Past Medical History  Diagnosis Date  . Osteopenia   . Hyperlipidemia   . Arthritis   . History of chicken pox   . UTI (urinary tract infection)   . Blood transfusion without reported diagnosis     as baby  . Allergy   . Anxiety   . Breast cancer [facility])     s/p lumpectomy and radiation, no chemo-right    Past Surgical History  Procedure Laterality Date  . Oophorectomy Left   . Total hip arthroplasty Bilateral     (2) Two  . Breast surgery Right   . Total shoulder replacement    . Colonoscopy    . Polypectomy    . Joint replacement Bilateral     hip  / shoulder  . Cataract extraction w/phaco Left 12/28/2014    Procedure: CATARACT EXTRACTION PHACO AND INTRAOCULAR LENS PLACEMENT (IOC);  Surgeon: Birder Robson, MD;  Location: [facility] ORS;  Service: Ophthalmology;  Laterality: Left;  [place] 00:38 AP% 19.9 CDE 7.70 fluid pack lot # [id number]   Family History  Problem Relation Age of Onset  . Lung cancer Mother   . Heart disease Mother   . Colon cancer Mother   . Cancer Father   . Colon cancer Father   . Breast cancer Maternal Aunt   . Breast cancer Maternal Aunt   . Breast cancer Maternal Aunt   . Rectal cancer Neg Hx   . Stomach cancer Neg Hx    Social History   Occupational History  . Not on file.   Social History Main Topics  . Smoking status: Never Smoker   . Smokeless tobacco: Never Used  . Alcohol Use: No  . Drug Use: No  .  Sexual Activity: No    Tobacco Counseling Counseling given: No   Activities of Daily Living In your present state of health, do you have any difficulty performing the following activities: 05/30/2015  Hearing? N  Vision? N  Difficulty concentrating or making decisions? N  Walking or climbing stairs? N  Dressing or bathing? N  Doing errands, shopping? N  Preparing Food and eating ? N  Using the Toilet? N  In the past six months, have you accidently leaked urine? N  Do you have problems with loss of bowel control? N  Managing your Medications? N  Managing your Finances? N  Housekeeping or managing your Housekeeping? N    Immunizations and Health Maintenance Immunization History  Administered Date(s) Administered  . Influenza Split 11/10/2010  . Influenza Whole 11/13/2007  . Influenza-Unspecified 11/12/2013  . Pneumococcal  Conjugate-13 05/30/2015  . Pneumococcal Polysaccharide-23 03/03/2013  . Td 02/13/1991, 03/14/2015   There are no preventive care reminders to display for this patient.  Patient Care Team: Tonia Ghent, MD as PCP - General [facility])  Dr. Terrence Dupont - Optometry Dr. Arvella Nigh    Assessment:   This is a routine wellness examination for [name].  Hearing/Vision screen  Hearing Screening   125Hz  250Hz  500Hz  1000Hz  2000Hz  4000Hz  8000Hz   Right ear:   0 0 40 0   Left ear:   40 40 40 0   Vision Screening Comments: Last eye exam in 12/28/2014 with [facility]  Dietary issues and exercise activities discussed: Current Exercise Habits: Home exercise routine, Type of exercise: walking, Time (Minutes): 30, Frequency (Times/Week): 7, Weekly Exercise (Minutes/Week): 210, Intensity: Mild, Exercise limited by: None identified  Goals    . Weight < 151 lb (68.493 kg)     Target weight is 150 lbs. Starting 05/30/2015, I will continue to walk twice daily for at least 30 minutes.       Depression Screen PHQ 2/9 Scores 05/30/2015  PHQ - 2 Score 0    Fall Risk Fall Risk  05/30/2015  Falls in the past year? No    Cognitive Function: MMSE - Mini Mental State Exam 05/30/2015  Orientation to time 5  Orientation to Place 5  Registration 3  Attention/ Calculation 0  Recall 2  Recall-comments recalled 2 of 3 words without cue  Language- name 2 objects 0  Language- repeat 1  Language- follow 3 step command 3  Language- read & follow direction 0  Write a sentence 0  Copy design 0  Total score 19   PLEASE NOTE: A Mini-Cog screen was completed. Maximum score is 20. A value of 0 denotes this part of [place] MMSE was not completed.  Orientation to Time - Max 5 Orientation to Place - Max 5 Registration - Max 3 Recall - Max 3 Language Repeat - Max 1 Language Follow 3 Step Command - Max 3   Screening Tests Health Maintenance  Topic Date Due  . INFLUENZA VACCINE  09/13/2015   . MAMMOGRAM  10/28/2015  . COLONOSCOPY  04/07/2017  . TETANUS/TDAP  03/13/2025  . DEXA SCAN  Completed  . ZOSTAVAX  Completed  . PNA vac Low Risk Adult  Completed      Plan:      I have personally reviewed and addressed the Medicare Annual Wellness questionnaire and have noted the following in the patient's chart:  A. Medical and social history B. Use of alcohol, tobacco or illicit drugs  C. Current medications and supplements D. Functional  ability and status E.  Nutritional status F.  Physical activity G. Advance directives H. List of other physicians I.  Hospitalizations, surgeries, and ER visits in previous 12 months J.  [place] to include hearing, vision, cognitive, depression L. Referrals and appointments - none  In addition, I have reviewed and discussed with patient certain preventive protocols, quality metrics, and best practice recommendations. A written personalized care plan for preventive services as well as general preventive health recommendations were provided to patient.  See attached scanned questionnaire for additional information.   Signed,   Lindell Noe, MHA, BS, LPN Health Advisor [id number]  I reviewed health advisor's note, was available for consultation on the day of service listed in this note, and agree with documentation and plan. Elsie Stain, MD.

## 2015-05-30 NOTE — Patient Instructions (Signed)
[name] Price , Thank you for taking time to come for your Medicare Wellness Visit. I appreciate your ongoing commitment to your health goals. Please review the following plan we discussed and let me know if I can assist you in the future.   These are the goals we discussed: Goals    . Weight < 151 lb (68.493 kg)     Target weight is 150 lbs. Starting 05/30/2015, I will continue to walk twice daily for at least 30 minutes.        This is a list of the screening recommended for you and due dates:  Health Maintenance  Topic Date Due  . Flu Shot  09/13/2015  . Mammogram  10/28/2015  . Colon Cancer Screening  04/07/2017  . Tetanus Vaccine  03/13/2025  . DEXA scan (bone density measurement)  Completed  . Shingles Vaccine  Completed  . Pneumonia vaccines  Completed    Preventive Care for Adults  A healthy lifestyle and preventive care can promote health and wellness. Preventive health guidelines for adults include the following key practices.  . A routine yearly physical is a good way to check with your health care provider about your health and preventive screening. It is a chance to share any concerns and updates on your health and to receive a thorough exam.  . Visit your dentist for a routine exam and preventive care every 6 months. Brush your teeth twice a day and floss once a day. Good oral hygiene prevents tooth decay and gum disease.  . The frequency of eye exams is based on your age, health, family medical history, use  of contact lenses, and other factors. Follow your health care provider's ecommendations for frequency of eye exams.  . Eat a healthy diet. Foods like vegetables, fruits, whole grains, low-fat dairy products, and lean protein foods contain the nutrients you need without too many calories. Decrease your intake of foods high in solid fats, added sugars, and salt. Eat the right amount of calories for you. Get information about a proper diet from your health care  provider, if necessary.  . Regular physical exercise is one of the most important things you can do for your health. Most adults should get at least 150 minutes of moderate-intensity exercise (any activity that increases your heart rate and causes you to sweat) each week. In addition, most adults need muscle-strengthening exercises on 2 or more days a week.  Silver Sneakers may be a benefit available to you. To determine eligibility, you may visit the website: www.silversneakers.com or contact program at 206-108-1806 Mon-Fri between 8AM-8PM.   . Maintain a healthy weight. The body mass index (BMI) is a screening tool to identify possible weight problems. It provides an estimate of body fat based on height and weight. Your health care provider can find your BMI and can help you achieve or maintain a healthy weight.   For adults 20 years and older: ? A BMI below 18.5 is considered underweight. ? A BMI of 18.5 to 24.9 is normal. ? A BMI of 25 to 29.9 is considered overweight. ? A BMI of 30 and above is considered obese.   . Maintain normal blood lipids and cholesterol levels by exercising and minimizing your intake of saturated fat. Eat a balanced diet with plenty of fruit and vegetables. Blood tests for lipids and cholesterol should begin at age 83 and be repeated every 5 years. If your lipid or cholesterol levels are high, you are over 50, or  you are at high risk for heart disease, you may need your cholesterol levels checked more frequently. Ongoing high lipid and cholesterol levels should be treated with medicines if diet and exercise are not working.  . If you smoke, find out from your health care provider how to quit. If you do not use tobacco, please do not start.  . If you choose to drink alcohol, please do not consume more than 2 drinks per day. One drink is considered to be 12 ounces (355 mL) of beer, 5 ounces (148 mL) of wine, or 1.5 ounces (44 mL) of liquor.  . If you are 51-79 years  old, ask your health care provider if you should take aspirin to prevent strokes.  . Use sunscreen. Apply sunscreen liberally and repeatedly throughout the day. You should seek shade when your shadow is shorter than you. Protect yourself by wearing [place] sleeves, pants, a wide-brimmed hat, and sunglasses year round, whenever you are outdoors.  . Once a month, do a whole body skin exam, using a mirror to look at the skin on your back. Tell your health care provider of new moles, moles that have irregular borders, moles that are larger than a pencil eraser, or moles that have changed in shape or color.

## 2015-06-02 ENCOUNTER — Encounter: Payer: Self-pay | Admitting: *Deleted

## 2015-08-01 DIAGNOSIS — H02403 Unspecified ptosis of bilateral eyelids: Secondary | ICD-10-CM | POA: Diagnosis not present

## 2015-09-06 DIAGNOSIS — H02403 Unspecified ptosis of bilateral eyelids: Secondary | ICD-10-CM | POA: Diagnosis not present

## 2015-10-11 DIAGNOSIS — E785 Hyperlipidemia, unspecified: Secondary | ICD-10-CM | POA: Diagnosis not present

## 2015-10-11 DIAGNOSIS — M8580 Other specified disorders of bone density and structure, unspecified site: Secondary | ICD-10-CM | POA: Diagnosis not present

## 2015-10-11 DIAGNOSIS — Z01818 Encounter for other preprocedural examination: Secondary | ICD-10-CM | POA: Diagnosis not present

## 2015-10-11 DIAGNOSIS — Z9181 History of falling: Secondary | ICD-10-CM | POA: Diagnosis not present

## 2015-10-14 DIAGNOSIS — Z885 Allergy status to narcotic agent status: Secondary | ICD-10-CM | POA: Diagnosis not present

## 2015-10-14 DIAGNOSIS — H02403 Unspecified ptosis of bilateral eyelids: Secondary | ICD-10-CM | POA: Diagnosis not present

## 2015-10-14 DIAGNOSIS — R0683 Snoring: Secondary | ICD-10-CM | POA: Diagnosis not present

## 2015-10-14 DIAGNOSIS — E785 Hyperlipidemia, unspecified: Secondary | ICD-10-CM | POA: Diagnosis not present

## 2015-10-14 DIAGNOSIS — Z888 Allergy status to other drugs, medicaments and biological substances status: Secondary | ICD-10-CM | POA: Diagnosis not present

## 2015-10-14 DIAGNOSIS — Z96643 Presence of artificial hip joint, bilateral: Secondary | ICD-10-CM | POA: Diagnosis not present

## 2015-10-14 DIAGNOSIS — M8580 Other specified disorders of bone density and structure, unspecified site: Secondary | ICD-10-CM | POA: Diagnosis not present

## 2015-10-14 DIAGNOSIS — Z96612 Presence of left artificial shoulder joint: Secondary | ICD-10-CM | POA: Diagnosis not present

## 2015-10-14 DIAGNOSIS — Z79899 Other long term (current) drug therapy: Secondary | ICD-10-CM | POA: Diagnosis not present

## 2015-10-14 DIAGNOSIS — Z9181 History of falling: Secondary | ICD-10-CM | POA: Diagnosis not present

## 2015-11-07 DIAGNOSIS — Z853 Personal history of malignant neoplasm of breast: Secondary | ICD-10-CM | POA: Diagnosis not present

## 2015-11-07 DIAGNOSIS — Z1231 Encounter for screening mammogram for malignant neoplasm of breast: Secondary | ICD-10-CM | POA: Diagnosis not present

## 2015-11-07 LAB — HM MAMMOGRAPHY

## 2015-12-09 ENCOUNTER — Encounter: Payer: Self-pay | Admitting: Internal Medicine

## 2015-12-27 DIAGNOSIS — Z23 Encounter for immunization: Secondary | ICD-10-CM | POA: Diagnosis not present

## 2016-01-18 DIAGNOSIS — N3945 Continuous leakage: Secondary | ICD-10-CM | POA: Diagnosis not present

## 2016-01-18 DIAGNOSIS — Z6829 Body mass index (BMI) 29.0-29.9, adult: Secondary | ICD-10-CM | POA: Diagnosis not present

## 2016-01-18 DIAGNOSIS — Z124 Encounter for screening for malignant neoplasm of cervix: Secondary | ICD-10-CM | POA: Diagnosis not present

## 2016-03-21 DIAGNOSIS — Z961 Presence of intraocular lens: Secondary | ICD-10-CM | POA: Diagnosis not present

## 2016-03-26 ENCOUNTER — Ambulatory Visit (INDEPENDENT_AMBULATORY_CARE_PROVIDER_SITE_OTHER): Payer: Medicare Other | Admitting: Internal Medicine

## 2016-03-26 ENCOUNTER — Encounter: Payer: Self-pay | Admitting: Internal Medicine

## 2016-03-26 DIAGNOSIS — B9789 Other viral agents as the cause of diseases classified elsewhere: Secondary | ICD-10-CM

## 2016-03-26 DIAGNOSIS — J069 Acute upper respiratory infection, unspecified: Secondary | ICD-10-CM

## 2016-03-26 NOTE — Progress Notes (Signed)
Pre visit review using our clinic review tool, if applicable. No additional management support is needed unless otherwise documented below in the visit note. 

## 2016-03-26 NOTE — Patient Instructions (Signed)
You can start using something like zyrtec (cetirizine is the generic name).   Upper Respiratory Infection, Adult Most upper respiratory infections (URIs) are a viral infection of the air passages leading to the lungs. A URI affects the nose, throat, and upper air passages. The most common type of URI is nasopharyngitis and is typically referred to as "the common cold." URIs run their course and usually go away on their own. Most of the time, a URI does not require medical attention, but sometimes a bacterial infection in the upper airways can follow a viral infection. This is called a secondary infection. Sinus and middle ear infections are common types of secondary upper respiratory infections. Bacterial pneumonia can also complicate a URI. A URI can worsen asthma and chronic obstructive pulmonary disease (COPD). Sometimes, these complications can require emergency medical care and may be life threatening. What are the causes? Almost all URIs are caused by viruses. A virus is a type of germ and can spread from one person to another. What increases the risk? You may be at risk for a URI if:  You smoke.  You have chronic heart or lung disease.  You have a weakened defense (immune) system.  You are very young or very old.  You have nasal allergies or asthma.  You work in crowded or poorly ventilated areas.  You work in health care facilities or schools. What are the signs or symptoms? Symptoms typically develop 2-3 days after you come in contact with a cold virus. Most viral URIs last 7-10 days. However, viral URIs from the influenza virus (flu virus) can last 14-18 days and are typically more severe. Symptoms may include:  Runny or stuffy (congested) nose.  Sneezing.  Cough.  Sore throat.  Headache.  Fatigue.  Fever.  Loss of appetite.  Pain in your forehead, behind your eyes, and over your cheekbones (sinus pain).  Muscle aches. How is this diagnosed? Your health care  provider may diagnose a URI by:  Physical exam.  Tests to check that your symptoms are not due to another condition such as:  Strep throat.  Sinusitis.  Pneumonia.  Asthma. How is this treated? A URI goes away on its own with time. It cannot be cured with medicines, but medicines may be prescribed or recommended to relieve symptoms. Medicines may help:  Reduce your fever.  Reduce your cough.  Relieve nasal congestion. Follow these instructions at home:  Take medicines only as directed by your health care provider.  Gargle warm saltwater or take cough drops to comfort your throat as directed by your health care provider.  Use a warm mist humidifier or inhale steam from a shower to increase air moisture. This may make it easier to breathe.  Drink enough fluid to keep your urine clear or pale yellow.  Eat soups and other clear broths and maintain good nutrition.  Rest as needed.  Return to work when your temperature has returned to normal or as your health care provider advises. You may need to stay home longer to avoid infecting others. You can also use a face mask and careful hand washing to prevent spread of the virus.  Increase the usage of your inhaler if you have asthma.  Do not use any tobacco products, including cigarettes, chewing tobacco, or electronic cigarettes. If you need help quitting, ask your health care provider. How is this prevented? The best way to protect yourself from getting a cold is to practice good hygiene.  Avoid oral or  hand contact with people with cold symptoms.  Wash your hands often if contact occurs. There is no clear evidence that vitamin C, vitamin E, echinacea, or exercise reduces the chance of developing a cold. However, it is always recommended to get plenty of rest, exercise, and practice good nutrition. Contact a health care provider if:  You are getting worse rather than better.  Your symptoms are not controlled by  medicine.  You have chills.  You have worsening shortness of breath.  You have brown or red mucus.  You have yellow or brown nasal discharge.  You have pain in your face, especially when you bend forward.  You have a fever.  You have swollen neck glands.  You have pain while swallowing.  You have white areas in the back of your throat. Get help right away if:  You have severe or persistent:  Headache.  Ear pain.  Sinus pain.  Chest pain.  You have chronic lung disease and any of the following:  Wheezing.  Prolonged cough.  Coughing up blood.  A change in your usual mucus.  You have a stiff neck.  You have changes in your:  Vision.  Hearing.  Thinking.  Mood. This information is not intended to replace advice given to you by your health care provider. Make sure you discuss any questions you have with your health care provider. Document Released: 07/25/2000 Document Revised: 10/02/2015 Document Reviewed: 05/06/2013 Elsevier Interactive Patient Education  2017 [employer].

## 2016-03-26 NOTE — Progress Notes (Signed)
   Subjective:    Patient ID: [name] Price, female    DOB: 03/22/1940, 76 y.o.   MRN: [id number]  HPI The patient is a 76 YO female coming in for sore throat for about 3 days. Started having some headaches today, also coughing and sneezing. She is having some drainage as well. Denies fevers or chills. Has not tried anything except some cough medicine which is working somewhat. She is overall having improvement gradual in her symptoms.   Review of Systems  Constitutional: Positive for activity change, appetite change and fatigue. Negative for chills, fever and unexpected weight change.  HENT: Positive for congestion, postnasal drip and rhinorrhea. Negative for ear discharge, ear pain, sinus pain, sinus pressure, sore throat and trouble swallowing.   Eyes: Negative.   Respiratory: Positive for cough. Negative for chest tightness, shortness of breath and wheezing.   Cardiovascular: Negative.   Gastrointestinal: Negative.   Musculoskeletal: Negative.   Neurological: Negative.       Objective:   Physical Exam  Constitutional: She is oriented to person, place, and time. She appears well-developed and well-nourished.  HENT:  Head: Normocephalic and atraumatic.  Oropharynx with redness and clear drainage, no sinus pressure or nose crusting.   Eyes: EOM are normal.  Neck: Normal range of motion. No JVD present.  Cardiovascular: Normal rate and regular rhythm.   Pulmonary/Chest: Effort normal and breath sounds normal. No respiratory distress. She has no wheezes. She has no rales. She exhibits no tenderness.  Abdominal: Soft.  Lymphadenopathy:    She has no cervical adenopathy.  Neurological: She is alert and oriented to person, place, and time. Coordination normal.  Skin: Skin is warm and dry.   Vitals:   03/26/16 1443  BP: 122/80  Pulse: 85  Temp: 98.4 F (36.9 C)  TempSrc: Oral  SpO2: 98%  Weight: 164 lb (74.4 kg)  Height: 5\' 3"  (1.6 m)      Assessment & Plan:

## 2016-03-28 NOTE — Assessment & Plan Note (Signed)
Outside window for flu treatment although it does not sound like flu. Advised to take zyrtec over the counter and continue with over the counter cough medicine as she has codeine allergy and does not want rx cough syrup. Advised about typical course and recovery.

## 2016-05-28 ENCOUNTER — Ambulatory Visit: Payer: Medicare Other

## 2016-06-15 ENCOUNTER — Ambulatory Visit (INDEPENDENT_AMBULATORY_CARE_PROVIDER_SITE_OTHER): Payer: Medicare Other

## 2016-06-15 VITALS — BP 118/78 | HR 81 | Temp 98.4°F | Ht 61.5 in | Wt 161.8 lb

## 2016-06-15 DIAGNOSIS — Z0000 Encounter for general adult medical examination without abnormal findings: Secondary | ICD-10-CM | POA: Diagnosis not present

## 2016-06-15 DIAGNOSIS — E559 Vitamin D deficiency, unspecified: Secondary | ICD-10-CM | POA: Diagnosis not present

## 2016-06-15 DIAGNOSIS — E7800 Pure hypercholesterolemia, unspecified: Secondary | ICD-10-CM | POA: Diagnosis not present

## 2016-06-15 LAB — CBC WITH DIFFERENTIAL/PLATELET
Basophils Absolute: 0.1 10*3/uL (ref 0.0–0.1)
Basophils Relative: 1.2 % (ref 0.0–3.0)
EOS ABS: 0.1 10*3/uL (ref 0.0–0.7)
Eosinophils Relative: 2.5 % (ref 0.0–5.0)
HCT: 42.9 % (ref 36.0–46.0)
Hemoglobin: 14.2 g/dL (ref 12.0–15.0)
LYMPHS ABS: 1.3 10*3/uL (ref 0.7–4.0)
Lymphocytes Relative: 27.6 % (ref 12.0–46.0)
MCHC: 33.1 g/dL (ref 30.0–36.0)
MCV: 93 fl (ref 78.0–100.0)
MONO ABS: 0.4 10*3/uL (ref 0.1–1.0)
Monocytes Relative: 9 % (ref 3.0–12.0)
NEUTROS ABS: 2.8 10*3/uL (ref 1.4–7.7)
NEUTROS PCT: 59.7 % (ref 43.0–77.0)
PLATELETS: 208 10*3/uL (ref 150.0–400.0)
RBC: 4.61 Mil/uL (ref 3.87–5.11)
RDW: 13.8 % (ref 11.5–15.5)
WBC: 4.7 10*3/uL (ref 4.0–10.5)

## 2016-06-15 LAB — LIPID PANEL
CHOLESTEROL: 174 mg/dL (ref 0–200)
HDL: 56.5 mg/dL (ref >39.00)
LDL Cholesterol: 104 mg/dL — ABNORMAL HIGH (ref 0–99)
NONHDL: 117.48
TRIGLYCERIDES: 69 mg/dL (ref 0.0–149.0)
Total CHOL/HDL Ratio: 3
VLDL: 13.8 mg/dL (ref 0.0–40.0)

## 2016-06-15 LAB — COMPREHENSIVE METABOLIC PANEL
ALK PHOS: 53 U/L (ref 39–117)
ALT: 14 U/L (ref 0–35)
AST: 20 U/L (ref 0–37)
Albumin: 4.1 g/dL (ref 3.5–5.2)
BUN: 20 mg/dL (ref 6–23)
CO2: 30 meq/L (ref 19–32)
CREATININE: 0.66 mg/dL (ref 0.40–1.20)
Calcium: 10 mg/dL (ref 8.4–10.5)
Chloride: 106 mEq/L (ref 96–112)
GFR: 93.2 mL/min (ref >60.00)
GLUCOSE: 94 mg/dL (ref 70–99)
Potassium: 4.2 mEq/L (ref 3.5–5.1)
Sodium: 142 mEq/L (ref 135–145)
TOTAL PROTEIN: 6.4 g/dL (ref 6.0–8.3)
Total Bilirubin: 0.8 mg/dL (ref 0.2–1.2)

## 2016-06-15 LAB — VITAMIN D 25 HYDROXY (VIT D DEFICIENCY, FRACTURES): VITD: 67.09 ng/mL (ref 30.00–100.00)

## 2016-06-15 NOTE — Progress Notes (Signed)
PCP notes:   Health maintenance:  No gaps identified.   Abnormal screenings:   Hearing - failed  Patient concerns:   None  Nurse concerns:  None  Next PCP appt:   None; pt states if she gets sick, she will make an appt with PCP  I reviewed health advisor's note, was available for consultation on the day of service listed in this note, and agree with documentation and plan. Elsie Stain, MD.

## 2016-06-15 NOTE — Progress Notes (Signed)
Pre visit review using our clinic review tool, if applicable. No additional management support is needed unless otherwise documented below in the visit note. 

## 2016-06-15 NOTE — Progress Notes (Signed)
Subjective:   [name] Price is a 76 y.o. female who presents for Medicare Annual (Subsequent) preventive examination.  Review of Systems:  N/A Cardiac Risk Factors include: advanced age (>19men, >39 women);dyslipidemia     Objective:     Vitals: BP 118/78 (BP Location: Right Arm, Patient Position: Sitting, Cuff Size: Normal)   Pulse 81   Temp 98.4 F (36.9 C) (Oral)   Ht 5' 1.5" (1.562 m) Comment: no shoes  Wt 161 lb 12 oz (73.4 kg)   SpO2 97%   BMI 30.07 kg/m   Body mass index is 30.07 kg/m.   Tobacco History  Smoking Status  . Never Smoker  Smokeless Tobacco  . Never Used     Counseling given: No   Past Medical History:  Diagnosis Date  . Allergy   . Anxiety   . Arthritis   . Blood transfusion without reported diagnosis    as baby  . Breast cancer ([place])    s/p lumpectomy and radiation, no chemo-right  . History of chicken pox   . Hyperlipidemia   . Osteopenia   . UTI (urinary tract infection)    Past Surgical History:  Procedure Laterality Date  . BREAST SURGERY Right   . CATARACT EXTRACTION W/PHACO Left 12/28/2014   Procedure: CATARACT EXTRACTION PHACO AND INTRAOCULAR LENS PLACEMENT (IOC);  Surgeon: Birder Robson, MD;  Location: [facility] ORS;  Service: Ophthalmology;  Laterality: Left;  [place] 00:38 AP% 19.9 CDE 7.70 fluid pack lot # [id number]  . COLONOSCOPY    . JOINT REPLACEMENT Bilateral    hip  / shoulder  . OOPHORECTOMY Left   . POLYPECTOMY    . TOTAL HIP ARTHROPLASTY Bilateral    (2) Two  . TOTAL SHOULDER REPLACEMENT     Family History  Problem Relation Age of Onset  . Lung cancer Mother   . Heart disease Mother   . Colon cancer Mother   . Cancer Father   . Colon cancer Father   . Breast cancer Maternal Aunt   . Breast cancer Maternal Aunt   . Breast cancer Maternal Aunt   . Rectal cancer Neg Hx   . Stomach cancer Neg Hx    History  Sexual Activity  . Sexual activity: No    Outpatient Encounter Prescriptions as of 06/15/2016    Medication Sig  . b complex vitamins capsule Take 1 capsule by mouth daily.  . bisacodyl (DULCOLAX) 5 MG EC tablet Take 5 mg by mouth daily as needed for moderate constipation. For double prep 2-22 and 2-24 then stop  . calcium carbonate (OS-CAL) 600 MG TABS Take 600 mg by mouth 2 (two) times daily with a meal.  . Cholecalciferol (VITAMIN D) 2000 units tablet Take 2,000 Units by mouth daily.  . Cyanocobalamin (VITAMIN B-12 CR PO) Take by mouth.  . Multiple Vitamins-Minerals (CENTRUM SILVER ULTRA WOMENS PO) Take 1 tablet by mouth daily.  . naproxen sodium (ANAPROX) 220 MG tablet Take 220 mg by mouth 2 (two) times daily as needed (with meals).   Marland Kitchen PARoxetine (PAXIL-CR) 25 MG 24 hr tablet Take 25 mg by mouth daily.  Marland Kitchen UNABLE TO FIND 4 scoop every morning. Reported on 05/30/2015   No facility-administered encounter medications on file as of 06/15/2016.     Activities of Daily Living In your present state of health, do you have any difficulty performing the following activities: 06/15/2016  Hearing? N  Vision? Y  Difficulty concentrating or making decisions? N  Walking or climbing  stairs? N  Dressing or bathing? N  Doing errands, shopping? N  Preparing Food and eating ? N  Using the Toilet? N  In the past six months, have you accidently leaked urine? N  Do you have problems with loss of bowel control? N  Managing your Medications? N  Managing your Finances? N  Housekeeping or managing your Housekeeping? N  Some recent data might be hidden    Patient Care Team: Tonia Ghent, MD as PCP - General (Family Medicine)    Assessment:     Hearing Screening   125Hz  250Hz  500Hz  1000Hz  2000Hz  3000Hz  4000Hz  6000Hz  8000Hz   Right ear:   0 0 40  0    Left ear:   40 40 40  0    Vision Screening Comments: Vision exam scheduled for Jun 22, 2016 with Dr. George Ina   Exercise Activities and Dietary recommendations Current Exercise Habits: Home exercise routine, Type of exercise: walking, Time  (Minutes): 30, Frequency (Times/Week): 7, Weekly Exercise (Minutes/Week): 210, Intensity: Mild, Exercise limited by: None identified  Goals    . Weight < 151 lb (68.493 kg)          Target weight is 150 lbs. Starting 06/15/2016, I will continue to walk twice daily for at least 30 minutes.       Fall Risk Fall Risk  06/15/2016 05/30/2015  Falls in the past year? No No   Depression Screen PHQ 2/9 Scores 06/15/2016 05/30/2015  PHQ - 2 Score 0 0     Cognitive Function MMSE - Mini Mental State Exam 06/15/2016 05/30/2015  Orientation to time 5 5  Orientation to Place 5 5  Registration 3 3  Attention/ Calculation 0 0  Recall 3 2  Recall-comments - recalled 2 of 3 words without cue  Language- name 2 objects 0 0  Language- repeat 1 1  Language- follow 3 step command 3 3  Language- read & follow direction 0 0  Write a sentence 0 0  Copy design 0 0  Total score 20 19     PLEASE NOTE: A Mini-Cog screen was completed. Maximum score is 20. A value of 0 denotes this part of Folstein MMSE was not completed or the patient failed this part of the Mini-Cog screening.   Mini-Cog Screening Orientation to Time - Max 5 pts Orientation to Place - Max 5 pts Registration - Max 3 pts Recall - Max 3 pts Language Repeat - Max 1 pts Language Follow 3 Step Command - Max 3 pts     Immunization History  Administered Date(s) Administered  . Influenza Split 11/10/2010  . Influenza Whole 11/13/2007  . Influenza, High Dose Seasonal PF 12/27/2015  . Influenza-Unspecified 11/12/2013  . Pneumococcal Conjugate-13 05/30/2015  . Pneumococcal Polysaccharide-23 03/03/2013  . Td 02/13/1991, 03/14/2015   Screening Tests Health Maintenance  Topic Date Due  . INFLUENZA VACCINE  09/12/2016  . COLONOSCOPY  04/07/2017  . MAMMOGRAM  11/06/2017  . TETANUS/TDAP  03/13/2025  . DEXA SCAN  Completed  . PNA vac Low Risk Adult  Completed      Plan:     I have personally reviewed and addressed the Medicare Annual  Wellness questionnaire and have noted the following in the patient's chart:  A. Medical and social history B. Use of alcohol, tobacco or illicit drugs  C. Current medications and supplements D. Functional ability and status E.  Nutritional status F.  Physical activity G. Advance directives H. List of other physicians I.  Hospitalizations,  surgeries, and ER visits in previous 12 months J.  [place] to include hearing, vision, cognitive, depression L. Referrals and appointments - none  In addition, I have reviewed and discussed with patient certain preventive protocols, quality metrics, and best practice recommendations. A written personalized care plan for preventive services as well as general preventive health recommendations were provided to patient.  See attached scanned questionnaire for additional information.   Signed,   Lindell Noe, MHA, BS, LPN Health Coach

## 2016-06-15 NOTE — Patient Instructions (Addendum)
[name] Price , Thank you for taking time to come for your Medicare Wellness Visit. I appreciate your ongoing commitment to your health goals. Please review the following plan we discussed and let me know if I can assist you in the future.   These are the goals we discussed: Goals    . Weight < 151 lb (68.493 kg)          Target weight is 150 lbs. Starting 06/15/2016, I will continue to walk twice daily for at least 30 minutes.        This is a list of the screening recommended for you and due dates:  Health Maintenance  Topic Date Due  . Flu Shot  09/12/2016  . Colon Cancer Screening  04/07/2017  . Mammogram  11/06/2017  . Tetanus Vaccine  03/13/2025  . DEXA scan (bone density measurement)  Completed  . Pneumonia vaccines  Completed     Preventive Care for Adults  A healthy lifestyle and preventive care can promote health and wellness. Preventive health guidelines for adults include the following key practices.  . A routine yearly physical is a good way to check with your health care provider about your health and preventive screening. It is a chance to share any concerns and updates on your health and to receive a thorough exam.  . Visit your dentist for a routine exam and preventive care every 6 months. Brush your teeth twice a day and floss once a day. Good oral hygiene prevents tooth decay and gum disease.  . The frequency of eye exams is based on your age, health, family medical history, use  of contact lenses, and other factors. Follow your health care provider's ecommendations for frequency of eye exams.  . Eat a healthy diet. Foods like vegetables, fruits, whole grains, low-fat dairy products, and lean protein foods contain the nutrients you need without too many calories. Decrease your intake of foods high in solid fats, added sugars, and salt. Eat the right amount of calories for you. Get information about a proper diet from your health care provider, if necessary.  .  Regular physical exercise is one of the most important things you can do for your health. Most adults should get at least 150 minutes of moderate-intensity exercise (any activity that increases your heart rate and causes you to sweat) each week. In addition, most adults need muscle-strengthening exercises on 2 or more days a week.  Silver Sneakers may be a benefit available to you. To determine eligibility, you may visit the website: www.silversneakers.com or contact program at [phone] Mon-Fri between 8AM-8PM.   . Maintain a healthy weight. The body mass index (BMI) is a screening tool to identify possible weight problems. It provides an estimate of body fat based on height and weight. Your health care provider can find your BMI and can help you achieve or maintain a healthy weight.   For adults 20 years and older: ? A BMI below 18.5 is considered underweight. ? A BMI of 18.5 to 24.9 is normal. ? A BMI of 25 to 29.9 is considered overweight. ? A BMI of 30 and above is considered obese.   . Maintain normal blood lipids and cholesterol levels by exercising and minimizing your intake of saturated fat. Eat a balanced diet with plenty of fruit and vegetables. Blood tests for lipids and cholesterol should begin at age 31 and be repeated every 5 years. If your lipid or cholesterol levels are high, you are over 50, or  you are at high risk for heart disease, you may need your cholesterol levels checked more frequently. Ongoing high lipid and cholesterol levels should be treated with medicines if diet and exercise are not working.  . If you smoke, find out from your health care provider how to quit. If you do not use tobacco, please do not start.  . If you choose to drink alcohol, please do not consume more than 2 drinks per day. One drink is considered to be 12 ounces (355 mL) of beer, 5 ounces (148 mL) of wine, or 1.5 ounces (44 mL) of liquor.  . If you are 94-53 years old, ask your health care  provider if you should take aspirin to prevent strokes.  . Use sunscreen. Apply sunscreen liberally and repeatedly throughout the day. You should seek shade when your shadow is shorter than you. Protect yourself by wearing long sleeves, pants, a wide-brimmed hat, and sunglasses year round, whenever you are outdoors.  . Once a month, do a whole body skin exam, using a mirror to look at the skin on your back. Tell your health care provider of new moles, moles that have irregular borders, moles that are larger than a pencil eraser, or moles that have changed in shape or color.

## 2016-06-18 ENCOUNTER — Encounter: Payer: Self-pay | Admitting: *Deleted

## 2016-06-18 ENCOUNTER — Telehealth: Payer: Self-pay | Admitting: *Deleted

## 2016-06-20 ENCOUNTER — Encounter: Payer: Self-pay | Admitting: Family Medicine

## 2016-06-20 ENCOUNTER — Ambulatory Visit (INDEPENDENT_AMBULATORY_CARE_PROVIDER_SITE_OTHER): Payer: Medicare Other | Admitting: Family Medicine

## 2016-06-20 DIAGNOSIS — S9001XA Contusion of right ankle, initial encounter: Secondary | ICD-10-CM

## 2016-06-20 MED ORDER — CEPHALEXIN 500 MG PO CAPS: 500.0000 mg | ORAL_CAPSULE | Freq: Four times a day (QID) | ORAL | 0 refills | Status: DC

## 2016-06-20 NOTE — Progress Notes (Signed)
She accidentally closed door on R medial ankle 2 weeks ago, it bruised but that got better.  Now it started back bleeding in the last 3 days.  No secondary trauma.    No FCNAVD.  Can still bear weight.  Was always able to walk.    Not draining pus  Meds, vitals, and allergies reviewed.   ROS: Per HPI unless specifically indicated in ROS section   nad ncat Speech wnl Able to bear weight.  R foot with normal inspection except for the R medial ankle with an area of irritation with central scab removed.  This still looks to be superficial, there is no deep central ulceration. Not bleeding at time of exam. No pus. No discharge. No fluctuant mass.  No spreading erythema. Not tender to palpation. Intact dorsalis his pulse. Neurovascular intact otherwise in the foot.

## 2016-06-20 NOTE — Progress Notes (Signed)
Pre visit review using our clinic review tool, if applicable. No additional management support is needed unless otherwise documented below in the visit note. 

## 2016-06-20 NOTE — Patient Instructions (Signed)
Keep it covered, wear shoes that don't rub, start keflex, and update me in a few days.  Take care.  Glad to see you.

## 2016-06-21 DIAGNOSIS — S9000XA Contusion of unspecified ankle, initial encounter: Secondary | ICD-10-CM | POA: Insufficient documentation

## 2016-06-21 DIAGNOSIS — H2511 Age-related nuclear cataract, right eye: Secondary | ICD-10-CM | POA: Diagnosis not present

## 2016-06-21 NOTE — Telephone Encounter (Signed)
ERRONEOUS ENCOUNTER

## 2016-06-21 NOTE — Assessment & Plan Note (Signed)
He she had a traumatic event where the door hit the medial portion of her right ankle. She had bruising subsequently. I think she had a complication of a superficial hematoma, where she developed a scab but then had bleeding after the scab was removed. This does not look to be a deep lesion. She is not tender. No spreading erythema. It is not draining pus. Covered with nonstick bandage. Wrapped in [place]. Wash gently and change dressing daily. Would start Keflex only because of the location and the duration. I still expect this to heal well. Update [place] as needed. She agrees.

## 2016-07-03 DIAGNOSIS — H2511 Age-related nuclear cataract, right eye: Secondary | ICD-10-CM | POA: Diagnosis not present

## 2016-07-04 ENCOUNTER — Encounter: Payer: Self-pay | Admitting: *Deleted

## 2016-07-10 ENCOUNTER — Ambulatory Visit
Admission: RE | Admit: 2016-07-10 | Discharge: 2016-07-10 | Disposition: A | Discharge status: Home / Self Care | Payer: Medicare Other | Source: Ambulatory Visit | Attending: Ophthalmology | Admitting: Ophthalmology

## 2016-07-10 ENCOUNTER — Encounter: Payer: Self-pay | Admitting: *Deleted

## 2016-07-10 ENCOUNTER — Ambulatory Visit: Payer: Medicare Other | Admitting: Anesthesiology

## 2016-07-10 ENCOUNTER — Encounter
Admission: RE | Disposition: A | Discharge status: Home / Self Care | Payer: Self-pay | Source: Ambulatory Visit | Attending: Ophthalmology

## 2016-07-10 DIAGNOSIS — Z96643 Presence of artificial hip joint, bilateral: Secondary | ICD-10-CM | POA: Insufficient documentation

## 2016-07-10 DIAGNOSIS — E785 Hyperlipidemia, unspecified: Secondary | ICD-10-CM | POA: Diagnosis not present

## 2016-07-10 DIAGNOSIS — Z79899 Other long term (current) drug therapy: Secondary | ICD-10-CM | POA: Diagnosis not present

## 2016-07-10 DIAGNOSIS — F419 Anxiety disorder, unspecified: Secondary | ICD-10-CM | POA: Insufficient documentation

## 2016-07-10 DIAGNOSIS — Z791 Long term (current) use of non-steroidal anti-inflammatories (NSAID): Secondary | ICD-10-CM | POA: Insufficient documentation

## 2016-07-10 DIAGNOSIS — Z96619 Presence of unspecified artificial shoulder joint: Secondary | ICD-10-CM | POA: Diagnosis not present

## 2016-07-10 DIAGNOSIS — H2511 Age-related nuclear cataract, right eye: Secondary | ICD-10-CM | POA: Diagnosis not present

## 2016-07-10 HISTORY — PX: CATARACT EXTRACTION W/PHACO: SHX586

## 2016-07-10 SURGERY — PHACOEMULSIFICATION, CATARACT, WITH IOL INSERTION
Anesthesia: Monitor Anesthesia Care | Site: Eye | Laterality: Right | Wound class: Clean

## 2016-07-10 MED ORDER — ARMC OPHTHALMIC DILATING DROPS
OPHTHALMIC | Status: AC
Start: 2016-07-10 — End: 2016-07-10
  Administered 2016-07-10: 1 via OPHTHALMIC
  Filled 2016-07-10: qty 0.4

## 2016-07-10 MED ORDER — POVIDONE-IODINE 5 % OP SOLN
OPHTHALMIC | Status: AC
  Filled 2016-07-10: qty 30

## 2016-07-10 MED ORDER — EPINEPHRINE PF 1 MG/ML IJ SOLN
INTRAOCULAR | Status: DC | PRN
  Administered 2016-07-10: 10:00:00 via OPHTHALMIC

## 2016-07-10 MED ORDER — NA CHONDROIT SULF-NA HYALURON 40-17 MG/ML IO SOLN
INTRAOCULAR | Status: AC
  Filled 2016-07-10: qty 1

## 2016-07-10 MED ORDER — LIDOCAINE HCL (PF) 2 % IJ SOLN
INTRAMUSCULAR | Status: AC
  Filled 2016-07-10: qty 2

## 2016-07-10 MED ORDER — SODIUM CHLORIDE 0.9 % IV SOLN
INTRAVENOUS | Status: DC
  Administered 2016-07-10: 09:00:00 via INTRAVENOUS

## 2016-07-10 MED ORDER — ARMC OPHTHALMIC DILATING DROPS
1.0000 "application " | OPHTHALMIC | Status: AC
  Administered 2016-07-10 (×3): 1 via OPHTHALMIC

## 2016-07-10 MED ORDER — POVIDONE-IODINE 5 % OP SOLN
OPHTHALMIC | Status: DC | PRN
  Administered 2016-07-10: 1 via OPHTHALMIC

## 2016-07-10 MED ORDER — EPINEPHRINE PF 1 MG/ML IJ SOLN
INTRAMUSCULAR | Status: AC
  Filled 2016-07-10: qty 2

## 2016-07-10 MED ORDER — FENTANYL CITRATE (PF) 100 MCG/2ML IJ SOLN
INTRAMUSCULAR | Status: DC | PRN
  Administered 2016-07-10: 25 ug via INTRAVENOUS
  Administered 2016-07-10: 50 ug via INTRAVENOUS

## 2016-07-10 MED ORDER — FENTANYL CITRATE (PF) 100 MCG/2ML IJ SOLN
INTRAMUSCULAR | Status: AC
  Filled 2016-07-10: qty 2

## 2016-07-10 MED ORDER — CARBACHOL 0.01 % IO SOLN
INTRAOCULAR | Status: DC | PRN
  Administered 2016-07-10: 0.5 mL via INTRAOCULAR

## 2016-07-10 MED ORDER — MOXIFLOXACIN HCL 0.5 % OP SOLN
OPHTHALMIC | Status: DC | PRN
  Administered 2016-07-10: 0.2 mL via OPHTHALMIC

## 2016-07-10 MED ORDER — MOXIFLOXACIN HCL 0.5 % OP SOLN
OPHTHALMIC | Status: DC
Start: 2016-07-10 — End: 2016-07-10
  Filled 2016-07-10: qty 3

## 2016-07-10 MED ORDER — MOXIFLOXACIN HCL 0.5 % OP SOLN: 1.0000 [drp] | OPHTHALMIC | Status: DC | PRN

## 2016-07-10 MED ORDER — NA CHONDROIT SULF-NA HYALURON 40-17 MG/ML IO SOLN
INTRAOCULAR | Status: DC | PRN
  Administered 2016-07-10: 1 mL via INTRAOCULAR

## 2016-07-10 MED ORDER — LIDOCAINE HCL (PF) 4 % IJ SOLN
INTRAOCULAR | Status: DC | PRN
  Administered 2016-07-10: 9 mL via OPHTHALMIC

## 2016-07-10 SURGICAL SUPPLY — 14 items
GLOVE BIO SURGEON STRL SZ8 (GLOVE) ×2 IMPLANT
GLOVE BIOGEL M 6.5 STRL (GLOVE) ×2 IMPLANT
GLOVE SURG LX 8.0 MICRO (GLOVE) ×1
GLOVE SURG LX STRL 8.0 MICRO (GLOVE) ×1 IMPLANT
GOWN STRL REUS W/ TWL LRG LVL3 (GOWN DISPOSABLE) ×2 IMPLANT
GOWN STRL REUS W/TWL LRG LVL3 (GOWN DISPOSABLE) ×2
LENS IOL TECNIS ITEC 16.5 (Intraocular Lens) ×2 IMPLANT
PACK CATARACT (MISCELLANEOUS) ×2 IMPLANT
PACK CATARACT BRASINGTON LX (MISCELLANEOUS) ×2 IMPLANT
SOL BSS BAG (MISCELLANEOUS) ×2
SOLUTION BSS BAG (MISCELLANEOUS) ×1 IMPLANT
SYR 5ML LL (SYRINGE) ×2 IMPLANT
WATER STERILE IRR 250ML POUR (IV SOLUTION) ×2 IMPLANT
WIPE NON LINTING 3.25X3.25 (MISCELLANEOUS) ×2 IMPLANT

## 2016-07-10 NOTE — Transfer of Care (Signed)
Immediate Anesthesia Transfer of Care Note  Patient: [name] Price  Procedure(s) Performed: Procedure(s) with comments: CATARACT EXTRACTION PHACO AND INTRAOCULAR LENS PLACEMENT (IOC) (Right) - [place] 00:38 AP% 18.3 CDE 7.06 Fluid pack lot # [id number]  Patient Location: PACU  Anesthesia Type:MAC  Level of Consciousness: awake, alert  and oriented  Airway & Oxygen Therapy: Patient Spontanous Breathing  Post-op Assessment: Report given to RN and Post -op Vital signs reviewed and stable  Post vital signs: Reviewed and stable  Last Vitals:  Vitals:   07/10/16 0837 07/10/16 1014  BP: 128/70 138/68  Pulse: 71 78  Resp: 18 18  Temp: (!) 36.1 C     Last Pain:  Vitals:   07/10/16 0837  TempSrc: Tympanic         Complications: No apparent anesthesia complications

## 2016-07-10 NOTE — Anesthesia Post-op Follow-up Note (Cosign Needed)
Anesthesia QCDR form completed.        

## 2016-07-10 NOTE — Anesthesia Postprocedure Evaluation (Signed)
Anesthesia Post Note  Patient: [name] Price  Procedure(s) Performed: Procedure(s) (LRB): CATARACT EXTRACTION PHACO AND INTRAOCULAR LENS PLACEMENT (IOC) (Right)  Patient location during evaluation: Short Stay Anesthesia Type: MAC Level of consciousness: awake, awake and alert and oriented Pain management: pain level controlled Vital Signs Assessment: post-procedure vital signs reviewed and stable Respiratory status: spontaneous breathing Cardiovascular status: stable Postop Assessment: no headache and adequate PO intake Anesthetic complications: no     Last Vitals:  Vitals:   07/10/16 1014 07/10/16 1017  BP: 138/68 138/68  Pulse: 78 78  Resp: 18 18  Temp:  36.7 C    Last Pain:  Vitals:   07/10/16 0837  TempSrc: Tympanic                 Lanora Manis

## 2016-07-10 NOTE — Op Note (Signed)
PREOPERATIVE DIAGNOSIS:  Nuclear sclerotic cataract of the right eye.   POSTOPERATIVE DIAGNOSIS:  NUCLEAR SCLEROTIC CATARACT RIGHT EYE   OPERATIVE PROCEDURE: Procedure(s): CATARACT EXTRACTION PHACO AND INTRAOCULAR LENS PLACEMENT (IOC)   SURGEON:  Birder Robson, MD.   ANESTHESIA:  Anesthesiologist: Piscitello, Precious Haws, MD CRNA: Jonna Clark, CRNA  1.      Managed anesthesia care. 2.      0.71ml of Shugarcaine was instilled in the eye following the paracentesis.   COMPLICATIONS:  None.   TECHNIQUE:   Stop and chop   DESCRIPTION OF PROCEDURE:  The patient was examined and consented in the preoperative holding area where the aforementioned topical anesthesia was applied to the right eye and then brought back to the Operating Room where the right eye was prepped and draped in the usual sterile ophthalmic fashion and a lid speculum was placed. A paracentesis was created with the side port blade and the anterior chamber was filled with viscoelastic. A near clear corneal incision was performed with the steel keratome. A continuous curvilinear capsulorrhexis was performed with a cystotome followed by the capsulorrhexis forceps. Hydrodissection and hydrodelineation were carried out with BSS on a blunt cannula. The lens was removed in a stop and chop  technique and the remaining cortical material was removed with the irrigation-aspiration handpiece. The capsular bag was inflated with viscoelastic and the Technis [id number]  lens was placed in the capsular bag without complication. The remaining viscoelastic was removed from the eye with the irrigation-aspiration handpiece. The wounds were hydrated. The anterior chamber was flushed with Miostat and the eye was inflated to physiologic pressure. 0.71ml of Vigamox was placed in the anterior chamber. The wounds were found to be water tight. The eye was dressed with Vigamox. The patient was given protective glasses to wear throughout the day and a shield with  which to sleep tonight. The patient was also given drops with which to begin a drop regimen today and will follow-up with me in one day.  Implant Name Type Inv. Item Serial No. Manufacturer Lot No. LRB No. Used  LENS IOL DIOP 16.5 - [id number] Intraocular Lens LENS IOL DIOP 16.5 [id number]   Right 1   Procedure(s) with comments: CATARACT EXTRACTION PHACO AND INTRAOCULAR LENS PLACEMENT (IOC) (Right) - [place] 00:38 AP% 18.3 CDE 7.06 Fluid pack lot # [id number]  Electronically signed: [place] 07/10/2016 10:11 AM

## 2016-07-10 NOTE — Discharge Instructions (Signed)
Eye Surgery Discharge Instructions  Expect mild scratchy sensation or mild soreness. DO NOT RUB YOUR EYE!  The day of surgery:  Minimal physical activity, but bed rest is not required  No reading, computer work, or close hand work  No bending, lifting, or straining.  May watch TV  For 24 hours:  No driving, legal decisions, or alcoholic beverages  Safety precautions  Eat anything you prefer: It is better to start with liquids, then soup then solid foods.  _____ Eye patch should be worn until postoperative exam tomorrow.  ____ Solar shield eyeglasses should be worn for comfort in the sunlight/patch while sleeping  Resume all regular medications including aspirin or Coumadin if these were discontinued prior to surgery. You may shower, bathe, shave, or wash your hair. Tylenol may be taken for mild discomfort.  Call your doctor if you experience significant pain, nausea, or vomiting, fever > 101 or other signs of infection. [phone] or [phone] Specific instructions:  Follow-up Information    Birder Robson, MD Follow up.   Specialty:  Ophthalmology Why:  May 30 at 10:35am Contact information: [address] [place] [address] [phone]

## 2016-07-10 NOTE — Anesthesia Preprocedure Evaluation (Signed)
Anesthesia Evaluation  Patient identified by MRN, date of birth, ID band Patient awake    Reviewed: Allergy & Precautions, NPO status , Patient's Chart, lab work & pertinent test results  Airway Mallampati: III  TM Distance: <3 FB Neck ROM: Limited    Dental  (+) Upper Dentures, Partial Lower   Pulmonary neg pulmonary ROS,    Pulmonary exam normal breath sounds clear to auscultation       Cardiovascular negative cardio ROS Normal cardiovascular exam     Neuro/Psych Anxiety negative neurological ROS     GI/Hepatic negative GI ROS, Neg liver ROS,   Endo/Other  negative endocrine ROS  Renal/GU negative Renal ROS  negative genitourinary   Musculoskeletal  (+) Arthritis , Osteoarthritis,    Abdominal Normal abdominal exam  (+)   Peds negative pediatric ROS (+)  Hematology negative hematology ROS (+)   Anesthesia Other Findings Past Medical History: No date: Allergy No date: Anxiety No date: Arthritis No date: Blood transfusion without reported diagnosis     Comment: as baby No date: Breast cancer ([place])     Comment: s/p lumpectomy and radiation, no chemo-right No date: History of chicken pox No date: Hyperlipidemia No date: Osteopenia No date: UTI (urinary tract infection)   Reproductive/Obstetrics                             Anesthesia Physical  Anesthesia Plan  ASA: III  Anesthesia Plan: MAC   Post-op Pain Management:    Induction: Intravenous  Airway Management Planned: Nasal Cannula  Additional Equipment:   Intra-op Plan:   Post-operative Plan:   Informed Consent: I have reviewed the patients History and Physical, chart, labs and discussed the procedure including the risks, benefits and alternatives for the proposed anesthesia with the patient or authorized representative who has indicated his/her understanding and acceptance.   Dental advisory given  Plan Discussed  with: CRNA and Surgeon  Anesthesia Plan Comments:         Anesthesia Quick Evaluation

## 2016-07-10 NOTE — H&P (Signed)
All labs reviewed. Abnormal studies sent to patients PCP when indicated.  Previous H&P reviewed, patient examined, there are NO CHANGES.  Aina Rossbach LOUIS5/29/20189:49 AM

## 2016-11-07 DIAGNOSIS — Z1231 Encounter for screening mammogram for malignant neoplasm of breast: Secondary | ICD-10-CM | POA: Diagnosis not present

## 2016-11-07 DIAGNOSIS — Z853 Personal history of malignant neoplasm of breast: Secondary | ICD-10-CM | POA: Diagnosis not present

## 2016-11-07 LAB — HM MAMMOGRAPHY

## 2016-11-08 DIAGNOSIS — Z23 Encounter for immunization: Secondary | ICD-10-CM | POA: Diagnosis not present

## 2016-11-13 ENCOUNTER — Encounter: Payer: Self-pay | Admitting: Family Medicine

## 2016-12-04 DIAGNOSIS — Z23 Encounter for immunization: Secondary | ICD-10-CM | POA: Diagnosis not present

## 2017-02-06 ENCOUNTER — Ambulatory Visit (INDEPENDENT_AMBULATORY_CARE_PROVIDER_SITE_OTHER): Payer: Medicare Other | Admitting: Family Medicine

## 2017-02-06 ENCOUNTER — Encounter: Payer: Self-pay | Admitting: *Deleted

## 2017-02-06 ENCOUNTER — Encounter: Payer: Self-pay | Admitting: Family Medicine

## 2017-02-06 ENCOUNTER — Other Ambulatory Visit: Payer: Self-pay

## 2017-02-06 VITALS — BP 138/90 | HR 81 | Temp 98.5°F | Ht 61.5 in | Wt 166.0 lb

## 2017-02-06 DIAGNOSIS — W19XXXA Unspecified fall, initial encounter: Secondary | ICD-10-CM | POA: Diagnosis not present

## 2017-02-06 DIAGNOSIS — S060X0A Concussion without loss of consciousness, initial encounter: Secondary | ICD-10-CM | POA: Diagnosis not present

## 2017-02-06 NOTE — Progress Notes (Signed)
Dr. Frederico Hamman T. Osmar Howton, MD, [facility] [place], [address] Phone: [phone] Fax: [phone]  02/06/2017  Patient: [name] Price, MRN: [id number], DOB: Mar 23, 1940, 76 y.o.  Primary Physician:  Tonia Ghent, MD   Chief Complaint  Patient presents with  . Fall    Christmas Eve  . Headache  . Black Eye   Subjective:   [name] Price is a 76 y.o. very pleasant female patient who presents with the following:  Date of injury is February 04, 2017.  The patient reports that she fell after tripping on some papers and hit an iron table leg and then her face hit the cement.  She has pain and swelling as well as bruising fairly significantly on the right side of her face.  Since that time the patient has had a headache.  Initially she did feel somewhat dazed.  She does not have a history of having had any prior concussions, no history of headache or any psychiatric history.  She currently does have some headache and has had some problems seeing on the right side, she thinks due to swelling around the eye.  She has had some tingling also, but this has improved.  She did have one instance of mental fogginess yesterday, but otherwise is not having any cognitive symptoms.  She denies emotional symptoms.  She is sleeping slightly less than usual, but she is falling asleep normally.  Past Medical History, Surgical History, Social History, Family History, Problem List, Medications, and Allergies have been reviewed and updated if relevant.  Patient Active Problem List   Diagnosis Date Noted  . Traumatic hematoma of ankle 06/21/2016  . BPV (benign positional vertigo) 03/15/2015  . Ganglion cyst of wrist 02/23/2015  . Extensor tendon rupture of hand 02/01/2015  . Left wrist effusion 01/26/2015  . Second degree burn 03/04/2013  . URI (upper respiratory infection) 01/27/2013  . NEOPLASM, MALIGNANT, BREAST, STAGE I RIGHT  12/05/2007  . KNEE PAIN, RIGHT 11/13/2007  . CHONDROMALACIA PATELLA, LEFT 03/19/2007  . OSTEOPENIA 03/19/2007  . HEMATURIA, HX OF 03/19/2007  . DIVERTICULOSIS, COLON 08/10/2002  . HYPERCHOLESTEROLEMIA 06/05/1996    Past Medical History:  Diagnosis Date  . Allergy   . Anxiety   . Arthritis   . Blood transfusion without reported diagnosis    as baby  . Breast cancer ([place])    s/p lumpectomy and radiation, no chemo-right  . History of chicken pox   . Hyperlipidemia   . Osteopenia   . UTI (urinary tract infection)     Past Surgical History:  Procedure Laterality Date  . BREAST SURGERY Right   . CATARACT EXTRACTION W/PHACO Left 12/28/2014   Procedure: CATARACT EXTRACTION PHACO AND INTRAOCULAR LENS PLACEMENT (IOC);  Surgeon: Birder Robson, MD;  Location: [facility] ORS;  Service: Ophthalmology;  Laterality: Left;  [place] 00:38 AP% 19.9 CDE 7.70 fluid pack lot # [id number]  . CATARACT EXTRACTION W/PHACO Right 07/10/2016   Procedure: CATARACT EXTRACTION PHACO AND INTRAOCULAR LENS PLACEMENT (IOC);  Surgeon: Birder Robson, MD;  Location: [facility] ORS;  Service: Ophthalmology;  Laterality: Right;  [place] 00:38 AP% 18.3 CDE 7.06 Fluid pack lot # [id number]  . COLONOSCOPY    . EYE SURGERY    . JOINT REPLACEMENT Bilateral    hip  / shoulder  . OOPHORECTOMY Left   . POLYPECTOMY    . TOTAL HIP ARTHROPLASTY Bilateral    (2) Two  . TOTAL SHOULDER REPLACEMENT  Social History   Socioeconomic History  . Marital status: Married    Spouse name: Not on file  . Number of children: Not on file  . Years of education: Not on file  . Highest education level: Not on file  Social Needs  . Financial resource strain: Not on file  . Food insecurity - worry: Not on file  . Food insecurity - inability: Not on file  . Transportation needs - medical: Not on file  . Transportation needs - non-medical: Not on file  Occupational History  . Not on file  Tobacco Use  . Smoking status: Never Smoker  .  Smokeless tobacco: Never Used  Substance and Sexual Activity  . Alcohol use: No  . Drug use: No  . Sexual activity: No  Other Topics Concern  . Not on file  Social History Narrative   Married 1963   Likes to travel   2 daughters    Family History  Problem Relation Age of Onset  . Lung cancer Mother   . Heart disease Mother   . Colon cancer Mother   . Cancer Father   . Colon cancer Father   . Breast cancer Maternal Aunt   . Breast cancer Maternal Aunt   . Breast cancer Maternal Aunt   . Rectal cancer Neg Hx   . Stomach cancer Neg Hx     Allergies  Allergen Reactions  . Codeine Nausea And Vomiting    Medication list reviewed and updated in full in [place].   GEN: No acute illnesses, no fevers, chills. GI: No n/v/d, eating normally Pulm: No SOB Interactive and getting along well at home.  Otherwise, ROS is as per the HPI.  Objective:   BP 138/90   Pulse 81   Temp 98.5 F (36.9 C) (Oral)   Ht 5' 1.5" (1.562 m)   Wt 166 lb (75.3 kg)   BMI 30.86 kg/m   GEN: WDWN, NAD, Non-toxic, A & O x 3 HEENT: Atraumatic, Normocephalic. Neck supple. No masses, No LAD. Ears and Nose: No external deformity. CV: RRR, No M/G/R. No JVD. No thrill. No extra heart sounds. PULM: CTA B, no wheezes, crackles, rhonchi. No retractions. No resp. distress. No accessory muscle use. EXTR: No c/c/e NEURO Normal gait.  PSYCH: Normally interactive. Conversant. Not depressed or anxious appearing.  Calm demeanor.   Neuro: CN 2-12 grossly intact. PERRLA. EOMI. Sensation intact throughout. Str 5/5 all extremities. DTR 2+. No clonus. A and o x 4. Romberg neg. Finger nose neg. Heel -shin neg.    Laboratory and Imaging Data:  Assessment and Plan:   Concussion without loss of consciousness, initial encounter  Fall, initial encounter  Clinically, the patient does have a concussion, but she is not particularly symptomatic relatively.  Recommended cognitive and physical rest for the  next week, and to refrain from driving until at least next Monday.  Particularly, stay away from computers and tablets.  She understands, she plans on taking it pretty easy over the next few days.  Follow-up: prn   Patient Instructions  HEAD INJURY / CONCUSSSION:   MOST PEOPLE RECOVER FINE AND COMPLETELY FROM A CONCUSSION, BUT THE MOST IMPORTANT THING IS VERY EARLY COMPLETE REST SO THAT THE BRAN CAN RECOVER.  COMPLETE PHYSICAL AND MENTAL REST IS NEEDED.  THAT MEANS: NO SCHOOL OR WORK UNTIL YOU ARE BETTER NO PHYSICAL EXERTION AT ALL UNTIL YOU HAVE NO SYMPTOMS NO MENTAL EXERTION, MEANING NO WORK, NO HOMEWORK, NO TEST TAKING.  NO DRIVING UNTIL YOU ARE ASYMPTOMATIC.  NO VIDEO GAMES, NO USING THE COMPUTER, NO TEXTING, NO USING SMARTPHONES, NO USE OF AN IPAD OR TABLET. DO NOT GO TO A MOVIE THEATRE OR WATCH SPORTS ON TV. HDTV TENDS TO MAKE PEOPLE FEEL WORSE.   IN OTHER WORDS, DO NOT DO ANYTHING. SIT AND CALMLY REST UNTIL YOU FEEL BETTER. SLEEP IS OK. YOU CAN HANG OUT AND TALK TO A FRIEND.  IT IS DIFFICULT TO KNOW HOW QUICKLY YOU WILL RECOVER. SOME PEOPLE FEEL BETTER IN A FEW DAYS, WHILE OTHER PEOPLE HAVE SYMPTOMS THAT CAN LAST FOR WEEKS TO MONTHS.  EARLY REST IS BY FAR THE MOST IMPORTANT THING.  If any of the following occur notify your physician or go to the Hospital Emergency Department - if markedly worsening:  . Increased drowsiness, stupor or loss of consciousness . Restlessness or convulsions (fits) . Paralysis in arms or legs . Temperature above 100 F . Vomiting . Severe headache . Blood or clear fluid dripping from the nose or ears . Stiffness of the neck . Dizziness or blurred vision . Pulsating pain in the eye . Unequal pupils of eye . Personality changes . Any other unusual symptoms  PRECAUTIONS . Keep head elevated at all times for the first 24 hours (Elevate mattress if pillow is ineffective) . Do not take tranquilizers, sedatives, narcotics or alcohol . Avoid  aspirin. Use only acetaminophen (e.g. Tylenol) or ibuprofen (e.g. Advil) for relief of pain. Follow directions on the bottle for dosage. . Use ice packs for comfort  MEDICATIONS Use medications only as directed by your physician  Concussion Direct trauma to the head often causes a condition known as a concussion. This injury will interfere with brain function and may cause you to lose consciousness. The consequences of a concussion are usually temporary, but repetitive concussions can be very dangerous. If you have multiple concussions, you will have a greater risk of long-term effects, such as slurred speech, slow movements, impaired thinking, or tremors. The severity of a concussion is based on the length and severity of the interference with brain activity.  SYMPTOMS  Symptoms of a concussion vary depending on the severity of the injury. Very mild concussions may even occur without any noticeable symptoms. Swelling in the area of the injury is not related to the seriousness of the injury.   CAUSES  A concussion is the result of trauma to the head. When the head is subjected to such an injury, the brain strikes against the inner wall of the skull. This impact is what causes the damage to the brain. The force of injury is related to severity of injury. The most severe concussions are associated with incidents that involve large impact forces such as motor vehicle accidents. Wearing a helmet will reduce the severity of trauma to the head, but concussions may still occur if you are wearing a helmet.  RISK INCREASES WITH:  Contact sports (football, hockey, rugby, or lacrosse).  Fighting sports (martial arts or boxing).  Riding bicycles, motorcycles, or horses (when you ride without a helmet).   PREVENTION  Wear proper protective headgear and ensure correct fit.  Wear seat belts when driving and riding in a car.  Do not drink or use mind-altering drugs and drive.   PROGNOSIS    Concussions are typically curable if they are recognized and treated early. If a severe concussion or multiple concussions go untreated, then the complications may be life-threatening or cause permanent disability and brain damage.  RELATED COMPLICATIONS   Permanent brain damage (slurred speech, slow movement, impaired thinking, or tremors).  Bleeding under the skull (subdural hemorrhage or hematoma, epidural hematoma).  Bleeding into the brain.  Prolonged healing time if usual activities are resumed too soon.  Infection if skin over the concussion site is broken.  Increased risk of future concussions (less trauma is required for a second concussion than the first).      Signed,  Maud Deed. Genoveva Singleton, MD   Allergies as of 02/06/2017      Reactions   Codeine Nausea And Vomiting      Medication List        Accurate as of 02/06/17 12:02 PM. Always use your most recent med list.          ALEVE 220 MG tablet Generic drug:  naproxen sodium Take 220 mg by mouth 2 (two) times daily as needed.   calcium carbonate 600 MG Tabs tablet Commonly known as:  OS-CAL Take 600 mg by mouth 2 (two) times daily with a meal.   CENTRUM SILVER ULTRA WOMENS PO Take 1 tablet by mouth daily.   CLA 1000 MG Caps Take 1 capsule by mouth daily.   PARoxetine 25 MG 24 hr tablet Commonly known as:  PAXIL-CR Take 25 mg by mouth daily.   vitamin B-12 1000 MCG tablet Commonly known as:  CYANOCOBALAMIN Take 1,000 mcg by mouth daily.   Vitamin D 2000 units tablet Take 2,000 Units by mouth daily.   vitamin E 400 UNIT capsule Take 400 Units by mouth daily.

## 2017-02-06 NOTE — Patient Instructions (Signed)

## 2017-02-22 ENCOUNTER — Telehealth: Payer: Self-pay

## 2017-02-22 MED ORDER — BENZONATATE 200 MG PO CAPS: 200.0000 mg | ORAL_CAPSULE | Freq: Three times a day (TID) | ORAL | 0 refills | Status: DC | PRN

## 2017-02-22 NOTE — Telephone Encounter (Signed)
Patient advised.

## 2017-02-22 NOTE — Telephone Encounter (Signed)
Use tessalon.  If more SOB or not better or fever, then needs eval.  rx sent.  Thanks.

## 2017-02-22 NOTE — Telephone Encounter (Signed)
Copied from [place] [id number]. Topic: Inquiry >> Feb 22, 2017  3:10 PM [name] Price, NT wrote: Reason for CRM: patient says she has had a cough since christmas and sometimes has wheezing, patient would like to know if she can get anything for the cough sent into the [facility] pharmacy on file in [place]. Call back if needed

## 2017-02-25 ENCOUNTER — Ambulatory Visit (INDEPENDENT_AMBULATORY_CARE_PROVIDER_SITE_OTHER): Payer: Medicare Other | Admitting: Family Medicine

## 2017-02-25 ENCOUNTER — Encounter: Payer: Self-pay | Admitting: Family Medicine

## 2017-02-25 VITALS — BP 120/86 | HR 101 | Temp 98.6°F | Wt 164.0 lb

## 2017-02-25 DIAGNOSIS — R05 Cough: Secondary | ICD-10-CM | POA: Diagnosis not present

## 2017-02-25 DIAGNOSIS — R059 Cough, unspecified: Secondary | ICD-10-CM

## 2017-02-25 MED ORDER — DOXYCYCLINE HYCLATE 100 MG PO TABS: 100.0000 mg | ORAL_TABLET | Freq: Two times a day (BID) | ORAL | 0 refills | Status: DC

## 2017-02-25 MED ORDER — BENZONATATE 200 MG PO CAPS: 200.0000 mg | ORAL_CAPSULE | Freq: Three times a day (TID) | ORAL | 0 refills | Status: DC | PRN

## 2017-02-25 NOTE — Progress Notes (Signed)
Her facial soreness is better now.  No residual concussion sx.    Her husband was sick for about 2 weeks, he is better now.  She has been coughing for about 2-3 weeks.  Cough, runny nose.  Fatigued.  Some wheeze, but that is getting better.  The other sx are better but the cough is only slowly getting better.  Cough is worse at night.  Some greenish sputum, in the day and in the night.  No vomiting.  No rash.  No facial pain except for the prev soreness from the fall.  Tessalon helps a little with the cough.    Meds, vitals, and allergies reviewed.   ROS: Per HPI unless specifically indicated in ROS section   GEN: nad, alert and oriented HEENT: mucous membranes moist, tm w/o erythema, nasal exam w/o erythema, clear discharge noted,  OP with mild cobblestoning NECK: supple w/o LA CV: rrr.   PULM: Diffuse rhonchi with cough noted.  No focal dec in BS. EXT: no edema

## 2017-02-25 NOTE — Patient Instructions (Signed)
Likely bronchitis.   Start doxycycline.  Use tessalon if needed for the cough.  Rest and fluids.  Update [place] as needed.  Take care.  Glad to see you.

## 2017-02-26 DIAGNOSIS — R059 Cough, unspecified: Secondary | ICD-10-CM | POA: Insufficient documentation

## 2017-02-26 DIAGNOSIS — R05 Cough: Secondary | ICD-10-CM | POA: Insufficient documentation

## 2017-02-26 DIAGNOSIS — R051 Acute cough: Secondary | ICD-10-CM | POA: Insufficient documentation

## 2017-02-26 NOTE — Assessment & Plan Note (Signed)
Likely bronchitis.   Start doxycycline.  Use tessalon if needed for the cough.  Rest and fluids.  Update [place] as needed.  Okay for outpatient follow-up.  She agrees with plan.

## 2017-03-05 DIAGNOSIS — Z01419 Encounter for gynecological examination (general) (routine) without abnormal findings: Secondary | ICD-10-CM | POA: Diagnosis not present

## 2017-03-05 DIAGNOSIS — L904 Acrodermatitis chronica atrophicans: Secondary | ICD-10-CM | POA: Diagnosis not present

## 2017-03-05 DIAGNOSIS — Z6831 Body mass index (BMI) 31.0-31.9, adult: Secondary | ICD-10-CM | POA: Diagnosis not present

## 2017-04-16 DIAGNOSIS — Z809 Family history of malignant neoplasm, unspecified: Secondary | ICD-10-CM | POA: Diagnosis not present

## 2017-04-16 DIAGNOSIS — N904 Leukoplakia of vulva: Secondary | ICD-10-CM | POA: Diagnosis not present

## 2017-04-16 DIAGNOSIS — N9089 Other specified noninflammatory disorders of vulva and perineum: Secondary | ICD-10-CM | POA: Diagnosis not present

## 2017-04-16 DIAGNOSIS — L904 Acrodermatitis chronica atrophicans: Secondary | ICD-10-CM | POA: Diagnosis not present

## 2017-04-20 ENCOUNTER — Encounter: Payer: Self-pay | Admitting: Internal Medicine

## 2017-04-25 ENCOUNTER — Encounter: Payer: Self-pay | Admitting: Internal Medicine

## 2017-06-17 ENCOUNTER — Ambulatory Visit: Payer: Medicare Other

## 2017-06-17 ENCOUNTER — Ambulatory Visit (INDEPENDENT_AMBULATORY_CARE_PROVIDER_SITE_OTHER): Payer: Medicare Other

## 2017-06-17 VITALS — BP 110/72 | HR 74 | Temp 98.4°F | Ht 61.75 in | Wt 149.8 lb

## 2017-06-17 DIAGNOSIS — Z0000 Encounter for general adult medical examination without abnormal findings: Secondary | ICD-10-CM | POA: Diagnosis not present

## 2017-06-17 DIAGNOSIS — E7800 Pure hypercholesterolemia, unspecified: Secondary | ICD-10-CM

## 2017-06-17 DIAGNOSIS — E559 Vitamin D deficiency, unspecified: Secondary | ICD-10-CM

## 2017-06-17 LAB — LIPID PANEL
CHOLESTEROL: 260 mg/dL — AB (ref 0–200)
HDL: 52.4 mg/dL (ref >39.00)
LDL CALC: 193 mg/dL — AB (ref 0–99)
NONHDL: 207.56
Total CHOL/HDL Ratio: 5
Triglycerides: 74 mg/dL (ref 0.0–149.0)
VLDL: 14.8 mg/dL (ref 0.0–40.0)

## 2017-06-17 LAB — COMPREHENSIVE METABOLIC PANEL
ALBUMIN: 4 g/dL (ref 3.5–5.2)
ALK PHOS: 53 U/L (ref 39–117)
ALT: 12 U/L (ref 0–35)
AST: 24 U/L (ref 0–37)
BUN: 12 mg/dL (ref 6–23)
CO2: 29 mEq/L (ref 19–32)
Calcium: 9.9 mg/dL (ref 8.4–10.5)
Chloride: 103 mEq/L (ref 96–112)
Creatinine, Ser: 0.53 mg/dL (ref 0.40–1.20)
GFR: 119.71 mL/min (ref >60.00)
GLUCOSE: 89 mg/dL (ref 70–99)
POTASSIUM: 5 meq/L (ref 3.5–5.1)
SODIUM: 141 meq/L (ref 135–145)
Total Bilirubin: 0.8 mg/dL (ref 0.2–1.2)
Total Protein: 6.7 g/dL (ref 6.0–8.3)

## 2017-06-17 LAB — CBC WITH DIFFERENTIAL/PLATELET
Basophils Absolute: 0 10*3/uL (ref 0.0–0.1)
Basophils Relative: 1.3 % (ref 0.0–3.0)
EOS PCT: 2.6 % (ref 0.0–5.0)
Eosinophils Absolute: 0.1 10*3/uL (ref 0.0–0.7)
HCT: 45.6 % (ref 36.0–46.0)
Hemoglobin: 15.2 g/dL — ABNORMAL HIGH (ref 12.0–15.0)
LYMPHS ABS: 1.1 10*3/uL (ref 0.7–4.0)
Lymphocytes Relative: 28.4 % (ref 12.0–46.0)
MCHC: 33.3 g/dL (ref 30.0–36.0)
MCV: 91.8 fl (ref 78.0–100.0)
MONO ABS: 0.4 10*3/uL (ref 0.1–1.0)
Monocytes Relative: 10.5 % (ref 3.0–12.0)
NEUTROS PCT: 57.2 % (ref 43.0–77.0)
Neutro Abs: 2.2 10*3/uL (ref 1.4–7.7)
Platelets: 196 10*3/uL (ref 150.0–400.0)
RBC: 4.97 Mil/uL (ref 3.87–5.11)
RDW: 14.3 % (ref 11.5–15.5)
WBC: 3.8 10*3/uL — AB (ref 4.0–10.5)

## 2017-06-17 LAB — VITAMIN D 25 HYDROXY (VIT D DEFICIENCY, FRACTURES): VITD: 56.14 ng/mL (ref 30.00–100.00)

## 2017-06-17 NOTE — Patient Instructions (Signed)
[name] Price , Thank you for taking time to come for your Medicare Wellness Visit. I appreciate your ongoing commitment to your health goals. Please review the following plan we discussed and let me know if I can assist you in the future.   These are the goals we discussed: Goals    . Patient Stated     Starting 06/17/2017, I will continue to take Keto pills daily in an effort to maintain weight of 146 lbs.        This is a list of the screening recommended for you and due dates:  Health Maintenance  Topic Date Due  . Colon Cancer Screening  07/12/2017*  . Flu Shot  09/12/2017  . Mammogram  11/07/2017  . Tetanus Vaccine  03/13/2025  . DEXA scan (bone density measurement)  Completed  . Pneumonia vaccines  Completed  *Topic was postponed. The date shown is not the original due date.   Preventive Care for Adults  A healthy lifestyle and preventive care can promote health and wellness. Preventive health guidelines for adults include the following key practices.  . A routine yearly physical is a good way to check with your health care provider about your health and preventive screening. It is a chance to share any concerns and updates on your health and to receive a thorough exam.  . Visit your dentist for a routine exam and preventive care every 6 months. Brush your teeth twice a day and floss once a day. Good oral hygiene prevents tooth decay and gum disease.  . The frequency of eye exams is based on your age, health, family medical history, use  of contact lenses, and other factors. Follow your health care provider's recommendations for frequency of eye exams.  . Eat a healthy diet. Foods like vegetables, fruits, whole grains, low-fat dairy products, and lean protein foods contain the nutrients you need without too many calories. Decrease your intake of foods high in solid fats, added sugars, and salt. Eat the right amount of calories for you. Get information about a proper diet from  your health care provider, if necessary.  . Regular physical exercise is one of the most important things you can do for your health. Most adults should get at least 150 minutes of moderate-intensity exercise (any activity that increases your heart rate and causes you to sweat) each week. In addition, most adults need muscle-strengthening exercises on 2 or more days a week.  Silver Sneakers may be a benefit available to you. To determine eligibility, you may visit the website: www.silversneakers.com or contact program at 715-750-1434 Mon-Fri between 8AM-8PM.   . Maintain a healthy weight. The body mass index (BMI) is a screening tool to identify possible weight problems. It provides an estimate of body fat based on height and weight. Your health care provider can find your BMI and can help you achieve or maintain a healthy weight.   For adults 20 years and older: ? A BMI below 18.5 is considered underweight. ? A BMI of 18.5 to 24.9 is normal. ? A BMI of 25 to 29.9 is considered overweight. ? A BMI of 30 and above is considered obese.   . Maintain normal blood lipids and cholesterol levels by exercising and minimizing your intake of saturated fat. Eat a balanced diet with plenty of fruit and vegetables. Blood tests for lipids and cholesterol should begin at age 53 and be repeated every 5 years. If your lipid or cholesterol levels are high, you are over  50, or you are at high risk for heart disease, you may need your cholesterol levels checked more frequently. Ongoing high lipid and cholesterol levels should be treated with medicines if diet and exercise are not working.  . If you smoke, find out from your health care provider how to quit. If you do not use tobacco, please do not start.  . If you choose to drink alcohol, please do not consume more than 2 drinks per day. One drink is considered to be 12 ounces (355 mL) of beer, 5 ounces (148 mL) of wine, or 1.5 ounces (44 mL) of liquor.  . If you  are 39-69 years old, ask your health care provider if you should take aspirin to prevent strokes.  . Use sunscreen. Apply sunscreen liberally and repeatedly throughout the day. You should seek shade when your shadow is shorter than you. Protect yourself by wearing long sleeves, pants, a wide-brimmed hat, and sunglasses year round, whenever you are outdoors.  . Once a month, do a whole body skin exam, using a mirror to look at the skin on your back. Tell your health care provider of new moles, moles that have irregular borders, moles that are larger than a pencil eraser, or moles that have changed in shape or color.

## 2017-06-17 NOTE — Progress Notes (Signed)
PCP notes:   Health maintenance:  Colonoscopy - future appt scheduled  Abnormal screenings:   Hearing - failed  Hearing Screening   125Hz  250Hz  500Hz  1000Hz  2000Hz  3000Hz  4000Hz  6000Hz  8000Hz   Right ear:   0 40 40  0    Left ear:   0 40 40  0    Vision Screening Comments: Last vision exam in 2018 with Dr. George Ina  Patient concerns:   None  Nurse concerns:  None  Next PCP appt:   To be determined  I reviewed health advisor's note, was available for consultation on the day of service listed in this note, and agree with documentation and plan. Elsie Stain, MD.

## 2017-06-17 NOTE — Progress Notes (Signed)
Subjective:   [name] Price is a 77 y.o. female who presents for Medicare Annual (Subsequent) preventive examination.  Review of Systems:  N/A Cardiac Risk Factors include: advanced age (>86men, >70 women);dyslipidemia     Objective:     Vitals: BP 110/72 (BP Location: Right Arm, Patient Position: Sitting, Cuff Size: Normal)   Pulse 74   Temp 98.4 F (36.9 C) (Oral)   Ht 5' 1.75" (1.568 m) Comment: no shoes  Wt 149 lb 12 oz (67.9 kg)   SpO2 95%   BMI 27.61 kg/m   Body mass index is 27.61 kg/m.  Advanced Directives 06/17/2017 07/10/2016 06/15/2016 05/30/2015 03/26/2014  Does Patient Have a Medical Advance Directive? Yes No Yes Yes Yes  Type of Paramedic of [place];Living will - Healthcare Power of [place]  Does patient want to make changes to medical advance directive? - - - No - Patient declined -  Copy of [place] in Chart? No - copy requested - No - copy requested No - copy requested -  Would patient like information on creating a medical advance directive? - No - Patient declined - - -    Tobacco Social History   Tobacco Use  Smoking Status Never Smoker  Smokeless Tobacco Never Used     Counseling given: No   Clinical Intake:  Pre-visit preparation completed: Yes  Pain : No/denies pain Pain Score: 0-No pain     Nutritional Status: BMI 25 -29 Overweight Nutritional Risks: None Diabetes: No  How often do you need to have someone help you when you read instructions, pamphlets, or other written materials from your doctor or pharmacy?: 1 - Never What is the last grade level you completed in school?: 12th grade  Interpreter Needed?: No  Comments: pt lives with spouse Information entered by :: LPinson, LPN  Past Medical History:  Diagnosis Date  . Allergy   . Anxiety   . Arthritis   . Blood transfusion without reported diagnosis    as baby  . Breast  cancer ([place])    s/p lumpectomy and radiation, no chemo-right  . History of chicken pox   . Hyperlipidemia   . Osteopenia   . UTI (urinary tract infection)    Past Surgical History:  Procedure Laterality Date  . BREAST SURGERY Right   . CATARACT EXTRACTION W/PHACO Left 12/28/2014   Procedure: CATARACT EXTRACTION PHACO AND INTRAOCULAR LENS PLACEMENT (IOC);  Surgeon: Birder Robson, MD;  Location: [facility] ORS;  Service: Ophthalmology;  Laterality: Left;  [place] 00:38 AP% 19.9 CDE 7.70 fluid pack lot # [id number]  . CATARACT EXTRACTION W/PHACO Right 07/10/2016   Procedure: CATARACT EXTRACTION PHACO AND INTRAOCULAR LENS PLACEMENT (IOC);  Surgeon: Birder Robson, MD;  Location: [facility] ORS;  Service: Ophthalmology;  Laterality: Right;  [place] 00:38 AP% 18.3 CDE 7.06 Fluid pack lot # [id number]  . COLONOSCOPY    . EYE SURGERY    . JOINT REPLACEMENT Bilateral    hip  / shoulder  . OOPHORECTOMY Left   . POLYPECTOMY    . TOTAL HIP ARTHROPLASTY Bilateral    (2) Two  . TOTAL SHOULDER REPLACEMENT     Family History  Problem Relation Age of Onset  . Lung cancer Mother   . Heart disease Mother   . Colon cancer Mother   . Cancer Father   . Colon cancer Father   . Breast cancer Maternal Aunt   . Breast cancer Maternal  Aunt   . Breast cancer Maternal Aunt   . Rectal cancer Neg Hx   . Stomach cancer Neg Hx    Social History   Socioeconomic History  . Marital status: Married    Spouse name: Not on file  . Number of children: Not on file  . Years of education: Not on file  . Highest education level: Not on file  Occupational History  . Not on file  Social Needs  . Financial resource strain: Not on file  . Food insecurity:    Worry: Not on file    Inability: Not on file  . Transportation needs:    Medical: Not on file    Non-medical: Not on file  Tobacco Use  . Smoking status: Never Smoker  . Smokeless tobacco: Never Used  Substance and Sexual Activity  . Alcohol use: No    Comment:  rare once a year  . Drug use: No  . Sexual activity: Not Currently  Lifestyle  . Physical activity:    Days per week: Not on file    Minutes per session: Not on file  . Stress: Not on file  Relationships  . Social connections:    Talks on phone: Not on file    Gets together: Not on file    Attends religious service: Not on file    Active member of club or organization: Not on file    Attends meetings of clubs or organizations: Not on file    Relationship status: Not on file  Other Topics Concern  . Not on file  Social History Narrative   Married 1963   Likes to travel   2 daughters    Outpatient Encounter Medications as of 06/17/2017  Medication Sig  . naproxen sodium (ALEVE) 220 MG tablet Take 220 mg by mouth 2 (two) times daily as needed.  Marland Kitchen PARoxetine (PAXIL-CR) 25 MG 24 hr tablet Take 25 mg by mouth daily.  Marland Kitchen UNABLE TO FIND Take 2 capsules by mouth daily. Med Name: Keto Pills  . calcium carbonate (OS-CAL) 600 MG TABS Take 600 mg by mouth 2 (two) times daily with a meal.  . Cholecalciferol (VITAMIN D) 2000 units tablet Take 2,000 Units by mouth daily.  . Multiple Vitamins-Minerals (CENTRUM SILVER ULTRA WOMENS PO) Take 1 tablet by mouth daily.  . Safflower Oil (CLA) 1000 MG CAPS Take 1 capsule by mouth daily.  . vitamin B-12 (CYANOCOBALAMIN) 1000 MCG tablet Take 1,000 mcg by mouth daily.  . vitamin E 400 UNIT capsule Take 400 Units by mouth daily.  . [DISCONTINUED] benzonatate (TESSALON) 200 MG capsule Take 1 capsule (200 mg total) by mouth 3 (three) times daily as needed. (Patient not taking: Reported on 06/17/2017)  . [DISCONTINUED] doxycycline (VIBRA-TABS) 100 MG tablet Take 1 tablet (100 mg total) by mouth 2 (two) times daily. (Patient not taking: Reported on 06/17/2017)   No facility-administered encounter medications on file as of 06/17/2017.     Activities of Daily Living In your present state of health, do you have any difficulty performing the following activities:  06/17/2017  Hearing? N  Vision? N  Difficulty concentrating or making decisions? N  Walking or climbing stairs? N  Dressing or bathing? N  Doing errands, shopping? N  Preparing Food and eating ? N  Using the Toilet? N  In the past six months, have you accidently leaked urine? N  Do you have problems with loss of bowel control? N  Managing your Medications? N  Managing  your Finances? N  Housekeeping or managing your Housekeeping? N  Some recent data might be hidden    Patient Care Team: Tonia Ghent, MD as PCP - General (Family Medicine)    Assessment:   This is a routine wellness examination for [place].   Hearing Screening   125Hz  250Hz  500Hz  1000Hz  2000Hz  3000Hz  4000Hz  6000Hz  8000Hz   Right ear:   0 40 40  0    Left ear:   0 40 40  0    Vision Screening Comments: Last vision exam in 2018 with Dr. George Ina   Exercise Activities and Dietary recommendations Current Exercise Habits: Home exercise routine, Type of exercise: walking, Time (Minutes): 30, Frequency (Times/Week): 7, Weekly Exercise (Minutes/Week): 210, Intensity: Mild, Exercise limited by: None identified  Goals    . Patient Stated     Starting 06/17/2017, I will continue to take Keto pills daily in an effort to maintain weight of 146 lbs.        Fall Risk Fall Risk  06/17/2017 06/15/2016 05/30/2015  Falls in the past year? No No No   Depression Screen PHQ 2/9 Scores 06/17/2017 06/15/2016 05/30/2015  PHQ - 2 Score 0 0 0  PHQ- 9 Score 0 - -     Cognitive Function MMSE - Mini Mental State Exam 06/17/2017 06/15/2016 05/30/2015  Orientation to time 5 5 5   Orientation to Place 5 5 5   Registration 3 3 3   Attention/ Calculation 0 0 0  Recall 3 3 2   Recall-comments - - recalled 2 of 3 words without cue  Language- name 2 objects 0 0 0  Language- repeat 1 1 1   Language- follow 3 step command 3 3 3   Language- read & follow direction 0 0 0  Write a sentence 0 0 0  Copy design 0 0 0  Total score 20 20 19    PLEASE NOTE:  A Mini-Cog screen was completed. Maximum score is 20. A value of 0 denotes this part of [place] MMSE was not completed or the patient failed this part of the Mini-Cog screening.   Mini-Cog Screening Orientation to Time - Max 5 pts Orientation to Place - Max 5 pts Registration - Max 3 pts Recall - Max 3 pts Language Repeat - Max 1 pts Language Follow 3 Step Command - Max 3 pts       Immunization History  Administered Date(s) Administered  . Influenza Split 11/10/2010  . Influenza Whole 11/13/2007  . Influenza, High Dose Seasonal PF 12/27/2015, 11/08/2016  . Influenza-Unspecified 11/12/2013  . Pneumococcal Conjugate-13 05/30/2015, 12/04/2016  . Pneumococcal Polysaccharide-23 03/03/2013  . Td 02/13/1991, 03/14/2015    Screening Tests Health Maintenance  Topic Date Due  . COLONOSCOPY  07/12/2017 (Originally 04/07/2017)  . INFLUENZA VACCINE  09/12/2017  . MAMMOGRAM  11/07/2017  . TETANUS/TDAP  03/13/2025  . DEXA SCAN  Completed  . PNA vac Low Risk Adult  Completed      Plan:     I have personally reviewed, addressed, and noted the following in the patient's chart:  A. Medical and social history B. Use of alcohol, tobacco or illicit drugs  C. Current medications and supplements D. Functional ability and status E.  Nutritional status F.  Physical activity G. Advance directives H. List of other physicians I.  Hospitalizations, surgeries, and ER visits in previous 12 months J.  [place] to include hearing, vision, cognitive, depression L. Referrals and appointments - none  In addition, I have reviewed and discussed with  patient certain preventive protocols, quality metrics, and best practice recommendations. A written personalized care plan for preventive services as well as general preventive health recommendations were provided to patient.  See attached scanned questionnaire for additional information.   Signed,   Lindell Noe, MHA, BS, LPN [facility]

## 2017-06-24 ENCOUNTER — Ambulatory Visit (INDEPENDENT_AMBULATORY_CARE_PROVIDER_SITE_OTHER): Payer: Medicare Other | Admitting: Family Medicine

## 2017-06-24 ENCOUNTER — Encounter: Payer: Self-pay | Admitting: Family Medicine

## 2017-06-24 VITALS — BP 112/76 | HR 76 | Temp 98.7°F | Ht 61.75 in | Wt 149.8 lb

## 2017-06-24 DIAGNOSIS — R232 Flushing: Secondary | ICD-10-CM | POA: Diagnosis not present

## 2017-06-24 DIAGNOSIS — Z7189 Other specified counseling: Secondary | ICD-10-CM

## 2017-06-24 DIAGNOSIS — E2839 Other primary ovarian failure: Secondary | ICD-10-CM

## 2017-06-24 DIAGNOSIS — M8580 Other specified disorders of bone density and structure, unspecified site: Secondary | ICD-10-CM

## 2017-06-24 DIAGNOSIS — E7800 Pure hypercholesterolemia, unspecified: Secondary | ICD-10-CM

## 2017-06-24 DIAGNOSIS — Z0000 Encounter for general adult medical examination without abnormal findings: Secondary | ICD-10-CM

## 2017-06-24 NOTE — Patient Instructions (Addendum)
We will call about your referral.  [name] Price or [name] Price will call about the bone density test.   Use the eat right diet and recheck lipids in about 6 months at a fasting lab visit.  I would cut out the diet pills in the near future.  Take care.  Glad to see you.  Update me as needed.

## 2017-06-24 NOTE — Progress Notes (Signed)
Colonoscopy - future appt scheduled.  D/w pt.   Mammogram 2018 Osteopenia d/w pt.  D/w pt about f/u DXA.  Ordered.    Tetanus 2007 PNA up to date Flu 2018 Shingles shot prev done.  D/w pt.   Living will d/w pt.  Husband designated if patient were incapacitated.   Hearing - failed.  D/w pt.  Declined hearing aids.    HLD.  Intentional weight loss.  She cut out sweets and pasta.  Lipids were up, she had been eating more meats.  This has happened with prev diet changes, when she has used the [employer].  She is exercising, walking daily and gardening.    H/o hot flashes.  On paxil for that, with relief.  No ADE on med.  We talked about considering tapering in the future but not yet.    PMH and SH reviewed  ROS: Per HPI unless specifically indicated in ROS section   Meds, vitals, and allergies reviewed.   GEN: nad, alert and oriented HEENT: mucous membranes moist NECK: supple w/o LA CV: rrr. PULM: ctab, no inc wob ABD: soft, +bs EXT: no edema SKIN: no acute rash

## 2017-06-25 DIAGNOSIS — Z0000 Encounter for general adult medical examination without abnormal findings: Secondary | ICD-10-CM | POA: Insufficient documentation

## 2017-06-25 DIAGNOSIS — Z7189 Other specified counseling: Secondary | ICD-10-CM | POA: Insufficient documentation

## 2017-06-25 DIAGNOSIS — R232 Flushing: Secondary | ICD-10-CM | POA: Insufficient documentation

## 2017-06-25 NOTE — Assessment & Plan Note (Signed)
Living will d/w pt.  Husband designated if patient were incapacitated.  

## 2017-06-25 NOTE — Assessment & Plan Note (Addendum)
Intentional weight loss.  She cut out sweets and pasta.  Lipids were up, she had been eating more meats.  This has happened with prev diet changes, when she has used the [employer].  She is exercising, walking daily and gardening.  We talked about changing over to a relatively low carbohydrate diet with less fatty meats to help with her lipids.  Reasonable to recheck in 6 months.  Order placed.  She agrees.  Handout discussed with patient in detail.>25 minutes spent in face to face time with patient, >50% spent in counselling or coordination of care.

## 2017-06-25 NOTE — Assessment & Plan Note (Signed)
Colonoscopy - future appt scheduled.  D/w pt.   Mammogram 2018 Osteopenia d/w pt.  D/w pt about f/u DXA.   Tetanus 2007 PNA up to date Flu 2018 Shingles shot prev done.  D/w pt.   Living will d/w pt.  Husband designated if patient were incapacitated.   Hearing - failed.  D/w pt.  Declined hearing aids.

## 2017-06-25 NOTE — Assessment & Plan Note (Signed)
On paxil, with relief.  No ADE on med.  We talked about considering tapering in the future but not yet.  Continue as is for now.

## 2017-06-26 ENCOUNTER — Other Ambulatory Visit: Payer: Self-pay

## 2017-06-26 ENCOUNTER — Ambulatory Visit (AMBULATORY_SURGERY_CENTER): Payer: Self-pay | Admitting: *Deleted

## 2017-06-26 VITALS — Ht 62.5 in | Wt 150.0 lb

## 2017-06-26 DIAGNOSIS — Z860100 Personal history of colon polyps, unspecified: Secondary | ICD-10-CM

## 2017-06-26 DIAGNOSIS — Z800 Family history of malignant neoplasm of digestive organs: Secondary | ICD-10-CM

## 2017-06-26 MED ORDER — NA SULFATE-K SULFATE-MG SULF 17.5-3.13-1.6 GM/177ML PO SOLN: ORAL | 0 refills | Status: DC

## 2017-06-26 NOTE — Progress Notes (Signed)
Patient denies any allergies to eggs or soy. Patient denies any problems with anesthesia/sedation. Patient denies any oxygen use at home. Patient denies taking any diet/weight loss medications or blood thinners. EMMI education declined by the pt.  

## 2017-07-10 ENCOUNTER — Ambulatory Visit (AMBULATORY_SURGERY_CENTER): Payer: Medicare Other | Admitting: Internal Medicine

## 2017-07-10 ENCOUNTER — Encounter: Payer: Self-pay | Admitting: Internal Medicine

## 2017-07-10 ENCOUNTER — Other Ambulatory Visit: Payer: Self-pay

## 2017-07-10 VITALS — BP 100/64 | HR 71 | Temp 98.6°F | Resp 20 | Ht 63.0 in | Wt 150.0 lb

## 2017-07-10 DIAGNOSIS — D123 Benign neoplasm of transverse colon: Secondary | ICD-10-CM

## 2017-07-10 DIAGNOSIS — Z800 Family history of malignant neoplasm of digestive organs: Secondary | ICD-10-CM | POA: Diagnosis not present

## 2017-07-10 DIAGNOSIS — E669 Obesity, unspecified: Secondary | ICD-10-CM | POA: Diagnosis not present

## 2017-07-10 DIAGNOSIS — Z86010 Personal history of colonic polyps: Secondary | ICD-10-CM | POA: Diagnosis not present

## 2017-07-10 DIAGNOSIS — F419 Anxiety disorder, unspecified: Secondary | ICD-10-CM | POA: Diagnosis not present

## 2017-07-10 MED ORDER — SODIUM CHLORIDE 0.9 % IV SOLN: 500.0000 mL | Freq: Once | INTRAVENOUS | Status: DC

## 2017-07-10 NOTE — Progress Notes (Signed)
Report to PACU, RN, vss, BBS= Clear.  

## 2017-07-10 NOTE — Progress Notes (Signed)
Pt's states no medical or surgical changes since previsit or office visit. 

## 2017-07-10 NOTE — Progress Notes (Signed)
Called to room to assist during endoscopic procedure.  Patient ID and intended procedure confirmed with present staff. Received instructions for my participation in the procedure from the performing physician.  

## 2017-07-10 NOTE — Patient Instructions (Signed)
   INFORMATION ON POLYPS AND DIVERTICULOSIS GIVEN TO YOU TODAY  AWAIT PATHOLOGY RESULTS ON POLYPS REMOVED    YOU HAD AN ENDOSCOPIC PROCEDURE TODAY AT THE [facility]:   Refer to the procedure report that was given to you for any specific questions about what was found during the examination.  If the procedure report does not answer your questions, please call your gastroenterologist to clarify.  If you requested that your care partner not be given the details of your procedure findings, then the procedure report has been included in a sealed envelope for you to review at your convenience later.  YOU SHOULD EXPECT: Some feelings of bloating in the abdomen. Passage of more gas than usual.  Walking can help get rid of the air that was put into your GI tract during the procedure and reduce the bloating. If you had a lower endoscopy (such as a colonoscopy or flexible sigmoidoscopy) you may notice spotting of blood in your stool or on the toilet paper. If you underwent a bowel prep for your procedure, you may not have a normal bowel movement for a few days.  Please Note:  You might notice some irritation and congestion in your nose or some drainage.  This is from the oxygen used during your procedure.  There is no need for concern and it should clear up in a day or so.  SYMPTOMS TO REPORT IMMEDIATELY:   Following lower endoscopy (colonoscopy or flexible sigmoidoscopy):  Excessive amounts of blood in the stool  Significant tenderness or worsening of abdominal pains  Swelling of the abdomen that is new, acute  Fever of 100F or higher   For urgent or emergent issues, a gastroenterologist can be reached at any hour by calling [phone].   DIET:  We do recommend a small meal at first, but then you may proceed to your regular diet.  Drink plenty of fluids but you should avoid alcoholic beverages for 24 hours.  ACTIVITY:  You should plan to take it easy for the rest of today and you  should NOT DRIVE or use heavy machinery until tomorrow (because of the sedation medicines used during the test).    FOLLOW UP: Our staff will call the number listed on your records the next business day following your procedure to check on you and address any questions or concerns that you may have regarding the information given to you following your procedure. If we do not reach you, we will leave a message.  However, if you are feeling well and you are not experiencing any problems, there is no need to return our call.  We will assume that you have returned to your regular daily activities without incident.  If any biopsies were taken you will be contacted by phone or by letter within the next 1-3 weeks.  Please call us at [phone] if you have not heard about the biopsies in 3 weeks.    SIGNATURES/CONFIDENTIALITY: You and/or your care partner have signed paperwork which will be entered into your electronic medical record.  These signatures attest to the fact that that the information above on your After Visit Summary has been reviewed and is understood.  Full responsibility of the confidentiality of this discharge information lies with you and/or your care-partner. 

## 2017-07-10 NOTE — Op Note (Signed)
[place] Patient Name: [name] Price Procedure Date: 07/10/2017 9:27 AM MRN: [id number] Endoscopist: Jerene Bears , MD Age: 77 Referring MD:  Date of Birth: 1940-03-17 Gender: Female Account #: [id number] Procedure:                Colonoscopy Indications:              Surveillance: Personal history of adenomatous                            polyps on last colonoscopy 3 years ago, Family                            history of colon cancer in a first-degree relative Medicines:                Monitored Anesthesia Care Procedure:                Pre-Anesthesia Assessment:                           - Prior to the procedure, a History and Physical                            was performed, and patient medications and                            allergies were reviewed. The patient's tolerance of                            previous anesthesia was also reviewed. The risks                            and benefits of the procedure and the sedation                            options and risks were discussed with the patient.                            All questions were answered, and informed consent                            was obtained. Prior Anticoagulants: The patient has                            taken no previous anticoagulant or antiplatelet                            agents. ASA Grade Assessment: II - A patient with                            mild systemic disease. After reviewing the risks                            and benefits, the patient was deemed in  satisfactory condition to undergo the procedure.                           After obtaining informed consent, the colonoscope                            was passed under direct vision. Throughout the                            procedure, the patient's blood pressure, pulse, and                            oxygen saturations were monitored continuously. The                            Model PCF-[id number]  [id number]) scope was introduced                            through the anus and advanced to the colocolonic                            anastomosis. The colonoscopy was performed without                            difficulty. The patient tolerated the procedure                            well. The quality of the bowel preparation was                            good. The ileocecal valve, appendiceal orifice, and                            rectum were photographed. Scope In: 9:39:58 AM Scope Out: 9:52:31 AM Scope Withdrawal Time: 0 hours 10 minutes 4 seconds  Total Procedure Duration: 0 hours 12 minutes 33 seconds  Findings:                 The digital rectal exam was normal.                           Two sessile polyps were found in the hepatic                            flexure. The polyps were 2 to 3 mm in size. These                            polyps were removed with a cold snare. Resection                            and retrieval were complete.                           A few small-mouthed diverticula were found in the  sigmoid colon and ascending colon.                           The exam was otherwise without abnormality on                            direct and retroflexion views. Complications:            No immediate complications. Estimated Blood Loss:     Estimated blood loss was minimal. Impression:               - Two 2 to 3 mm polyps at the hepatic flexure,                            removed with a cold snare. Resected and retrieved.                           - Diverticulosis in the sigmoid colon and in the                            ascending colon.                           - The examination was otherwise normal on direct                            and retroflexion views. Recommendation:           - Patient has a contact number available for                            emergencies. The signs and symptoms of potential                             delayed complications were discussed with the                            patient. Return to normal activities tomorrow.                            Written discharge instructions were provided to the                            patient.                           - Resume previous diet.                           - Continue present medications.                           - Await pathology results.                           - Repeat colonoscopy in 5 years for surveillance. Jerene Bears, MD 07/10/2017 10:04:39 AM This report  has been signed electronically.

## 2017-07-11 ENCOUNTER — Telehealth: Payer: Self-pay | Admitting: *Deleted

## 2017-07-11 NOTE — Telephone Encounter (Signed)
  Follow up Call-  Call back number 07/10/2017  Post procedure Call Back phone  # [phone]  Permission to leave phone message Yes  Some recent data might be hidden     Patient questions:  Do you have a fever, pain , or abdominal swelling? No. Pain Score  0 *  Have you tolerated food without any problems? Yes.    Have you been able to return to your normal activities? Yes.    Do you have any questions about your discharge instructions: Diet   No. Medications  No. Follow up visit  No.  Do you have questions or concerns about your Care? No.  Actions: * If pain score is 4 or above: No action needed, pain <4.

## 2017-07-14 ENCOUNTER — Encounter: Payer: Self-pay | Admitting: Internal Medicine

## 2017-11-11 DIAGNOSIS — M8588 Other specified disorders of bone density and structure, other site: Secondary | ICD-10-CM | POA: Diagnosis not present

## 2017-11-11 DIAGNOSIS — Z1231 Encounter for screening mammogram for malignant neoplasm of breast: Secondary | ICD-10-CM | POA: Diagnosis not present

## 2017-11-11 DIAGNOSIS — M810 Age-related osteoporosis without current pathological fracture: Secondary | ICD-10-CM | POA: Diagnosis not present

## 2017-11-11 DIAGNOSIS — Z853 Personal history of malignant neoplasm of breast: Secondary | ICD-10-CM | POA: Diagnosis not present

## 2017-11-11 LAB — HM MAMMOGRAPHY

## 2017-11-18 ENCOUNTER — Encounter: Payer: Self-pay | Admitting: Family Medicine

## 2017-11-22 ENCOUNTER — Ambulatory Visit (INDEPENDENT_AMBULATORY_CARE_PROVIDER_SITE_OTHER): Payer: Medicare Other | Admitting: Family Medicine

## 2017-11-22 ENCOUNTER — Encounter: Payer: Self-pay | Admitting: Family Medicine

## 2017-11-22 DIAGNOSIS — M810 Age-related osteoporosis without current pathological fracture: Secondary | ICD-10-CM

## 2017-11-22 NOTE — Progress Notes (Signed)
Osteoporosis.  Not on tx prev.  Vit D prev wnl.  Prev imaging d/w pt. osteoporosis pathophysiology discussed with patient.  See plan.  Meds, vitals, and allergies reviewed.   ROS: Per HPI unless specifically indicated in ROS section   nad Remainder of exam deferred.

## 2017-11-22 NOTE — Patient Instructions (Signed)
Call the ortho clinic and talk to them about your hip popping.   Ask them if taking fosamax would cause a problem in the meantime.  Think about taking fosamax and update me as needed.  Take care.  Glad to see you.

## 2017-11-24 NOTE — Assessment & Plan Note (Signed)
D/w pt about DXA results and osteoporosis path/phys in general, including vit D and calcium.  Reasonable to consider treatment with bisphosphonate, ie fosamax.  D/w pt about risk benefit, especially GI sx, jaw and long bone pathology.   At this point it is likely reasonable to defer tx until she has f/u with ortho.  She has occasional popping in the L hip.  She doesn't have full dislocation.  She can still bear weight.  I want her to update the Ortho clinic in general and also get their input about possible start of Fosamax.  I do not want starting Fosamax to interfere with any of their treatment plan.  She agrees.  I will await update from the patient/orthopedics.  >25 minutes spent in face to face time with patient, >50% spent in counselling or coordination of care.

## 2017-12-04 ENCOUNTER — Telehealth: Payer: Self-pay | Admitting: Family Medicine

## 2017-12-04 NOTE — Telephone Encounter (Signed)
Copied from [place] [id number]. Topic: Quick Communication - See Telephone Encounter >> Dec 04, 2017  1:39 PM Vernona Rieger wrote: CRM for notification. See Telephone encounter for: 12/04/17.  Patient states that her orthopedic doctor, states she can go ahead and take the " fosamax ". She was advised to let Dr Damita Dunnings know if that was approved by her orthopedic doctor. She would like that to be called in to her pharmacy and please contact patient.   [id number] [place], [place] [address] [place]  [id number]

## 2017-12-05 MED ORDER — ALENDRONATE SODIUM 70 MG PO TABS: 70.0000 mg | ORAL_TABLET | ORAL | 11 refills | Status: DC

## 2017-12-05 NOTE — Telephone Encounter (Signed)
rx sent. If she has any trouble taking the med- heartburn, etc, then let me know.  Thanks.

## 2017-12-05 NOTE — Telephone Encounter (Signed)
Patient advised.

## 2018-06-19 ENCOUNTER — Ambulatory Visit: Payer: Medicare Other

## 2018-06-23 ENCOUNTER — Encounter: Payer: Medicare Other | Admitting: Family Medicine

## 2018-06-24 ENCOUNTER — Ambulatory Visit (INDEPENDENT_AMBULATORY_CARE_PROVIDER_SITE_OTHER): Payer: Medicare Other

## 2018-06-24 ENCOUNTER — Other Ambulatory Visit (INDEPENDENT_AMBULATORY_CARE_PROVIDER_SITE_OTHER): Payer: Medicare Other

## 2018-06-24 DIAGNOSIS — E7800 Pure hypercholesterolemia, unspecified: Secondary | ICD-10-CM | POA: Diagnosis not present

## 2018-06-24 DIAGNOSIS — Z0000 Encounter for general adult medical examination without abnormal findings: Secondary | ICD-10-CM | POA: Diagnosis not present

## 2018-06-24 LAB — LIPID PANEL
Cholesterol: 176 mg/dL (ref 0–200)
HDL: 55.4 mg/dL (ref >39.00)
LDL Cholesterol: 107 mg/dL — ABNORMAL HIGH (ref 0–99)
NonHDL: 120.74
Total CHOL/HDL Ratio: 3
Triglycerides: 71 mg/dL (ref 0.0–149.0)
VLDL: 14.2 mg/dL (ref 0.0–40.0)

## 2018-06-24 NOTE — Progress Notes (Signed)
PCP notes:   Health maintenance:  No gaps identified.  Abnormal screenings:   Fall risk - hx of multiple falls Fall Risk  06/24/2018 06/17/2017 06/15/2016 05/30/2015  Falls in the past year? 1 No No No  Comment 2 falls related to malfunctioning prosthesis in left hip; denies injury - - -  Number falls in past yr: 1 - - -  Injury with Fall? 0 - - -  Risk for fall due to : History of fall(s);Impaired balance/gait - - -    Patient concerns:   None  Nurse concerns:  None  Next PCP appt:   06/26/18 @ 6283  I reviewed health advisor's note, was available for consultation on the day of service listed in this note, and agree with documentation and plan. Elsie Stain, MD.

## 2018-06-24 NOTE — Patient Instructions (Signed)
[name] Price , Thank you for taking time to come for your Medicare Wellness Visit. I appreciate your ongoing commitment to your health goals. Please review the following plan we discussed and let me know if I can assist you in the future.   These are the goals we discussed: Goals    . Patient Stated     Starting 06/24/18, I will continue to take medications as prescribed.        This is a list of the screening recommended for you and due dates:  Health Maintenance  Topic Date Due  . Flu Shot  09/13/2018  . Mammogram  11/12/2018  . Colon Cancer Screening  07/11/2022  . Tetanus Vaccine  03/13/2025  . DEXA scan (bone density measurement)  Completed  . Pneumonia vaccines  Completed   Preventive Care for Adults  A healthy lifestyle and preventive care can promote health and wellness. Preventive health guidelines for adults include the following key practices.  . A routine yearly physical is a good way to check with your health care provider about your health and preventive screening. It is a chance to share any concerns and updates on your health and to receive a thorough exam.  . Visit your dentist for a routine exam and preventive care every 6 months. Brush your teeth twice a day and floss once a day. Good oral hygiene prevents tooth decay and gum disease.  . The frequency of eye exams is based on your age, health, family medical history, use  of contact lenses, and other factors. Follow your health care provider's recommendations for frequency of eye exams.  . Eat a healthy diet. Foods like vegetables, fruits, whole grains, low-fat dairy products, and lean protein foods contain the nutrients you need without too many calories. Decrease your intake of foods high in solid fats, added sugars, and salt. Eat the right amount of calories for you. Get information about a proper diet from your health care provider, if necessary.  . Regular physical exercise is one of the most important things  you can do for your health. Most adults should get at least 150 minutes of moderate-intensity exercise (any activity that increases your heart rate and causes you to sweat) each week. In addition, most adults need muscle-strengthening exercises on 2 or more days a week.  Silver Sneakers may be a benefit available to you. To determine eligibility, you may visit the website: www.silversneakers.com or contact program at (816)232-9521 Mon-Fri between 8AM-8PM.   . Maintain a healthy weight. The body mass index (BMI) is a screening tool to identify possible weight problems. It provides an estimate of body fat based on height and weight. Your health care provider can find your BMI and can help you achieve or maintain a healthy weight.   For adults 20 years and older: ? A BMI below 18.5 is considered underweight. ? A BMI of 18.5 to 24.9 is normal. ? A BMI of 25 to 29.9 is considered overweight. ? A BMI of 30 and above is considered obese.   . Maintain normal blood lipids and cholesterol levels by exercising and minimizing your intake of saturated fat. Eat a balanced diet with plenty of fruit and vegetables. Blood tests for lipids and cholesterol should begin at age 72 and be repeated every 5 years. If your lipid or cholesterol levels are high, you are over 50, or you are at high risk for heart disease, you may need your cholesterol levels checked more frequently. Ongoing high lipid  and cholesterol levels should be treated with medicines if diet and exercise are not working.  . If you smoke, find out from your health care provider how to quit. If you do not use tobacco, please do not start.  . If you choose to drink alcohol, please do not consume more than 2 drinks per day. One drink is considered to be 12 ounces (355 mL) of beer, 5 ounces (148 mL) of wine, or 1.5 ounces (44 mL) of liquor.  . If you are 62-24 years old, ask your health care provider if you should take aspirin to prevent strokes.  . Use  sunscreen. Apply sunscreen liberally and repeatedly throughout the day. You should seek shade when your shadow is shorter than you. Protect yourself by wearing long sleeves, pants, a wide-brimmed hat, and sunglasses year round, whenever you are outdoors.  . Once a month, do a whole body skin exam, using a mirror to look at the skin on your back. Tell your health care provider of new moles, moles that have irregular borders, moles that are larger than a pencil eraser, or moles that have changed in shape or color.

## 2018-06-24 NOTE — Progress Notes (Signed)
Subjective:   [name] Price is a 78 y.o. female who presents for Medicare Annual (Subsequent) preventive examination.  Review of Systems:  N/A Cardiac Risk Factors include: advanced age (>6men, >68 women);dyslipidemia     Objective:     Vitals: There were no vitals taken for this visit.  There is no height or weight on file to calculate BMI.  Advanced Directives 06/24/2018 07/10/2017 06/17/2017 07/10/2016 06/15/2016 05/30/2015 03/26/2014  Does Patient Have a Medical Advance Directive? Yes No Yes No Yes Yes Yes  Type of Paramedic of [place];Living will - [place];Living will - Healthcare Power of [place]  Does patient want to make changes to medical advance directive? - - - - - No - Patient declined -  Copy of [place] in Chart? No - copy requested - No - copy requested - No - copy requested No - copy requested -  Would patient like information on creating a medical advance directive? - No - Patient declined - No - Patient declined - - -    Tobacco Social History   Tobacco Use  Smoking Status Never Smoker  Smokeless Tobacco Never Used     Counseling given: No   Clinical Intake:  Pre-visit preparation completed: Yes  Pain : No/denies pain Pain Score: 0-No pain     Nutritional Status: BMI 25 -29 Overweight Nutritional Risks: None Diabetes: No  How often do you need to have someone help you when you read instructions, pamphlets, or other written materials from your doctor or pharmacy?: 1 - Never What is the last grade level you completed in school?: 12th grade  Interpreter Needed?: No  Comments: pt lives with spouse Information entered by :: LPinson, LPN  Past Medical History:  Diagnosis Date  . Allergy   . Anxiety   . Arthritis   . Blood transfusion without reported diagnosis    as baby  . Breast cancer ([place]) 1999-2000   s/p lumpectomy  and radiation, no chemo-right  . History of chicken pox   . Hyperlipidemia   . Osteoporosis    DXA 11/12/17  . UTI (urinary tract infection)    Past Surgical History:  Procedure Laterality Date  . BREAST SURGERY Right   . CATARACT EXTRACTION W/PHACO Left 12/28/2014   Procedure: CATARACT EXTRACTION PHACO AND INTRAOCULAR LENS PLACEMENT (IOC);  Surgeon: Birder Robson, MD;  Location: [facility] ORS;  Service: Ophthalmology;  Laterality: Left;  [place] 00:38 AP% 19.9 CDE 7.70 fluid pack lot # [id number]  . CATARACT EXTRACTION W/PHACO Right 07/10/2016   Procedure: CATARACT EXTRACTION PHACO AND INTRAOCULAR LENS PLACEMENT (IOC);  Surgeon: Birder Robson, MD;  Location: [facility] ORS;  Service: Ophthalmology;  Laterality: Right;  [place] 00:38 AP% 18.3 CDE 7.06 Fluid pack lot # [id number]  . COLONOSCOPY  2016  . EYE SURGERY    . JOINT REPLACEMENT Bilateral    hip  / shoulder  . OOPHORECTOMY Left   . POLYPECTOMY    . TOTAL HIP ARTHROPLASTY Bilateral    (2) Two  . TOTAL SHOULDER REPLACEMENT     Family History  Problem Relation Age of Onset  . Lung cancer Mother   . Heart disease Mother   . Colon cancer Mother        late 69's  . Cancer Father   . Colon cancer Father 44       died at 63  . Breast cancer Maternal Aunt   .  Breast cancer Maternal Aunt   . Breast cancer Maternal Aunt   . Rectal cancer Neg Hx   . Stomach cancer Neg Hx   . Crohn's disease Neg Hx   . Esophageal cancer Neg Hx    Social History   Socioeconomic History  . Marital status: Married    Spouse name: Not on file  . Number of children: Not on file  . Years of education: Not on file  . Highest education level: Not on file  Occupational History  . Not on file  Social Needs  . Financial resource strain: Not on file  . Food insecurity:    Worry: Not on file    Inability: Not on file  . Transportation needs:    Medical: Not on file    Non-medical: Not on file  Tobacco Use  . Smoking status: Never Smoker  . Smokeless  tobacco: Never Used  Substance and Sexual Activity  . Alcohol use: No    Comment: rare once a year  . Drug use: No  . Sexual activity: Not Currently  Lifestyle  . Physical activity:    Days per week: Not on file    Minutes per session: Not on file  . Stress: Not on file  Relationships  . Social connections:    Talks on phone: Not on file    Gets together: Not on file    Attends religious service: Not on file    Active member of club or organization: Not on file    Attends meetings of clubs or organizations: Not on file    Relationship status: Not on file  Other Topics Concern  . Not on file  Social History Narrative   Married 1963   Likes to travel   2 daughters    Outpatient Encounter Medications as of 06/24/2018  Medication Sig  . alendronate (FOSAMAX) 70 MG tablet Take 1 tablet (70 mg total) by mouth every 7 (seven) days. Take with a full glass of water on an empty stomach.  . calcium carbonate (OS-CAL) 600 MG TABS Take 600 mg by mouth 2 (two) times daily with a meal.  . Cholecalciferol (VITAMIN D) 2000 units tablet Take 2,000 Units by mouth daily.  . Multiple Vitamins-Minerals (CENTRUM SILVER ULTRA WOMENS PO) Take 1 tablet by mouth daily.  . naproxen sodium (ALEVE) 220 MG tablet Take 220 mg by mouth 2 (two) times daily as needed.  Marland Kitchen PARoxetine (PAXIL-CR) 25 MG 24 hr tablet Take 25 mg by mouth daily.  . [DISCONTINUED] Na Sulfate-K Sulfate-Mg Sulf 17.5-3.13-1.6 GM/177ML SOLN Suprep (no substitutions)-TAKE AS DIRECTED.  . [DISCONTINUED] Safflower Oil (CLA) 1000 MG CAPS Take 1 capsule by mouth daily.   Facility-Administered Encounter Medications as of 06/24/2018  Medication  . 0.9 %  sodium chloride infusion    Activities of Daily Living In your present state of health, do you have any difficulty performing the following activities: 06/24/2018  Hearing? N  Vision? N  Difficulty concentrating or making decisions? N  Walking or climbing stairs? N  Dressing or bathing? N   Doing errands, shopping? N  Preparing Food and eating ? N  Using the Toilet? N  In the past six months, have you accidently leaked urine? N  Do you have problems with loss of bowel control? N  Managing your Medications? N  Managing your Finances? N  Housekeeping or managing your Housekeeping? N  Some recent data might be hidden    Patient Care Team: Tonia Ghent, MD  as PCP - General (Family Medicine)    Assessment:   This is a routine wellness examination for [name] Price.  Exercise Activities and Dietary recommendations Current Exercise Habits: Home exercise routine, Type of exercise: walking, Time (Minutes): 15, Frequency (Times/Week): 7, Weekly Exercise (Minutes/Week): 105, Intensity: Mild, Exercise limited by: None identified  Goals    . Patient Stated     Starting 06/24/18, I will continue to take medications as prescribed.        Fall Risk Fall Risk  06/24/2018 06/17/2017 06/15/2016 05/30/2015  Falls in the past year? 1 No No No  Comment due to hip left - - -  Number falls in past yr: 1 - - -  Injury with Fall? 0 - - -  Risk for fall due to : History of fall(s);Impaired balance/gait - - -   Depression Screen PHQ 2/9 Scores 06/24/2018 06/17/2017 06/15/2016 05/30/2015  PHQ - 2 Score 0 0 0 0  PHQ- 9 Score 0 0 - -     Cognitive Function MMSE - Mini Mental State Exam 06/24/2018 06/17/2017 06/15/2016 05/30/2015  Orientation to time 5 5 5 5   Orientation to Place 5 5 5 5   Registration 3 3 3 3   Attention/ Calculation 0 0 0 0  Recall 3 3 3 2   Recall-comments - - - recalled 2 of 3 words without cue  Language- name 2 objects 0 0 0 0  Language- repeat 1 1 1 1   Language- follow 3 step command 0 3 3 3   Language- read & follow direction 0 0 0 0  Write a sentence 0 0 0 0  Copy design 0 0 0 0  Total score 17 20 20 19      PLEASE NOTE: A Mini-Cog screen was completed. Maximum score is 17. A value of 0 denotes this part of [place] MMSE was not completed or the patient failed this part of the  Mini-Cog screening.   Mini-Cog Screening Orientation to Time - Max 5 pts Orientation to Place - Max 5 pts Registration - Max 3 pts Recall - Max 3 pts Language Repeat - Max 1 pts      Immunization History  Administered Date(s) Administered  . Influenza Split 11/10/2010  . Influenza Whole 11/13/2007  . Influenza, High Dose Seasonal PF 12/27/2015, 11/08/2016, 11/12/2017  . Influenza-Unspecified 11/12/2013  . Pneumococcal Conjugate-13 05/30/2015, 12/04/2016  . Pneumococcal Polysaccharide-23 03/03/2013  . Td 02/13/1991, 03/14/2015    Screening Tests Health Maintenance  Topic Date Due  . INFLUENZA VACCINE  09/13/2018  . MAMMOGRAM  11/12/2018  . COLONOSCOPY  07/11/2022  . TETANUS/TDAP  03/13/2025  . DEXA SCAN  Completed  . PNA vac Low Risk Adult  Completed      Plan:     I have personally reviewed, addressed, and noted the following in the patient's chart:  A. Medical and social history B. Use of alcohol, tobacco or illicit drugs  C. Current medications and supplements D. Functional ability and status E.  Nutritional status F.  Physical activity G. Advance directives H. List of other physicians I.  Hospitalizations, surgeries, and ER visits in previous 12 months J.  Vitals (unless it is a telemedicine encounter) K. Screenings to include cognitive, depression, hearing, vision (NOTE: hearing and vision screenings not completed in telemedicine encounter) L. Referrals and appointments   In addition, I have reviewed and discussed with patient certain preventive protocols, quality metrics, and best practice recommendations. A written personalized care plan for preventive services and recommendations were provided  to patient.  With patient's permission, we connected on 06/24/18 at  9:00 AM EDT by a video enabled telemedicine application. Two patient identifiers were used to ensure the encounter occurred with the correct person.    Patient was in home and writer was in office.    Signed,   Lindell Noe, MHA, BS, LPN Health Coach

## 2018-06-26 ENCOUNTER — Ambulatory Visit (INDEPENDENT_AMBULATORY_CARE_PROVIDER_SITE_OTHER): Payer: Medicare Other | Admitting: Family Medicine

## 2018-06-26 ENCOUNTER — Encounter: Payer: Self-pay | Admitting: Family Medicine

## 2018-06-26 DIAGNOSIS — Z96649 Presence of unspecified artificial hip joint: Secondary | ICD-10-CM | POA: Insufficient documentation

## 2018-06-26 DIAGNOSIS — Z96643 Presence of artificial hip joint, bilateral: Secondary | ICD-10-CM | POA: Diagnosis not present

## 2018-06-26 DIAGNOSIS — E7800 Pure hypercholesterolemia, unspecified: Secondary | ICD-10-CM

## 2018-06-26 DIAGNOSIS — R232 Flushing: Secondary | ICD-10-CM

## 2018-06-26 DIAGNOSIS — Z7189 Other specified counseling: Secondary | ICD-10-CM

## 2018-06-26 DIAGNOSIS — M810 Age-related osteoporosis without current pathological fracture: Secondary | ICD-10-CM | POA: Diagnosis not present

## 2018-06-26 DIAGNOSIS — Z0000 Encounter for general adult medical examination without abnormal findings: Secondary | ICD-10-CM

## 2018-06-26 NOTE — Assessment & Plan Note (Signed)
Colonoscopy - 2019 Mammogram 2019 Osteoporosis d/w pt.  DXA 11/2017.  Started on alendronate 11/2017.  No ADE on med.  Routine use d/w pt.   Tetanus 2017 PNA up to date Flu 2019 Shingles shot prev done.  D/w pt.   Living will d/w pt.  Husband designated if patient were incapacitated.

## 2018-06-26 NOTE — Assessment & Plan Note (Signed)
S/p hip replacement.  She has some "slipping" with the L hip and has talked with ortho.  She was putting off further replacement/repair if possible.  She is some better now compared to prev. She doesn't have pain with episodes . She used cane as needed, at baseline.  She is finishing her rx for toradol.  nsaid cautions d/w pt.

## 2018-06-26 NOTE — Progress Notes (Signed)
Virtual visit completed through WebEx or similar program Patient location: home  Provider location: [place] at [facility], office   Limitations and rationale for visit method d/w patient.  Patient agreed to proceed.   CC: follow up.   HPI:  Colonoscopy - 2019 Mammogram 2019 Osteoporosis d/w pt.  DXA 11/2017.  Started on alendronate 11/2017.  No ADE on med.  Routine use d/w pt.   Tetanus 2017 PNA up to date Flu 2019 Shingles shot prev done.  D/w pt.   Living will d/w pt.  Husband designated if patient were incapacitated.    On paxil for hot flashes per gyn with relief.  I'll defer.  she can ask gyn about considering taper.  She agrees.    S/p hip replacement.  She has some "slipping" with the L hip and has talked with ortho.  She was putting off further replacement/repair if possible.  She is some better now compared to prev. She doesn't have pain with episodes . She used cane as needed, at baseline.  She is finishing her rx for toradol.  nsaid cautions d/w pt.    Osteoporosis d/w pt.  DXA 11/2017.  Started on alendronate 11/2017.  No ADE on med.  Routine use d/w pt.    HLD.  Lipids clearly improved.  She isn't on statin.  She has been working on diet over the last year.    Prev labs d/w pt.   Pandemic considerations d/w pt.    PMH SH FH reviewed.   Meds and allergies reviewed.   ROS: Per HPI unless specifically indicated in ROS section   NAD Speech wnl  A/P:  Colonoscopy - 2019 Mammogram 2019 Osteoporosis d/w pt.  DXA 11/2017.  Started on alendronate 11/2017.  No ADE on med.  Routine use d/w pt.   Tetanus 2017 PNA up to date Flu 2019 Shingles shot prev done.  D/w pt.   Living will d/w pt.  Husband designated if patient were incapacitated.    On paxil for hot flashes per gyn with relief.  I'll defer.  she can ask gyn about considering taper.  She agrees.    S/p hip replacement.  She has some "slipping" with the L hip and has talked with ortho.  She was putting  off further replacement/repair if possible.  She is some better now compared to prev. She doesn't have pain with episodes . She used cane as needed, at baseline.  She is finishing her rx for toradol.  nsaid cautions d/w pt.    Osteoporosis d/w pt.  DXA 11/2017.  Started on alendronate 11/2017.  No ADE on med.  Routine use d/w pt.  Continue as is for now.   HLD.  Lipids clearly improved.  She isn't on statin.  She has been working on diet over the last year.  Continue as is.  She agrees.

## 2018-06-26 NOTE — Assessment & Plan Note (Signed)
Osteoporosis d/w pt.  DXA 11/2017.  Started on alendronate 11/2017.  No ADE on med.  Routine use d/w pt.  Continue as is for now. Prev vit D wnl. Prev Cr wnl.

## 2018-06-26 NOTE — Assessment & Plan Note (Signed)
Living will d/w pt.  Husband designated if patient were incapacitated.  

## 2018-06-26 NOTE — Assessment & Plan Note (Signed)
Lipids clearly improved.  She isn't on statin.  She has been working on diet over the last year.  Continue as is.  She agrees.

## 2018-06-26 NOTE — Assessment & Plan Note (Signed)
On paxil for hot flashes per gyn with relief.  I'll defer.  she can ask gyn about considering taper.  She agrees.

## 2018-10-27 DIAGNOSIS — Z23 Encounter for immunization: Secondary | ICD-10-CM | POA: Diagnosis not present

## 2018-10-31 ENCOUNTER — Other Ambulatory Visit: Payer: Self-pay | Admitting: Family Medicine

## 2018-10-31 NOTE — Telephone Encounter (Signed)
Electronic refill request. Alendronate Last office visit:   06/26/2018 Last Filled:     4 tablet 11 12/05/2017  Does patient continue medication?

## 2018-11-02 NOTE — Telephone Encounter (Signed)
Yes, goal for 5 years of treatment.  Sent.  Thanks.

## 2018-11-17 DIAGNOSIS — Z1231 Encounter for screening mammogram for malignant neoplasm of breast: Secondary | ICD-10-CM | POA: Diagnosis not present

## 2018-11-17 DIAGNOSIS — Z853 Personal history of malignant neoplasm of breast: Secondary | ICD-10-CM | POA: Diagnosis not present

## 2018-11-20 LAB — HM MAMMOGRAPHY

## 2018-11-24 ENCOUNTER — Telehealth: Payer: Self-pay

## 2018-11-24 NOTE — Telephone Encounter (Signed)
I spoke with patient in regards to mammogram & needing further imaging. She said that she had already gone back for further imaging & everything looked normal this time. She said that you should be getting results from [facility].

## 2018-11-24 NOTE — Telephone Encounter (Signed)
Patient notified that ammogram done on 10/8 was negative. Abstracted in pt's chart.

## 2019-02-25 ENCOUNTER — Other Ambulatory Visit: Payer: Self-pay

## 2019-02-25 ENCOUNTER — Ambulatory Visit (INDEPENDENT_AMBULATORY_CARE_PROVIDER_SITE_OTHER): Payer: Medicare Other | Admitting: Family Medicine

## 2019-02-25 ENCOUNTER — Encounter: Payer: Self-pay | Admitting: Family Medicine

## 2019-02-25 VITALS — BP 112/78 | HR 78 | Temp 98.3°F | Ht 61.75 in | Wt 148.8 lb

## 2019-02-25 DIAGNOSIS — M549 Dorsalgia, unspecified: Secondary | ICD-10-CM | POA: Diagnosis not present

## 2019-02-25 MED ORDER — DICLOFENAC SODIUM 75 MG PO TBEC: 75.0000 mg | DELAYED_RELEASE_TABLET | Freq: Two times a day (BID) | ORAL | 0 refills | Status: DC

## 2019-02-25 MED ORDER — TIZANIDINE HCL 4 MG PO TABS: 2.0000 mg | ORAL_TABLET | Freq: Every evening | ORAL | 0 refills | Status: DC | PRN

## 2019-02-25 NOTE — Progress Notes (Signed)
[name] [name] [place] at [facility] [place], [address] Phone: [phone]  FAX: [phone]  [name] Price - 79 y.o. female  MRN [id number]  Date of Birth: May 19, 1940  Visit Date: 02/25/2019  PCP: Tonia Ghent, Price  Referred by: Tonia Ghent, Price  Chief Complaint  Patient presents with  . Back Pain    sharp, belt line    This visit occurred during the SARS-CoV-2 public health emergency.  Safety protocols were in place, including screening questions prior to the visit, additional usage of staff PPE, and extensive cleaning of exam room while observing appropriate contact time as indicated for disinfecting solutions.   Subjective:   [name] Price is a 79 y.o. very pleasant female patient with Body mass index is 27.44 kg/m. who presents with the following:  She is here for an evaluation of back pain.  Sat abruptly started and on has been getting better a little .Some better. Using a cane now - not at baseline. Did a lot of housework. Moved some furniture. Maybe some asleep in her on the right. No weakness. Moved her 61 yo grandson a lot. No radicular pain, numbness, or tingling.  No focal weakness. R > L lumbar pain not referred into the buttocks.  She does not have hip pain and is s/p THA.  Past Medical History, Surgical History, Social History, Family History, Problem List, Medications, and Allergies have been reviewed and updated if relevant.   GEN: No fevers, chills. Nontoxic. Primarily MSK c/o today. MSK: Detailed in the HPI GI: tolerating PO intake without difficulty Neuro: No numbness, parasthesias, or tingling associated. Otherwise the pertinent positives of the ROS are noted above.   Objective:   BP 112/78   Pulse 78   Temp 98.3 F (36.8 C) (Temporal)   Ht 5' 1.75" (1.568 m)   Wt 148 lb 12.8 oz (67.5 kg)   SpO2 95%   BMI 27.44 kg/m    GEN:  Well-developed,well-nourished,in no acute distress; alert,appropriate and cooperative throughout examination HEENT: Normocephalic and atraumatic without obvious abnormalities. Ears, externally no deformities PULM: Breathing comfortably in no respiratory distress EXT: No clubbing, cyanosis, or edema PSYCH: Normally interactive. Cooperative during the interview. Pleasant. Friendly and conversant. Not anxious or depressed appearing. Normal, full affect.  Range of motion at  the waist: Flexion, extension, lateral bending and rotation:  15% loss rom in all directions.  No echymosis or edema Rises to examination table with mild difficulty Gait: minimally antalgic  Inspection/Deformity: N Paraspinus Tenderness: l2-s1 R >>L  B Ankle Dorsiflexion (L5,4): 5/5 B Great Toe Dorsiflexion (L5,4): 5/5 Heel Walk (L5): WNL Toe Walk (S1): WNL Rise/Squat (L4): WNL, mild pain  SENSORY B Medial Foot (L4): WNL B Dorsum (L5): WNL B Lateral (S1): WNL Light Touch: WNL Pinprick: WNL  REFLEXES Knee (L4): 2+ Ankle (S1): 2+  B SLR, seated: neg B SLR, supine: neg B FABER: neg B Reverse FABER: neg B Greater Troch: NT B Log Roll: neg B Stork: NT B Sciatic Notch: NT   Radiology: No results found.  Assessment and Plan:     ICD-10-CM   1. Acute back pain, unspecified back location, unspecified back pain laterality  M54.9    Level of Medical Decision-Making in this case is Moderate.   Classic LBP without sign of neurological component.  Walk normally, heat, massage, hot shower / bath. 75% will resolve by 3 weeks, > 95%  will resolve within 6 weeks.  F/u with me 4 weeks without significant improvement.  Meds ordered this encounter  Medications  . diclofenac (VOLTAREN) 75 MG EC tablet    Sig: Take 1 tablet (75 mg total) by mouth 2 (two) times daily.    Dispense:  60 tablet    Refill:  0  . tiZANidine (ZANAFLEX) 4 MG tablet    Sig: Take 0.5-1 tablets (2-4 mg total) by mouth at bedtime as  needed for muscle spasms.    Dispense:  30 tablet    Refill:  0   No orders of the defined types were placed in this encounter.   Signed,  Maud Deed. Dymond Spreen, Price   Outpatient Encounter Medications as of 02/25/2019  Medication Sig  . calcium carbonate (OS-CAL) 600 MG TABS Take 600 mg by mouth 2 (two) times daily with a meal.  . Cholecalciferol (VITAMIN D) 2000 units tablet Take 2,000 Units by mouth daily.  . Cyanocobalamin 1000 MCG TBCR Take 1,000 mcg by mouth daily.  . diclofenac (VOLTAREN) 75 MG EC tablet Take 1 tablet (75 mg total) by mouth 2 (two) times daily.  Marland Kitchen ketorolac (TORADOL) 10 MG tablet TK 1 T PO QID  . Multiple Vitamins-Minerals (CENTRUM SILVER ULTRA WOMENS PO) Take 1 tablet by mouth daily.  Marland Kitchen PARoxetine (PAXIL-CR) 25 MG 24 hr tablet Take 25 mg by mouth daily.  Marland Kitchen tiZANidine (ZANAFLEX) 4 MG tablet Take 0.5-1 tablets (2-4 mg total) by mouth at bedtime as needed for muscle spasms.  . [DISCONTINUED] alendronate (FOSAMAX) 70 MG tablet TAKE 1 TABLET BY MOUTH EVERY 7 DAYS. TAKE WITH A FULL GLASS OF WATER ON AN EMPTY STOMACH.   No facility-administered encounter medications on file as of 02/25/2019.

## 2019-03-07 ENCOUNTER — Ambulatory Visit: Payer: Medicare Other | Attending: Internal Medicine

## 2019-03-07 DIAGNOSIS — Z23 Encounter for immunization: Secondary | ICD-10-CM | POA: Insufficient documentation

## 2019-03-07 NOTE — Progress Notes (Signed)
   Covid-19 Vaccination Clinic  Name:  [name] Price    MRN: [id number] DOB: 25-Jan-1941  03/07/2019  [name] was observed post Covid-19 immunization for 15 minutes without incidence. She was provided with Vaccine Information Sheet and instruction to access the V-Safe system.   [name] was instructed to call 911 with any severe reactions post vaccine: Marland Kitchen Difficulty breathing  . Swelling of your face and throat  . A fast heartbeat  . A bad rash all over your body  . Dizziness and weakness    Immunizations Administered    Name Date Dose VIS Date Route   Pfizer COVID-19 Vaccine 03/07/2019 12:07 PM 0.3 mL 01/23/2019 Intramuscular   Manufacturer: [place]   Lot: [id number]   [place]: [id number]

## 2019-03-19 ENCOUNTER — Other Ambulatory Visit: Payer: Self-pay | Admitting: Family Medicine

## 2019-03-19 NOTE — Telephone Encounter (Signed)
Last office visit 02/25/2019 for Acute back pain with Dr. Lorelei Pont.  Last refilled 02/25/2019 for #30 with no refills.  No future appointments.

## 2019-03-20 ENCOUNTER — Other Ambulatory Visit: Payer: Self-pay | Admitting: Family Medicine

## 2019-03-20 NOTE — Telephone Encounter (Signed)
Patient advised.

## 2019-03-20 NOTE — Telephone Encounter (Signed)
Sent.  If she is not improving then please let us know/schedule follow-up.  Thanks.

## 2019-03-23 NOTE — Telephone Encounter (Signed)
Electronic refill request. Diclofenac Last office visit:   03/15/2019 Acute - [place] Last Filled:     60 tablet 0 02/25/2019  Please advise.

## 2019-03-24 NOTE — Telephone Encounter (Signed)
Sent. Thanks.  If she is not getting better then let us know.  Thanks.

## 2019-03-28 ENCOUNTER — Ambulatory Visit: Payer: Medicare Other | Attending: Internal Medicine

## 2019-03-28 DIAGNOSIS — Z23 Encounter for immunization: Secondary | ICD-10-CM | POA: Insufficient documentation

## 2019-03-28 NOTE — Progress Notes (Signed)
   Covid-19 Vaccination Clinic  Name:  [name] Price    MRN: [id number] DOB: May 18, 1940  03/28/2019  [name] was observed post Covid-19 immunization for 15 minutes without incidence. She was provided with Vaccine Information Sheet and instruction to access the V-Safe system.   [name] was instructed to call 911 with any severe reactions post vaccine: Marland Kitchen Difficulty breathing  . Swelling of your face and throat  . A fast heartbeat  . A bad rash all over your body  . Dizziness and weakness    Immunizations Administered    Name Date Dose VIS Date Route   Pfizer COVID-19 Vaccine 03/28/2019 10:38 AM 0.3 mL 01/23/2019 Intramuscular   Manufacturer: [place]   Lot: [id number]   [place]: [id number]

## 2019-04-12 ENCOUNTER — Other Ambulatory Visit: Payer: Self-pay | Admitting: Family Medicine

## 2019-04-13 NOTE — Telephone Encounter (Signed)
Electronic refill request. Tizanidine Last office visit:   02/25/2019 Last Filled:    30 tablet 0 03/20/2019 06/18/2019   Please advise.

## 2019-04-14 NOTE — Telephone Encounter (Signed)
Patient advised.

## 2019-04-14 NOTE — Telephone Encounter (Signed)
Sent. Thanks.  If she continues to have pain then let me know.

## 2019-04-24 ENCOUNTER — Other Ambulatory Visit: Payer: Self-pay | Admitting: Family Medicine

## 2019-04-24 NOTE — Telephone Encounter (Signed)
Electronic refill request. Diclofenac Last office visit:   02/25/2019 Acute [place] Last Filled:    60 tablet 0 03/24/2019  Please advise.

## 2019-04-26 NOTE — Telephone Encounter (Signed)
Sent. Thanks.  How she doing in terms of pain?  Please let me know.

## 2019-04-27 NOTE — Telephone Encounter (Signed)
Noted. Thanks.

## 2019-04-27 NOTE — Telephone Encounter (Signed)
Patient says she is fine and did not request this refill.  It must have been an auto refill request from the pharmacy.  Patient says her back is not hurting now and she will instruct the pharmacy not to do auto refills for her.

## 2019-05-07 ENCOUNTER — Other Ambulatory Visit: Payer: Self-pay | Admitting: Family Medicine

## 2019-05-07 NOTE — Telephone Encounter (Signed)
Since PCP is out of the office will route to Dr. Lorelei Pont who saw pt last for issue. Last OV was on 02/25/19 with Dr. Lorelei Pont for back pain, last filled on 04/14/19 #30 tabs with 0 refills

## 2019-11-24 DIAGNOSIS — Z1231 Encounter for screening mammogram for malignant neoplasm of breast: Secondary | ICD-10-CM | POA: Diagnosis not present

## 2019-11-24 DIAGNOSIS — Z23 Encounter for immunization: Secondary | ICD-10-CM | POA: Diagnosis not present

## 2019-12-09 DIAGNOSIS — Z23 Encounter for immunization: Secondary | ICD-10-CM | POA: Diagnosis not present

## 2019-12-16 ENCOUNTER — Other Ambulatory Visit: Payer: Self-pay | Admitting: Radiology

## 2019-12-16 DIAGNOSIS — R921 Mammographic calcification found on diagnostic imaging of breast: Secondary | ICD-10-CM | POA: Diagnosis not present

## 2019-12-16 DIAGNOSIS — N6011 Diffuse cystic mastopathy of right breast: Secondary | ICD-10-CM | POA: Diagnosis not present

## 2019-12-31 ENCOUNTER — Telehealth: Payer: Self-pay

## 2019-12-31 NOTE — Telephone Encounter (Signed)
Called and left voicemail for patient to return call to office regarding her mammogram results and to make a follow-up appointment for six months per Dr. Josefine Class request.

## 2020-01-01 NOTE — Telephone Encounter (Signed)
Notified patient.  She informs me that since speaking with her this am, [facility] has already scheduled her for the May MMG follow up.

## 2020-01-01 NOTE — Telephone Encounter (Signed)
Spoke with patient and gave results of benign fibrocystic changes and will need a repeat MMG in 6 mths.   Report from [facility] sent for scanning. MMG steriotactic Biopsy RBreast 12/16/2019.  Dr. Damita Dunnings,  I imagine this will be a repeat diagnostic mammogram, will you need to see patient in May to order?  I just wanted to be sure before scheduling in office appt.   Please let me know and route back to me for scheduling if appropriate.   Thanks,

## 2020-01-01 NOTE — Telephone Encounter (Signed)
I don't think she is automatically going to need another OV here in 6 months prior to imaging.  Thanks for checking on this.

## 2020-03-18 DIAGNOSIS — M7062 Trochanteric bursitis, left hip: Secondary | ICD-10-CM | POA: Diagnosis not present

## 2020-05-04 DIAGNOSIS — F419 Anxiety disorder, unspecified: Secondary | ICD-10-CM | POA: Diagnosis not present

## 2020-05-04 DIAGNOSIS — L900 Lichen sclerosus et atrophicus: Secondary | ICD-10-CM | POA: Diagnosis not present

## 2020-05-04 DIAGNOSIS — Z01419 Encounter for gynecological examination (general) (routine) without abnormal findings: Secondary | ICD-10-CM | POA: Diagnosis not present

## 2020-05-04 DIAGNOSIS — Z6828 Body mass index (BMI) 28.0-28.9, adult: Secondary | ICD-10-CM | POA: Diagnosis not present

## 2020-06-20 ENCOUNTER — Encounter: Payer: Self-pay | Admitting: Family Medicine

## 2020-06-20 DIAGNOSIS — R928 Other abnormal and inconclusive findings on diagnostic imaging of breast: Secondary | ICD-10-CM | POA: Diagnosis not present

## 2020-06-20 DIAGNOSIS — R921 Mammographic calcification found on diagnostic imaging of breast: Secondary | ICD-10-CM | POA: Diagnosis not present

## 2020-06-20 LAB — HM MAMMOGRAPHY

## 2020-06-27 ENCOUNTER — Other Ambulatory Visit: Payer: Self-pay | Admitting: Radiology

## 2020-06-27 ENCOUNTER — Encounter: Payer: Self-pay | Admitting: Family Medicine

## 2020-06-27 DIAGNOSIS — N6311 Unspecified lump in the right breast, upper outer quadrant: Secondary | ICD-10-CM | POA: Diagnosis not present

## 2020-06-27 DIAGNOSIS — N6031 Fibrosclerosis of right breast: Secondary | ICD-10-CM | POA: Diagnosis not present

## 2020-08-08 DIAGNOSIS — L723 Sebaceous cyst: Secondary | ICD-10-CM | POA: Diagnosis not present

## 2020-08-08 DIAGNOSIS — L820 Inflamed seborrheic keratosis: Secondary | ICD-10-CM | POA: Diagnosis not present

## 2020-08-08 DIAGNOSIS — L821 Other seborrheic keratosis: Secondary | ICD-10-CM | POA: Diagnosis not present

## 2020-08-08 DIAGNOSIS — L570 Actinic keratosis: Secondary | ICD-10-CM | POA: Diagnosis not present

## 2020-08-08 DIAGNOSIS — L814 Other melanin hyperpigmentation: Secondary | ICD-10-CM | POA: Diagnosis not present

## 2020-08-08 DIAGNOSIS — L578 Other skin changes due to chronic exposure to nonionizing radiation: Secondary | ICD-10-CM | POA: Diagnosis not present

## 2020-08-08 DIAGNOSIS — D1801 Hemangioma of skin and subcutaneous tissue: Secondary | ICD-10-CM | POA: Diagnosis not present

## 2020-09-05 ENCOUNTER — Ambulatory Visit (INDEPENDENT_AMBULATORY_CARE_PROVIDER_SITE_OTHER): Payer: Medicare Other | Admitting: Family Medicine

## 2020-09-05 ENCOUNTER — Other Ambulatory Visit: Payer: Self-pay

## 2020-09-05 ENCOUNTER — Encounter: Payer: Self-pay | Admitting: Family Medicine

## 2020-09-05 VITALS — BP 122/70 | HR 97 | Temp 98.0°F | Wt 151.2 lb

## 2020-09-05 DIAGNOSIS — R21 Rash and other nonspecific skin eruption: Secondary | ICD-10-CM | POA: Diagnosis not present

## 2020-09-05 MED ORDER — TRIAMCINOLONE ACETONIDE 0.5 % EX OINT: 1.0000 "application " | TOPICAL_OINTMENT | Freq: Two times a day (BID) | CUTANEOUS | 0 refills | Status: DC

## 2020-09-05 MED ORDER — HYDROXYZINE HCL 25 MG PO TABS: 25.0000 mg | ORAL_TABLET | Freq: Three times a day (TID) | ORAL | 0 refills | Status: DC | PRN

## 2020-09-05 NOTE — Progress Notes (Signed)
   Subjective:     [name] Price is a 80 y.o. female presenting for Rash (Legs, abdomen and and thoracic back area x 1 week )     Rash   #Rash - on legs after spending time in the garden - thought she was bit  - then 2 days later started having bumps everywhere - has been taking benadryl - itchy - no pain - no hx of similar rashes - no know exposures to poison ivy/oak - just saw a small black bug - sleeps with dog which is up to date on flea treatment  Review of Systems  Skin:  Positive for rash.    Social History   Tobacco Use  Smoking Status Never  Smokeless Tobacco Never        Objective:    BP Readings from Last 3 Encounters:  09/05/20 122/70  02/25/19 112/78  06/26/18 120/70   Wt Readings from Last 3 Encounters:  09/05/20 151 lb 4 oz (68.6 kg)  02/25/19 148 lb 12.8 oz (67.5 kg)  06/26/18 140 lb (63.5 kg)    BP 122/70   Pulse 97   Temp 98 F (36.7 C) (Temporal)   Wt 151 lb 4 oz (68.6 kg)   SpO2 (!) 82%   BMI 27.89 kg/m    Physical Exam Constitutional:      General: She is not in acute distress.    Appearance: She is well-developed. She is not diaphoretic.  HENT:     Right Ear: External ear normal.     Left Ear: External ear normal.  Eyes:     Conjunctiva/sclera: Conjunctivae normal.  Cardiovascular:     Rate and Rhythm: Normal rate.  Pulmonary:     Effort: Pulmonary effort is normal.  Musculoskeletal:     Cervical back: Neck supple.  Skin:    General: Skin is warm and dry.     Capillary Refill: Capillary refill takes less than 2 seconds.     Comments: Abdomen left side near pant line - scattered erythematous healing ulcers. Mid back midline near bra line with scattered erythematous healing ulcers. Legs with a few erythematous papules.   Neurological:     Mental Status: She is alert. Mental status is at baseline.  Psychiatric:        Mood and Affect: Mood normal.        Behavior: Behavior normal.          Assessment &  Plan:   Problem List Items Addressed This Visit       Musculoskeletal and Integument   Rash - Primary   Relevant Medications   hydrOXYzine (ATARAX/VISTARIL) 25 MG tablet   triamcinolone ointment (KENALOG) 0.5 %   Suspect some form of bite vs exposure in garden. Treat with triamcinolone. Discussed check dog for flea and consider bedbugs - if persisting and getting more in the morning will recommend contacting pest control to assess for presence as no other household members.   Return if symptoms worsen or fail to improve.  Lesleigh Noe, MD  This visit occurred during the SARS-CoV-2 public health emergency.  Safety protocols were in place, including screening questions prior to the visit, additional usage of staff PPE, and extensive cleaning of exam room while observing appropriate contact time as indicated for disinfecting solutions.

## 2020-09-05 NOTE — Patient Instructions (Signed)
#  Rash - try hydroxyzine if needed for itching - triamcinolone ointment twice daily to areas to help recover

## 2020-09-29 DIAGNOSIS — L249 Irritant contact dermatitis, unspecified cause: Secondary | ICD-10-CM | POA: Diagnosis not present

## 2020-10-07 ENCOUNTER — Emergency Department (HOSPITAL_COMMUNITY): Payer: Medicare Other

## 2020-10-07 ENCOUNTER — Other Ambulatory Visit: Payer: Self-pay

## 2020-10-07 ENCOUNTER — Emergency Department (HOSPITAL_COMMUNITY)
Admission: EM | Admit: 2020-10-07 | Discharge: 2020-10-07 | Disposition: A | Discharge status: Home / Self Care | Payer: Medicare Other | Source: Home / Self Care | Attending: Emergency Medicine | Admitting: Emergency Medicine

## 2020-10-07 DIAGNOSIS — R609 Edema, unspecified: Secondary | ICD-10-CM | POA: Diagnosis not present

## 2020-10-07 DIAGNOSIS — I517 Cardiomegaly: Secondary | ICD-10-CM | POA: Diagnosis not present

## 2020-10-07 DIAGNOSIS — Z96611 Presence of right artificial shoulder joint: Secondary | ICD-10-CM | POA: Insufficient documentation

## 2020-10-07 DIAGNOSIS — Z853 Personal history of malignant neoplasm of breast: Secondary | ICD-10-CM | POA: Diagnosis not present

## 2020-10-07 DIAGNOSIS — L03119 Cellulitis of unspecified part of limb: Secondary | ICD-10-CM | POA: Diagnosis not present

## 2020-10-07 DIAGNOSIS — Z96612 Presence of left artificial shoulder joint: Secondary | ICD-10-CM | POA: Insufficient documentation

## 2020-10-07 DIAGNOSIS — Z20822 Contact with and (suspected) exposure to covid-19: Secondary | ICD-10-CM | POA: Diagnosis present

## 2020-10-07 DIAGNOSIS — Z96643 Presence of artificial hip joint, bilateral: Secondary | ICD-10-CM | POA: Insufficient documentation

## 2020-10-07 DIAGNOSIS — R600 Localized edema: Secondary | ICD-10-CM | POA: Diagnosis not present

## 2020-10-07 DIAGNOSIS — L03115 Cellulitis of right lower limb: Secondary | ICD-10-CM | POA: Insufficient documentation

## 2020-10-07 DIAGNOSIS — Z8719 Personal history of other diseases of the digestive system: Secondary | ICD-10-CM | POA: Diagnosis not present

## 2020-10-07 DIAGNOSIS — Z79899 Other long term (current) drug therapy: Secondary | ICD-10-CM | POA: Diagnosis not present

## 2020-10-07 DIAGNOSIS — X58XXXA Exposure to other specified factors, initial encounter: Secondary | ICD-10-CM | POA: Diagnosis present

## 2020-10-07 DIAGNOSIS — Z96642 Presence of left artificial hip joint: Secondary | ICD-10-CM | POA: Diagnosis not present

## 2020-10-07 DIAGNOSIS — I471 Supraventricular tachycardia: Secondary | ICD-10-CM | POA: Diagnosis not present

## 2020-10-07 DIAGNOSIS — A419 Sepsis, unspecified organism: Secondary | ICD-10-CM | POA: Diagnosis not present

## 2020-10-07 DIAGNOSIS — K219 Gastro-esophageal reflux disease without esophagitis: Secondary | ICD-10-CM | POA: Diagnosis present

## 2020-10-07 DIAGNOSIS — Z8249 Family history of ischemic heart disease and other diseases of the circulatory system: Secondary | ICD-10-CM | POA: Diagnosis not present

## 2020-10-07 DIAGNOSIS — F32A Depression, unspecified: Secondary | ICD-10-CM | POA: Diagnosis present

## 2020-10-07 DIAGNOSIS — S90821A Blister (nonthermal), right foot, initial encounter: Secondary | ICD-10-CM | POA: Diagnosis present

## 2020-10-07 DIAGNOSIS — Z803 Family history of malignant neoplasm of breast: Secondary | ICD-10-CM | POA: Diagnosis not present

## 2020-10-07 DIAGNOSIS — N179 Acute kidney failure, unspecified: Secondary | ICD-10-CM | POA: Diagnosis not present

## 2020-10-07 DIAGNOSIS — R008 Other abnormalities of heart beat: Secondary | ICD-10-CM | POA: Diagnosis not present

## 2020-10-07 DIAGNOSIS — L089 Local infection of the skin and subcutaneous tissue, unspecified: Secondary | ICD-10-CM | POA: Diagnosis not present

## 2020-10-07 DIAGNOSIS — R000 Tachycardia, unspecified: Secondary | ICD-10-CM | POA: Diagnosis not present

## 2020-10-07 DIAGNOSIS — R918 Other nonspecific abnormal finding of lung field: Secondary | ICD-10-CM | POA: Diagnosis not present

## 2020-10-07 DIAGNOSIS — Z961 Presence of intraocular lens: Secondary | ICD-10-CM | POA: Diagnosis present

## 2020-10-07 DIAGNOSIS — E785 Hyperlipidemia, unspecified: Secondary | ICD-10-CM | POA: Diagnosis present

## 2020-10-07 DIAGNOSIS — Z9841 Cataract extraction status, right eye: Secondary | ICD-10-CM | POA: Diagnosis not present

## 2020-10-07 DIAGNOSIS — R0902 Hypoxemia: Secondary | ICD-10-CM | POA: Diagnosis not present

## 2020-10-07 DIAGNOSIS — Z800 Family history of malignant neoplasm of digestive organs: Secondary | ICD-10-CM | POA: Diagnosis not present

## 2020-10-07 DIAGNOSIS — M810 Age-related osteoporosis without current pathological fracture: Secondary | ICD-10-CM | POA: Diagnosis present

## 2020-10-07 DIAGNOSIS — I10 Essential (primary) hypertension: Secondary | ICD-10-CM | POA: Diagnosis not present

## 2020-10-07 DIAGNOSIS — M7989 Other specified soft tissue disorders: Secondary | ICD-10-CM | POA: Diagnosis not present

## 2020-10-07 DIAGNOSIS — M79604 Pain in right leg: Secondary | ICD-10-CM | POA: Diagnosis not present

## 2020-10-07 DIAGNOSIS — R197 Diarrhea, unspecified: Secondary | ICD-10-CM | POA: Diagnosis not present

## 2020-10-07 DIAGNOSIS — Z9842 Cataract extraction status, left eye: Secondary | ICD-10-CM | POA: Diagnosis not present

## 2020-10-07 DIAGNOSIS — I959 Hypotension, unspecified: Secondary | ICD-10-CM | POA: Diagnosis not present

## 2020-10-07 DIAGNOSIS — F419 Anxiety disorder, unspecified: Secondary | ICD-10-CM | POA: Diagnosis present

## 2020-10-07 DIAGNOSIS — E876 Hypokalemia: Secondary | ICD-10-CM | POA: Diagnosis not present

## 2020-10-07 LAB — CBC WITH DIFFERENTIAL/PLATELET
Abs Immature Granulocytes: 0.33 10*3/uL — ABNORMAL HIGH (ref 0.00–0.07)
Basophils Absolute: 0 10*3/uL (ref 0.0–0.1)
Basophils Relative: 0 %
Eosinophils Absolute: 0 10*3/uL (ref 0.0–0.5)
Eosinophils Relative: 0 %
HCT: 42.7 % (ref 36.0–46.0)
Hemoglobin: 14.2 g/dL (ref 12.0–15.0)
Immature Granulocytes: 2 %
Lymphocytes Relative: 3 %
Lymphs Abs: 0.5 10*3/uL — ABNORMAL LOW (ref 0.7–4.0)
MCH: 31.8 pg (ref 26.0–34.0)
MCHC: 33.3 g/dL (ref 30.0–36.0)
MCV: 95.5 fL (ref 80.0–100.0)
Monocytes Absolute: 1.1 10*3/uL — ABNORMAL HIGH (ref 0.1–1.0)
Monocytes Relative: 7 %
Neutro Abs: 14.9 10*3/uL — ABNORMAL HIGH (ref 1.7–7.7)
Neutrophils Relative %: 88 %
Platelets: 172 10*3/uL (ref 150–400)
RBC: 4.47 MIL/uL (ref 3.87–5.11)
RDW: 13.3 % (ref 11.5–15.5)
WBC: 16.9 10*3/uL — ABNORMAL HIGH (ref 4.0–10.5)
nRBC: 0 % (ref 0.0–0.2)

## 2020-10-07 LAB — BASIC METABOLIC PANEL
Anion gap: 9 (ref 5–15)
BUN: 20 mg/dL (ref 8–23)
CO2: 23 mmol/L (ref 22–32)
Calcium: 8.8 mg/dL — ABNORMAL LOW (ref 8.9–10.3)
Chloride: 101 mmol/L (ref 98–111)
Creatinine, Ser: 0.86 mg/dL (ref 0.44–1.00)
GFR, Estimated: >60 mL/min (ref >60)
Glucose, Bld: 126 mg/dL — ABNORMAL HIGH (ref 70–99)
Potassium: 3.6 mmol/L (ref 3.5–5.1)
Sodium: 133 mmol/L — ABNORMAL LOW (ref 135–145)

## 2020-10-07 MED ORDER — CEPHALEXIN 500 MG PO CAPS
500.0000 mg | ORAL_CAPSULE | Freq: Once | ORAL | Status: AC
  Administered 2020-10-07: 500 mg via ORAL
  Filled 2020-10-07: qty 1

## 2020-10-07 MED ORDER — CEPHALEXIN 250 MG PO CAPS: 250.0000 mg | ORAL_CAPSULE | Freq: Four times a day (QID) | ORAL | 0 refills | Status: DC

## 2020-10-07 NOTE — ED Provider Notes (Signed)
[facility] EMERGENCY DEPARTMENT Provider Note   CSN: [id number] Arrival date & time: 10/07/20  [id number]     History Chief Complaint  Patient presents with   Foot Pain    [name] Price is a 80 y.o. female.   Foot Pain Pertinent negatives include no chest pain, no abdominal pain and no shortness of breath. Patient presents with pain and swelling in her right foot and lower leg.  Began yesterday.  States she woke up with it. Redness in the foot.  No injury.  No fevers or chills.  States she feels fine besides foot.  Has not had episodes like this before.  Able to walk on it but is some pain.     Past Medical History:  Diagnosis Date   Allergy    Anxiety    Arthritis    Blood transfusion without reported diagnosis    as baby   Breast cancer ([place]) 1999-2000   s/p lumpectomy and radiation, no chemo-right   History of chicken pox    History of hip replacement    Hot flashes    treated wtih paxil   Hyperlipidemia    Osteoporosis    DXA 11/12/17   UTI (urinary tract infection)     Patient Active Problem List   Diagnosis Date Noted   Rash 09/05/2020   History of hip replacement    Advance care planning 06/25/2017   Health care maintenance 06/25/2017   Hot flashes 06/25/2017   BPV (benign positional vertigo) 03/15/2015   Ganglion cyst of wrist 02/23/2015   Extensor tendon rupture of hand 02/01/2015   Left wrist effusion 01/26/2015   KNEE PAIN, RIGHT 11/13/2007   CHONDROMALACIA PATELLA, LEFT 03/19/2007   Osteoporosis 03/19/2007   HEMATURIA, HX OF 03/19/2007   DIVERTICULOSIS, COLON 08/10/2002   HYPERCHOLESTEROLEMIA 06/05/1996    Past Surgical History:  Procedure Laterality Date   BREAST SURGERY Right    CATARACT EXTRACTION W/PHACO Left 12/28/2014   Procedure: CATARACT EXTRACTION PHACO AND INTRAOCULAR LENS PLACEMENT ([place]);  Surgeon: Birder Robson, MD;  Location: [facility] ORS;  Service: Ophthalmology;  Laterality: Left;  [place] 00:38 AP% 19.9 CDE 7.70 fluid pack lot #  [id number]   CATARACT EXTRACTION W/PHACO Right 07/10/2016   Procedure: CATARACT EXTRACTION PHACO AND INTRAOCULAR LENS PLACEMENT ([facility]);  Surgeon: Birder Robson, MD;  Location: [facility] ORS;  Service: Ophthalmology;  Laterality: Right;  [place] 00:38 AP% 18.3 CDE 7.06 Fluid pack lot # [id number]   COLONOSCOPY  2016   EYE SURGERY     JOINT REPLACEMENT Bilateral    hip  / shoulder   OOPHORECTOMY Left    POLYPECTOMY     TOTAL HIP ARTHROPLASTY Bilateral    (2) Two   TOTAL SHOULDER REPLACEMENT       OB History   No obstetric history on file.     Family History  Problem Relation Age of Onset   Lung cancer Mother    Heart disease Mother    Colon cancer Mother        late 49's   Cancer Father    Colon cancer Father 31       died at 38   Breast cancer Maternal Aunt    Breast cancer Maternal Aunt    Breast cancer Maternal Aunt    Rectal cancer Neg Hx    Stomach cancer Neg Hx    Crohn's disease Neg Hx    Esophageal cancer Neg Hx     Social History   Tobacco Use  Smoking status: Never   Smokeless tobacco: Never  Vaping Use   Vaping Use: Never used  Substance Use Topics   Alcohol use: No    Comment: rare once a year   Drug use: No    Home Medications Prior to Admission medications   Medication Sig Start Date End Date Taking? Authorizing Provider  cephALEXin (KEFLEX) 250 MG capsule Take 1 capsule (250 mg total) by mouth 4 (four) times daily. 10/07/20  Yes Davonna Belling, MD  calcium carbonate (OS-CAL) 600 MG TABS Take 600 mg by mouth 2 (two) times daily with a meal.    [provider]  Cholecalciferol (VITAMIN D) 2000 units tablet Take 2,000 Units by mouth daily.    [provider]  Cyanocobalamin 1000 MCG TBCR Take 1,000 mcg by mouth daily.    [provider]  hydrOXYzine (ATARAX/VISTARIL) 25 MG tablet Take 1 tablet (25 mg total) by mouth 3 (three) times daily as needed for itching. 09/05/20   Lesleigh Noe, MD  PARoxetine (PAXIL-CR) 25 MG 24 hr  tablet Take 25 mg by mouth daily.    [provider]  triamcinolone ointment (KENALOG) 0.5 % Apply 1 application topically 2 (two) times daily. 09/05/20   Lesleigh Noe, MD    Allergies    Codeine  Review of Systems   Review of Systems  Constitutional:  Negative for appetite change and fever.  HENT:  Negative for congestion.   Respiratory:  Negative for shortness of breath.   Cardiovascular:  Negative for chest pain.  Gastrointestinal:  Negative for abdominal pain.  Genitourinary:  Negative for flank pain.  Musculoskeletal:        Right foot pain and swelling.  Skin:  Positive for color change.  Neurological:  Negative for weakness.  Psychiatric/Behavioral:  Negative for confusion.    Physical Exam Updated Vital Signs BP (!) 112/50   Pulse 90   Temp 98.2 F (36.8 C) (Oral)   Resp 16   Ht '5\' 3"'$  (1.6 m)   Wt 65.8 kg   SpO2 99%   BMI 25.69 kg/m   Physical Exam Vitals and nursing note reviewed.  HENT:     Head: Atraumatic.  Eyes:     General: No scleral icterus. Cardiovascular:     Rate and Rhythm: Regular rhythm.  Abdominal:     Tenderness: There is no abdominal tenderness.  Musculoskeletal:     Cervical back: Neck supple.     Right lower leg: Edema present.     Comments: Edema from right mid lower leg down.  Some erythema particularly near the toes.  Tenderness over the edema.  No pain with axial loading of the toes.  Skin:    General: Skin is warm.     Capillary Refill: Capillary refill takes less than 2 seconds.     Findings: Erythema present.     Comments: For flash color change particularly on the distal foot near the third fourth and fifth toes  Neurological:     Mental Status: She is alert and oriented to person, place, and time.  Psychiatric:        Mood and Affect: Mood normal.         ED Results / Procedures / Treatments   Labs (all labs ordered are listed, but only abnormal results are displayed) Labs Reviewed  CBC WITH  DIFFERENTIAL/PLATELET - Abnormal; Notable for the following components:      Result Value   WBC 16.9 (*)    Neutro  Abs 14.9 (*)    Lymphs Abs 0.5 (*)    Monocytes Absolute 1.1 (*)    Abs Immature Granulocytes 0.33 (*)    All other components within normal limits  BASIC METABOLIC PANEL - Abnormal; Notable for the following components:   Sodium 133 (*)    Glucose, Bld 126 (*)    Calcium 8.8 (*)    All other components within normal limits    EKG None  Radiology US Venous Img Lower Unilateral Right  Result Date: 10/07/2020 CLINICAL DATA:  Right lower extremity pain and edema. History of breast cancer. Evaluate for DVT. EXAM: RIGHT LOWER EXTREMITY VENOUS DOPPLER ULTRASOUND TECHNIQUE: Gray-scale sonography with graded compression, as well as color Doppler and duplex ultrasound were performed to evaluate the lower extremity deep venous systems from the level of the common femoral vein and including the common femoral, femoral, profunda femoral, popliteal and calf veins including the posterior tibial, peroneal and gastrocnemius veins when visible. The superficial great saphenous vein was also interrogated. Spectral Doppler was utilized to evaluate flow at rest and with distal augmentation maneuvers in the common femoral, femoral and popliteal veins. COMPARISON:  None. FINDINGS: Contralateral Common Femoral Vein: Respiratory phasicity is normal and symmetric with the symptomatic side. No evidence of thrombus. Normal compressibility. Common Femoral Vein: No evidence of thrombus. Normal compressibility, respiratory phasicity and response to augmentation. Saphenofemoral Junction: No evidence of thrombus. Normal compressibility and flow on color Doppler imaging. Profunda Femoral Vein: No evidence of thrombus. Normal compressibility and flow on color Doppler imaging. Femoral Vein: No evidence of thrombus. Normal compressibility, respiratory phasicity and response to augmentation. Popliteal Vein: No evidence  of thrombus. Normal compressibility, respiratory phasicity and response to augmentation. Calf Veins: No evidence of thrombus. Normal compressibility and flow on color Doppler imaging. Superficial Great Saphenous Vein: No evidence of thrombus. Normal compressibility. Venous Reflux:  None. Other Findings:  None. IMPRESSION: No evidence of DVT within the right lower extremity. Electronically Signed   By: Sandi Mariscal M.D.   On: 10/07/2020 09:33   DG Foot Complete Right  Result Date: 10/07/2020 CLINICAL DATA:  80 year old female with pain and swelling in the right foot but no known injury. EXAM: RIGHT FOOT COMPLETE - 3+ VIEW COMPARISON:  None. FINDINGS: Three portable views. Bone mineralization is within normal limits. Calcaneus intact. Tarsal bones appear intact and normally aligned. Distal joint spaces appear normal for age. No acute osseous abnormality identified. Dorsal soft tissue swelling. No soft tissue gas. No radiopaque foreign body identified. IMPRESSION: Soft tissue swelling with no acute osseous abnormality identified. Electronically Signed   By: Genevie Ann M.D.   On: 10/07/2020 09:15    Procedures Procedures   Medications Ordered in ED Medications  cephALEXin (KEFLEX) capsule 500 mg (500 mg Oral Given 10/07/20 [id number])    ED Course  I have reviewed the triage vital signs and the nursing notes.  Pertinent labs & imaging results that were available during my care of the patient were reviewed by me and considered in my medical decision making (see chart for details).    MDM Rules/Calculators/A&P                          Patient with redness and swelling right lower leg.  White count mildly elevated.  X-rays reassuring.  Negative Doppler.  Likely a cellulitis.  There was a small abrasion on the right lateral ankle and potentially could have been a source though there is more  skin changes near the toes.  Well-appearing.  Does not feel systemically ill.  Will treat with Keflex.  Outpatient  follow-up in the next couple days.  Final Clinical Impression(s) / ED Diagnoses Final diagnoses:  Cellulitis of foot    Rx / DC Orders ED Discharge Orders          Ordered    cephALEXin (KEFLEX) 250 MG capsule  4 times daily        10/07/20 0955             Davonna Belling, MD 10/07/20 [phone]

## 2020-10-07 NOTE — ED Triage Notes (Signed)
Pt took alleve this morning prior to arrival

## 2020-10-07 NOTE — ED Notes (Signed)
ED Provider at bedside. 

## 2020-10-07 NOTE — ED Triage Notes (Signed)
Pt states beginning yesterday noticed redness swelling top of right foot, pain with wt bearing, denies any injury to area.  Able to ambulate with steady gait.  Denies fever

## 2020-10-08 ENCOUNTER — Encounter (HOSPITAL_COMMUNITY): Payer: Self-pay

## 2020-10-08 ENCOUNTER — Other Ambulatory Visit: Payer: Self-pay

## 2020-10-08 ENCOUNTER — Emergency Department (HOSPITAL_COMMUNITY): Payer: Medicare Other

## 2020-10-08 ENCOUNTER — Inpatient Hospital Stay (HOSPITAL_COMMUNITY)
Admission: EM | Admit: 2020-10-08 | Discharge: 2020-10-19 | DRG: 872 | Disposition: A | Discharge status: Home Health | Payer: Medicare Other | Attending: Internal Medicine | Admitting: Internal Medicine

## 2020-10-08 DIAGNOSIS — Z885 Allergy status to narcotic agent status: Secondary | ICD-10-CM

## 2020-10-08 DIAGNOSIS — N179 Acute kidney failure, unspecified: Secondary | ICD-10-CM

## 2020-10-08 DIAGNOSIS — R197 Diarrhea, unspecified: Secondary | ICD-10-CM

## 2020-10-08 DIAGNOSIS — Z8249 Family history of ischemic heart disease and other diseases of the circulatory system: Secondary | ICD-10-CM

## 2020-10-08 DIAGNOSIS — X58XXXA Exposure to other specified factors, initial encounter: Secondary | ICD-10-CM | POA: Diagnosis present

## 2020-10-08 DIAGNOSIS — M810 Age-related osteoporosis without current pathological fracture: Secondary | ICD-10-CM | POA: Diagnosis present

## 2020-10-08 DIAGNOSIS — Z9841 Cataract extraction status, right eye: Secondary | ICD-10-CM | POA: Diagnosis not present

## 2020-10-08 DIAGNOSIS — M7989 Other specified soft tissue disorders: Secondary | ICD-10-CM | POA: Diagnosis not present

## 2020-10-08 DIAGNOSIS — Z96643 Presence of artificial hip joint, bilateral: Secondary | ICD-10-CM | POA: Diagnosis present

## 2020-10-08 DIAGNOSIS — Z961 Presence of intraocular lens: Secondary | ICD-10-CM | POA: Diagnosis present

## 2020-10-08 DIAGNOSIS — Z853 Personal history of malignant neoplasm of breast: Secondary | ICD-10-CM

## 2020-10-08 DIAGNOSIS — D649 Anemia, unspecified: Secondary | ICD-10-CM | POA: Diagnosis not present

## 2020-10-08 DIAGNOSIS — K219 Gastro-esophageal reflux disease without esophagitis: Secondary | ICD-10-CM | POA: Diagnosis present

## 2020-10-08 DIAGNOSIS — R000 Tachycardia, unspecified: Secondary | ICD-10-CM | POA: Diagnosis not present

## 2020-10-08 DIAGNOSIS — Z800 Family history of malignant neoplasm of digestive organs: Secondary | ICD-10-CM

## 2020-10-08 DIAGNOSIS — E876 Hypokalemia: Secondary | ICD-10-CM | POA: Diagnosis present

## 2020-10-08 DIAGNOSIS — R609 Edema, unspecified: Secondary | ICD-10-CM | POA: Diagnosis not present

## 2020-10-08 DIAGNOSIS — F419 Anxiety disorder, unspecified: Secondary | ICD-10-CM | POA: Diagnosis present

## 2020-10-08 DIAGNOSIS — I517 Cardiomegaly: Secondary | ICD-10-CM | POA: Diagnosis not present

## 2020-10-08 DIAGNOSIS — A419 Sepsis, unspecified organism: Secondary | ICD-10-CM | POA: Diagnosis present

## 2020-10-08 DIAGNOSIS — Z96642 Presence of left artificial hip joint: Secondary | ICD-10-CM | POA: Diagnosis not present

## 2020-10-08 DIAGNOSIS — E785 Hyperlipidemia, unspecified: Secondary | ICD-10-CM | POA: Diagnosis present

## 2020-10-08 DIAGNOSIS — I10 Essential (primary) hypertension: Secondary | ICD-10-CM | POA: Diagnosis not present

## 2020-10-08 DIAGNOSIS — I493 Ventricular premature depolarization: Secondary | ICD-10-CM | POA: Diagnosis not present

## 2020-10-08 DIAGNOSIS — Z803 Family history of malignant neoplasm of breast: Secondary | ICD-10-CM | POA: Diagnosis not present

## 2020-10-08 DIAGNOSIS — F32A Depression, unspecified: Secondary | ICD-10-CM | POA: Diagnosis present

## 2020-10-08 DIAGNOSIS — L03115 Cellulitis of right lower limb: Secondary | ICD-10-CM

## 2020-10-08 DIAGNOSIS — R918 Other nonspecific abnormal finding of lung field: Secondary | ICD-10-CM | POA: Diagnosis not present

## 2020-10-08 DIAGNOSIS — Z79899 Other long term (current) drug therapy: Secondary | ICD-10-CM

## 2020-10-08 DIAGNOSIS — S90821A Blister (nonthermal), right foot, initial encounter: Secondary | ICD-10-CM | POA: Diagnosis present

## 2020-10-08 DIAGNOSIS — I471 Supraventricular tachycardia: Secondary | ICD-10-CM | POA: Diagnosis not present

## 2020-10-08 DIAGNOSIS — L089 Local infection of the skin and subcutaneous tissue, unspecified: Secondary | ICD-10-CM | POA: Diagnosis not present

## 2020-10-08 DIAGNOSIS — R0902 Hypoxemia: Secondary | ICD-10-CM | POA: Diagnosis not present

## 2020-10-08 DIAGNOSIS — Z8719 Personal history of other diseases of the digestive system: Secondary | ICD-10-CM | POA: Diagnosis not present

## 2020-10-08 DIAGNOSIS — Z9842 Cataract extraction status, left eye: Secondary | ICD-10-CM | POA: Diagnosis not present

## 2020-10-08 DIAGNOSIS — R008 Other abnormalities of heart beat: Secondary | ICD-10-CM | POA: Diagnosis not present

## 2020-10-08 DIAGNOSIS — L03119 Cellulitis of unspecified part of limb: Secondary | ICD-10-CM | POA: Diagnosis not present

## 2020-10-08 DIAGNOSIS — I959 Hypotension, unspecified: Secondary | ICD-10-CM | POA: Diagnosis not present

## 2020-10-08 DIAGNOSIS — Z20822 Contact with and (suspected) exposure to covid-19: Secondary | ICD-10-CM | POA: Diagnosis present

## 2020-10-08 LAB — CBC WITH DIFFERENTIAL/PLATELET
Abs Immature Granulocytes: 0.6 10*3/uL — ABNORMAL HIGH (ref 0.00–0.07)
Band Neutrophils: 2 %
Basophils Absolute: 0 10*3/uL (ref 0.0–0.1)
Basophils Relative: 0 %
Eosinophils Absolute: 0 10*3/uL (ref 0.0–0.5)
Eosinophils Relative: 0 %
HCT: 40.3 % (ref 36.0–46.0)
Hemoglobin: 13.6 g/dL (ref 12.0–15.0)
Lymphocytes Relative: 6 %
Lymphs Abs: 0.6 10*3/uL — ABNORMAL LOW (ref 0.7–4.0)
MCH: 31.3 pg (ref 26.0–34.0)
MCHC: 33.7 g/dL (ref 30.0–36.0)
MCV: 92.6 fL (ref 80.0–100.0)
Metamyelocytes Relative: 4 %
Monocytes Absolute: 0.3 10*3/uL (ref 0.1–1.0)
Monocytes Relative: 3 %
Myelocytes: 2 %
Neutro Abs: 8.4 10*3/uL — ABNORMAL HIGH (ref 1.7–7.7)
Neutrophils Relative %: 83 %
Platelets: 166 10*3/uL (ref 150–400)
RBC: 4.35 MIL/uL (ref 3.87–5.11)
RDW: 13.2 % (ref 11.5–15.5)
WBC: 9.9 10*3/uL (ref 4.0–10.5)
nRBC: 0 % (ref 0.0–0.2)
nRBC: 0 /100 WBC

## 2020-10-08 LAB — BASIC METABOLIC PANEL
Anion gap: 13 (ref 5–15)
BUN: 31 mg/dL — ABNORMAL HIGH (ref 8–23)
CO2: 20 mmol/L — ABNORMAL LOW (ref 22–32)
Calcium: 8.7 mg/dL — ABNORMAL LOW (ref 8.9–10.3)
Chloride: 103 mmol/L (ref 98–111)
Creatinine, Ser: 1.48 mg/dL — ABNORMAL HIGH (ref 0.44–1.00)
GFR, Estimated: 36 mL/min — ABNORMAL LOW (ref >60)
Glucose, Bld: 113 mg/dL — ABNORMAL HIGH (ref 70–99)
Potassium: 3.6 mmol/L (ref 3.5–5.1)
Sodium: 136 mmol/L (ref 135–145)

## 2020-10-08 LAB — RESP PANEL BY RT-PCR (FLU A&B, COVID) ARPGX2
Influenza A by PCR: NEGATIVE
Influenza B by PCR: NEGATIVE
SARS Coronavirus 2 by RT PCR: NEGATIVE

## 2020-10-08 LAB — LACTIC ACID, PLASMA
Lactic Acid, Venous: 4.7 mmol/L (ref 0.5–1.9)
Lactic Acid, Venous: 3 mmol/L (ref 0.5–1.9)

## 2020-10-08 LAB — C DIFFICILE QUICK SCREEN W PCR REFLEX
C Diff antigen: NEGATIVE
C Diff interpretation: NOT DETECTED
C Diff toxin: NEGATIVE

## 2020-10-08 MED ORDER — HYDROXYZINE HCL 25 MG PO TABS
25.0000 mg | ORAL_TABLET | Freq: Three times a day (TID) | ORAL | Status: DC | PRN
  Administered 2020-10-09: 25 mg via ORAL
  Filled 2020-10-08: qty 1

## 2020-10-08 MED ORDER — MICONAZOLE NITRATE 2 % EX CREA
TOPICAL_CREAM | Freq: Two times a day (BID) | CUTANEOUS | Status: DC
  Filled 2020-10-08 (×2): qty 28.4

## 2020-10-08 MED ORDER — ONDANSETRON HCL 4 MG/2ML IJ SOLN
4.0000 mg | Freq: Once | INTRAMUSCULAR | Status: AC
  Administered 2020-10-08: 4 mg via INTRAVENOUS
  Filled 2020-10-08: qty 2

## 2020-10-08 MED ORDER — RISAQUAD PO CAPS
1.0000 | ORAL_CAPSULE | Freq: Every day | ORAL | Status: DC
  Administered 2020-10-09 – 2020-10-19 (×11): 1 via ORAL
  Filled 2020-10-08 (×12): qty 1

## 2020-10-08 MED ORDER — MORPHINE SULFATE (PF) 4 MG/ML IV SOLN
4.0000 mg | Freq: Once | INTRAVENOUS | Status: AC
Start: 2020-10-08 — End: 2020-10-08
  Administered 2020-10-08: 4 mg via INTRAVENOUS
  Filled 2020-10-08: qty 1

## 2020-10-08 MED ORDER — MELATONIN 3 MG PO TABS
3.0000 mg | ORAL_TABLET | Freq: Every evening | ORAL | Status: DC | PRN
  Administered 2020-10-08 – 2020-10-11 (×4): 3 mg via ORAL
  Filled 2020-10-08 (×6): qty 1

## 2020-10-08 MED ORDER — PAROXETINE HCL ER 25 MG PO TB24
25.0000 mg | ORAL_TABLET | Freq: Every day | ORAL | Status: DC
  Filled 2020-10-08: qty 1

## 2020-10-08 MED ORDER — HEPARIN SODIUM (PORCINE) 5000 UNIT/ML IJ SOLN
5000.0000 [IU] | Freq: Two times a day (BID) | INTRAMUSCULAR | Status: DC
  Administered 2020-10-08 – 2020-10-19 (×22): 5000 [IU] via SUBCUTANEOUS
  Filled 2020-10-08 (×22): qty 1

## 2020-10-08 MED ORDER — PAROXETINE HCL ER 12.5 MG PO TB24
25.0000 mg | ORAL_TABLET | Freq: Every day | ORAL | Status: DC
  Administered 2020-10-09 – 2020-10-19 (×11): 25 mg via ORAL
  Filled 2020-10-08 (×12): qty 2

## 2020-10-08 MED ORDER — ACETAMINOPHEN 325 MG PO TABS
650.0000 mg | ORAL_TABLET | Freq: Four times a day (QID) | ORAL | Status: DC | PRN
  Administered 2020-10-08 – 2020-10-19 (×10): 650 mg via ORAL
  Filled 2020-10-08 (×11): qty 2

## 2020-10-08 MED ORDER — FENTANYL CITRATE PF 50 MCG/ML IJ SOSY
25.0000 ug | PREFILLED_SYRINGE | INTRAMUSCULAR | Status: DC | PRN
  Administered 2020-10-08: 25 ug via INTRAVENOUS
  Filled 2020-10-08: qty 1

## 2020-10-08 MED ORDER — LACTATED RINGERS IV SOLN: INTRAVENOUS | Status: AC

## 2020-10-08 MED ORDER — LACTATED RINGERS IV BOLUS (SEPSIS)
1000.0000 mL | Freq: Once | INTRAVENOUS | Status: AC
  Administered 2020-10-08: 1000 mL via INTRAVENOUS

## 2020-10-08 MED ORDER — BACID PO TABS
2.0000 | ORAL_TABLET | Freq: Three times a day (TID) | ORAL | Status: DC
  Filled 2020-10-08: qty 2

## 2020-10-08 MED ORDER — ENOXAPARIN SODIUM 40 MG/0.4ML IJ SOSY: 40.0000 mg | PREFILLED_SYRINGE | INTRAMUSCULAR | Status: DC

## 2020-10-08 MED ORDER — SODIUM CHLORIDE 0.9 % IV SOLN
2.0000 g | Freq: Once | INTRAVENOUS | Status: AC
  Administered 2020-10-08: 2 g via INTRAVENOUS
  Filled 2020-10-08: qty 20

## 2020-10-08 MED ORDER — VANCOMYCIN VARIABLE DOSE PER UNSTABLE RENAL FUNCTION (PHARMACIST DOSING): Status: DC

## 2020-10-08 MED ORDER — VANCOMYCIN HCL 1500 MG/300ML IV SOLN
1500.0000 mg | Freq: Once | INTRAVENOUS | Status: AC
  Administered 2020-10-08: 1500 mg via INTRAVENOUS
  Filled 2020-10-08: qty 300

## 2020-10-08 MED ORDER — LACTATED RINGERS IV BOLUS (SEPSIS)
250.0000 mL | Freq: Once | INTRAVENOUS | Status: AC
  Administered 2020-10-08: 250 mL via INTRAVENOUS

## 2020-10-08 MED ORDER — CALCIUM CARBONATE 1250 (500 CA) MG PO TABS
1.0000 | ORAL_TABLET | Freq: Two times a day (BID) | ORAL | Status: DC
  Administered 2020-10-09 – 2020-10-19 (×21): 500 mg via ORAL
  Filled 2020-10-08 (×22): qty 1

## 2020-10-08 MED ORDER — ACETAMINOPHEN 650 MG RE SUPP: 650.0000 mg | Freq: Four times a day (QID) | RECTAL | Status: DC | PRN

## 2020-10-08 MED ORDER — CALCIUM CARBONATE 600 MG PO TABS: 600.0000 mg | ORAL_TABLET | Freq: Two times a day (BID) | ORAL | Status: DC

## 2020-10-08 MED ORDER — DOXYCYCLINE HYCLATE 100 MG PO TABS
100.0000 mg | ORAL_TABLET | Freq: Two times a day (BID) | ORAL | Status: DC
  Administered 2020-10-08 – 2020-10-11 (×6): 100 mg via ORAL
  Filled 2020-10-08 (×6): qty 1

## 2020-10-08 NOTE — ED Triage Notes (Signed)
Patient arrived by [facility] from home with recheck of right foot cellulitis. Seen in ED yesterday and prescribed antibiotics, now having diarrhea

## 2020-10-08 NOTE — Progress Notes (Signed)
Pharmacy Antibiotic Note  [name] Price is a 80 y.o. female admitted on 10/08/2020 with pain and swelling in her right foot and lower leg.  Pharmacy has been consulted for vancomycin dosing.  Patient was seen in ED 8/26 for same - started on Keflex at that time. Today with WBC of 9.9 (down from 16.9 on 8/26), but with LA of 4.7; HR 114; RR 20. Additionally, patient's serum creatinine is 1.48 which is significantly higher than on 8/26.  Plan: Rocephin per MD Vancomycin 1500 mg once, subsequent dosing as indicated per random vancomycin level until renal function stable and/or improved, at which time scheduled dosing can be considered Follow-up cultures and de-escalate antibiotics as appropriate.     Temp (24hrs), Avg:97.5 F (36.4 C), Min:97.5 F (36.4 C), Max:97.5 F (36.4 C)  Recent Labs  Lab 10/07/20 0834 10/08/20 1110  WBC 16.9* 9.9  CREATININE 0.86 1.48*  LATICACIDVEN  --  4.7*    Estimated Creatinine Clearance: 29 mL/min (A) (by C-G formula based on SCr of 1.48 mg/dL (H)).    Allergies  Allergen Reactions   Codeine Nausea And Vomiting   Antimicrobials this admission: Cephalexin started 8/26 prior to arrival  ceftriaxone 8/27 >>  vancomycin 8/27 >>   Microbiology results: Pending  Thank you for allowing pharmacy to be a part of this patient's care.  Lorelei Pont, PharmD, BCPS 10/08/2020 2:05 PM ED Clinical Pharmacist -  [phone]

## 2020-10-08 NOTE — ED Provider Notes (Signed)
[facility] EMERGENCY DEPARTMENT Provider Note   CSN: [id number] Arrival date & time: 10/08/20  1100     History CC:  Diarrhea, weakness, infection   [name] Price is a 80 y.o. female present emerge apartment worsening right foot pain and fatigue and diarrhea.  Patient was seen in the ED yesterday and diagnosed with cellulitis or infection of the right foot.  She had an x-ray which was largely unremarkable and a negative DVT Doppler ultrasound.  She was started on Keflex and sent home.  She reports after beginning antibiotic she began having diarrhea last night.  She reports profuse, watery diarrhea, occurring "every time I turned over".  She says she feels extremely fatigued and dehydrated this morning.  She had no energy to walk.  She also feels like the redness and swelling is worsened around the right foot.  She denies fevers or chills.  She denies cough or congestion.  She is here with her husband.  She denies any history of C. difficile.  She denies any other recent antibiotics in the past several months.  She is not diabetic.  HPI     Past Medical History:  Diagnosis Date   Allergy    Anxiety    Arthritis    Blood transfusion without reported diagnosis    as baby   Breast cancer ([place]) 1999-2000   s/p lumpectomy and radiation, no chemo-right   History of chicken pox    History of hip replacement    Hot flashes    treated wtih paxil   Hyperlipidemia    Osteoporosis    DXA 11/12/17   UTI (urinary tract infection)     Patient Active Problem List   Diagnosis Date Noted   AKI (acute kidney injury) ([place]) 10/08/2020   Sepsis ([place])    Diarrhea    Rash 09/05/2020   History of hip replacement    Advance care planning 06/25/2017   Health care maintenance 06/25/2017   Hot flashes 06/25/2017   BPV (benign positional vertigo) 03/15/2015   Ganglion cyst of wrist 02/23/2015   Extensor tendon rupture of hand 02/01/2015   Left wrist effusion 01/26/2015    KNEE PAIN, RIGHT 11/13/2007   CHONDROMALACIA PATELLA, LEFT 03/19/2007   Osteoporosis 03/19/2007   HEMATURIA, HX OF 03/19/2007   DIVERTICULOSIS, COLON 08/10/2002   HYPERCHOLESTEROLEMIA 06/05/1996    Past Surgical History:  Procedure Laterality Date   BREAST SURGERY Right    CATARACT EXTRACTION W/PHACO Left 12/28/2014   Procedure: CATARACT EXTRACTION PHACO AND INTRAOCULAR LENS PLACEMENT ([place]);  Surgeon: Birder Robson, MD;  Location: [facility] ORS;  Service: Ophthalmology;  Laterality: Left;  [place] 00:38 AP% 19.9 CDE 7.70 fluid pack lot # [id number]   CATARACT EXTRACTION W/PHACO Right 07/10/2016   Procedure: CATARACT EXTRACTION PHACO AND INTRAOCULAR LENS PLACEMENT ([facility]);  Surgeon: Birder Robson, MD;  Location: [facility] ORS;  Service: Ophthalmology;  Laterality: Right;  [place] 00:38 AP% 18.3 CDE 7.06 Fluid pack lot # [id number]   COLONOSCOPY  2016   EYE SURGERY     JOINT REPLACEMENT Bilateral    hip  / shoulder   OOPHORECTOMY Left    POLYPECTOMY     TOTAL HIP ARTHROPLASTY Bilateral    (2) Two   TOTAL SHOULDER REPLACEMENT       OB History   No obstetric history on file.     Family History  Problem Relation Age of Onset   Lung cancer Mother    Heart disease Mother    Colon  cancer Mother        late 48's   Cancer Father    Colon cancer Father 50       died at 47   Breast cancer Maternal Aunt    Breast cancer Maternal Aunt    Breast cancer Maternal Aunt    Rectal cancer Neg Hx    Stomach cancer Neg Hx    Crohn's disease Neg Hx    Esophageal cancer Neg Hx     Social History   Tobacco Use   Smoking status: Never   Smokeless tobacco: Never  Vaping Use   Vaping Use: Never used  Substance Use Topics   Alcohol use: No    Comment: rare once a year   Drug use: No    Home Medications Prior to Admission medications   Medication Sig Start Date End Date Taking? Authorizing Provider  calcium carbonate (OS-CAL) 600 MG TABS Take 600 mg by mouth 2 (two) times daily with a meal.   Yes  [provider]  Cholecalciferol (VITAMIN D) 2000 units tablet Take 2,000 Units by mouth daily.   Yes [provider]  Cyanocobalamin 1000 MCG TBCR Take 1,000 mcg by mouth daily.   Yes [provider]  hydrOXYzine (ATARAX/VISTARIL) 25 MG tablet Take 1 tablet (25 mg total) by mouth 3 (three) times daily as needed for itching. 09/05/20  Yes Lesleigh Noe, MD  PARoxetine (PAXIL-CR) 25 MG 24 hr tablet Take 25 mg by mouth daily.   Yes [provider]  triamcinolone ointment (KENALOG) 0.5 % Apply 1 application topically 2 (two) times daily. 09/05/20  Yes Lesleigh Noe, MD    Allergies    Codeine  Review of Systems   Review of Systems  Constitutional:  Positive for appetite change and fatigue. Negative for chills and fever.  Eyes:  Negative for pain and visual disturbance.  Respiratory:  Negative for cough and shortness of breath.   Cardiovascular:  Negative for chest pain and palpitations.  Gastrointestinal:  Positive for diarrhea. Negative for abdominal pain and vomiting.  Genitourinary:  Negative for dysuria and hematuria.  Musculoskeletal:  Positive for arthralgias and myalgias.  Skin:  Positive for rash and wound. Negative for color change.  Neurological:  Positive for light-headedness. Negative for syncope.  All other systems reviewed and are negative.  Physical Exam Updated Vital Signs BP (!) 140/112 (BP Location: Right Wrist)   Pulse (!) 120   Temp 98.8 F (37.1 C) (Oral)   Resp 18   Ht '5\' 3"'$  (1.6 m)   Wt 70.5 kg   SpO2 96%   BMI 27.53 kg/m   Physical Exam Constitutional:      General: She is not in acute distress. HENT:     Head: Normocephalic and atraumatic.  Eyes:     Conjunctiva/sclera: Conjunctivae normal.     Pupils: Pupils are equal, round, and reactive to light.  Cardiovascular:     Rate and Rhythm: Regular rhythm. Tachycardia present.  Pulmonary:     Effort: Pulmonary effort is normal. No respiratory distress.   Abdominal:     General: There is no distension.     Tenderness: There is no abdominal tenderness.  Skin:    General: Skin is warm and dry.     Comments: Discoloration of right foot (see photo) without crepitus; bullae noted, tenderness of right lower leg compartments without firmness or crepitus  Neurological:     General: No focal deficit present.     Mental Status:  She is alert. Mental status is at baseline.  Psychiatric:        Mood and Affect: Mood normal.        Behavior: Behavior normal.      ED Results / Procedures / Treatments   Labs (all labs ordered are listed, but only abnormal results are displayed) Labs Reviewed  LACTIC ACID, PLASMA - Abnormal; Notable for the following components:      Result Value   Lactic Acid, Venous 4.7 (*)    All other components within normal limits  LACTIC ACID, PLASMA - Abnormal; Notable for the following components:   Lactic Acid, Venous 3.0 (*)    All other components within normal limits  CBC WITH DIFFERENTIAL/PLATELET - Abnormal; Notable for the following components:   Neutro Abs 8.4 (*)    Lymphs Abs 0.6 (*)    Abs Immature Granulocytes 0.60 (*)    All other components within normal limits  BASIC METABOLIC PANEL - Abnormal; Notable for the following components:   CO2 20 (*)    Glucose, Bld 113 (*)    BUN 31 (*)    Creatinine, Ser 1.48 (*)    Calcium 8.7 (*)    GFR, Estimated 36 (*)    All other components within normal limits  RESP PANEL BY RT-PCR (FLU A&B, COVID) ARPGX2  C DIFFICILE QUICK SCREEN W PCR REFLEX    CULTURE, BLOOD (ROUTINE X 2)  CULTURE, BLOOD (ROUTINE X 2)  MRSA NEXT GEN BY PCR, NASAL  CBC  BASIC METABOLIC PANEL    EKG EKG Interpretation  Date/Time:  Saturday October 08 2020 15:06:34 EDT Ventricular Rate:  106 PR Interval:  178 QRS Duration: 86 QT Interval:  347 QTC Calculation: 461 R Axis:   27 Text Interpretation: Sinus tachycardia Consider left atrial enlargement Low voltage, precordial leads  Confirmed by Octaviano Glow [phone]) on 10/08/2020 3:11:56 PM  Radiology US Venous Img Lower Unilateral Right  Result Date: 10/07/2020 CLINICAL DATA:  Right lower extremity pain and edema. History of breast cancer. Evaluate for DVT. EXAM: RIGHT LOWER EXTREMITY VENOUS DOPPLER ULTRASOUND TECHNIQUE: Gray-scale sonography with graded compression, as well as color Doppler and duplex ultrasound were performed to evaluate the lower extremity deep venous systems from the level of the common femoral vein and including the common femoral, femoral, profunda femoral, popliteal and calf veins including the posterior tibial, peroneal and gastrocnemius veins when visible. The superficial great saphenous vein was also interrogated. Spectral Doppler was utilized to evaluate flow at rest and with distal augmentation maneuvers in the common femoral, femoral and popliteal veins. COMPARISON:  None. FINDINGS: Contralateral Common Femoral Vein: Respiratory phasicity is normal and symmetric with the symptomatic side. No evidence of thrombus. Normal compressibility. Common Femoral Vein: No evidence of thrombus. Normal compressibility, respiratory phasicity and response to augmentation. Saphenofemoral Junction: No evidence of thrombus. Normal compressibility and flow on color Doppler imaging. Profunda Femoral Vein: No evidence of thrombus. Normal compressibility and flow on color Doppler imaging. Femoral Vein: No evidence of thrombus. Normal compressibility, respiratory phasicity and response to augmentation. Popliteal Vein: No evidence of thrombus. Normal compressibility, respiratory phasicity and response to augmentation. Calf Veins: No evidence of thrombus. Normal compressibility and flow on color Doppler imaging. Superficial Great Saphenous Vein: No evidence of thrombus. Normal compressibility. Venous Reflux:  None. Other Findings:  None. IMPRESSION: No evidence of DVT within the right lower extremity. Electronically Signed   By:  Sandi Mariscal M.D.   On: 10/07/2020 09:33   DG Chest [facility]  1 View  Result Date: 10/08/2020 CLINICAL DATA:  Questionable sepsis - evaluate for abnormality EXAM: PORTABLE CHEST 1 VIEW COMPARISON:  November 01, 2006 FINDINGS: Low lung volume film which limits evaluation. Mildly enlarged cardiomediastinal silhouette with irregular margins of the RIGHT perihilar border. Elevation of the RIGHT hemidiaphragm. No pleural effusion. No pneumothorax. Minimal LEFT retrocardiac opacity, likely atelectasis. RIGHT basilar linear opacities, likely atelectasis. Visualized abdomen is unremarkable. Status post LEFT shoulder arthroplasty. IMPRESSION: 1. Low lung volume film limits evaluation. There is irregular contour of the RIGHT perihilar border. This could be due to inspiratory effort. Recommend follow-up PA and lateral chest radiograph versus chest CT for improved evaluation. 2. Bibasilar linear opacities, likely atelectasis. Differential considerations include infection. Electronically Signed   By: Valentino Saxon M.D.   On: 10/08/2020 14:24   DG Foot Complete Right  Result Date: 10/07/2020 CLINICAL DATA:  80 year old female with pain and swelling in the right foot but no known injury. EXAM: RIGHT FOOT COMPLETE - 3+ VIEW COMPARISON:  None. FINDINGS: Three portable views. Bone mineralization is within normal limits. Calcaneus intact. Tarsal bones appear intact and normally aligned. Distal joint spaces appear normal for age. No acute osseous abnormality identified. Dorsal soft tissue swelling. No soft tissue gas. No radiopaque foreign body identified. IMPRESSION: Soft tissue swelling with no acute osseous abnormality identified. Electronically Signed   By: Genevie Ann M.D.   On: 10/07/2020 09:15   CT EXTREMITY LOWER RIGHT WO CONTRAST  Result Date: 10/08/2020 CLINICAL DATA:  Right foot infection. Evaluate for gas-forming organism. EXAM: CT OF THE LOWER RIGHT EXTREMITY WITHOUT CONTRAST TECHNIQUE: Multidetector CT imaging of  the right lower extremity was performed according to the standard protocol. COMPARISON:  Right foot x-rays from yesterday. FINDINGS: Bones/Joint/Cartilage No bony destruction or periosteal reaction. No fracture or dislocation. Joint spaces are preserved. No joint effusion. Ligaments Ligaments are suboptimally evaluated by CT. Muscles and Tendons Grossly intact. Soft tissue Scattered soft tissue swelling of the lower leg. Severe soft tissue swelling of the ankle and foot with several skin blisters noted. No subcutaneous emphysema. No fluid collection or hematoma. No soft tissue mass. IMPRESSION: 1. Severe soft tissue swelling of the ankle and foot with several skin blisters noted. No subcutaneous emphysema or discrete abscess. 2. No acute osseous abnormality. Electronically Signed   By: Titus Dubin M.D.   On: 10/08/2020 15:24    Procedures .Critical Care  Date/Time: 10/08/2020 2:01 PM Performed by: Wyvonnia Dusky, MD Authorized by: Wyvonnia Dusky, MD   Critical care provider statement:    Critical care time (minutes):  45   Critical care was necessary to treat or prevent imminent or life-threatening deterioration of the following conditions:  Sepsis   Critical care was time spent personally by me on the following activities:  Discussions with consultants, evaluation of patient's response to treatment, examination of patient, ordering and performing treatments and interventions, ordering and review of laboratory studies, ordering and review of radiographic studies, pulse oximetry, re-evaluation of patient's condition, obtaining history from patient or surrogate and review of old charts   Medications Ordered in ED Medications  lactated ringers infusion ( Intravenous New Bag/Given (Non-Interop) 10/08/20 1511)  hydrOXYzine (ATARAX/VISTARIL) tablet 25 mg (has no administration in time range)  miconazole (MICOTIN) 2 % cream (has no administration in time range)  acetaminophen (TYLENOL) tablet 650  mg (has no administration in time range)    Or  acetaminophen (TYLENOL) suppository 650 mg (has no administration in time range)  doxycycline (VIBRA-TABS) tablet  100 mg (has no administration in time range)  heparin injection 5,000 Units (has no administration in time range)  calcium carbonate (OS-CAL - dosed in mg of elemental calcium) tablet 500 mg of elemental calcium (has no administration in time range)  acidophilus (RISAQUAD) capsule 1 capsule (has no administration in time range)  PARoxetine (PAXIL-CR) 24 hr tablet 25 mg (has no administration in time range)  melatonin tablet 3 mg (has no administration in time range)  lactated ringers bolus 1,000 mL (0 mLs Intravenous Stopped 10/08/20 1725)    And  lactated ringers bolus 1,000 mL (0 mLs Intravenous Stopped 10/08/20 1725)    And  lactated ringers bolus 250 mL (0 mLs Intravenous Stopped 10/08/20 1828)  vancomycin (VANCOREADY) IVPB 1500 mg/300 mL (0 mg Intravenous Stopped 10/08/20 1826)  cefTRIAXone (ROCEPHIN) 2 g in sodium chloride 0.9 % 100 mL IVPB (0 g Intravenous Stopped 10/08/20 1514)  morphine 4 MG/ML injection 4 mg (4 mg Intravenous Given 10/08/20 1427)  ondansetron (ZOFRAN) injection 4 mg (4 mg Intravenous Given 10/08/20 1428)    ED Course  I have reviewed the triage vital signs and the nursing notes.  Pertinent labs & imaging results that were available during my care of the patient were reviewed by me and considered in my medical decision making (see chart for details).  Patient arrives tachycardic, with diarrhea, evidence of dehydration on exam.  This may be related to diarrhea (question of C. difficile given that she started antibiotics?),  Or possibly an infection.  Her lactate is elevated over 4.  She does have borderline hypotension but reports her blood pressure is always on the low side.  I have initiated a code sepsis.  We will start a 30 cc/kg fluid bolus, initiate IV vancomycin and Rocephin for lower extremity cellulitis  and skin infection.  I have ordered a CT scan of the soft tissue over the lower extremity as well.  With the discoloration of her skin, it does raise some concern for possible gas-forming infection. She does not however of crepitus, does have preserved sensation in her foot.  We will need to watch her closely.  She will need a repeat lactate after her fluid bolus completes.  I placed on contact precautions and advised C diff testing.  I personally reviewed her prior medical records including her ED visit yesterday and her work-up at the time.   Clinical Course as of 10/08/20 2112  Sat Oct 08, 2020  1529 Severe soft tissue swelling of the ankle and foot with several skin blisters noted. No subcutaneous emphysema or discrete abscess. 2. No acute osseous abnormality. [MT]  [id number] She is still mentating and feeling well.  Will page for admission to hospitalist now [MT]  1649 Admitted to hospitalist Dr Roosevelt Locks [MT]    Clinical Course User Index [MT] Wyvonnia Dusky, MD   Final Clinical Impression(s) / ED Diagnoses Final diagnoses:  Sepsis, due to unspecified organism, unspecified whether acute organ dysfunction present ([place])  Diarrhea, unspecified type  AKI (acute kidney injury) ([place])  Cellulitis of right lower extremity    Rx / DC Orders ED Discharge Orders     None        Wyvonnia Dusky, MD 10/08/20 2113

## 2020-10-08 NOTE — Sepsis Progress Note (Signed)
Notified bedside nurse of need to draw repeat lactic acid. 

## 2020-10-08 NOTE — ED Provider Notes (Signed)
Emergency Medicine Provider Triage Evaluation Note  [name] Price , a 80 y.o. female  was evaluated in triage.  Pt complains of for cellulitis of foot started on antibiotics yesterday states that she is having loose stool.  She states she is having numerous episodes of loose stool.  Denies any fevers.  States that she feels that her cellulitis is worse.  She has taken antibiotics yesterday seems to have been taking every 24 hours now.  Review of Systems  Positive: Right foot pain/cellulitis, diarrhea Negative: Fever  Physical Exam  There were no vitals taken for this visit. Gen:   Awake, no distress   Resp:  Normal effort  MSK:   Moves extremities without difficulty  Other:  Cellulitic right lower extremity  Medical Decision Making  Medically screening exam initiated at 11:01 AM.  Appropriate orders placed.  [name] Price was informed that the remainder of the evaluation will be completed by another provider, this initial triage assessment does not replace that evaluation, and the importance of remaining in the ED until their evaluation is complete.  Will repeat basic labs.  Patient is tachycardic. Is not hypoxic.  Initial SPO2 was erroneously documented as hypoxic.  Patient is satting 97% on room air.   Tedd Sias, [place] 10/08/20 1105    Lacretia Leigh, MD 10/09/20 [phone]

## 2020-10-08 NOTE — H&P (Signed)
History and Physical    [name] Price [id number] DOB: [birth year] DOA: 10/08/2020  PCP: Tonia Ghent, MD (Confirm with patient/family/NH records and if not entered, this has to be entered at [facility] point of entry) Patient coming from: Home  I have personally briefly reviewed patient's old medical records in [place]  Chief Complaint: Diarrhea.  HPI: [name] Price is a 80 y.o. female with medical history significant of anxiety/depression, came with persistent right foot rash and blister and new onset of diarrhea.  Patient started to develop and ichness on the right foot between toes in July, went to see PCP, who prescribed her with topical Triamcinolone for two weeks. Despite using the cream, she continued to have rash and ichyness, who progressed to almost the whole right foot. Last week, she went to see dermatologist, who proscribe her with topical Dexamethasone, she used for one week, but then started to develop blisters and painful rash on the right foot. No fever.  She came in to ED yesterday, DVT study was negative. She was discharged home with Keflex. She went home, and took the Keflex three times yesterday, then in the middle of the night, she started to have watery diarrhea, almost every hour, but no abd pain.  ED Course: Tachy and low fever of 63F. BP stable. Lactic acid 4.3 and Cre 1.48 compared to 0.8 yesterday. CT foot showed no foreign body but diffused soft tissue swelling.  Review of Systems: As per HPI otherwise 14 point review of systems negative.    Past Medical History:  Diagnosis Date   Allergy    Anxiety    Arthritis    Blood transfusion without reported diagnosis    as baby   Breast cancer ([place]) 1999-2000   s/p lumpectomy and radiation, no chemo-right   History of chicken pox    History of hip replacement    Hot flashes    treated wtih paxil   Hyperlipidemia    Osteoporosis    DXA 11/12/17   UTI (urinary tract infection)     Past  Surgical History:  Procedure Laterality Date   BREAST SURGERY Right    CATARACT EXTRACTION W/PHACO Left 12/28/2014   Procedure: CATARACT EXTRACTION PHACO AND INTRAOCULAR LENS PLACEMENT ([place]);  Surgeon: Birder Robson, MD;  Location: [facility] ORS;  Service: Ophthalmology;  Laterality: Left;  [place] 00:38 AP% 19.9 CDE 7.70 fluid pack lot # [id number]   CATARACT EXTRACTION W/PHACO Right 07/10/2016   Procedure: CATARACT EXTRACTION PHACO AND INTRAOCULAR LENS PLACEMENT ([facility]);  Surgeon: Birder Robson, MD;  Location: [facility] ORS;  Service: Ophthalmology;  Laterality: Right;  [place] 00:38 AP% 18.3 CDE 7.06 Fluid pack lot # [id number]   COLONOSCOPY  2016   EYE SURGERY     JOINT REPLACEMENT Bilateral    hip  / shoulder   OOPHORECTOMY Left    POLYPECTOMY     TOTAL HIP ARTHROPLASTY Bilateral    (2) Two   TOTAL SHOULDER REPLACEMENT       reports that she has never smoked. She has never used smokeless tobacco. She reports that she does not drink alcohol and does not use drugs.  Allergies  Allergen Reactions   Codeine Nausea And Vomiting    Family History  Problem Relation Age of Onset   Lung cancer Mother    Heart disease Mother    Colon cancer Mother        late 53's   Cancer Father    Colon cancer Father 50  died at 62   Breast cancer Maternal Aunt    Breast cancer Maternal Aunt    Breast cancer Maternal Aunt    Rectal cancer Neg Hx    Stomach cancer Neg Hx    Crohn's disease Neg Hx    Esophageal cancer Neg Hx     Prior to Admission medications   Medication Sig Start Date End Date Taking? Authorizing Provider  calcium carbonate (OS-CAL) 600 MG TABS Take 600 mg by mouth 2 (two) times daily with a meal.    [provider]  cephALEXin (KEFLEX) 250 MG capsule Take 1 capsule (250 mg total) by mouth 4 (four) times daily. 10/07/20   Davonna Belling, MD  Cholecalciferol (VITAMIN D) 2000 units tablet Take 2,000 Units by mouth daily.    [provider]  Cyanocobalamin 1000  MCG TBCR Take 1,000 mcg by mouth daily.    [provider]  hydrOXYzine (ATARAX/VISTARIL) 25 MG tablet Take 1 tablet (25 mg total) by mouth 3 (three) times daily as needed for itching. 09/05/20   Lesleigh Noe, MD  PARoxetine (PAXIL-CR) 25 MG 24 hr tablet Take 25 mg by mouth daily.    [provider]  triamcinolone ointment (KENALOG) 0.5 % Apply 1 application topically 2 (two) times daily. 09/05/20   Lesleigh Noe, MD    Physical Exam: Vitals:   10/08/20 1418 10/08/20 1428 10/08/20 1515 10/08/20 1530  BP: (!) 96/53 (!) 102/54 107/72 108/78  Pulse: (!) 109 100 (!) 110 (!) 109  Resp: (!) 22 (!) 29 (!) 22 18  Temp:      TempSrc:      SpO2: 96% 99% 99% 95%    Constitutional: NAD, calm, comfortable Vitals:   10/08/20 1418 10/08/20 1428 10/08/20 1515 10/08/20 1530  BP: (!) 96/53 (!) 102/54 107/72 108/78  Pulse: (!) 109 100 (!) 110 (!) 109  Resp: (!) 22 (!) 29 (!) 22 18  Temp:      TempSrc:      SpO2: 96% 99% 99% 95%   Eyes: PERRL, lids and conjunctivae normal ENMT: Mucous membranes are dry. Posterior pharynx clear of any exudate or lesions.Normal dentition.  Neck: normal, supple, no masses, no thyromegaly Respiratory: clear to auscultation bilaterally, no wheezing, no crackles. Normal respiratory effort. No accessory muscle use.  Cardiovascular: Regular rate and rhythm, no murmurs / rubs / gallops. No extremity edema. 2+ pedal pulses. No carotid bruits.  Abdomen: no tenderness, no masses palpated. No hepatosplenomegaly. Bowel sounds positive.  Musculoskeletal: no clubbing / cyanosis. No joint deformity upper and lower extremities. Good ROM, no contractures. Normal muscle tone.  Skin: right foot painful rash with large merging blister Neurologic: CN 2-12 grossly intact. Sensation intact, DTR normal. Strength 5/5 in all 4.  Psychiatric: Normal judgment and insight. Alert and oriented x 3. Normal mood.      (Anything < 9 systems with 2 bullets each down codes to  level 1) (If patient refuses exam can't bill higher level) (Make sure to document decubitus ulcers present on admission -- if possible -- and whether patient has chronic indwelling catheter at time of admission)  Labs on Admission: I have personally reviewed following labs and imaging studies  CBC: Recent Labs  Lab 10/07/20 0834 10/08/20 1110  WBC 16.9* 9.9  NEUTROABS 14.9* 8.4*  HGB 14.2 13.6  HCT 42.7 40.3  MCV 95.5 92.6  PLT 172 [id number]   Basic Metabolic Panel: Recent Labs  Lab 10/07/20 0834 10/08/20 1110  NA 133* 136  K 3.6 3.6  CL 101 103  CO2 23 20*  GLUCOSE 126* 113*  BUN 20 31*  CREATININE 0.86 1.48*  CALCIUM 8.8* 8.7*   GFR: Estimated Creatinine Clearance: 29 mL/min (A) (by C-G formula based on SCr of 1.48 mg/dL (H)). Liver Function Tests: No results for input(s): AST, ALT, ALKPHOS, BILITOT, PROT, ALBUMIN in the last 168 hours. No results for input(s): LIPASE, AMYLASE in the last 168 hours. No results for input(s): AMMONIA in the last 168 hours. Coagulation Profile: No results for input(s): INR, PROTIME in the last 168 hours. Cardiac Enzymes: No results for input(s): CKTOTAL, CKMB, CKMBINDEX, TROPONINI in the last 168 hours. BNP (last 3 results) No results for input(s): PROBNP in the last 8760 hours. HbA1C: No results for input(s): HGBA1C in the last 72 hours. CBG: No results for input(s): GLUCAP in the last 168 hours. Lipid Profile: No results for input(s): CHOL, HDL, LDLCALC, TRIG, CHOLHDL, LDLDIRECT in the last 72 hours. Thyroid Function Tests: No results for input(s): TSH, T4TOTAL, FREET4, T3FREE, THYROIDAB in the last 72 hours. Anemia Panel: No results for input(s): VITAMINB12, FOLATE, FERRITIN, TIBC, IRON, RETICCTPCT in the last 72 hours. Urine analysis:    Component Value Date/Time   COLORURINE YELLOW 11/01/2006 1310   APPEARANCEUR CLEAR 11/01/2006 1310   LABSPEC 1.007 11/01/2006 1310   PHURINE 6.0 11/01/2006 1310   GLUCOSEU NEGATIVE 11/01/2006  1310   HGBUR SMALL (A) 11/01/2006 1310   BILIRUBINUR NEGATIVE 11/01/2006 1310   KETONESUR NEGATIVE 11/01/2006 1310   PROTEINUR NEGATIVE 11/01/2006 1310   UROBILINOGEN 0.2 11/01/2006 1310   NITRITE NEGATIVE 11/01/2006 1310   LEUKOCYTESUR TRACE (A) 11/01/2006 1310    Radiological Exams on Admission: US Venous Img Lower Unilateral Right  Result Date: 10/07/2020 CLINICAL DATA:  Right lower extremity pain and edema. History of breast cancer. Evaluate for DVT. EXAM: RIGHT LOWER EXTREMITY VENOUS DOPPLER ULTRASOUND TECHNIQUE: Gray-scale sonography with graded compression, as well as color Doppler and duplex ultrasound were performed to evaluate the lower extremity deep venous systems from the level of the common femoral vein and including the common femoral, femoral, profunda femoral, popliteal and calf veins including the posterior tibial, peroneal and gastrocnemius veins when visible. The superficial great saphenous vein was also interrogated. Spectral Doppler was utilized to evaluate flow at rest and with distal augmentation maneuvers in the common femoral, femoral and popliteal veins. COMPARISON:  None. FINDINGS: Contralateral Common Femoral Vein: Respiratory phasicity is normal and symmetric with the symptomatic side. No evidence of thrombus. Normal compressibility. Common Femoral Vein: No evidence of thrombus. Normal compressibility, respiratory phasicity and response to augmentation. Saphenofemoral Junction: No evidence of thrombus. Normal compressibility and flow on color Doppler imaging. Profunda Femoral Vein: No evidence of thrombus. Normal compressibility and flow on color Doppler imaging. Femoral Vein: No evidence of thrombus. Normal compressibility, respiratory phasicity and response to augmentation. Popliteal Vein: No evidence of thrombus. Normal compressibility, respiratory phasicity and response to augmentation. Calf Veins: No evidence of thrombus. Normal compressibility and flow on color  Doppler imaging. Superficial Great Saphenous Vein: No evidence of thrombus. Normal compressibility. Venous Reflux:  None. Other Findings:  None. IMPRESSION: No evidence of DVT within the right lower extremity. Electronically Signed   By: Sandi Mariscal M.D.   On: 10/07/2020 09:33   DG Chest Port 1 View  Result Date: 10/08/2020 CLINICAL DATA:  Questionable sepsis - evaluate for abnormality EXAM: PORTABLE CHEST 1 VIEW COMPARISON:  November 01, 2006 FINDINGS: Low lung volume film which limits evaluation. Mildly enlarged  cardiomediastinal silhouette with irregular margins of the RIGHT perihilar border. Elevation of the RIGHT hemidiaphragm. No pleural effusion. No pneumothorax. Minimal LEFT retrocardiac opacity, likely atelectasis. RIGHT basilar linear opacities, likely atelectasis. Visualized abdomen is unremarkable. Status post LEFT shoulder arthroplasty. IMPRESSION: 1. Low lung volume film limits evaluation. There is irregular contour of the RIGHT perihilar border. This could be due to inspiratory effort. Recommend follow-up PA and lateral chest radiograph versus chest CT for improved evaluation. 2. Bibasilar linear opacities, likely atelectasis. Differential considerations include infection. Electronically Signed   By: Valentino Saxon M.D.   On: 10/08/2020 14:24   DG Foot Complete Right  Result Date: 10/07/2020 CLINICAL DATA:  80 year old female with pain and swelling in the right foot but no known injury. EXAM: RIGHT FOOT COMPLETE - 3+ VIEW COMPARISON:  None. FINDINGS: Three portable views. Bone mineralization is within normal limits. Calcaneus intact. Tarsal bones appear intact and normally aligned. Distal joint spaces appear normal for age. No acute osseous abnormality identified. Dorsal soft tissue swelling. No soft tissue gas. No radiopaque foreign body identified. IMPRESSION: Soft tissue swelling with no acute osseous abnormality identified. Electronically Signed   By: Genevie Ann M.D.   On: 10/07/2020  09:15   CT EXTREMITY LOWER RIGHT WO CONTRAST  Result Date: 10/08/2020 CLINICAL DATA:  Right foot infection. Evaluate for gas-forming organism. EXAM: CT OF THE LOWER RIGHT EXTREMITY WITHOUT CONTRAST TECHNIQUE: Multidetector CT imaging of the right lower extremity was performed according to the standard protocol. COMPARISON:  Right foot x-rays from yesterday. FINDINGS: Bones/Joint/Cartilage No bony destruction or periosteal reaction. No fracture or dislocation. Joint spaces are preserved. No joint effusion. Ligaments Ligaments are suboptimally evaluated by CT. Muscles and Tendons Grossly intact. Soft tissue Scattered soft tissue swelling of the lower leg. Severe soft tissue swelling of the ankle and foot with several skin blisters noted. No subcutaneous emphysema. No fluid collection or hematoma. No soft tissue mass. IMPRESSION: 1. Severe soft tissue swelling of the ankle and foot with several skin blisters noted. No subcutaneous emphysema or discrete abscess. 2. No acute osseous abnormality. Electronically Signed   By: Titus Dubin M.D.   On: 10/08/2020 15:24    EKG: Independently reviewed. Sinus tachycardia  Assessment/Plan Active Problems:   AKI (acute kidney injury) ([place])  (please populate well all problems here in Problem List. (For example, if patient is on BP meds at home and you resume or decide to hold them, it is a problem that needs to be her. Same for CAD, COPD, HLD and so on)  Right foot cellulitis -Failed outpatient PO ABX regimen -Given her cellulitis symptoms not worsening compared to yesterday and WBC count has come down, and the blister fluid appear to be thin, and unlikely a MRSA infection, will not continue Vancomycin -Send MRSA screening -Switch to po Doxycycline -[facility], patient was given two rounds of topical steroid on July 18th of two weeks of topical Triamcinolone and last week 7 days of topical Dexamethasone, strongly suspect there is a concurrent fungal  infection, will try topical micronazole. -Consult Wound care.  Early sepsis -Evidenced by tachycardia, and elevated lactate level, and early end organ damage of a new AKI. Source is to rule out c diff colitis. Her cellulitis appears to be stable. -Received 2.5 L of iv bolus and continue IVF at 150 ml/hr for 12 hour and re-evaluate.  AKI -Pre-renal from severe diarrhea -Hydration, repeat BMP in AM>  Diarrhea  -Rule out C diff colitis. She reported no more  diarrhea since this morning. -May be just a intolerance to Keflex. -Add Probiotics  Right foot cellulitis -DVT ruled out yesterday -MRSA screening -Change ABX to PO doxycycline -Topical micronazole for concurrent fungal infection.    DVT prophylaxis: Heparin sub Code Status: Full Code Family Communication: None at bedside Disposition Plan: Expect more than 2 midnight hospital stay for AKI and sepsis treatment. Consults called: None Admission status: Tele admit   Lequita Halt MD [facility] Pager [phone]  10/08/2020, 5:04 PM

## 2020-10-08 NOTE — ED Notes (Signed)
Called no answer

## 2020-10-08 NOTE — ED Notes (Signed)
Patient transported to MRI 

## 2020-10-08 NOTE — Sepsis Progress Note (Signed)
[facility] following sepsis 

## 2020-10-09 DIAGNOSIS — E876 Hypokalemia: Secondary | ICD-10-CM

## 2020-10-09 DIAGNOSIS — L03119 Cellulitis of unspecified part of limb: Secondary | ICD-10-CM

## 2020-10-09 LAB — BASIC METABOLIC PANEL
Anion gap: 9 (ref 5–15)
BUN: 37 mg/dL — ABNORMAL HIGH (ref 8–23)
CO2: 19 mmol/L — ABNORMAL LOW (ref 22–32)
Calcium: 8.4 mg/dL — ABNORMAL LOW (ref 8.9–10.3)
Chloride: 104 mmol/L (ref 98–111)
Creatinine, Ser: 1.26 mg/dL — ABNORMAL HIGH (ref 0.44–1.00)
GFR, Estimated: 44 mL/min — ABNORMAL LOW (ref >60)
Glucose, Bld: 89 mg/dL (ref 70–99)
Potassium: 3.2 mmol/L — ABNORMAL LOW (ref 3.5–5.1)
Sodium: 132 mmol/L — ABNORMAL LOW (ref 135–145)

## 2020-10-09 LAB — LACTIC ACID, PLASMA: Lactic Acid, Venous: 1.9 mmol/L (ref 0.5–1.9)

## 2020-10-09 LAB — CBC
HCT: 37.3 % (ref 36.0–46.0)
Hemoglobin: 12.6 g/dL (ref 12.0–15.0)
MCH: 30.9 pg (ref 26.0–34.0)
MCHC: 33.8 g/dL (ref 30.0–36.0)
MCV: 91.4 fL (ref 80.0–100.0)
Platelets: 145 10*3/uL — ABNORMAL LOW (ref 150–400)
RBC: 4.08 MIL/uL (ref 3.87–5.11)
RDW: 13.4 % (ref 11.5–15.5)
WBC: 4.3 10*3/uL (ref 4.0–10.5)
nRBC: 0 % (ref 0.0–0.2)

## 2020-10-09 MED ORDER — LOPERAMIDE HCL 2 MG PO CAPS: 2.0000 mg | ORAL_CAPSULE | ORAL | Status: DC | PRN

## 2020-10-09 MED ORDER — LACTATED RINGERS IV SOLN: INTRAVENOUS | Status: DC

## 2020-10-09 MED ORDER — POTASSIUM CHLORIDE 10 MEQ/100ML IV SOLN
10.0000 meq | INTRAVENOUS | Status: AC
  Administered 2020-10-09 (×4): 10 meq via INTRAVENOUS
  Filled 2020-10-09 (×4): qty 100

## 2020-10-09 NOTE — Consult Note (Signed)
[place] Nurse Consult Note: Patient receiving care in [facility] 2W03 Reason for Consult: Right foot wound Wound type: Cellulitis of the right foot with reactive blood blister. This was present on admission and has been evaluated by Paralee Cancel, MD. WOC will follow and place orders directed by this MD. "I will have the blister lanced to decompress in a more direct fashion as opposed to it tearing open We will apply Xeroform to area and wrap with soft dressing No further treatment as she is on ABX to treat the cellulitis" These orders have been placed for the bedside nurse to follow. Monitor the wound area(s) for worsening of condition such as: Signs/symptoms of infection, increase in size, development of or worsening of odor, development of pain, or increased pain at the affected locations.   Notify the medical team if any of these develop.  Thank you for the consult. [place] nurse will not follow at this time.   Please re-consult the [place] team if needed.  Cathlean Marseilles Tamala Julian, MSN, RN, [place], Lysle Pearl, [facility] Wound Treatment Associate Pager [phone]

## 2020-10-09 NOTE — Progress Notes (Signed)
PROGRESS NOTE  [name] [id number] DOB: 23-May-1940 DOA: 10/08/2020 PCP: Tonia Ghent, MD   LOS: 1 day   Brief narrative:  [name] Price is a 80 y.o. female with past medical history of anxiety depression presented to the hospital with right foot rash and blister with new onset diarrhea.  Patient recently had some itching around the right foot and had been prescribed for triamcinolone for 2 weeks but continued to have rash and itchiness which progressed.  He went to see her dermatologist who prescribed topical dexamethasone for a week but started having blisters and painful rash without any fever.  She then came into the ED and DVT study was negative and was given Keflex but started having multiple episodes of watery diarrhea and increasing blister and redness.  Patient then presented back to the ED where she was noted to be mildly tachycardic with a temperature of around 99 F.  Lactate was elevated at 4.3 and creatinine elevated at 1.4 compared to 0.8.  CT scan of the foot did not show any evidence of foreign body but diffuse soft tissue swelling.  Patient was transferred to hospital for further evaluation and treatment.  Assessment/Plan:  Active Problems:   AKI (acute kidney injury) ([place])  Right foot cellulitis with enlarging large blister.  Query hemorrhagic. Currently on doxycycline.  Had received steroids as outpatient.  Has increased blisters.  Will avoid topical application for now.  Redness and blister has significantly increased.  Spoke with general surgery who recommended orthopedic evaluation.  Have consulted orthopedics.  DVT has been ruled out.  Early sepsis likely secondary to foot cellulitis. -Evidenced by tachycardia, and elevated lactate level, and early end organ damage of a new AKI. -Received 2.5 L of iv bolus and continue IVF at 150 ml/hr for 12 hour.  Will check lactate again.  Hypokalemia.  Continue IV potassium replacement.  Check levels in  a.m.  Acute kidney injury likely secondary to prerenal azotemia from severe diarrhea. Continue to monitor closely.   Diarrhea  C. difficile was negative.  Will treat symptomatically  DVT prophylaxis: heparin injection 5,000 Units Start: 10/08/20 2200  Code Status: Full code  Family Communication: None  Status is: Inpatient  Remains inpatient appropriate because:Unsafe d/c plan, IV treatments appropriate due to intensity of illness or inability to take PO, and Inpatient level of care appropriate due to severity of illness  Dispo: The patient is from: Home              Anticipated d/c is to: Home              Patient currently is not medically stable to d/c.   Difficult to place patient No   Consultants: Will consult orthopedics  Procedures: None  Anti-infectives:  Doxycycline  Anti-infectives (From admission, onward)    Start     Dose/Rate Route Frequency Ordered Stop   10/08/20 2200  doxycycline (VIBRA-TABS) tablet 100 mg        100 mg Oral Every 12 hours 10/08/20 1704 10/15/20 2159   10/08/20 1408  vancomycin variable dose per unstable renal function (pharmacist dosing)  Status:  Discontinued         Does not apply See admin instructions 10/08/20 1408 10/08/20 1703   10/08/20 1400  vancomycin (VANCOREADY) IVPB 1500 mg/300 mL        1,500 mg 150 mL/hr over 120 Minutes Intravenous  Once 10/08/20 1358 10/08/20 1826   10/08/20 1400  cefTRIAXone (ROCEPHIN) 2 g in  sodium chloride 0.9 % 100 mL IVPB        2 g 200 mL/hr over 30 Minutes Intravenous  Once 10/08/20 1358 10/08/20 1514      Subjective: Today, patient was seen and examined at bedside.  Patient denies any nausea, vomiting fever chills or rigor.  Denies any shortness of breath, cough, fever  Objective: Vitals:   10/09/20 0407 10/09/20 1000  BP: 119/64 125/79  Pulse: (!) 123 (!) 112  Resp: 18 18  Temp: 98.7 F (37.1 C) 98.9 F (37.2 C)  SpO2: 96% 96%   No intake or output data in the 24 hours ending  10/09/20 1054 Filed Weights   10/08/20 2043  Weight: 70.5 kg   Body mass index is 27.53 kg/m.   Physical Exam: GENERAL: Patient is alert awake and oriented. Not in obvious distress. HENT: No scleral pallor or icterus. Pupils equally reactive to light. Oral mucosa is moist NECK: is supple, no gross swelling noted. CHEST: Clear to auscultation. No crackles or wheezes.  Diminished breath sounds bilaterally. CVS: S1 and S2 heard, no murmur. Regular rate and rhythm.  ABDOMEN: Soft, non-tender, bowel sounds are present. EXTREMITIES right foot with significant blister with erythema, redness. CNS: Cranial nerves are intact. No focal motor deficits. SKIN: warm and dry, right foot blisters with erythema, redness and cellulitis           Data Review: I have personally reviewed the following laboratory data and studies,  CBC: Recent Labs  Lab 10/07/20 0834 10/08/20 1110 10/09/20 0356  WBC 16.9* 9.9 4.3  NEUTROABS 14.9* 8.4*  --   HGB 14.2 13.6 12.6  HCT 42.7 40.3 37.3  MCV 95.5 92.6 91.4  PLT 172 166 [id number]*   Basic Metabolic Panel: Recent Labs  Lab 10/07/20 0834 10/08/20 1110 10/09/20 0356  NA 133* 136 132*  K 3.6 3.6 3.2*  CL 101 103 104  CO2 23 20* 19*  GLUCOSE 126* 113* 89  BUN 20 31* 37*  CREATININE 0.86 1.48* 1.26*  CALCIUM 8.8* 8.7* 8.4*   Liver Function Tests: No results for input(s): AST, ALT, ALKPHOS, BILITOT, PROT, ALBUMIN in the last 168 hours. No results for input(s): LIPASE, AMYLASE in the last 168 hours. No results for input(s): AMMONIA in the last 168 hours. Cardiac Enzymes: No results for input(s): CKTOTAL, CKMB, CKMBINDEX, TROPONINI in the last 168 hours. BNP (last 3 results) No results for input(s): BNP in the last 8760 hours.  ProBNP (last 3 results) No results for input(s): PROBNP in the last 8760 hours.  CBG: No results for input(s): GLUCAP in the last 168 hours. Recent Results (from the past 240 hour(s))  Resp Panel by RT-PCR (Flu A&B,  Covid) Nasopharyngeal Swab     Status: None   Collection Time: 10/08/20  1:57 PM   Specimen: Nasopharyngeal Swab; Nasopharyngeal(NP) swabs in vial transport medium  Result Value Ref Range Status   SARS Coronavirus 2 by RT PCR NEGATIVE NEGATIVE Final    Comment: (NOTE) SARS-CoV-2 target nucleic acids are NOT DETECTED.  The SARS-CoV-2 RNA is generally detectable in upper respiratory specimens during the acute phase of infection. The lowest concentration of SARS-CoV-2 viral copies this assay can detect is 138 copies/mL. A negative result does not preclude SARS-Cov-2 infection and should not be used as the sole basis for treatment or other patient management decisions. A negative result may occur with  improper specimen collection/handling, submission of specimen other than nasopharyngeal swab, presence of viral mutation(s) within the areas targeted by  this assay, and inadequate number of viral copies(<138 copies/mL). A negative result must be combined with clinical observations, patient history, and epidemiological information. The expected result is Negative.  Fact Sheet for Patients:  EntrepreneurPulse.com.au  Fact Sheet for Healthcare Providers:  IncredibleEmployment.be  This test is no t yet approved or cleared by the [place] FDA and  has been authorized for detection and/or diagnosis of SARS-CoV-2 by FDA under an Emergency Use Authorization (EUA). This EUA will remain  in effect (meaning this test can be used) for the duration of the COVID-19 declaration under Section 564(b)(1) of the Act, 21 U.S.C.section 360bbb-3(b)(1), unless the authorization is terminated  or revoked sooner.       Influenza A by PCR NEGATIVE NEGATIVE Final   Influenza B by PCR NEGATIVE NEGATIVE Final    Comment: (NOTE) The Xpert Xpress SARS-CoV-2/FLU/RSV plus assay is intended as an aid in the diagnosis of influenza from Nasopharyngeal swab specimens and should  not be used as a sole basis for treatment. Nasal washings and aspirates are unacceptable for Xpert Xpress SARS-CoV-2/FLU/RSV testing.  Fact Sheet for Patients: EntrepreneurPulse.com.au  Fact Sheet for Healthcare Providers: IncredibleEmployment.be  This test is not yet approved or cleared by the [place] FDA and has been authorized for detection and/or diagnosis of SARS-CoV-2 by FDA under an Emergency Use Authorization (EUA). This EUA will remain in effect (meaning this test can be used) for the duration of the COVID-19 declaration under Section 564(b)(1) of the Act, 21 U.S.C. section 360bbb-3(b)(1), unless the authorization is terminated or revoked.  Performed at [facility], [place] [address], [place], [place] [address]   C Difficile Quick Screen w PCR reflex     Status: None   Collection Time: 10/08/20  4:24 PM   Specimen: STOOL  Result Value Ref Range Status   C Diff antigen NEGATIVE NEGATIVE Final   C Diff toxin NEGATIVE NEGATIVE Final   C Diff interpretation No C. difficile detected.  Final    Comment: Performed at [facility], [place] [address], [place], [place] [address]     Studies: DG Chest Port 1 View  Result Date: 10/08/2020 CLINICAL DATA:  Questionable sepsis - evaluate for abnormality EXAM: PORTABLE CHEST 1 VIEW COMPARISON:  November 01, 2006 FINDINGS: Low lung volume film which limits evaluation. Mildly enlarged cardiomediastinal silhouette with irregular margins of the RIGHT perihilar border. Elevation of the RIGHT hemidiaphragm. No pleural effusion. No pneumothorax. Minimal LEFT retrocardiac opacity, likely atelectasis. RIGHT basilar linear opacities, likely atelectasis. Visualized abdomen is unremarkable. Status post LEFT shoulder arthroplasty. IMPRESSION: 1. Low lung volume film limits evaluation. There is irregular contour of the RIGHT perihilar border. This could be due to inspiratory effort. Recommend follow-up PA  and lateral chest radiograph versus chest CT for improved evaluation. 2. Bibasilar linear opacities, likely atelectasis. Differential considerations include infection. Electronically Signed   By: Valentino Saxon M.D.   On: 10/08/2020 14:24   CT EXTREMITY LOWER RIGHT WO CONTRAST  Result Date: 10/08/2020 CLINICAL DATA:  Right foot infection. Evaluate for gas-forming organism. EXAM: CT OF THE LOWER RIGHT EXTREMITY WITHOUT CONTRAST TECHNIQUE: Multidetector CT imaging of the right lower extremity was performed according to the standard protocol. COMPARISON:  Right foot x-rays from yesterday. FINDINGS: Bones/Joint/Cartilage No bony destruction or periosteal reaction. No fracture or dislocation. Joint spaces are preserved. No joint effusion. Ligaments Ligaments are suboptimally evaluated by CT. Muscles and Tendons Grossly intact. Soft tissue Scattered soft tissue swelling of the lower leg. Severe soft  tissue swelling of the ankle and foot with several skin blisters noted. No subcutaneous emphysema. No fluid collection or hematoma. No soft tissue mass. IMPRESSION: 1. Severe soft tissue swelling of the ankle and foot with several skin blisters noted. No subcutaneous emphysema or discrete abscess. 2. No acute osseous abnormality. Electronically Signed   By: Titus Dubin M.D.   On: 10/08/2020 15:24      Flora Lipps, MD  [facility] 10/09/2020  If 7PM-7AM, please contact night-coverage

## 2020-10-09 NOTE — Consult Note (Addendum)
Reason for Consult: right foot blister associated with cellulitis Referring Physician: Louanne Belton, MD (Hospitalist)  [name] Price is an 80 y.o. female.  HPI: [name] Price is a 80 y.o. female with past medical history of anxiety depression presented to the hospital with right foot rash and blister with new onset diarrhea.  Patient recently had some itching around the right foot and had been prescribed for triamcinolone for 2 weeks but continued to have rash and itchiness which progressed.  He went to see her dermatologist who prescribed topical dexamethasone for a week but started having blisters and painful rash without any fever.  She then came into the ED and DVT study was negative and was given Keflex but started having multiple episodes of watery diarrhea and increasing blister and redness.  Patient then presented back to the ED where she was noted to be mildly tachycardic with a temperature of around 99 F.  Lactate was elevated at 4.3 and creatinine elevated at 1.4 compared to 0.8.  CT scan of the foot did not show any evidence of foreign body but diffuse soft tissue swelling.  Patient was transferred to hospital for further evaluation and treatment.  Past Medical History:  Diagnosis Date   Allergy    Anxiety    Arthritis    Blood transfusion without reported diagnosis    as baby   Breast cancer ([place]) 1999-2000   s/p lumpectomy and radiation, no chemo-right   History of chicken pox    History of hip replacement    Hot flashes    treated wtih paxil   Hyperlipidemia    Osteoporosis    DXA 11/12/17   UTI (urinary tract infection)     Past Surgical History:  Procedure Laterality Date   BREAST SURGERY Right    CATARACT EXTRACTION W/PHACO Left 12/28/2014   Procedure: CATARACT EXTRACTION PHACO AND INTRAOCULAR LENS PLACEMENT ([place]);  Surgeon: Birder Robson, MD;  Location: [facility] ORS;  Service: Ophthalmology;  Laterality: Left;  [place] 00:38 AP% 19.9 CDE 7.70 fluid pack lot # [id number]    CATARACT EXTRACTION W/PHACO Right 07/10/2016   Procedure: CATARACT EXTRACTION PHACO AND INTRAOCULAR LENS PLACEMENT ([facility]);  Surgeon: Birder Robson, MD;  Location: [facility] ORS;  Service: Ophthalmology;  Laterality: Right;  [place] 00:38 AP% 18.3 CDE 7.06 Fluid pack lot # [id number]   COLONOSCOPY  2016   EYE SURGERY     JOINT REPLACEMENT Bilateral    hip  / shoulder   OOPHORECTOMY Left    POLYPECTOMY     TOTAL HIP ARTHROPLASTY Bilateral    (2) Two   TOTAL SHOULDER REPLACEMENT      Family History  Problem Relation Age of Onset   Lung cancer Mother    Heart disease Mother    Colon cancer Mother        late 27's   Cancer Father    Colon cancer Father 73       died at 22   Breast cancer Maternal Aunt    Breast cancer Maternal Aunt    Breast cancer Maternal Aunt    Rectal cancer Neg Hx    Stomach cancer Neg Hx    Crohn's disease Neg Hx    Esophageal cancer Neg Hx     Social History:  reports that she has never smoked. She has never used smokeless tobacco. She reports that she does not drink alcohol and does not use drugs.  Allergies:  Allergies  Allergen Reactions   Codeine Nausea And Vomiting  Medications: I have reviewed the patient's current medications. Scheduled:  acidophilus  1 capsule Oral Daily   calcium carbonate  1 tablet Oral BID WC   doxycycline  100 mg Oral Q12H   heparin injection (subcutaneous)  5,000 Units Subcutaneous Q12H   PARoxetine  25 mg Oral Daily    Results for orders placed or performed during the hospital encounter of 10/08/20 (from the past 24 hour(s))  Resp Panel by RT-PCR (Flu A&B, Covid) Nasopharyngeal Swab     Status: None   Collection Time: 10/08/20  1:57 PM   Specimen: Nasopharyngeal Swab; Nasopharyngeal(NP) swabs in vial transport medium  Result Value Ref Range   SARS Coronavirus 2 by RT PCR NEGATIVE NEGATIVE   Influenza A by PCR NEGATIVE NEGATIVE   Influenza B by PCR NEGATIVE NEGATIVE  C Difficile Quick Screen w PCR reflex      Status: None   Collection Time: 10/08/20  4:24 PM   Specimen: STOOL  Result Value Ref Range   C Diff antigen NEGATIVE NEGATIVE   C Diff toxin NEGATIVE NEGATIVE   C Diff interpretation No C. difficile detected.   Lactic acid, plasma     Status: Abnormal   Collection Time: 10/08/20  6:51 PM  Result Value Ref Range   Lactic Acid, Venous 3.0 (HH) 0.5 - 1.9 mmol/L  CBC     Status: Abnormal   Collection Time: 10/09/20  3:56 AM  Result Value Ref Range   WBC 4.3 4.0 - 10.5 K/uL   RBC 4.08 3.87 - 5.11 MIL/uL   Hemoglobin 12.6 12.0 - 15.0 g/dL   HCT 37.3 36.0 - 46.0 %   MCV 91.4 80.0 - 100.0 fL   MCH 30.9 26.0 - 34.0 pg   MCHC 33.8 30.0 - 36.0 g/dL   RDW 13.4 11.5 - 15.5 %   Platelets 145 (L) 150 - 400 K/uL   nRBC 0.0 0.0 - 0.2 %  Basic metabolic panel     Status: Abnormal   Collection Time: 10/09/20  3:56 AM  Result Value Ref Range   Sodium 132 (L) 135 - 145 mmol/L   Potassium 3.2 (L) 3.5 - 5.1 mmol/L   Chloride 104 98 - 111 mmol/L   CO2 19 (L) 22 - 32 mmol/L   Glucose, Bld 89 70 - 99 mg/dL   BUN 37 (H) 8 - 23 mg/dL   Creatinine, Ser 1.26 (H) 0.44 - 1.00 mg/dL   Calcium 8.4 (L) 8.9 - 10.3 mg/dL   GFR, Estimated 44 (L) >60 mL/min   Anion gap 9 5 - 15    X-ray: CLINICAL DATA:  Right foot infection. Evaluate for gas-forming organism.   EXAM: CT OF THE LOWER RIGHT EXTREMITY WITHOUT CONTRAST   TECHNIQUE: Multidetector CT imaging of the right lower extremity was performed according to the standard protocol.   COMPARISON:  Right foot x-rays from yesterday.   FINDINGS: Bones/Joint/Cartilage   No bony destruction or periosteal reaction. No fracture or dislocation. Joint spaces are preserved. No joint effusion.   Ligaments   Ligaments are suboptimally evaluated by CT.   Muscles and Tendons Grossly intact.   Soft tissue Scattered soft tissue swelling of the lower leg. Severe soft tissue swelling of the ankle and foot with several skin blisters noted. No subcutaneous  emphysema. No fluid collection or hematoma. No soft tissue mass.   IMPRESSION: 1. Severe soft tissue swelling of the ankle and foot with several skin blisters noted. No subcutaneous emphysema or discrete abscess. 2. No acute  osseous abnormality.     Electronically Signed   By: Titus Dubin M.D.  ROS: As per HPI  Blood pressure 125/79, pulse (!) 112, temperature 98.9 F (37.2 C), temperature source Oral, resp. rate 18, height '5\' 3"'$  (1.6 m), weight 70.5 kg, SpO2 96 %.  Physical Exam: GENERAL: Patient is alert awake and oriented. Not in obvious distress. HENT: No scleral pallor or icterus. Pupils equally reactive to light. Oral mucosa is moist NECK: is supple, no gross swelling noted. CHEST: Clear to auscultation. No crackles or wheezes.  Diminished breath sounds bilaterally. CVS: S1 and S2 heard, no murmur. Regular rate and rhythm.  ABDOMEN: Soft, non-tender, bowel sounds are present. EXTREMITIES right foot with significant blister with erythema, redness. CNS: Cranial nerves are intact. No focal motor deficits. SKIN: warm and dry, right foot blisters with erythema, redness and cellulitis                Assessment/Plan: Right foot cellulitis with likely reactive blood blister formation  Plan: Treatment of cellulitis per medical team  I will have the blister lanced to decompress in a more direct fashion as opposed to it tearing open We will apply zeroform to area and wrap with soft dressing No further treatment as she is on ABX to treat the cellulitis   Right foot blister lanced at bedside with immediate decompression with about 60-80 cc of clear, non infectious, non hemorrhagic fluid drained out Her right foot was dress with zeroform gauze and wrapped  Dressing changes daily as needed  Mauri Pole 10/09/2020, 12:22 PM

## 2020-10-09 NOTE — Plan of Care (Signed)

## 2020-10-10 ENCOUNTER — Telehealth: Payer: Self-pay | Admitting: *Deleted

## 2020-10-10 LAB — MAGNESIUM: Magnesium: 1.6 mg/dL — ABNORMAL LOW (ref 1.7–2.4)

## 2020-10-10 LAB — CBC
HCT: 33.9 % — ABNORMAL LOW (ref 36.0–46.0)
Hemoglobin: 11.3 g/dL — ABNORMAL LOW (ref 12.0–15.0)
MCH: 30.3 pg (ref 26.0–34.0)
MCHC: 33.3 g/dL (ref 30.0–36.0)
MCV: 90.9 fL (ref 80.0–100.0)
Platelets: 146 10*3/uL — ABNORMAL LOW (ref 150–400)
RBC: 3.73 MIL/uL — ABNORMAL LOW (ref 3.87–5.11)
RDW: 13.4 % (ref 11.5–15.5)
WBC: 5.5 10*3/uL (ref 4.0–10.5)
nRBC: 0 % (ref 0.0–0.2)

## 2020-10-10 LAB — PHOSPHORUS: Phosphorus: 1.5 mg/dL — ABNORMAL LOW (ref 2.5–4.6)

## 2020-10-10 LAB — BASIC METABOLIC PANEL
Anion gap: 7 (ref 5–15)
BUN: 35 mg/dL — ABNORMAL HIGH (ref 8–23)
CO2: 22 mmol/L (ref 22–32)
Calcium: 9.6 mg/dL (ref 8.9–10.3)
Chloride: 102 mmol/L (ref 98–111)
Creatinine, Ser: 0.78 mg/dL (ref 0.44–1.00)
GFR, Estimated: >60 mL/min (ref >60)
Glucose, Bld: 132 mg/dL — ABNORMAL HIGH (ref 70–99)
Potassium: 3.6 mmol/L (ref 3.5–5.1)
Sodium: 131 mmol/L — ABNORMAL LOW (ref 135–145)

## 2020-10-10 MED ORDER — POTASSIUM PHOSPHATES 15 MMOLE/5ML IV SOLN
30.0000 mmol | Freq: Once | INTRAVENOUS | Status: AC
  Administered 2020-10-10: 30 mmol via INTRAVENOUS
  Filled 2020-10-10: qty 10

## 2020-10-10 MED ORDER — MAGNESIUM SULFATE 2 GM/50ML IV SOLN
2.0000 g | Freq: Once | INTRAVENOUS | Status: AC
  Administered 2020-10-10: 2 g via INTRAVENOUS
  Filled 2020-10-10: qty 50

## 2020-10-10 NOTE — Progress Notes (Signed)
Informed Dr. Louanne Belton the pts. (Right) leg with rash on foot is more swollen than this nurse remembers from yesterday. Pt also has 2 new blister areas on the right foot above bandage and below bandage near toes. This nurse did circle them to make sure blisters do not get any bigger. Pt did get all of the Magnesium, did not get all of potassium. Pt upper arm started turning red and she complained of pain and discomfort and wanted it to be stopped.  It had flushed fine this morning but is beginning to swell, probably from the fluid.

## 2020-10-10 NOTE — Telephone Encounter (Signed)
Will await inpatient notes.

## 2020-10-10 NOTE — Telephone Encounter (Signed)
PLEASE NOTE: All timestamps contained within this report are represented as [place] Standard Time. CONFIDENTIALTY NOTICE: This fax transmission is intended only for the addressee. It contains information that is legally privileged, confidential or otherwise protected from use or disclosure. If you are not the intended recipient, you are strictly prohibited from reviewing, disclosing, copying using or disseminating any of this information or taking any action in reliance on or regarding this information. If you have received this fax in error, please notify us immediately by telephone so that we can arrange for its return to [place]. Phone: [phone], Toll-Free: [phone], Fax: [phone] Page: 1 of 1 Call Id: [id number] [place] Night - Client Nonclinical Telephone Record  [facility] Client [place] Night - Client Client Site [place] Physician [place] - PHYSICIAN, Verita Schneiders- MD Contact Type Call Who Is Calling Patient / Member / Family / Caregiver Caller Name [place] Phone Number [phone] Patient Name [name] Price Patient DOB [birth year] Call Type Message Only Information Provided Reason for Call Request for General Office Information Initial Comment Caller states, wanting office hours. Declined rn. n/a on md name. Pt has cellulitis in her foot to her knee. Disp. Time Disposition Final User 10/08/2020 7:32:36 AM General Information Provided Yes Lonia Farber Call Closed By: Lonia Farber Transaction Date/Time: 10/08/2020 7:30:20 AM (ET)

## 2020-10-10 NOTE — Evaluation (Signed)
Physical Therapy Evaluation Patient Details Name: [name] Price MRN: [id number] DOB: 09/17/1940 Today's Date: 10/10/2020   History of Present Illness  Pt is a 79 y/o female admitted 8/26 secondary to R foot cellulitis and early sepsis. PMH includes breast cancer and bilateral THA.  Clinical Impression  Pt admitted secondary to problem above with deficits below. Pt requiring min to min guard A for mobility tasks this session. Mobility limited to the room secondary to pain. Improved balance noted with use of RW. Educated about using WC for longer distances at home. Reports husband can provide supervision, but not able to provide much physical assist. Anticipate pt will progress well once pain is controlled and will be able to d/c home with HHPT. If pt does not progress well, will likely need SNF level therapies. Will continue to follow acutely.     Follow Up Recommendations Home health PT;Supervision/Assistance - 24 hour (pending progression)    Equipment Recommendations  Wheelchair cushion (measurements PT);Wheelchair (measurements PT)    Recommendations for Other Services       Precautions / Restrictions Precautions Precautions: Fall Restrictions Weight Bearing Restrictions: No      Mobility  Bed Mobility               General bed mobility comments: Sitting on BSC with NT upon entry    Transfers Overall transfer level: Needs assistance Equipment used: Rolling walker (2 wheeled);None Transfers: Sit to/from [employer] to Stand: Min assist;Min guard Stand pivot transfers: Min assist       General transfer comment: Required min A for steadying to stand and transfer to chair from [facility]. Performed standing using RW on second attempt and pt with much improved steadiness.  Ambulation/Gait Ambulation/Gait assistance: Min guard Gait Distance (Feet): 20 Feet Assistive device: Rolling walker (2 wheeled) Gait Pattern/deviations: Step-to pattern;Decreased  step length - left;Decreased step length - right;Decreased weight shift to right;Antalgic Gait velocity: Decreased   General Gait Details: Slow, antalgic gait. Tended to walk on her toe of the R foot at times. Min guard for safety. Further mobility limited secondary to pain.  Stairs            Wheelchair Mobility    Modified Rankin (Stroke Patients Only)       Balance Overall balance assessment: Needs assistance Sitting-balance support: No upper extremity supported;Feet supported Sitting balance-Leahy Scale: Fair     Standing balance support: Bilateral upper extremity supported;During functional activity Standing balance-Leahy Scale: Poor Standing balance comment: Reliant on BUE support                             Pertinent Vitals/Pain Pain Assessment: Faces Faces Pain Scale: Hurts even more Pain Location: R foot Pain Descriptors / Indicators: Grimacing;Guarding Pain Intervention(s): Limited activity within patient's tolerance;Monitored during session    Home Living Family/patient expects to be discharged to:: Private residence Living Arrangements: Spouse/significant other Available Help at Discharge: Family Type of Home: House Home Access: Stairs to enter Entrance Stairs-Rails: None Entrance Stairs-Number of Steps: 1 (threshold) Home Layout: One level Home Equipment: Grab bars - tub/shower;Shower seat;Walker - 2 wheels      Prior Function Level of Independence: Independent with assistive device(s)         Comments: Had been using RW secondary to pain     Hand Dominance        Extremity/Trunk Assessment   Upper Extremity Assessment Upper Extremity Assessment: Defer to OT evaluation  Lower Extremity Assessment Lower Extremity Assessment: RLE deficits/detail RLE Deficits / Details: R foot wrapped throughout. Noted bruising at toes and top of foot.    Cervical / Trunk Assessment Cervical / Trunk Assessment: Normal  Communication    Communication: No difficulties  Cognition Arousal/Alertness: Awake/alert Behavior During Therapy: WFL for tasks assessed/performed Overall Cognitive Status: No family/caregiver present to determine baseline cognitive functioning                                 General Comments: Pt reports seeing people that are not there when she closes her eyes. Notified RN. Reports they are not there when she opens her eyes.      General Comments General comments (skin integrity, edema, etc.): No family present    Exercises     Assessment/Plan    PT Assessment Patient needs continued PT services  PT Problem List Decreased strength;Decreased activity tolerance;Decreased balance;Decreased mobility;Decreased knowledge of use of DME;Pain       PT Treatment Interventions DME instruction;Gait training;Functional mobility training;Therapeutic activities;Therapeutic exercise;Balance training;Patient/family education;Stair training    PT Goals (Current goals can be found in the Care Plan section)  Acute Rehab PT Goals Patient Stated Goal: to decrease pain PT Goal Formulation: With patient Time For Goal Achievement: 10/24/20 Potential to Achieve Goals: Good    Frequency Min 3X/week   Barriers to discharge        Co-evaluation               AM-PAC PT "6 Clicks" Mobility  Outcome Measure Help needed turning from your back to your side while in a flat bed without using bedrails?: A Little Help needed moving from lying on your back to sitting on the side of a flat bed without using bedrails?: A Little Help needed moving to and from a bed to a chair (including a wheelchair)?: A Little Help needed standing up from a chair using your arms (e.g., wheelchair or bedside chair)?: A Little Help needed to walk in hospital room?: A Little Help needed climbing 3-5 steps with a railing? : A Lot 6 Click Score: 17    End of Session Equipment Utilized During Treatment: Gait belt Activity  Tolerance: Patient limited by pain Patient left: in chair;with call bell/phone within reach;with nursing/sitter in room Nurse Communication: Mobility status PT Visit Diagnosis: Unsteadiness on feet (R26.81);Difficulty in walking, not elsewhere classified (R26.2);Pain Pain - Right/Left: Right Pain - part of body: Ankle and joints of foot    Time: [id number] PT Time Calculation (min) (ACUTE ONLY): 15 min   Charges:   PT Evaluation $PT Eval Moderate Complexity: 1 Mod          Reuel Derby, PT, DPT  Acute Rehabilitation Services  Pager: [phone] Office: [phone]   Rudean Hitt 10/10/2020, 10:55 AM

## 2020-10-10 NOTE — Progress Notes (Addendum)
PROGRESS NOTE  [name] Price [id number] DOB: [birth year] DOA: 10/08/2020 PCP: Tonia Ghent, MD   LOS: 2 days   Brief narrative:  [name] Price is a 80 y.o. female with past medical history of anxiety, depression presented to the hospital with right foot rash and blister with new onset diarrhea.  Patient recently had some itching around the right foot and had been prescribed for triamcinolone for 2 weeks but continued to have rash and itchiness which progressed.  He went to see her dermatologist who prescribed topical dexamethasone for a week but started having blisters and painful rash without any fever.  She then came into the ED and DVT study was negative and was given Keflex but started having multiple episodes of watery diarrhea and increasing blister and redness.  Patient then presented back to the ED where she was noted to be mildly tachycardic with a temperature of around 99 F.  Lactate was elevated at 4.3 and creatinine elevated at 1.4 compared to 0.8.  CT scan of the foot did not show any evidence of foreign body but diffuse soft tissue swelling.  Patient was transferred to hospital for further evaluation and treatment.  Assessment/Plan:  Principal Problem:   AKI (acute kidney injury) ([place]) Active Problems:   Diarrhea  Right foot cellulitis with enlarging hemorrhagic large blister.   Currently on doxycycline.    DVT has been ruled out.  Seen by orthopedics and is status post drainage of blister.  Soakage noted.  Will need dressing.  CT of the lower extremities showing soft tissue swelling.  Early sepsis likely secondary to foot cellulitis. -Evidenced by tachycardia, and elevated lactate level, and early end organ damage of a new AKI. -Received 2.5 L of iv bolus and continue IVF at 150 ml/hr for 12 hour.  Blood cultures negative in 2 days.  Has normalized to 1.9.  Hypokalemia.  Improved with replacement.  Hypomagnesemia.  We will replenish IV and orally.  Check levels  in a.m.  Hypo-phosphatemia.  We will replenish through IV.  Check levels in a.m.  Acute kidney injury likely secondary to prerenal azotemia from severe diarrhea.  Improved.  Latest creatinine of 0.7   Diarrhea  C. difficile was negative.  Will treat symptomatically, feeling better.  Loperamide, continue probiotics while on antibiotic.  Weakness/debility.  Physical therapy recommended home PT on discharge.  DVT prophylaxis: heparin injection 5,000 Units Start: 10/08/20 2200  Code Status: Full code  Family Communication: Spoke with the patient's  spouse on the phone and updated about her clinical condition of the patient.  Status is: Inpatient  Remains inpatient appropriate because:Unsafe d/c plan, IV treatments appropriate due to intensity of illness or inability to take PO, and Inpatient level of care appropriate due to severity of illness  Dispo: The patient is from: Home              Anticipated d/c is to: Home health as per PT recommendation.              Patient currently is not medically stable to d/c.   Difficult to place patient No  Consultants: orthopedics  Procedures: Incision and drainage of the blister on 10/09/2020  Anti-infectives:  Doxycycline  Anti-infectives (From admission, onward)    Start     Dose/Rate Route Frequency Ordered Stop   10/08/20 2200  doxycycline (VIBRA-TABS) tablet 100 mg        100 mg Oral Every 12 hours 10/08/20 1704 10/15/20 2159   10/08/20 1408  vancomycin variable dose per unstable renal function (pharmacist dosing)  Status:  Discontinued         Does not apply See admin instructions 10/08/20 1408 10/08/20 1703   10/08/20 1400  vancomycin (VANCOREADY) IVPB 1500 mg/300 mL        1,500 mg 150 mL/hr over 120 Minutes Intravenous  Once 10/08/20 1358 10/08/20 1826   10/08/20 1400  cefTRIAXone (ROCEPHIN) 2 g in sodium chloride 0.9 % 100 mL IVPB        2 g 200 mL/hr over 30 Minutes Intravenous  Once 10/08/20 1358 10/08/20 1514       Subjective: Today, patient was seen and examined at bedside.  States that her diarrhea has improved including her pain in the foot.  Denies any fevers or chills.  Status post blister drainage.  Objective: Vitals:   10/09/20 2154 10/10/20 0421  BP: 126/76 140/76  Pulse: (!) 125 (!) 118  Resp: 20 20  Temp: 99.6 F (37.6 C) 98.3 F (36.8 C)  SpO2:  95%    Intake/Output Summary (Last 24 hours) at 10/10/2020 1105 Last data filed at 10/09/2020 1527 Gross per 24 hour  Intake 321.49 ml  Output --  Net 321.49 ml   Filed Weights   10/08/20 2043  Weight: 70.5 kg   Body mass index is 27.53 kg/m.   Physical Exam:  GENERAL: Patient is alert awake and oriented. Not in obvious distress. HENT: No scleral pallor or icterus. Pupils equally reactive to light. Oral mucosa is moist NECK: is supple, no gross swelling noted. CHEST: Clear to auscultation. No crackles or wheezes.  Diminished breath sounds bilaterally. CVS: S1 and S2 heard, no murmur. Regular rate and rhythm.  ABDOMEN: Soft, non-tender, bowel sounds are present. EXTREMITIES right foot with erythema, dressing noted with soakage., incised blister with intact skin. CNS: Cranial nerves are intact. No focal motor deficits. SKIN: warm and dry, right foot with incised blisters.  Dressing noted with some soakage.  On presentation,    Data Review: I have personally reviewed the following laboratory data and studies,  CBC: Recent Labs  Lab 10/07/20 0834 10/08/20 1110 10/09/20 0356 10/10/20 0301  WBC 16.9* 9.9 4.3 5.5  NEUTROABS 14.9* 8.4*  --   --   HGB 14.2 13.6 12.6 11.3*  HCT 42.7 40.3 37.3 33.9*  MCV 95.5 92.6 91.4 90.9  PLT 172 166 145* 146*    Basic Metabolic Panel: Recent Labs  Lab 10/07/20 0834 10/08/20 1110 10/09/20 0356 10/10/20 0301  NA 133* 136 132* 131*  K 3.6 3.6 3.2* 3.6  CL 101 103 104 102  CO2 23 20* 19* 22  GLUCOSE 126* 113* 89 132*  BUN 20 31* 37* 35*  CREATININE 0.86 1.48* 1.26* 0.78   CALCIUM 8.8* 8.7* 8.4* 9.6  MG  --   --   --  1.6*  PHOS  --   --   --  1.5*    Liver Function Tests: No results for input(s): AST, ALT, ALKPHOS, BILITOT, PROT, ALBUMIN in the last 168 hours. No results for input(s): LIPASE, AMYLASE in the last 168 hours. No results for input(s): AMMONIA in the last 168 hours. Cardiac Enzymes: No results for input(s): CKTOTAL, CKMB, CKMBINDEX, TROPONINI in the last 168 hours. BNP (last 3 results) No results for input(s): BNP in the last 8760 hours.  ProBNP (last 3 results) No results for input(s): PROBNP in the last 8760 hours.  CBG: No results for input(s): GLUCAP in the last 168 hours. Recent Results (  from the past 240 hour(s))  Resp Panel by RT-PCR (Flu A&B, Covid) Nasopharyngeal Swab     Status: None   Collection Time: 10/08/20  1:57 PM   Specimen: Nasopharyngeal Swab; Nasopharyngeal(NP) swabs in vial transport medium  Result Value Ref Range Status   SARS Coronavirus 2 by RT PCR NEGATIVE NEGATIVE Final    Comment: (NOTE) SARS-CoV-2 target nucleic acids are NOT DETECTED.  The SARS-CoV-2 RNA is generally detectable in upper respiratory specimens during the acute phase of infection. The lowest concentration of SARS-CoV-2 viral copies this assay can detect is 138 copies/mL. A negative result does not preclude SARS-Cov-2 infection and should not be used as the sole basis for treatment or other patient management decisions. A negative result may occur with  improper specimen collection/handling, submission of specimen other than nasopharyngeal swab, presence of viral mutation(s) within the areas targeted by this assay, and inadequate number of viral copies(<138 copies/mL). A negative result must be combined with clinical observations, patient history, and epidemiological information. The expected result is Negative.  Fact Sheet for Patients:  EntrepreneurPulse.com.au  Fact Sheet for Healthcare Providers:   IncredibleEmployment.be  This test is no t yet approved or cleared by the [place] FDA and  has been authorized for detection and/or diagnosis of SARS-CoV-2 by FDA under an Emergency Use Authorization (EUA). This EUA will remain  in effect (meaning this test can be used) for the duration of the COVID-19 declaration under Section 564(b)(1) of the Act, 21 U.S.C.section 360bbb-3(b)(1), unless the authorization is terminated  or revoked sooner.       Influenza A by PCR NEGATIVE NEGATIVE Final   Influenza B by PCR NEGATIVE NEGATIVE Final    Comment: (NOTE) The Xpert Xpress SARS-CoV-2/FLU/RSV plus assay is intended as an aid in the diagnosis of influenza from Nasopharyngeal swab specimens and should not be used as a sole basis for treatment. Nasal washings and aspirates are unacceptable for Xpert Xpress SARS-CoV-2/FLU/RSV testing.  Fact Sheet for Patients: EntrepreneurPulse.com.au  Fact Sheet for Healthcare Providers: IncredibleEmployment.be  This test is not yet approved or cleared by the [place] FDA and has been authorized for detection and/or diagnosis of SARS-CoV-2 by FDA under an Emergency Use Authorization (EUA). This EUA will remain in effect (meaning this test can be used) for the duration of the COVID-19 declaration under Section 564(b)(1) of the Act, 21 U.S.C. section 360bbb-3(b)(1), unless the authorization is terminated or revoked.  Performed at [facility], [place] [address], [place], [place] [address]   Blood Culture (routine x 2)     Status: None (Preliminary result)   Collection Time: 10/08/20  2:05 PM   Specimen: BLOOD  Result Value Ref Range Status   Specimen Description BLOOD LEFT ANTECUBITAL  Final   Special Requests   Final    BOTTLES DRAWN AEROBIC AND ANAEROBIC Blood Culture adequate volume   Culture   Final    NO GROWTH 2 DAYS Performed at [facility], [place] [address]  [place], [place] [address]    Report Status PENDING  Incomplete  Blood Culture (routine x 2)     Status: None (Preliminary result)   Collection Time: 10/08/20  2:14 PM   Specimen: BLOOD  Result Value Ref Range Status   Specimen Description BLOOD SITE NOT SPECIFIED  Final   Special Requests   Final    BOTTLES DRAWN AEROBIC AND ANAEROBIC Blood Culture results may not be optimal due to an inadequate volume of blood received in culture  bottles   Culture   Final    NO GROWTH 2 DAYS Performed at [facility], [place] [address] [place], [place] [address]    Report Status PENDING  Incomplete  C Difficile Quick Screen w PCR reflex     Status: None   Collection Time: 10/08/20  4:24 PM   Specimen: STOOL  Result Value Ref Range Status   C Diff antigen NEGATIVE NEGATIVE Final   C Diff toxin NEGATIVE NEGATIVE Final   C Diff interpretation No C. difficile detected.  Final    Comment: Performed at [facility], [place] [address], [place], [place] [address]      Studies: DG Chest Port 1 View  Result Date: 10/08/2020 CLINICAL DATA:  Questionable sepsis - evaluate for abnormality EXAM: PORTABLE CHEST 1 VIEW COMPARISON:  November 01, 2006 FINDINGS: Low lung volume film which limits evaluation. Mildly enlarged cardiomediastinal silhouette with irregular margins of the RIGHT perihilar border. Elevation of the RIGHT hemidiaphragm. No pleural effusion. No pneumothorax. Minimal LEFT retrocardiac opacity, likely atelectasis. RIGHT basilar linear opacities, likely atelectasis. Visualized abdomen is unremarkable. Status post LEFT shoulder arthroplasty. IMPRESSION: 1. Low lung volume film limits evaluation. There is irregular contour of the RIGHT perihilar border. This could be due to inspiratory effort. Recommend follow-up PA and lateral chest radiograph versus chest CT for improved evaluation. 2. Bibasilar linear opacities, likely atelectasis. Differential considerations include infection. Electronically Signed    By: Valentino Saxon M.D.   On: 10/08/2020 14:24   CT EXTREMITY LOWER RIGHT WO CONTRAST  Result Date: 10/08/2020 CLINICAL DATA:  Right foot infection. Evaluate for gas-forming organism. EXAM: CT OF THE LOWER RIGHT EXTREMITY WITHOUT CONTRAST TECHNIQUE: Multidetector CT imaging of the right lower extremity was performed according to the standard protocol. COMPARISON:  Right foot x-rays from yesterday. FINDINGS: Bones/Joint/Cartilage No bony destruction or periosteal reaction. No fracture or dislocation. Joint spaces are preserved. No joint effusion. Ligaments Ligaments are suboptimally evaluated by CT. Muscles and Tendons Grossly intact. Soft tissue Scattered soft tissue swelling of the lower leg. Severe soft tissue swelling of the ankle and foot with several skin blisters noted. No subcutaneous emphysema. No fluid collection or hematoma. No soft tissue mass. IMPRESSION: 1. Severe soft tissue swelling of the ankle and foot with several skin blisters noted. No subcutaneous emphysema or discrete abscess. 2. No acute osseous abnormality. Electronically Signed   By: Titus Dubin M.D.   On: 10/08/2020 15:24      Flora Lipps, MD  [facility] 10/10/2020  If 7PM-7AM, please contact night-coverage

## 2020-10-10 NOTE — Plan of Care (Signed)
  Problem: Education: Goal: Knowledge of General Education information will improve Description Including pain rating scale, medication(s)/side effects and non-pharmacologic comfort measures Outcome: Progressing   Problem: Health Behavior/Discharge Planning: Goal: Ability to manage health-related needs will improve Outcome: Progressing   

## 2020-10-10 NOTE — Telephone Encounter (Signed)
Patient currently is in the hospital.

## 2020-10-10 NOTE — TOC Initial Note (Signed)
Transition of Care [facility]) - Initial/Assessment Note    Patient Details  Name: [name] Price MRN: [id number] Date of Birth: 09/19/1940  Transition of Care [facility]) CM/SW Contact:    Joanne Chars, LCSW Phone Number: 10/10/2020, 2:02 PM  Clinical Narrative: CSW met with pt regarding recommendation for [facility].  Pt agreeable, choice document given, no agency preference indicated.  Permission given to speak with husband Truman Hayward and daughters Moshe Salisbury and [place].  Current DME in home: walker.  PCP in place.  Pt is vaccinated for covid with both boosters.                    Expected Discharge Plan: [place] Barriers to Discharge: Continued Medical Work up   Patient Goals and CMS Choice Patient states their goals for this hospitalization and ongoing recovery are:: "be able to walk to the bathroom" CMS Medicare.gov Compare Post Acute Care list provided to:: Patient Choice offered to / list presented to : Patient  Expected Discharge Plan and Services Expected Discharge Plan: [place] Choice: [place] arrangements for the past 2 months: Single Family Home                                      Prior Living Arrangements/Services Living arrangements for the past 2 months: Single Family Home Lives with:: Spouse Patient language and need for interpreter reviewed:: Yes Do you feel safe going back to the place where you live?: Yes      Need for Family Participation in Patient Care: Yes (Comment) Care giver support system in place?: Yes (comment) Current home services: Other (comment) (na) Criminal Activity/Legal Involvement Pertinent to Current Situation/Hospitalization: No - Comment as needed  Activities of Daily Living Home Assistive Devices/Equipment: Walker (specify type) ADL Screening (condition at time of admission) Patient's cognitive ability adequate to safely complete daily activities?: Yes Is the patient deaf  or have difficulty hearing?: No Does the patient have difficulty seeing, even when wearing glasses/contacts?: No Does the patient have difficulty concentrating, remembering, or making decisions?: No Patient able to express need for assistance with ADLs?: Yes Does the patient have difficulty dressing or bathing?: No Independently performs ADLs?: No Communication: Independent Dressing (OT): Independent Grooming: Independent Feeding: Independent Bathing: Needs assistance Is this a change from baseline?: Change from baseline, expected to last <3 days Toileting: Needs assistance Is this a change from baseline?: Change from baseline, expected to last <3 days In/Out Bed: Needs assistance Is this a change from baseline?: Change from baseline, expected to last <3 days Walks in Home: Independent with device (comment) Does the patient have difficulty walking or climbing stairs?: Yes Weakness of Legs: Right Weakness of Arms/Hands: None  Permission Sought/Granted Permission sought to share information with : Family Supports Permission granted to share information with : Yes, Verbal Permission Granted  Share Information with NAME: husband Truman Hayward, daughters Moshe Salisbury and Pamala Hurry  Permission granted to share info w AGENCY: HH        Emotional Assessment Appearance:: Appears stated age Attitude/Demeanor/Rapport: Engaged Affect (typically observed): Appropriate, Pleasant Orientation: :  (not recorded) Alcohol / Substance Use: Not Applicable Psych Involvement: No (comment)  Admission diagnosis:  Cellulitis of right lower extremity [L03.115] AKI (acute kidney injury) ([place]) [N17.9] Diarrhea, unspecified type [R19.7] Sepsis, due to unspecified organism, unspecified whether acute organ dysfunction present [facility]) [  A41.9] Patient Active Problem List   Diagnosis Date Noted   AKI (acute kidney injury) ([place]) 10/08/2020   Sepsis ([place])    Diarrhea    Rash 09/05/2020   History of hip replacement    Advance  care planning 06/25/2017   Health care maintenance 06/25/2017   Hot flashes 06/25/2017   BPV (benign positional vertigo) 03/15/2015   Ganglion cyst of wrist 02/23/2015   Extensor tendon rupture of hand 02/01/2015   Left wrist effusion 01/26/2015   KNEE PAIN, RIGHT 11/13/2007   CHONDROMALACIA PATELLA, LEFT 03/19/2007   Osteoporosis 03/19/2007   HEMATURIA, HX OF 03/19/2007   DIVERTICULOSIS, COLON 08/10/2002   HYPERCHOLESTEROLEMIA 06/05/1996   PCP:  Tonia Ghent, MD Pharmacy:   [id number] [place] [address] [place]Phone: [phone]: [phone]    Social Determinants of Health (SDOH) Interventions    Readmission Risk Interventions No flowsheet data found.

## 2020-10-11 LAB — PHOSPHORUS: Phosphorus: 1.5 mg/dL — ABNORMAL LOW (ref 2.5–4.6)

## 2020-10-11 LAB — CBC
HCT: 31.6 % — ABNORMAL LOW (ref 36.0–46.0)
Hemoglobin: 11 g/dL — ABNORMAL LOW (ref 12.0–15.0)
MCH: 31.3 pg (ref 26.0–34.0)
MCHC: 34.8 g/dL (ref 30.0–36.0)
MCV: 89.8 fL (ref 80.0–100.0)
Platelets: 146 10*3/uL — ABNORMAL LOW (ref 150–400)
RBC: 3.52 MIL/uL — ABNORMAL LOW (ref 3.87–5.11)
RDW: 13.5 % (ref 11.5–15.5)
WBC: 8.2 10*3/uL (ref 4.0–10.5)
nRBC: 0 % (ref 0.0–0.2)

## 2020-10-11 LAB — BASIC METABOLIC PANEL
Anion gap: 6 (ref 5–15)
BUN: 24 mg/dL — ABNORMAL HIGH (ref 8–23)
CO2: 26 mmol/L (ref 22–32)
Calcium: 9.4 mg/dL (ref 8.9–10.3)
Chloride: 102 mmol/L (ref 98–111)
Creatinine, Ser: 0.6 mg/dL (ref 0.44–1.00)
GFR, Estimated: >60 mL/min (ref >60)
Glucose, Bld: 149 mg/dL — ABNORMAL HIGH (ref 70–99)
Potassium: 3.4 mmol/L — ABNORMAL LOW (ref 3.5–5.1)
Sodium: 134 mmol/L — ABNORMAL LOW (ref 135–145)

## 2020-10-11 LAB — MAGNESIUM: Magnesium: 1.7 mg/dL (ref 1.7–2.4)

## 2020-10-11 MED ORDER — K PHOS MONO-SOD PHOS DI & MONO 155-852-130 MG PO TABS
500.0000 mg | ORAL_TABLET | Freq: Three times a day (TID) | ORAL | Status: AC
  Administered 2020-10-11 (×3): 500 mg via ORAL
  Filled 2020-10-11 (×3): qty 2

## 2020-10-11 MED ORDER — CEFAZOLIN SODIUM-DEXTROSE 1-4 GM/50ML-% IV SOLN
1.0000 g | Freq: Three times a day (TID) | INTRAVENOUS | Status: DC
  Administered 2020-10-11 – 2020-10-12 (×3): 1 g via INTRAVENOUS
  Filled 2020-10-11 (×4): qty 50

## 2020-10-11 MED ORDER — METHYLPREDNISOLONE SODIUM SUCC 40 MG IJ SOLR
40.0000 mg | Freq: Every day | INTRAMUSCULAR | Status: DC
  Administered 2020-10-11 – 2020-10-12 (×2): 40 mg via INTRAVENOUS
  Filled 2020-10-11 (×2): qty 1

## 2020-10-11 MED ORDER — METHYLPREDNISOLONE SODIUM SUCC 125 MG IJ SOLR
INTRAMUSCULAR | Status: AC
  Filled 2020-10-11: qty 2

## 2020-10-11 MED ORDER — MAGNESIUM OXIDE -MG SUPPLEMENT 400 (240 MG) MG PO TABS
400.0000 mg | ORAL_TABLET | Freq: Two times a day (BID) | ORAL | Status: DC
  Administered 2020-10-11 – 2020-10-19 (×17): 400 mg via ORAL
  Filled 2020-10-11 (×17): qty 1

## 2020-10-11 NOTE — Progress Notes (Signed)
PROGRESS NOTE  SWEET WESTHAFER [id number] DOB: 09-Jan-1941 DOA: 10/08/2020 PCP: Tonia Ghent, MD   LOS: 3 days   Brief narrative:  [name] Price is a 80 y.o. female with past medical history of anxiety, depression presented to the hospital with right foot rash and blister with new onset diarrhea.  Patient recently had some itching around the right foot and had been prescribed for triamcinolone for 2 weeks but continued to have rash and itchiness which progressed.  He went to see her dermatologist who prescribed topical dexamethasone for a week but started having blisters and painful rash without any fever.  She then came into the ED and DVT study was negative and was given Keflex but started having multiple episodes of watery diarrhea and increasing blister and redness.  Patient then presented back to the ED where she was noted to be mildly tachycardic with a temperature of around 99 F.  Lactate was elevated at 4.3 and creatinine elevated at 1.4 compared to 0.8.  CT scan of the foot did not show any evidence of foreign body but diffuse soft tissue swelling.  Patient was transferred to hospital for further evaluation and treatment.  Assessment/Plan:  Principal Problem:   AKI (acute kidney injury) ([place]) Active Problems:   Diarrhea  Right foot cellulitis with enlarging hemorrhagic large blister.   Currently on doxycycline.  Increasing redness swelling and tenderness of the foot with increasing erythema so will change to cefazolin starting today.  Patient stated that she was in the garden and had poison ivy in the back yard.  She does not have a significant fever or leukocytosis so will consider IV Solu-Medrol for now to see the progress.  VT has been ruled out.  Seen by orthopedics and is status post drainage of blister.  Continue dressing as needed.   CT of the lower extremities showing soft tissue swelling.  Early sepsis likely secondary to foot cellulitis. Evidenced by tachycardia,  and elevated lactate level, and early end organ damage of a new AKI.  Latest creatinine of 0.6.  Blood cultures negative in 3 days.    Hypokalemia.  Improved with replacement.  Latest potassium at 3.4.  We will continue to replenish.  Continue K-Phos.  Hypomagnesemia.   Improved after replacement.  Continue K-Phos  Hypo-phosphatemia.  Replenished through IV.  Continue replacement.  Acute kidney injury likely secondary to prerenal azotemia from severe diarrhea.  Improved.  Latest creatinine of 0.6   Diarrhea  C. difficile was negative.  On loperamide, continue probiotics while on antibiotic.  Weakness/debility.  Physical therapy recommended home PT on discharge.  DVT prophylaxis: heparin injection 5,000 Units Start: 10/08/20 2200  Code Status: Full code  Family Communication:  None today.  Spoke with the patient's  spouse on the phone on 10/10/2020.  Status is: Inpatient  Remains inpatient appropriate because:IV treatments appropriate due to intensity of illness or inability to take PO, and Inpatient level of care appropriate due to severity of illness  Dispo: The patient is from: Home              Anticipated d/c is to: Home health as per PT recommendation.              Patient currently is not medically stable to d/c.   Difficult to place patient No  Consultants: orthopedics  Procedures: Incision and drainage of the blister on 10/09/2020  Anti-infectives:  Doxycycline, will change to  cefazolin.  Anti-infectives (From admission, onward)  Start     Dose/Rate Route Frequency Ordered Stop   10/11/20 1400  ceFAZolin (ANCEF) IVPB 1 g/50 mL premix        1 g 100 mL/hr over 30 Minutes Intravenous Every 8 hours 10/11/20 0902     10/08/20 2200  doxycycline (VIBRA-TABS) tablet 100 mg  Status:  Discontinued        100 mg Oral Every 12 hours 10/08/20 1704 10/11/20 0902   10/08/20 1408  vancomycin variable dose per unstable renal function (pharmacist dosing)  Status:   Discontinued         Does not apply See admin instructions 10/08/20 1408 10/08/20 1703   10/08/20 1400  vancomycin (VANCOREADY) IVPB 1500 mg/300 mL        1,500 mg 150 mL/hr over 120 Minutes Intravenous  Once 10/08/20 1358 10/08/20 1826   10/08/20 1400  cefTRIAXone (ROCEPHIN) 2 g in sodium chloride 0.9 % 100 mL IVPB        2 g 200 mL/hr over 30 Minutes Intravenous  Once 10/08/20 1358 10/08/20 1514      Subjective:  Today, Patient was seen and examined at bedside. Has decreased pain in the foot but had small blisters,.  As per the nursing staff, the redness and swelling in the foot has increased.  Temperature max of 99.7 F  Objective: Vitals:   10/10/20 2020 10/11/20 0350  BP: (!) 146/78 (!) 142/78  Pulse: (!) 122 97  Resp: 20 18  Temp: 99.7 F (37.6 C) 98.5 F (36.9 C)  SpO2: 92% 97%    Intake/Output Summary (Last 24 hours) at 10/11/2020 1037 Last data filed at 10/10/2020 1500 Gross per 24 hour  Intake 158.74 ml  Output --  Net 158.74 ml    Filed Weights   10/08/20 2043  Weight: 70.5 kg   Body mass index is 27.53 kg/m.   Physical Exam:  General:  Average built, not in obvious distress HENT:   No scleral pallor or icterus noted. Oral mucosa is moist.  Chest:  Clear breath sounds.  Diminished breath sounds bilaterally. No crackles or wheezes.  CVS: S1 &S2 heard. No murmur.  Regular rate and rhythm. Abdomen: Soft, nontender, nondistended.  Bowel sounds are heard.   Extremities: Warm and dry, right foot with incised blisters, dressing noted with soakage, new small blisters noted as well.   Psych: Alert, awake and oriented, normal mood CNS:  No cranial nerve deficits.  Power equal in all extremities.   Skin: Warm and dry.  No rashes noted. On presentation,    Data Review: I have personally reviewed the following laboratory data and studies,  CBC: Recent Labs  Lab 10/07/20 0834 10/08/20 1110 10/09/20 0356 10/10/20 0301 10/11/20 0215  WBC 16.9* 9.9 4.3 5.5  8.2  NEUTROABS 14.9* 8.4*  --   --   --   HGB 14.2 13.6 12.6 11.3* 11.0*  HCT 42.7 40.3 37.3 33.9* 31.6*  MCV 95.5 92.6 91.4 90.9 89.8  PLT 172 166 145* 146* 146*    Basic Metabolic Panel: Recent Labs  Lab 10/07/20 0834 10/08/20 1110 10/09/20 0356 10/10/20 0301 10/11/20 0215  NA 133* 136 132* 131* 134*  K 3.6 3.6 3.2* 3.6 3.4*  CL 101 103 104 102 102  CO2 23 20* 19* 22 26  GLUCOSE 126* 113* 89 132* 149*  BUN 20 31* 37* 35* 24*  CREATININE 0.86 1.48* 1.26* 0.78 0.60  CALCIUM 8.8* 8.7* 8.4* 9.6 9.4  MG  --   --   --  1.6* 1.7  PHOS  --   --   --  1.5* 1.5*    Liver Function Tests: No results for input(s): AST, ALT, ALKPHOS, BILITOT, PROT, ALBUMIN in the last 168 hours. No results for input(s): LIPASE, AMYLASE in the last 168 hours. No results for input(s): AMMONIA in the last 168 hours. Cardiac Enzymes: No results for input(s): CKTOTAL, CKMB, CKMBINDEX, TROPONINI in the last 168 hours. BNP (last 3 results) No results for input(s): BNP in the last 8760 hours.  ProBNP (last 3 results) No results for input(s): PROBNP in the last 8760 hours.  CBG: No results for input(s): GLUCAP in the last 168 hours. Recent Results (from the past 240 hour(s))  Resp Panel by RT-PCR (Flu A&B, Covid) Nasopharyngeal Swab     Status: None   Collection Time: 10/08/20  1:57 PM   Specimen: Nasopharyngeal Swab; Nasopharyngeal(NP) swabs in vial transport medium  Result Value Ref Range Status   SARS Coronavirus 2 by RT PCR NEGATIVE NEGATIVE Final    Comment: (NOTE) SARS-CoV-2 target nucleic acids are NOT DETECTED.  The SARS-CoV-2 RNA is generally detectable in upper respiratory specimens during the acute phase of infection. The lowest concentration of SARS-CoV-2 viral copies this assay can detect is 138 copies/mL. A negative result does not preclude SARS-Cov-2 infection and should not be used as the sole basis for treatment or other patient management decisions. A negative result may occur  with  improper specimen collection/handling, submission of specimen other than nasopharyngeal swab, presence of viral mutation(s) within the areas targeted by this assay, and inadequate number of viral copies(<138 copies/mL). A negative result must be combined with clinical observations, patient history, and epidemiological information. The expected result is Negative.  Fact Sheet for Patients:  EntrepreneurPulse.com.au  Fact Sheet for Healthcare Providers:  IncredibleEmployment.be  This test is no t yet approved or cleared by the [place] FDA and  has been authorized for detection and/or diagnosis of SARS-CoV-2 by FDA under an Emergency Use Authorization (EUA). This EUA will remain  in effect (meaning this test can be used) for the duration of the COVID-19 declaration under Section 564(b)(1) of the Act, 21 U.S.C.section 360bbb-3(b)(1), unless the authorization is terminated  or revoked sooner.       Influenza A by PCR NEGATIVE NEGATIVE Final   Influenza B by PCR NEGATIVE NEGATIVE Final    Comment: (NOTE) The Xpert Xpress SARS-CoV-2/FLU/RSV plus assay is intended as an aid in the diagnosis of influenza from Nasopharyngeal swab specimens and should not be used as a sole basis for treatment. Nasal washings and aspirates are unacceptable for Xpert Xpress SARS-CoV-2/FLU/RSV testing.  Fact Sheet for Patients: EntrepreneurPulse.com.au  Fact Sheet for Healthcare Providers: IncredibleEmployment.be  This test is not yet approved or cleared by the [place] FDA and has been authorized for detection and/or diagnosis of SARS-CoV-2 by FDA under an Emergency Use Authorization (EUA). This EUA will remain in effect (meaning this test can be used) for the duration of the COVID-19 declaration under Section 564(b)(1) of the Act, 21 U.S.C. section 360bbb-3(b)(1), unless the authorization is terminated  or revoked.  Performed at [facility], [place] [address], [place], [place] [address]   Blood Culture (routine x 2)     Status: None (Preliminary result)   Collection Time: 10/08/20  2:05 PM   Specimen: BLOOD  Result Value Ref Range Status   Specimen Description BLOOD LEFT ANTECUBITAL  Final   Special Requests   Final    BOTTLES DRAWN  AEROBIC AND ANAEROBIC Blood Culture adequate volume   Culture   Final    NO GROWTH 3 DAYS Performed at [facility], [place] [address] [place], [place] [address]    Report Status PENDING  Incomplete  Blood Culture (routine x 2)     Status: None (Preliminary result)   Collection Time: 10/08/20  2:14 PM   Specimen: BLOOD  Result Value Ref Range Status   Specimen Description BLOOD SITE NOT SPECIFIED  Final   Special Requests   Final    BOTTLES DRAWN AEROBIC AND ANAEROBIC Blood Culture results may not be optimal due to an inadequate volume of blood received in culture bottles   Culture   Final    NO GROWTH 3 DAYS Performed at [facility], [place] [address], [place], [place] [address]    Report Status PENDING  Incomplete  C Difficile Quick Screen w PCR reflex     Status: None   Collection Time: 10/08/20  4:24 PM   Specimen: STOOL  Result Value Ref Range Status   C Diff antigen NEGATIVE NEGATIVE Final   C Diff toxin NEGATIVE NEGATIVE Final   C Diff interpretation No C. difficile detected.  Final    Comment: Performed at [facility], [place] [address], [place], [place] [address]      Studies: No results found.    Flora Lipps, MD  [facility] 10/11/2020  If 7PM-7AM, please contact night-coverage

## 2020-10-11 NOTE — Evaluation (Signed)
Occupational Therapy Evaluation Patient Details Name: [name] Price MRN: [id number] DOB: [birth year] Today's Date: 10/11/2020    History of Present Illness Pt is a 80 y/o female admitted 8/26 secondary to R foot cellulitis and early sepsis. PMH includes breast cancer and bilateral THA.   Clinical Impression   PTA patient was living with her spouse in a private residence and was grossly I with ADLs/IADLs without AD. Patient reports since onset of RLE pain she has required a RW for mobility and has been unable to drive. Patient currently functioning below baseline demonstrating observed ADLs including LB dressing and simulated toilet transfers with with Min to Mod A respectively. Patient also limited by deficits listed below including muscle weakness, decreased standing balance and decreased activity tolerance and would benefit from continued acute OT services in prep for safe d/c home. OT will continue to follow acutely.      Follow Up Recommendations  Home health OT;Supervision/Assistance - 24 hour    Equipment Recommendations  3 in 1 bedside commode    Recommendations for Other Services       Precautions / Restrictions Precautions Precautions: Fall Restrictions Weight Bearing Restrictions: No      Mobility Bed Mobility Overal bed mobility: Needs Assistance Bed Mobility: Supine to Sit     Supine to sit: Supervision;HOB elevated     General bed mobility comments: Able to advance BLE toward EOB and elevate trunk with min use of bed rail.    Transfers Overall transfer level: Needs assistance Equipment used: Rolling walker (2 wheeled);None Transfers: Sit to/from [employer] to Stand: Min assist;Min guard Stand pivot transfers: Min assist       General transfer comment: Required min A for steadying to stand and transfer to chair from [facility]. Patient with preference for TDWB on RLE 2/2 pain.    Balance Overall balance assessment: Needs  assistance Sitting-balance support: No upper extremity supported;Feet supported Sitting balance-Leahy Scale: Fair     Standing balance support: Bilateral upper extremity supported;During functional activity Standing balance-Leahy Scale: Poor Standing balance comment: Reliant on BUE support                           ADL either performed or assessed with clinical judgement   ADL Overall ADL's : Needs assistance/impaired     Grooming: Set up;Sitting               Lower Body Dressing: Moderate assistance;Sit to/from stand Lower Body Dressing Details (indicate cue type and reason): Max A to don R sock seated EOB. Toilet Transfer: Minimal Insurance claims handler Details (indicate cue type and reason): Simulated with stand-pivot to recliner on R with use of RW.           General ADL Comments: Patient greatly limited by swelling and pain in RLE.     Vision Baseline Vision/History: 1 Wears glasses Ability to See in Adequate Light: 0 Adequate Patient Visual Report: No change from baseline Vision Assessment?: No apparent visual deficits     Perception     Praxis      Pertinent Vitals/Pain Pain Assessment: 0-10 Pain Score: 3  Pain Location: R foot Pain Descriptors / Indicators: Grimacing;Guarding Pain Intervention(s): Limited activity within patient's tolerance;Monitored during session;Repositioned     Hand Dominance Right   Extremity/Trunk Assessment Upper Extremity Assessment Upper Extremity Assessment: Generalized weakness       Cervical / Trunk Assessment Cervical / Trunk Assessment: Normal   Communication  Communication Communication: No difficulties   Cognition Arousal/Alertness: Awake/alert Behavior During Therapy: WFL for tasks assessed/performed Overall Cognitive Status: Within Functional Limits for tasks assessed                                     General Comments  Gauze wrapped R foot; drainage present on bed pad;  RN aware. RLE edematous throughout and warm to the touch. MD present at conclusion of session to assess.    Exercises     Shoulder Instructions      Home Living Family/patient expects to be discharged to:: Private residence Living Arrangements: Spouse/significant other Available Help at Discharge: Family Type of Home: House Home Access: Stairs to enter [employer] of Steps: 1 (threshold) Entrance Stairs-Rails: None Home Layout: One level     Bathroom Shower/Tub: Occupational psychologist: Handicapped height     Home Equipment: Grab bars - tub/shower;Shower seat;Walker - 2 wheels          Prior Functioning/Environment Level of Independence: Independent with assistive device(s)        Comments: Had been using RW secondary to pain; drives; I with ADLs/IADLs        OT Problem List: Decreased strength;Impaired balance (sitting and/or standing);Decreased knowledge of use of DME or AE;Pain;Increased edema      OT Treatment/Interventions: Self-care/ADL training;Therapeutic exercise;Energy conservation;DME and/or AE instruction;Therapeutic activities;Patient/family education;Balance training    OT Goals(Current goals can be found in the care plan section) Acute Rehab OT Goals Patient Stated Goal: to decrease pain OT Goal Formulation: With patient Time For Goal Achievement: 10/25/20 Potential to Achieve Goals: Good ADL Goals Pt Will Perform Grooming: with modified independence;standing Pt Will Perform Upper Body Dressing: Independently Pt Will Perform Lower Body Dressing: with modified independence;sit to/from stand Pt Will Transfer to Toilet: with modified independence;ambulating Pt Will Perform Toileting - Clothing Manipulation and hygiene: with modified independence;sit to/from stand Pt Will Perform Tub/Shower Transfer: Tub transfer;Shower transfer;tub bench;rolling walker  OT Frequency: Min 2X/week   Barriers to D/C:            Co-evaluation               AM-PAC OT "6 Clicks" Daily Activity     Outcome Measure Help from another person eating meals?: None Help from another person taking care of personal grooming?: A Little Help from another person toileting, which includes using toliet, bedpan, or urinal?: A Little Help from another person bathing (including washing, rinsing, drying)?: A Little Help from another person to put on and taking off regular upper body clothing?: None Help from another person to put on and taking off regular lower body clothing?: A Little 6 Click Score: 20   End of Session Equipment Utilized During Treatment: Gait belt;Rolling walker Nurse Communication: Mobility status  Activity Tolerance: Patient tolerated treatment well Patient left: in chair;with call bell/phone within reach;with nursing/sitter in room  OT Visit Diagnosis: Unsteadiness on feet (R26.81);Muscle weakness (generalized) (M62.81);Pain Pain - Right/Left: Right Pain - part of body: Leg;Ankle and joints of foot                Time: [id number] OT Time Calculation (min): 20 min Charges:  OT General Charges $OT Visit: 1 Visit OT Evaluation $OT Eval Low Complexity: 1 Low  Tomorrow Dehaas H. OTR/L Supplemental OT, Department of rehab services [phone]  [name] Price R H. 10/11/2020, 7:45 AM

## 2020-10-11 NOTE — Plan of Care (Signed)
  Problem: Health Behavior/Discharge Planning: Goal: Ability to manage health-related needs will improve Outcome: Progressing   Problem: Activity: Goal: Risk for activity intolerance will decrease Outcome: Progressing   Problem: Nutrition: Goal: Adequate nutrition will be maintained Outcome: Progressing   

## 2020-10-11 NOTE — Progress Notes (Signed)
Physical Therapy Treatment Patient Details Name: [name] Price MRN: [id number] DOB: 28-Apr-1940 Today's Date: 10/11/2020    History of Present Illness Pt is a 80 y/o female admitted 8/26 secondary to R foot cellulitis and early sepsis. PMH includes breast cancer and bilateral THA.    PT Comments    Pt tolerated treatment well with VSS throughout (HR 89-108). Pt demonstrated increased ambulation distance compared to previous session. Updated equipment recommendations, as pt ambulated 125' and requires a boost to stand. Continue to recommend HHPT with supervision and assistance to stand.     Follow Up Recommendations  Home health PT;Supervision/Assistance - 24 hour     Equipment Recommendations  3in1 (PT)    Recommendations for Other Services       Precautions / Restrictions Precautions Precautions: Fall Precaution Comments: Rt foot cellulitis with edema and blisters Restrictions Weight Bearing Restrictions: No    Mobility  Bed Mobility     Transfers Overall transfer level: Needs assistance Equipment used: Rolling walker (2 wheeled);None Transfers: Sit to/from Stand Sit to Stand: Min assist Stand pivot transfers: Min assist       General transfer comment: assist to rise from chair with cues for hand placement  Ambulation/Gait Ambulation/Gait assistance: Min guard Gait Distance (Feet): 125 Feet Assistive device: Rolling walker (2 wheeled) Gait Pattern/deviations: Step-to pattern;Trunk flexed;Decreased stance time - right   Gait velocity interpretation: 1.31 - 2.62 ft/sec, indicative of limited community ambulator General Gait Details: cues for sequence and to slide not pick up RW   Stairs             Wheelchair Mobility    Modified Rankin (Stroke Patients Only)       Balance Overall balance assessment: Needs assistance Sitting-balance support: No upper extremity supported;Feet supported Sitting balance-Leahy Scale: Good Sitting balance -  Comments: good at toilet   Standing balance support: Bilateral upper extremity supported;During functional activity Standing balance-Leahy Scale: Poor Standing balance comment: reliant on RW for standing and gait                            Cognition Arousal/Alertness: Awake/alert Behavior During Therapy: WFL for tasks assessed/performed Overall Cognitive Status: Within Functional Limits for tasks assessed                                        Exercises General Exercises - Lower Extremity Long Arc Quad: AROM;Both;10 reps;Seated Hip Flexion/Marching: AROM;Both;Standing;20 reps    General Comments General comments (skin integrity, edema, etc.): HR 89-108 during session      Pertinent Vitals/Pain Pain Assessment: 0-10 Pain Score: 5  Pain Location: Rt foot Pain Descriptors / Indicators: Grimacing;Guarding Pain Intervention(s): Limited activity within patient's tolerance;Monitored during session;Repositioned    Home Living Family/patient expects to be discharged to:: Private residence Living Arrangements: Spouse/significant other Available Help at Discharge: Family Type of Home: House Home Access: Stairs to enter Entrance Stairs-Rails: None Home Layout: One level Home Equipment: Grab bars - tub/shower;Shower seat;Walker - 2 wheels      Prior Function Level of Independence: Independent with assistive device(s)      Comments: Had been using RW secondary to pain; drives; I with ADLs/IADLs   PT Goals (current goals can now be found in the care plan section) Acute Rehab PT Goals Patient Stated Goal: to decrease pain Progress towards PT goals: Progressing toward goals  Frequency    Min 3X/week      PT Plan Current plan remains appropriate    Co-evaluation              AM-PAC PT "6 Clicks" Mobility   Outcome Measure  Help needed turning from your back to your side while in a flat bed without using bedrails?: A Little Help needed  moving from lying on your back to sitting on the side of a flat bed without using bedrails?: A Little Help needed moving to and from a bed to a chair (including a wheelchair)?: A Little Help needed standing up from a chair using your arms (e.g., wheelchair or bedside chair)?: A Little Help needed to walk in hospital room?: A Little Help needed climbing 3-5 steps with a railing? : A Lot 6 Click Score: 17    End of Session Equipment Utilized During Treatment: Gait belt Activity Tolerance: Patient tolerated treatment well Patient left: in chair;with call bell/phone within reach Nurse Communication: Mobility status PT Visit Diagnosis: Unsteadiness on feet (R26.81);Difficulty in walking, not elsewhere classified (R26.2);Pain     Time: [id number] PT Time Calculation (min) (ACUTE ONLY): 28 min  Charges:  $Gait Training: 8-22 mins $Therapeutic Exercise: 8-22 mins                     Louie Casa, SPT Acute Rehab: [phone]     Domingo Dimes 10/11/2020, 9:51 AM

## 2020-10-12 DIAGNOSIS — L03115 Cellulitis of right lower limb: Secondary | ICD-10-CM

## 2020-10-12 LAB — BASIC METABOLIC PANEL
Anion gap: 7 (ref 5–15)
BUN: 20 mg/dL (ref 8–23)
CO2: 30 mmol/L (ref 22–32)
Calcium: 9.1 mg/dL (ref 8.9–10.3)
Chloride: 100 mmol/L (ref 98–111)
Creatinine, Ser: 0.66 mg/dL (ref 0.44–1.00)
GFR, Estimated: >60 mL/min (ref >60)
Glucose, Bld: 177 mg/dL — ABNORMAL HIGH (ref 70–99)
Potassium: 3.8 mmol/L (ref 3.5–5.1)
Sodium: 137 mmol/L (ref 135–145)

## 2020-10-12 LAB — CBC
HCT: 31.4 % — ABNORMAL LOW (ref 36.0–46.0)
Hemoglobin: 10.7 g/dL — ABNORMAL LOW (ref 12.0–15.0)
MCH: 30.7 pg (ref 26.0–34.0)
MCHC: 34.1 g/dL (ref 30.0–36.0)
MCV: 90 fL (ref 80.0–100.0)
Platelets: 179 10*3/uL (ref 150–400)
RBC: 3.49 MIL/uL — ABNORMAL LOW (ref 3.87–5.11)
RDW: 13.9 % (ref 11.5–15.5)
WBC: 8.5 10*3/uL (ref 4.0–10.5)
nRBC: 0 % (ref 0.0–0.2)

## 2020-10-12 LAB — MAGNESIUM: Magnesium: 1.8 mg/dL (ref 1.7–2.4)

## 2020-10-12 LAB — PHOSPHORUS: Phosphorus: 3.1 mg/dL (ref 2.5–4.6)

## 2020-10-12 MED ORDER — SODIUM CHLORIDE 0.9 % IV SOLN
2.0000 g | Freq: Two times a day (BID) | INTRAVENOUS | Status: AC
  Administered 2020-10-12 – 2020-10-18 (×11): 2 g via INTRAVENOUS
  Filled 2020-10-12 (×15): qty 2

## 2020-10-12 MED ORDER — VANCOMYCIN HCL IN DEXTROSE 1-5 GM/200ML-% IV SOLN: 1000.0000 mg | Freq: Once | INTRAVENOUS | Status: DC

## 2020-10-12 MED ORDER — VANCOMYCIN HCL 1250 MG/250ML IV SOLN
1250.0000 mg | INTRAVENOUS | Status: AC
  Administered 2020-10-12 – 2020-10-18 (×7): 1250 mg via INTRAVENOUS
  Filled 2020-10-12 (×7): qty 250

## 2020-10-12 MED ORDER — SODIUM CHLORIDE 0.9 % IV SOLN: 2.0000 g | Freq: Once | INTRAVENOUS | Status: DC

## 2020-10-12 NOTE — Care Management Important Message (Signed)
Important Message  Patient Details  Name: [name] Price MRN: [id number] Date of Birth: 1940-07-08   Medicare Important Message Given:  Yes     [name] 10/12/2020, 3:02 PM

## 2020-10-12 NOTE — Progress Notes (Signed)
Pharmacy Antibiotic Note  [name] Price is a 80 y.o. female admitted on 10/08/2020 with cellulitis.  Pharmacy has been consulted for Vanco, Cefepime dosing.  CC/HPI:  pain and swelling in her right foot and lower leg, diarrhea from Keflex  ID: Rt foot swelling, cellulitis XR Soft tissue swelling with no acute osseous abnormality identified - Afebrile, WBC 8.5 down; LA 4.7>1.9, Scr 0.66  Cephalexin started 8/26 prior to arrival Ceftriaxone 8/27 x1 Doxy 8/27>8/30  Ancef 8/30-8/31 Vancomycin 8/27 x1, 8/31>> Cefepime 8/31>>  8/27: BC x 2: NGTD 8/27: Cdiff: negative  Plan: Vancomycin '1250mg'$  IV Q q24h hrs. Goal AUC 400-550. Expected AUC: 512 SCr used: 0.8  Cefepime 2g IV q12    Height: '5\' 3"'$  (160 cm) Weight: 70.5 kg (155 lb 6.8 oz) IBW/kg (Calculated) : 52.4  Temp (24hrs), Avg:98.8 F (37.1 C), Min:98.8 F (37.1 C), Max:98.8 F (37.1 C)  Recent Labs  Lab 10/08/20 1110 10/08/20 1851 10/09/20 0356 10/09/20 1151 10/10/20 0301 10/11/20 0215 10/12/20 0033  WBC 9.9  --  4.3  --  5.5 8.2 8.5  CREATININE 1.48*  --  1.26*  --  0.78 0.60 0.66  LATICACIDVEN 4.7* 3.0*  --  1.9  --   --   --     Estimated Creatinine Clearance: 55.4 mL/min (by C-G formula based on SCr of 0.66 mg/dL).    Allergies  Allergen Reactions   Codeine Nausea And Vomiting   Rosan Calbert S. Alford Highland, PharmD, BCPS Clinical Staff Pharmacist [email or web address]  Wayland Salinas 10/12/2020 10:32 AM

## 2020-10-12 NOTE — Progress Notes (Signed)
Occupational Therapy Treatment Patient Details Name: [name] Price MRN: [id number] DOB: August 12, 1940 Today's Date: 10/12/2020    History of present illness Pt is a 80 y/o female admitted 8/26 secondary to R foot cellulitis and early sepsis. PMH includes breast cancer and bilateral THA.   OT comments  Patient met lying supine in bed in agreement wit OT treatment session.0/10 pain at rest and with activity. Patient demonstrates functional mobility with Min guard and 3/3 grooming tasks standing at sink level with supervision A. Able to maintain static standing balance with and without UE support on RW/sink surface. Toilet transfer to standard commode in bathroom with Min guard for controlled descent and heavy Min A for sit to stand with use of grab bar. Min guard for hygiene/clothing management. Patient would benefit from [facility] over standard height toilet in bathroom. OT will continue to follow acutely.    Follow Up Recommendations  Home health OT;Supervision/Assistance - 24 hour    Equipment Recommendations  3 in 1 bedside commode    Recommendations for Other Services      Precautions / Restrictions Precautions Precautions: Fall Precaution Comments: Rt foot cellulitis with edema and blisters Restrictions Weight Bearing Restrictions: No       Mobility Bed Mobility Overal bed mobility: Needs Assistance Bed Mobility: Supine to Sit     Supine to sit: Supervision;HOB elevated     General bed mobility comments: Able to advance BLE toward EOB and elevate trunk with min use of bed rail.    Transfers Overall transfer level: Needs assistance Equipment used: Rolling walker (2 wheeled);None Transfers: Sit to/from [employer] to Stand: Min assist;Min guard Stand pivot transfers: Min assist;Min guard       General transfer comment: Min guard to Min A for sit to stand and stand-pivot transfers.    Balance Overall balance assessment: Needs  assistance Sitting-balance support: No upper extremity supported;Feet supported Sitting balance-Leahy Scale: Good     Standing balance support: Bilateral upper extremity supported;During functional activity Standing balance-Leahy Scale: Fair Standing balance comment: Able to maintain static standing balance without UE support during grooming tasks standing at sink level.                           ADL either performed or assessed with clinical judgement   ADL Overall ADL's : Needs assistance/impaired     Grooming: Supervision/safety;Standing Grooming Details (indicate cue type and reason): Supervision A standing at sink level with use of RW.             Lower Body Dressing: Moderate assistance;Sit to/from stand Lower Body Dressing Details (indicate cue type and reason): Mod A to don footwear seated EOB. Toilet Transfer: Investment banker, operational Details (indicate cue type and reason): To standard commode in bathroom with cues for hand placement and safety. Toileting- Clothing Manipulation and Hygiene: Minimal assistance Toileting - Clothing Manipulation Details (indicate cue type and reason): Min A for sit <> stand from standard height commode in bathroom with cues for hand placement.             Vision   Vision Assessment?: No apparent visual deficits   Perception     Praxis      Cognition Arousal/Alertness: Awake/alert Behavior During Therapy: WFL for tasks assessed/performed Overall Cognitive Status: Within Functional Limits for tasks assessed  Exercises     Shoulder Instructions       General Comments Continued erythema and edema in RLE.    Pertinent Vitals/ Pain       Pain Assessment: No/denies pain Pain Intervention(s): Monitored during session  Home Living                                          Prior Functioning/Environment               Frequency  Min 2X/week        Progress Toward Goals  OT Goals(current goals can now be found in the care plan section)  Progress towards OT goals: Progressing toward goals  Acute Rehab OT Goals Patient Stated Goal: to decrease pain OT Goal Formulation: With patient Time For Goal Achievement: 10/25/20 Potential to Achieve Goals: Good ADL Goals Pt Will Perform Grooming: with modified independence;standing Pt Will Perform Upper Body Dressing: Independently Pt Will Perform Lower Body Dressing: with modified independence;sit to/from stand Pt Will Transfer to Toilet: with modified independence;ambulating Pt Will Perform Toileting - Clothing Manipulation and hygiene: with modified independence;sit to/from stand Pt Will Perform Tub/Shower Transfer: Tub transfer;Shower transfer;tub bench;rolling walker  Plan Discharge plan remains appropriate;Frequency remains appropriate    Co-evaluation                 AM-PAC OT "6 Clicks" Daily Activity     Outcome Measure   Help from another person eating meals?: None Help from another person taking care of personal grooming?: A Little Help from another person toileting, which includes using toliet, bedpan, or urinal?: A Little Help from another person bathing (including washing, rinsing, drying)?: A Little Help from another person to put on and taking off regular upper body clothing?: None Help from another person to put on and taking off regular lower body clothing?: A Little 6 Click Score: 20    End of Session Equipment Utilized During Treatment: Gait belt;Rolling walker  OT Visit Diagnosis: Unsteadiness on feet (R26.81);Muscle weakness (generalized) (M62.81);Pain Pain - Right/Left: Right Pain - part of body: Leg;Ankle and joints of foot   Activity Tolerance Patient tolerated treatment well   Patient Left in chair;with call bell/phone within reach;with nursing/sitter in room   Nurse Communication Mobility status         Time: 5397-6734 OT Time Calculation (min): 27 min  Charges: OT General Charges $OT Visit: 1 Visit OT Treatments $Self Care/Home Management : 23-37 mins  Parlee Amescua H. OTR/L Supplemental OT, Department of rehab services [phone]   Stephine Langbehn R H. 10/12/2020, 8:57 AM

## 2020-10-12 NOTE — Progress Notes (Signed)
PROGRESS NOTE  [name] Price [id number] DOB: [birth year] DOA: 10/08/2020 PCP: Tonia Ghent, MD   LOS: 4 days    Brief narrative: [name] Price is a 80 y.o. female with past medical history of anxiety, depression presented to the hospital with right foot rash and blister with new onset diarrhea.  Patient recently had some itching around the right foot and had been prescribed for triamcinolone for 2 weeks but continued to have rash and itchiness which progressed.  He went to see her dermatologist who prescribed topical dexamethasone for a week but started having blisters and painful rash without any fever.  She then came into the ED and DVT study was negative and was given Keflex but started having multiple episodes of watery diarrhea and increasing blister and redness.  Patient then presented back to the ED where she was noted to be mildly tachycardic with a temperature of around 99 F.  Lactate was elevated at 4.3 and creatinine elevated at 1.4 compared to 0.8.  CT scan of the foot did not show any evidence of foreign body but diffuse soft tissue swelling.  Patient was transferred to hospital for further evaluation and treatment.  Assessment/Plan:  Right foot cellulitis with hemorrhagic blister Patient was initially placed on doxycycline.  She was found to have worsening erythema and tenderness along with swelling.  Was changed over to cefazolin.  Was seen by orthopedics and underwent drainage of the large blister. Patient with worsening erythema this morning.  There was some concern for exposure to poison ivy so patient was placed on steroids.  In view of worsening erythema we will discontinue the steroids.  We will broaden antibiotic spectrum to vancomycin and cefepime.  CT scan of the lower extremity showed only soft tissue swelling without any abscess. Keep right leg elevated as much as possible.  Follow-up on culture data.  WBC is noted to be normal.    Early sepsis likely  secondary to foot cellulitis. Evidenced by tachycardia, and elevated lactate level, and early end organ damage of a new AKI.  Sepsis physiology appears to have improved.  Continue to monitor.  Normocytic anemia Drop in hemoglobin is dilutional.  No evidence of overt bleeding.  Hypokalemia/hypomagnesemia/hypophosphatemia Improved with replacement.    Acute kidney injury Likely secondary to prerenal azotemia from severe diarrhea.  Function is back to baseline.  Monitor urine output.     Diarrhea  C. difficile was negative.  On loperamide, continue probiotics while on antibiotic.  Weakness/debility.  Physical therapy recommended home PT on discharge.  DVT prophylaxis: heparin injection 5,000 Units Start: 10/08/20 2200  Code Status: Full code  Family Communication:  None today.  Spoke with the patient's  spouse on the phone on 10/10/2020.  Status is: Inpatient  Remains inpatient appropriate because:IV treatments appropriate due to intensity of illness or inability to take PO, and Inpatient level of care appropriate due to severity of illness  Dispo: The patient is from: Home              Anticipated d/c is to: Home health as per PT recommendation.              Patient currently is not medically stable to d/c.   Difficult to place patient No  Consultants: orthopedics  Procedures: Incision and drainage of the blister on 10/09/2020  Anti-infectives:   Anti-infectives (From admission, onward)    Start     Dose/Rate Route Frequency Ordered Stop   10/12/20 1130  ceFEPIme (MAXIPIME)  2 g in sodium chloride 0.9 % 100 mL IVPB        2 g 200 mL/hr over 30 Minutes Intravenous Every 12 hours 10/12/20 1023     10/12/20 1130  vancomycin (VANCOREADY) IVPB 1250 mg/250 mL        1,250 mg 166.7 mL/hr over 90 Minutes Intravenous Every 24 hours 10/12/20 1030     10/12/20 1045  vancomycin (VANCOCIN) IVPB 1000 mg/200 mL premix  Status:  Discontinued        1,000 mg 200 mL/hr over 60 Minutes  Intravenous  Once 10/12/20 0955 10/12/20 1015   10/12/20 1045  ceFEPIme (MAXIPIME) 2 g in sodium chloride 0.9 % 100 mL IVPB  Status:  Discontinued        2 g 200 mL/hr over 30 Minutes Intravenous  Once 10/12/20 0955 10/12/20 1015   10/11/20 1400  ceFAZolin (ANCEF) IVPB 1 g/50 mL premix  Status:  Discontinued        1 g 100 mL/hr over 30 Minutes Intravenous Every 8 hours 10/11/20 0902 10/12/20 0954   10/08/20 2200  doxycycline (VIBRA-TABS) tablet 100 mg  Status:  Discontinued        100 mg Oral Every 12 hours 10/08/20 1704 10/11/20 0902   10/08/20 1408  vancomycin variable dose per unstable renal function (pharmacist dosing)  Status:  Discontinued         Does not apply See admin instructions 10/08/20 1408 10/08/20 1703   10/08/20 1400  vancomycin (VANCOREADY) IVPB 1500 mg/300 mL        1,500 mg 150 mL/hr over 120 Minutes Intravenous  Once 10/08/20 1358 10/08/20 1826   10/08/20 1400  cefTRIAXone (ROCEPHIN) 2 g in sodium chloride 0.9 % 100 mL IVPB        2 g 200 mL/hr over 30 Minutes Intravenous  Once 10/08/20 1358 10/08/20 1514       Subjective: Patient mentions that her right leg appears to be more red today.  Dressing was removed and wound was examined.  Denies any chest pain shortness of breath.  Objective: Vitals:   10/11/20 1428 10/11/20 2137  BP: 117/85 (!) 148/81  Pulse: 94 (!) 105  Resp: 16 20  Temp:  98.8 F (37.1 C)  SpO2: 94% 97%    Intake/Output Summary (Last 24 hours) at 10/12/2020 1107 Last data filed at 10/12/2020 0720 Gross per 24 hour  Intake 390.95 ml  Output --  Net 390.95 ml    Filed Weights   10/08/20 2043  Weight: 70.5 kg   Body mass index is 27.53 kg/m.   Physical Exam:  General appearance: Awake alert.  In no distress Resp: Clear to auscultation bilaterally.  Normal effort Cardio: S1-S2 is normal regular.  No S3-S4.  No rubs murmurs or bruit GI: Abdomen is soft.  Nontender nondistended.  Bowel sounds are present normal.  No masses  organomegaly Extremities: Blister is not noted anymore.  However erythema seems to be extending beyond the margins that were drawn at the time of admission.  Extending up to the lower leg as well as medially.  Warm to touch.  Mildly tender to palpate.  No area of fluctuation identified. Neurologic: Alert and oriented x3.  No focal neurological deficits.    On presentation:    Data Review: I have personally reviewed the following laboratory data and studies,  CBC: Recent Labs  Lab 10/07/20 0834 10/08/20 1110 10/09/20 0356 10/10/20 0301 10/11/20 0215 10/12/20 0033  WBC 16.9* 9.9 4.3 5.5 8.2  8.5  NEUTROABS 14.9* 8.4*  --   --   --   --   HGB 14.2 13.6 12.6 11.3* 11.0* 10.7*  HCT 42.7 40.3 37.3 33.9* 31.6* 31.4*  MCV 95.5 92.6 91.4 90.9 89.8 90.0  PLT 172 166 145* 146* 146* 0000000    Basic Metabolic Panel: Recent Labs  Lab 10/08/20 1110 10/09/20 0356 10/10/20 0301 10/11/20 0215 10/12/20 0033  NA 136 132* 131* 134* 137  K 3.6 3.2* 3.6 3.4* 3.8  CL 103 104 102 102 100  CO2 20* 19* '22 26 30  '$ GLUCOSE 113* 89 132* 149* 177*  BUN 31* 37* 35* 24* 20  CREATININE 1.48* 1.26* 0.78 0.60 0.66  CALCIUM 8.7* 8.4* 9.6 9.4 9.1  MG  --   --  1.6* 1.7 1.8  PHOS  --   --  1.5* 1.5* 3.1     CBG: No results for input(s): GLUCAP in the last 168 hours. Recent Results (from the past 240 hour(s))  Resp Panel by RT-PCR (Flu A&B, Covid) Nasopharyngeal Swab     Status: None   Collection Time: 10/08/20  1:57 PM   Specimen: Nasopharyngeal Swab; Nasopharyngeal(NP) swabs in vial transport medium  Result Value Ref Range Status   SARS Coronavirus 2 by RT PCR NEGATIVE NEGATIVE Final    Comment: (NOTE) SARS-CoV-2 target nucleic acids are NOT DETECTED.  The SARS-CoV-2 RNA is generally detectable in upper respiratory specimens during the acute phase of infection. The lowest concentration of SARS-CoV-2 viral copies this assay can detect is 138 copies/mL. A negative result does not preclude  SARS-Cov-2 infection and should not be used as the sole basis for treatment or other patient management decisions. A negative result may occur with  improper specimen collection/handling, submission of specimen other than nasopharyngeal swab, presence of viral mutation(s) within the areas targeted by this assay, and inadequate number of viral copies(<138 copies/mL). A negative result must be combined with clinical observations, patient history, and epidemiological information. The expected result is Negative.  Fact Sheet for Patients:  EntrepreneurPulse.com.au  Fact Sheet for Healthcare Providers:  IncredibleEmployment.be  This test is no t yet approved or cleared by the [place] FDA and  has been authorized for detection and/or diagnosis of SARS-CoV-2 by FDA under an Emergency Use Authorization (EUA). This EUA will remain  in effect (meaning this test can be used) for the duration of the COVID-19 declaration under Section 564(b)(1) of the Act, 21 U.S.C.section 360bbb-3(b)(1), unless the authorization is terminated  or revoked sooner.       Influenza A by PCR NEGATIVE NEGATIVE Final   Influenza B by PCR NEGATIVE NEGATIVE Final    Comment: (NOTE) The Xpert Xpress SARS-CoV-2/FLU/RSV plus assay is intended as an aid in the diagnosis of influenza from Nasopharyngeal swab specimens and should not be used as a sole basis for treatment. Nasal washings and aspirates are unacceptable for Xpert Xpress SARS-CoV-2/FLU/RSV testing.  Fact Sheet for Patients: EntrepreneurPulse.com.au  Fact Sheet for Healthcare Providers: IncredibleEmployment.be  This test is not yet approved or cleared by the [place] FDA and has been authorized for detection and/or diagnosis of SARS-CoV-2 by FDA under an Emergency Use Authorization (EUA). This EUA will remain in effect (meaning this test can be used) for the duration of  the COVID-19 declaration under Section 564(b)(1) of the Act, 21 U.S.C. section 360bbb-3(b)(1), unless the authorization is terminated or revoked.  Performed at [facility], [place] [address], [place], [place] [address]   Blood Culture (routine  x 2)     Status: None (Preliminary result)   Collection Time: 10/08/20  2:05 PM   Specimen: BLOOD  Result Value Ref Range Status   Specimen Description BLOOD LEFT ANTECUBITAL  Final   Special Requests   Final    BOTTLES DRAWN AEROBIC AND ANAEROBIC Blood Culture adequate volume   Culture   Final    NO GROWTH 4 DAYS Performed at [facility], [address], [place], [place] [address]    Report Status PENDING  Incomplete  Blood Culture (routine x 2)     Status: None (Preliminary result)   Collection Time: 10/08/20  2:14 PM   Specimen: BLOOD  Result Value Ref Range Status   Specimen Description BLOOD SITE NOT SPECIFIED  Final   Special Requests   Final    BOTTLES DRAWN AEROBIC AND ANAEROBIC Blood Culture results may not be optimal due to an inadequate volume of blood received in culture bottles   Culture   Final    NO GROWTH 4 DAYS Performed at [facility], [place] [address], [place], [place] [address]    Report Status PENDING  Incomplete  C Difficile Quick Screen w PCR reflex     Status: None   Collection Time: 10/08/20  4:24 PM   Specimen: STOOL  Result Value Ref Range Status   C Diff antigen NEGATIVE NEGATIVE Final   C Diff toxin NEGATIVE NEGATIVE Final   C Diff interpretation No C. difficile detected.  Final    Comment: Performed at [facility], [place] [address], [place],  [address]      Studies: No results found.    Bonnielee Haff, MD  [facility] 10/12/2020  If 7PM-7AM, please contact night-coverage

## 2020-10-12 NOTE — Plan of Care (Signed)

## 2020-10-13 LAB — CULTURE, BLOOD (ROUTINE X 2)
Culture: NO GROWTH
Culture: NO GROWTH
Special Requests: ADEQUATE

## 2020-10-13 LAB — RETICULOCYTES
Immature Retic Fract: 24.3 % — ABNORMAL HIGH (ref 2.3–15.9)
RBC.: 3.42 MIL/uL — ABNORMAL LOW (ref 3.87–5.11)
Retic Count, Absolute: 41.4 10*3/uL (ref 19.0–186.0)
Retic Ct Pct: 1.2 % (ref 0.4–3.1)

## 2020-10-13 LAB — VITAMIN B12: Vitamin B-12: 2515 pg/mL — ABNORMAL HIGH (ref 180–914)

## 2020-10-13 LAB — IRON AND TIBC
Iron: 54 ug/dL (ref 28–170)
Saturation Ratios: 28 % (ref 10.4–31.8)
TIBC: 190 ug/dL — ABNORMAL LOW (ref 250–450)
UIBC: 136 ug/dL

## 2020-10-13 LAB — BASIC METABOLIC PANEL
Anion gap: 8 (ref 5–15)
BUN: 20 mg/dL (ref 8–23)
CO2: 30 mmol/L (ref 22–32)
Calcium: 8.7 mg/dL — ABNORMAL LOW (ref 8.9–10.3)
Chloride: 98 mmol/L (ref 98–111)
Creatinine, Ser: 0.65 mg/dL (ref 0.44–1.00)
GFR, Estimated: >60 mL/min (ref >60)
Glucose, Bld: 149 mg/dL — ABNORMAL HIGH (ref 70–99)
Potassium: 3.6 mmol/L (ref 3.5–5.1)
Sodium: 136 mmol/L (ref 135–145)

## 2020-10-13 LAB — FOLATE: Folate: 13.4 ng/mL (ref >5.9)

## 2020-10-13 LAB — FERRITIN: Ferritin: 421 ng/mL — ABNORMAL HIGH (ref 11–307)

## 2020-10-13 MED ORDER — FUROSEMIDE 10 MG/ML IJ SOLN
40.0000 mg | Freq: Once | INTRAMUSCULAR | Status: AC
  Administered 2020-10-13: 40 mg via INTRAVENOUS
  Filled 2020-10-13: qty 4

## 2020-10-13 MED ORDER — POTASSIUM CHLORIDE CRYS ER 20 MEQ PO TBCR
40.0000 meq | EXTENDED_RELEASE_TABLET | Freq: Once | ORAL | Status: AC
  Administered 2020-10-13: 40 meq via ORAL
  Filled 2020-10-13: qty 2

## 2020-10-13 NOTE — Progress Notes (Signed)
Rt foot drsg was saturated was last changed 8/31 2130 and changed again weeping clear serosanguinous fluids. Will continue to monitor. Arthor Captain LPN

## 2020-10-13 NOTE — Progress Notes (Signed)
Physical Therapy Treatment Patient Details Name: [name] Price MRN: [id number] DOB: [birth year] Today's Date: 10/13/2020    History of Present Illness Pt is a 80 y/o female admitted 8/26 secondary to R foot cellulitis and early sepsis. PMH includes breast cancer and bilateral THA.    PT Comments    Pt tolerated treatment well with VSS. Pt demonstrated increased ambulation distance and great recall of cues compared to previous sessions. Progressed to stair training with verbal cueing provided. Continue to recommend HHPT with supervision/assistance for mobility and transfers, as pt is showing improvements in mobility, but requires supervision for safety.  HR during activity - 101-118   Follow Up Recommendations  Home health PT;Supervision/Assistance - 24 hour     Equipment Recommendations  3in1 (PT)    Recommendations for Other Services       Precautions / Restrictions Precautions Precautions: Fall Precaution Comments: Rt foot cellulitis with edema and blisters Restrictions Weight Bearing Restrictions: No    Mobility  Bed Mobility Overal bed mobility: Needs Assistance Bed Mobility: Supine to Sit     Supine to sit: Supervision;HOB elevated     General bed mobility comments: Use of bedrail to pull to sitting position. Able to navigate LEs off bed.    Transfers Overall transfer level: Needs assistance Equipment used: Rolling walker (2 wheeled) Transfers: Sit to/from Stand Sit to Stand: Min guard Stand pivot transfers: Min guard       General transfer comment: Sit to stand x2: 1st trial from bed, 2nd from toilet. Pt demonstrated good recall of hand placement from previous session from transfers. Performed with increased time and min guard for safety.  Ambulation/Gait   Gait Distance (Feet): 300 Feet Assistive device: Rolling walker (2 wheeled) Gait Pattern/deviations: Step-through pattern Gait velocity: Decreased Gait velocity interpretation: 1.31 - 2.62  ft/sec, indicative of limited community ambulator General Gait Details: Demonstrated good recall of use of RW and upright posture. Verbal cues provided to keep RW close.   Stairs Stairs: Yes Stairs assistance: Min assist Stair Management: Step to pattern;No rails Number of Stairs: 1 General stair comments: Educated on use of stairs with verbal cues provided for LEs. Pt held on to SPT's arms during stair navigation with min A. Pt had greater difficulty descending stairs.   Wheelchair Mobility    Modified Rankin (Stroke Patients Only)       Balance Overall balance assessment: Needs assistance Sitting-balance support: No upper extremity supported;Feet supported Sitting balance-Leahy Scale: Good Sitting balance - Comments: Able to sit EOB without support for balance and good at toilet   Standing balance support: Bilateral upper extremity supported;During functional activity Standing balance-Leahy Scale: Fair Standing balance comment: Able to maintain static standing balance at sink to comb hair and perform lip care. Relies on RW with BUE support during ambulation                            Cognition Arousal/Alertness: Awake/alert Behavior During Therapy: WFL for tasks assessed/performed Overall Cognitive Status: Within Functional Limits for tasks assessed                                        Exercises General Exercises - Lower Extremity Hip Flexion/Marching: AROM;Both;10 reps;Standing Mini-Sqauts: Both;Strengthening;5 reps;Standing    General Comments General comments (skin integrity, edema, etc.): VSS on RA. HR 101-118 during activity  Pertinent Vitals/Pain Pain Score: 1  Pain Location: R foot Pain Descriptors / Indicators: Discomfort Pain Intervention(s): Monitored during session;Repositioned    Home Living                      Prior Function            PT Goals (current goals can now be found in the care plan section)  Progress towards PT goals: Progressing toward goals    Frequency    Min 3X/week      PT Plan Current plan remains appropriate    Co-evaluation              AM-PAC PT "6 Clicks" Mobility   Outcome Measure  Help needed turning from your back to your side while in a flat bed without using bedrails?: A Little Help needed moving from lying on your back to sitting on the side of a flat bed without using bedrails?: A Little Help needed moving to and from a bed to a chair (including a wheelchair)?: A Little Help needed standing up from a chair using your arms (e.g., wheelchair or bedside chair)?: A Little Help needed to walk in hospital room?: A Little Help needed climbing 3-5 steps with a railing? : A Little 6 Click Score: 18    End of Session Equipment Utilized During Treatment: Gait belt Activity Tolerance: Patient tolerated treatment well Patient left: in chair;with call bell/phone within reach Nurse Communication: Mobility status PT Visit Diagnosis: Unsteadiness on feet (R26.81);Difficulty in walking, not elsewhere classified (R26.2);Pain Pain - Right/Left: Right Pain - part of body: Ankle and joints of foot     Time: 0920-0952 PT Time Calculation (min) (ACUTE ONLY): 32 min  Charges:  $Gait Training: 8-22 mins $Therapeutic Activity: 8-22 mins                     Louie Casa, SPT Acute Rehab: [phone]     Domingo Dimes 10/13/2020, 11:46 AM

## 2020-10-13 NOTE — Progress Notes (Signed)
PROGRESS NOTE  [name] Price [id number] DOB: 11-25-1940 DOA: 10/08/2020 PCP: Tonia Ghent, MD   LOS: 5 days    Brief narrative: [name] Price is a 80 y.o. female with past medical history of anxiety, depression presented to the hospital with right foot rash and blister with new onset diarrhea.  Patient recently had some itching around the right foot and had been prescribed for triamcinolone for 2 weeks but continued to have rash and itchiness which progressed.  He went to see her dermatologist who prescribed topical dexamethasone for a week but started having blisters and painful rash without any fever.  She then came into the ED and DVT study was negative and was given Keflex but started having multiple episodes of watery diarrhea and increasing blister and redness.  Patient then presented back to the ED where she was noted to be mildly tachycardic with a temperature of around 99 F.  Lactate was elevated at 4.3 and creatinine elevated at 1.4 compared to 0.8.  CT scan of the foot did not show any evidence of foreign body but diffuse soft tissue swelling.  Patient was transferred to hospital for further evaluation and treatment.  Assessment/Plan:  Right foot cellulitis with hemorrhagic blister Patient was initially placed on doxycycline.  She was found to have worsening erythema and tenderness along with swelling.  Was changed over to cefazolin.  Was seen by orthopedics and underwent drainage of the large blister. Patient noted to have worsening erythema yesterday morning.  Antibiotic spectrum was broadened and she was placed on vancomycin and cefepime. Due to concern for exposure to poison ivy she was also placed on steroids but due to worsening erythema steroids were discontinued. CT scan of the lower extremity showed only soft tissue swelling without any abscess. Keep right leg elevated.  Wound was not examined today as dressing was recently changed.  Discussed with nursing staff.   If they change dressing again later today they will call me to take a look at the leg.  Patient mentions that she is feeling slightly better.  Early sepsis likely secondary to foot cellulitis. Evidenced by tachycardia, and elevated lactate level, and early end organ damage of a new AKI.  Sepsis physiology appears to have improved.  Continue to monitor.  Cultures without any growth  Normocytic anemia Drop in hemoglobin is dilutional.  No evidence of overt bleeding.  Anemia panel shows ferritin of 421, TIBC 198, iron 54, folate 13.4, vitamin B12 2515.  Hypokalemia/hypomagnesemia/hypophosphatemia Improved with replacement.  Give additional dose of potassium today.  Acute kidney injury Likely secondary to prerenal azotemia from severe diarrhea.  Renal function is back to baseline.   Diarrhea  C. difficile was negative.  On loperamide, continue probiotics while on antibiotic.  Appears to have resolved.  Weakness/debility.  Physical therapy recommended home PT on discharge.  DVT prophylaxis: heparin injection 5,000 Units Start: 10/08/20 2200  Code Status: Full code  Family Communication:  No family at bedside.  Will speak with husband later today.  Status is: Inpatient  Remains inpatient appropriate because:IV treatments appropriate due to intensity of illness or inability to take PO, and Inpatient level of care appropriate due to severity of illness  Dispo: The patient is from: Home              Anticipated d/c is to: Home health as per PT recommendation.              Patient currently is not medically stable to d/c.  Difficult to place patient No  Consultants: orthopedics  Procedures: Incision and drainage of the blister on 10/09/2020  Anti-infectives:   Anti-infectives (From admission, onward)    Start     Dose/Rate Route Frequency Ordered Stop   10/12/20 1130  ceFEPIme (MAXIPIME) 2 g in sodium chloride 0.9 % 100 mL IVPB        2 g 200 mL/hr over 30 Minutes Intravenous  Every 12 hours 10/12/20 1023     10/12/20 1130  vancomycin (VANCOREADY) IVPB 1250 mg/250 mL        1,250 mg 166.7 mL/hr over 90 Minutes Intravenous Every 24 hours 10/12/20 1030     10/12/20 1045  vancomycin (VANCOCIN) IVPB 1000 mg/200 mL premix  Status:  Discontinued        1,000 mg 200 mL/hr over 60 Minutes Intravenous  Once 10/12/20 0955 10/12/20 1015   10/12/20 1045  ceFEPIme (MAXIPIME) 2 g in sodium chloride 0.9 % 100 mL IVPB  Status:  Discontinued        2 g 200 mL/hr over 30 Minutes Intravenous  Once 10/12/20 0955 10/12/20 1015   10/11/20 1400  ceFAZolin (ANCEF) IVPB 1 g/50 mL premix  Status:  Discontinued        1 g 100 mL/hr over 30 Minutes Intravenous Every 8 hours 10/11/20 0902 10/12/20 0954   10/08/20 2200  doxycycline (VIBRA-TABS) tablet 100 mg  Status:  Discontinued        100 mg Oral Every 12 hours 10/08/20 1704 10/11/20 0902   10/08/20 1408  vancomycin variable dose per unstable renal function (pharmacist dosing)  Status:  Discontinued         Does not apply See admin instructions 10/08/20 1408 10/08/20 1703   10/08/20 1400  vancomycin (VANCOREADY) IVPB 1500 mg/300 mL        1,500 mg 150 mL/hr over 120 Minutes Intravenous  Once 10/08/20 1358 10/08/20 1826   10/08/20 1400  cefTRIAXone (ROCEPHIN) 2 g in sodium chloride 0.9 % 100 mL IVPB        2 g 200 mL/hr over 30 Minutes Intravenous  Once 10/08/20 1358 10/08/20 1514       Subjective: Patient mentions that her right leg feels slightly better today compared to yesterday.  However had a lot of bruising so the dressing had to be changed a little while ago.  Denies any significant pain.  Has been able to bear weight on the right leg.  Denies any other symptoms such as shortness of breath, chest pain, nausea or vomiting.    Objective: Vitals:   10/12/20 1942 10/13/20 0330  BP: (!) 154/85 137/85  Pulse: 96 79  Resp: 18 18  Temp: 98.1 F (36.7 C) 98 F (36.7 C)  SpO2: 94% 93%    Intake/Output Summary (Last 24 hours)  at 10/13/2020 1029 Last data filed at 10/12/2020 1300 Gross per 24 hour  Intake 240 ml  Output --  Net 240 ml    Filed Weights   10/08/20 2043  Weight: 70.5 kg   Body mass index is 27.53 kg/m.   Physical Exam:  General appearance: Awake alert.  In no distress Resp: Clear to auscultation bilaterally.  Normal effort Cardio: S1-S2 is normal regular.  No S3-S4.  No rubs murmurs or bruit GI: Abdomen is soft.  Nontender nondistended.  Bowel sounds are present normal.  No masses organomegaly Extremities: Dressing not removed today.  Nurse will call me if they change her dressing later today.   Neurologic: Alert  and oriented x3.  No focal neurological deficits.      On presentation:    Data Review: I have personally reviewed the following laboratory data and studies,  CBC: Recent Labs  Lab 10/07/20 0834 10/08/20 1110 10/09/20 0356 10/10/20 0301 10/11/20 0215 10/12/20 0033  WBC 16.9* 9.9 4.3 5.5 8.2 8.5  NEUTROABS 14.9* 8.4*  --   --   --   --   HGB 14.2 13.6 12.6 11.3* 11.0* 10.7*  HCT 42.7 40.3 37.3 33.9* 31.6* 31.4*  MCV 95.5 92.6 91.4 90.9 89.8 90.0  PLT 172 166 145* 146* 146* 0000000    Basic Metabolic Panel: Recent Labs  Lab 10/09/20 0356 10/10/20 0301 10/11/20 0215 10/12/20 0033 10/13/20 0024  NA 132* 131* 134* 137 136  K 3.2* 3.6 3.4* 3.8 3.6  CL 104 102 102 100 98  CO2 19* '22 26 30 30  '$ GLUCOSE 89 132* 149* 177* 149*  BUN 37* 35* 24* 20 20  CREATININE 1.26* 0.78 0.60 0.66 0.65  CALCIUM 8.4* 9.6 9.4 9.1 8.7*  MG  --  1.6* 1.7 1.8  --   PHOS  --  1.5* 1.5* 3.1  --      CBG: No results for input(s): GLUCAP in the last 168 hours. Recent Results (from the past 240 hour(s))  Resp Panel by RT-PCR (Flu A&B, Covid) Nasopharyngeal Swab     Status: None   Collection Time: 10/08/20  1:57 PM   Specimen: Nasopharyngeal Swab; Nasopharyngeal(NP) swabs in vial transport medium  Result Value Ref Range Status   SARS Coronavirus 2 by RT PCR NEGATIVE NEGATIVE Final     Comment: (NOTE) SARS-CoV-2 target nucleic acids are NOT DETECTED.  The SARS-CoV-2 RNA is generally detectable in upper respiratory specimens during the acute phase of infection. The lowest concentration of SARS-CoV-2 viral copies this assay can detect is 138 copies/mL. A negative result does not preclude SARS-Cov-2 infection and should not be used as the sole basis for treatment or other patient management decisions. A negative result may occur with  improper specimen collection/handling, submission of specimen other than nasopharyngeal swab, presence of viral mutation(s) within the areas targeted by this assay, and inadequate number of viral copies(<138 copies/mL). A negative result must be combined with clinical observations, patient history, and epidemiological information. The expected result is Negative.  Fact Sheet for Patients:  EntrepreneurPulse.com.au  Fact Sheet for Healthcare Providers:  IncredibleEmployment.be  This test is no t yet approved or cleared by the [place] FDA and  has been authorized for detection and/or diagnosis of SARS-CoV-2 by FDA under an Emergency Use Authorization (EUA). This EUA will remain  in effect (meaning this test can be used) for the duration of the COVID-19 declaration under Section 564(b)(1) of the Act, 21 U.S.C.section 360bbb-3(b)(1), unless the authorization is terminated  or revoked sooner.       Influenza A by PCR NEGATIVE NEGATIVE Final   Influenza B by PCR NEGATIVE NEGATIVE Final    Comment: (NOTE) The Xpert Xpress SARS-CoV-2/FLU/RSV plus assay is intended as an aid in the diagnosis of influenza from Nasopharyngeal swab specimens and should not be used as a sole basis for treatment. Nasal washings and aspirates are unacceptable for Xpert Xpress SARS-CoV-2/FLU/RSV testing.  Fact Sheet for Patients: EntrepreneurPulse.com.au  Fact Sheet for Healthcare  Providers: IncredibleEmployment.be  This test is not yet approved or cleared by the [place] FDA and has been authorized for detection and/or diagnosis of SARS-CoV-2 by FDA under an Emergency Use Authorization (EUA).  This EUA will remain in effect (meaning this test can be used) for the duration of the COVID-19 declaration under Section 564(b)(1) of the Act, 21 U.S.C. section 360bbb-3(b)(1), unless the authorization is terminated or revoked.  Performed at [facility], [place] [address] [place], [place] [address]   Blood Culture (routine x 2)     Status: None   Collection Time: 10/08/20  2:05 PM   Specimen: BLOOD  Result Value Ref Range Status   Specimen Description BLOOD LEFT ANTECUBITAL  Final   Special Requests   Final    BOTTLES DRAWN AEROBIC AND ANAEROBIC Blood Culture adequate volume   Culture   Final    NO GROWTH 5 DAYS Performed at [facility], [place] [address] [place], [place] [address]    Report Status 10/13/2020 FINAL  Final  Blood Culture (routine x 2)     Status: None   Collection Time: 10/08/20  2:14 PM   Specimen: BLOOD  Result Value Ref Range Status   Specimen Description BLOOD SITE NOT SPECIFIED  Final   Special Requests   Final    BOTTLES DRAWN AEROBIC AND ANAEROBIC Blood Culture results may not be optimal due to an inadequate volume of blood received in culture bottles   Culture   Final    NO GROWTH 5 DAYS Performed at [facility], [place] [address] [place], [place] [address]    Report Status 10/13/2020 FINAL  Final  C Difficile Quick Screen w PCR reflex     Status: None   Collection Time: 10/08/20  4:24 PM   Specimen: STOOL  Result Value Ref Range Status   C Diff antigen NEGATIVE NEGATIVE Final   C Diff toxin NEGATIVE NEGATIVE Final   C Diff interpretation No C. difficile detected.  Final    Comment: Performed at [facility], [place] [address] [place], [place] [address]      Studies: No results  found.    Bonnielee Haff, MD  [facility] 10/13/2020  If 7PM-7AM, please contact night-coverage

## 2020-10-14 LAB — CBC
HCT: 32.1 % — ABNORMAL LOW (ref 36.0–46.0)
Hemoglobin: 10.8 g/dL — ABNORMAL LOW (ref 12.0–15.0)
MCH: 30.8 pg (ref 26.0–34.0)
MCHC: 33.6 g/dL (ref 30.0–36.0)
MCV: 91.5 fL (ref 80.0–100.0)
Platelets: 256 10*3/uL (ref 150–400)
RBC: 3.51 MIL/uL — ABNORMAL LOW (ref 3.87–5.11)
RDW: 14.4 % (ref 11.5–15.5)
WBC: 13.3 10*3/uL — ABNORMAL HIGH (ref 4.0–10.5)
nRBC: 0 % (ref 0.0–0.2)

## 2020-10-14 LAB — BASIC METABOLIC PANEL
Anion gap: 9 (ref 5–15)
BUN: 10 mg/dL (ref 8–23)
CO2: 32 mmol/L (ref 22–32)
Calcium: 8.6 mg/dL — ABNORMAL LOW (ref 8.9–10.3)
Chloride: 95 mmol/L — ABNORMAL LOW (ref 98–111)
Creatinine, Ser: 0.62 mg/dL (ref 0.44–1.00)
GFR, Estimated: >60 mL/min (ref >60)
Glucose, Bld: 112 mg/dL — ABNORMAL HIGH (ref 70–99)
Potassium: 4.3 mmol/L (ref 3.5–5.1)
Sodium: 136 mmol/L (ref 135–145)

## 2020-10-14 LAB — PROCALCITONIN: Procalcitonin: 0.53 ng/mL

## 2020-10-14 MED ORDER — ALUM & MAG HYDROXIDE-SIMETH 200-200-20 MG/5ML PO SUSP
15.0000 mL | Freq: Four times a day (QID) | ORAL | Status: DC | PRN
  Administered 2020-10-14 – 2020-10-15 (×2): 15 mL via ORAL
  Filled 2020-10-14 (×2): qty 30

## 2020-10-14 MED ORDER — SODIUM CHLORIDE 0.9 % IV SOLN
INTRAVENOUS | Status: DC | PRN
  Administered 2020-10-14 – 2020-10-18 (×3): 500 mL via INTRAVENOUS

## 2020-10-14 NOTE — Plan of Care (Signed)

## 2020-10-14 NOTE — Progress Notes (Signed)
PROGRESS NOTE  [name] Price [id number] DOB: 14-Apr-1940 DOA: 10/08/2020 PCP: Tonia Ghent, MD   LOS: 6 days    Brief narrative: [name] Price is a 80 y.o. female with past medical history of anxiety, depression presented to the hospital with right foot rash and blister with new onset diarrhea.  Patient recently had some itching around the right foot and had been prescribed for triamcinolone for 2 weeks but continued to have rash and itchiness which progressed.  He went to see her dermatologist who prescribed topical dexamethasone for a week but started having blisters and painful rash without any fever.  She then came into the ED and DVT study was negative and was given Keflex but started having multiple episodes of watery diarrhea and increasing blister and redness.  Patient then presented back to the ED where she was noted to be mildly tachycardic with a temperature of around 99 F.  Lactate was elevated at 4.3 and creatinine elevated at 1.4 compared to 0.8.  CT scan of the foot did not show any evidence of foreign body but diffuse soft tissue swelling.  Patient was transferred to hospital for further evaluation and treatment.  Assessment/Plan:  Right foot cellulitis with hemorrhagic blister Patient was initially placed on doxycycline.  She was found to have worsening erythema and tenderness along with swelling.  Was changed over to cefazolin.  Was seen by orthopedics and underwent drainage of the large blister. Due to concern for exposure to poison ivy she was also placed on steroids but due to worsening erythema steroids were discontinued. CT scan of the lower extremity showed only soft tissue swelling without any abscess. Due to worsening erythema on 8/31 patient's antibiotic spectrum was broadened.  She was placed on vancomycin and cefepime.  Leg and foot was examined again today.  Seems to be stable for the most part with slight improvement over the anterior aspect of the lower  leg.  Pedal pulses are palpable. WBC noted to be 13.3 this morning.  Was 8.5 on 8/31.  Reason for this rise in WBC is not entirely clear though could be related to the 2 doses of Solu-Medrol she received few days ago.  She is afebrile.  Procalcitonin 0.53.  Early sepsis likely secondary to foot cellulitis. Evidenced by tachycardia, and elevated lactate level, and early end organ damage of a new AKI.  Sepsis physiology appears to have improved.  Continue to monitor.  Cultures without any growth  Normocytic anemia Drop in hemoglobin is dilutional.  No evidence of overt bleeding.  Anemia panel shows ferritin of 421, TIBC 198, iron 54, folate 13.4, vitamin B12 2515. Hemoglobin remains stable  Hypokalemia/hypomagnesemia/hypophosphatemia Improved with replacement.    Acute kidney injury Likely secondary to prerenal azotemia from severe diarrhea.  Renal function is back to baseline.   Diarrhea  C. difficile was negative.  On loperamide, continue probiotics while on antibiotic.  Appears to have resolved.  Weakness/debility.  Home health recommended at discharge  DVT prophylaxis: heparin injection 5,000 Units Start: 10/08/20 2200  Code Status: Full code  Family Communication:  No family at bedside.  Was unable to reach husband yesterday.  We will try again today.  Status is: Inpatient  Remains inpatient appropriate because:IV treatments appropriate due to intensity of illness or inability to take PO, and Inpatient level of care appropriate due to severity of illness  Dispo: The patient is from: Home              Anticipated  d/c is to: Home health as per PT recommendation.              Patient currently is not medically stable to d/c.   Difficult to place patient No  Consultants: orthopedics  Procedures: Incision and drainage of the blister on 10/09/2020  Anti-infectives:   Anti-infectives (From admission, onward)    Start     Dose/Rate Route Frequency Ordered Stop   10/12/20  1130  ceFEPIme (MAXIPIME) 2 g in sodium chloride 0.9 % 100 mL IVPB        2 g 200 mL/hr over 30 Minutes Intravenous Every 12 hours 10/12/20 1023     10/12/20 1130  vancomycin (VANCOREADY) IVPB 1250 mg/250 mL        1,250 mg 166.7 mL/hr over 90 Minutes Intravenous Every 24 hours 10/12/20 1030     10/12/20 1045  vancomycin (VANCOCIN) IVPB 1000 mg/200 mL premix  Status:  Discontinued        1,000 mg 200 mL/hr over 60 Minutes Intravenous  Once 10/12/20 0955 10/12/20 1015   10/12/20 1045  ceFEPIme (MAXIPIME) 2 g in sodium chloride 0.9 % 100 mL IVPB  Status:  Discontinued        2 g 200 mL/hr over 30 Minutes Intravenous  Once 10/12/20 0955 10/12/20 1015   10/11/20 1400  ceFAZolin (ANCEF) IVPB 1 g/50 mL premix  Status:  Discontinued        1 g 100 mL/hr over 30 Minutes Intravenous Every 8 hours 10/11/20 0902 10/12/20 0954   10/08/20 2200  doxycycline (VIBRA-TABS) tablet 100 mg  Status:  Discontinued        100 mg Oral Every 12 hours 10/08/20 1704 10/11/20 0902   10/08/20 1408  vancomycin variable dose per unstable renal function (pharmacist dosing)  Status:  Discontinued         Does not apply See admin instructions 10/08/20 1408 10/08/20 1703   10/08/20 1400  vancomycin (VANCOREADY) IVPB 1500 mg/300 mL        1,500 mg 150 mL/hr over 120 Minutes Intravenous  Once 10/08/20 1358 10/08/20 1826   10/08/20 1400  cefTRIAXone (ROCEPHIN) 2 g in sodium chloride 0.9 % 100 mL IVPB        2 g 200 mL/hr over 30 Minutes Intravenous  Once 10/08/20 1358 10/08/20 1514       Subjective: Patient mentions little bit of pain in her right foot.  No chest pain shortness of breath.  No nausea vomiting.  Has been able to bear weight on the right leg.   Objective: Vitals:   10/13/20 1409 10/14/20 0536  BP: 116/69 132/69  Pulse: 87 94  Resp: 17 17  Temp: 98.8 F (37.1 C) 98.7 F (37.1 C)  SpO2: 94% 98%    Intake/Output Summary (Last 24 hours) at 10/14/2020 0934 Last data filed at 10/13/2020 2224 Gross per 24  hour  Intake 240 ml  Output 1600 ml  Net -1360 ml    Filed Weights   10/08/20 2043  Weight: 70.5 kg   Body mass index is 27.53 kg/m.    Physical Exam:  General appearance: Awake alert.  In no distress Resp: Clear to auscultation bilaterally.  Normal effort Cardio: S1-S2 is normal regular.  No S3-S4.  No rubs murmurs or bruit GI: Abdomen is soft.  Nontender nondistended.  Bowel sounds are present normal.  No masses organomegaly Extremities: Dressing was removed.  Continues to have erythema involving primarily the foot.  Noted in the medial aspect  of the foot posteriorly.  Also extending up to the lower leg.  No changes today compared to yesterday. Neurologic: Alert and oriented x3.  No focal neurological deficits.      On presentation:    Data Review: I have personally reviewed the following laboratory data and studies,  CBC: Recent Labs  Lab 10/08/20 1110 10/09/20 0356 10/10/20 0301 10/11/20 0215 10/12/20 0033 10/14/20 0424  WBC 9.9 4.3 5.5 8.2 8.5 13.3*  NEUTROABS 8.4*  --   --   --   --   --   HGB 13.6 12.6 11.3* 11.0* 10.7* 10.8*  HCT 40.3 37.3 33.9* 31.6* 31.4* 32.1*  MCV 92.6 91.4 90.9 89.8 90.0 91.5  PLT 166 145* 146* 146* 179 [id number]    Basic Metabolic Panel: Recent Labs  Lab 10/10/20 0301 10/11/20 0215 10/12/20 0033 10/13/20 0024 10/14/20 0424  NA 131* 134* 137 136 136  K 3.6 3.4* 3.8 3.6 4.3  CL 102 102 100 98 95*  CO2 '22 26 30 30 '$ 32  GLUCOSE 132* 149* 177* 149* 112*  BUN 35* 24* '20 20 10  '$ CREATININE 0.78 0.60 0.66 0.65 0.62  CALCIUM 9.6 9.4 9.1 8.7* 8.6*  MG 1.6* 1.7 1.8  --   --   PHOS 1.5* 1.5* 3.1  --   --      CBG: No results for input(s): GLUCAP in the last 168 hours. Recent Results (from the past 240 hour(s))  Resp Panel by RT-PCR (Flu A&B, Covid) Nasopharyngeal Swab     Status: None   Collection Time: 10/08/20  1:57 PM   Specimen: Nasopharyngeal Swab; Nasopharyngeal(NP) swabs in vial transport medium  Result Value Ref Range  Status   SARS Coronavirus 2 by RT PCR NEGATIVE NEGATIVE Final    Comment: (NOTE) SARS-CoV-2 target nucleic acids are NOT DETECTED.  The SARS-CoV-2 RNA is generally detectable in upper respiratory specimens during the acute phase of infection. The lowest concentration of SARS-CoV-2 viral copies this assay can detect is 138 copies/mL. A negative result does not preclude SARS-Cov-2 infection and should not be used as the sole basis for treatment or other patient management decisions. A negative result may occur with  improper specimen collection/handling, submission of specimen other than nasopharyngeal swab, presence of viral mutation(s) within the areas targeted by this assay, and inadequate number of viral copies(<138 copies/mL). A negative result must be combined with clinical observations, patient history, and epidemiological information. The expected result is Negative.  Fact Sheet for Patients:  EntrepreneurPulse.com.au  Fact Sheet for Healthcare Providers:  IncredibleEmployment.be  This test is no t yet approved or cleared by the [place] FDA and  has been authorized for detection and/or diagnosis of SARS-CoV-2 by FDA under an Emergency Use Authorization (EUA). This EUA will remain  in effect (meaning this test can be used) for the duration of the COVID-19 declaration under Section 564(b)(1) of the Act, 21 U.S.C.section 360bbb-3(b)(1), unless the authorization is terminated  or revoked sooner.       Influenza A by PCR NEGATIVE NEGATIVE Final   Influenza B by PCR NEGATIVE NEGATIVE Final    Comment: (NOTE) The Xpert Xpress SARS-CoV-2/FLU/RSV plus assay is intended as an aid in the diagnosis of influenza from Nasopharyngeal swab specimens and should not be used as a sole basis for treatment. Nasal washings and aspirates are unacceptable for Xpert Xpress SARS-CoV-2/FLU/RSV testing.  Fact Sheet for  Patients: EntrepreneurPulse.com.au  Fact Sheet for Healthcare Providers: IncredibleEmployment.be  This test is not yet approved or cleared  by the [place] and has been authorized for detection and/or diagnosis of SARS-CoV-2 by FDA under an Emergency Use Authorization (EUA). This EUA will remain in effect (meaning this test can be used) for the duration of the COVID-19 declaration under Section 564(b)(1) of the Act, 21 U.S.C. section 360bbb-3(b)(1), unless the authorization is terminated or revoked.  Performed at [facility], [place] [address], [place], [place] [address]   Blood Culture (routine x 2)     Status: None   Collection Time: 10/08/20  2:05 PM   Specimen: BLOOD  Result Value Ref Range Status   Specimen Description BLOOD LEFT ANTECUBITAL  Final   Special Requests   Final    BOTTLES DRAWN AEROBIC AND ANAEROBIC Blood Culture adequate volume   Culture   Final    NO GROWTH 5 DAYS Performed at [facility], [place] [address] [place], [place] [address]    Report Status 10/13/2020 FINAL  Final  Blood Culture (routine x 2)     Status: None   Collection Time: 10/08/20  2:14 PM   Specimen: BLOOD  Result Value Ref Range Status   Specimen Description BLOOD SITE NOT SPECIFIED  Final   Special Requests   Final    BOTTLES DRAWN AEROBIC AND ANAEROBIC Blood Culture results may not be optimal due to an inadequate volume of blood received in culture bottles   Culture   Final    NO GROWTH 5 DAYS Performed at [facility], [place] [address], [place], [place] [address]    Report Status 10/13/2020 FINAL  Final  C Difficile Quick Screen w PCR reflex     Status: None   Collection Time: 10/08/20  4:24 PM   Specimen: STOOL  Result Value Ref Range Status   C Diff antigen NEGATIVE NEGATIVE Final   C Diff toxin NEGATIVE NEGATIVE Final   C Diff interpretation No C. difficile detected.  Final    Comment: Performed at [place], [place] [address], [place], [place] [address]      Studies: No results found.    Bonnielee Haff, MD  [facility] 10/14/2020  If 7PM-7AM, please contact night-coverage

## 2020-10-14 NOTE — Progress Notes (Signed)
Occupational Therapy Treatment Patient Details Name: [name] Price MRN: [id number] DOB: 1940/02/23 Today's Date: 10/14/2020    History of present illness Pt is a 80 y/o female admitted 8/26 secondary to R foot cellulitis and early sepsis. PMH includes breast cancer and bilateral THA.   OT comments  Patient in bed with R foot partially wrapped with drainage.  OT addressed AE training for LB dressing and UB bathing and grooming.  Patient was setup to perform UB bathing and grooming tasks and min assist with vcs for AE use for LB dressing using reacher.  Acute OT to continue to follow.   Follow Up Recommendations  Home health OT;Supervision/Assistance - 24 hour    Equipment Recommendations  3 in 1 bedside commode    Recommendations for Other Services      Precautions / Restrictions Precautions Precautions: Fall Precaution Comments: Rt foot cellulitis with edema and blisters Restrictions Weight Bearing Restrictions: Yes RLE Weight Bearing: Weight bearing as tolerated       Mobility Bed Mobility Overal bed mobility: Needs Assistance Bed Mobility: Supine to Sit;Sit to Supine     Supine to sit: Supervision;HOB elevated Sit to supine: Supervision;HOB elevated   General bed mobility comments: Use of bedrail to pull to sitting position. Able to navigate LEs off bed.    Transfers                      Balance Overall balance assessment: Needs assistance Sitting-balance support: No upper extremity supported;Feet supported Sitting balance-Leahy Scale: Good Sitting balance - Comments: Able to perform self care tasks unsupported seated on eob                                   ADL either performed or assessed with clinical judgement   ADL Overall ADL's : Needs assistance/impaired     Grooming: Wash/dry hands;Wash/dry face;Oral care;Set up;Sitting Grooming Details (indicate cue type and reason): Performed seated on eob due to RLE foot drainage Upper  Body Bathing: Set up;Sitting           Lower Body Dressing: Minimal assistance;With adaptive equipment;Sitting/lateral leans Lower Body Dressing Details (indicate cue type and reason): AE training with reacher               General ADL Comments: R foot drainage; performed ADL tasks seated     Vision       Perception     Praxis      Cognition Arousal/Alertness: Awake/alert Behavior During Therapy: WFL for tasks assessed/performed Overall Cognitive Status: Within Functional Limits for tasks assessed                                          Exercises     Shoulder Instructions       General Comments Patient had drainage on RLE    Pertinent Vitals/ Pain       Pain Assessment: 0-10 Pain Score: 4  Faces Pain Scale: Hurts little more Pain Location: R foot Pain Descriptors / Indicators: Discomfort Pain Intervention(s): Repositioned  Home Living                                          Prior Functioning/Environment  Frequency  Min 2X/week        Progress Toward Goals  OT Goals(current goals can now be found in the care plan section)  Progress towards OT goals: Progressing toward goals  Acute Rehab OT Goals Patient Stated Goal: to decrease pain OT Goal Formulation: With patient Time For Goal Achievement: 10/25/20 Potential to Achieve Goals: Good ADL Goals Pt Will Perform Grooming: with modified independence;standing Pt Will Perform Upper Body Dressing: Independently Pt Will Perform Lower Body Dressing: with modified independence;sit to/from stand Pt Will Transfer to Toilet: with modified independence;ambulating Pt Will Perform Toileting - Clothing Manipulation and hygiene: with modified independence;sit to/from stand Pt Will Perform Tub/Shower Transfer: Tub transfer;Shower transfer;tub bench;rolling walker  Plan Discharge plan remains appropriate;Frequency remains appropriate    Co-evaluation                  AM-PAC OT "6 Clicks" Daily Activity     Outcome Measure   Help from another person eating meals?: None Help from another person taking care of personal grooming?: A Little Help from another person toileting, which includes using toliet, bedpan, or urinal?: A Little Help from another person bathing (including washing, rinsing, drying)?: A Little Help from another person to put on and taking off regular upper body clothing?: None Help from another person to put on and taking off regular lower body clothing?: A Little 6 Click Score: 20    End of Session    OT Visit Diagnosis: Unsteadiness on feet (R26.81);Muscle weakness (generalized) (M62.81);Pain Pain - Right/Left: Right Pain - part of body: Leg;Ankle and joints of foot   Activity Tolerance Patient tolerated treatment well   Patient Left in bed;with call bell/phone within reach;with bed alarm set   Nurse Communication Other (comment)        [id number] OT Time Calculation (min): 25 min  Charges: OT General Charges $OT Visit: 1 Visit OT Treatments $Self Care/Home Management : 23-37 mins  Lodema Hong, [place] 10/14/2020, 8:41 AM

## 2020-10-14 NOTE — Progress Notes (Signed)
Pharmacy Antibiotic Note  [name] Price is a 80 y.o. female admitted on 10/08/2020 with cellulitis.  Pharmacy has been consulted for Vanco, Cefepime dosing.  CC/HPI:  pain and swelling in her right foot and lower leg, diarrhea from Keflex  ID: Rt foot swelling, cellulitis. Abx broadened due to con't worsening or erythema.  XR Soft tissue swelling with no acute osseous abnormality identified - Afebrile, WBC 8.5>13.3; LA 4.7>1.9, Scr 0.62, PC 0.52  Cephalexin started 8/26 prior to arrival Ceftriaxone 8/27 x1 Doxy 8/27>8/30  Ancef 8/30-8/31 Vancomycin 8/27 x1, 8/31>> Cefepime 8/31>>  8/27: BC x 2: neg 8/27: Cdiff: negative  Plan: Vancomycin '1250mg'$  IV Q q24h hrs. Goal AUC 400-550. Expected AUC: 512 SCr used: 0.8 Peak trough Monday if continued  Cefepime 2g IV q12    Height: '5\' 3"'$  (160 cm) Weight: 70.5 kg (155 lb 6.8 oz) IBW/kg (Calculated) : 52.4  Temp (24hrs), Avg:98.8 F (37.1 C), Min:98.7 F (37.1 C), Max:98.8 F (37.1 C)  Recent Labs  Lab 10/08/20 1110 10/08/20 1851 10/09/20 0356 10/09/20 1151 10/10/20 0301 10/11/20 0215 10/12/20 0033 10/13/20 0024 10/14/20 0424  WBC 9.9  --  4.3  --  5.5 8.2 8.5  --  13.3*  CREATININE 1.48*  --  1.26*  --  0.78 0.60 0.66 0.65 0.62  LATICACIDVEN 4.7* 3.0*  --  1.9  --   --   --   --   --      Estimated Creatinine Clearance: 55.4 mL/min (by C-G formula based on SCr of 0.62 mg/dL).    Allergies  Allergen Reactions   Codeine Nausea And Vomiting   Kelsi Benham S. Alford Highland, PharmD, BCPS Clinical Staff Pharmacist [email or web address]  Wayland Salinas 10/14/2020 8:32 AM

## 2020-10-15 LAB — BASIC METABOLIC PANEL
Anion gap: 7 (ref 5–15)
BUN: 10 mg/dL (ref 8–23)
CO2: 30 mmol/L (ref 22–32)
Calcium: 8.8 mg/dL — ABNORMAL LOW (ref 8.9–10.3)
Chloride: 99 mmol/L (ref 98–111)
Creatinine, Ser: 0.62 mg/dL (ref 0.44–1.00)
GFR, Estimated: >60 mL/min (ref >60)
Glucose, Bld: 111 mg/dL — ABNORMAL HIGH (ref 70–99)
Potassium: 4.3 mmol/L (ref 3.5–5.1)
Sodium: 136 mmol/L (ref 135–145)

## 2020-10-15 LAB — CBC
HCT: 32.7 % — ABNORMAL LOW (ref 36.0–46.0)
Hemoglobin: 10.6 g/dL — ABNORMAL LOW (ref 12.0–15.0)
MCH: 30.5 pg (ref 26.0–34.0)
MCHC: 32.4 g/dL (ref 30.0–36.0)
MCV: 94.2 fL (ref 80.0–100.0)
Platelets: 288 10*3/uL (ref 150–400)
RBC: 3.47 MIL/uL — ABNORMAL LOW (ref 3.87–5.11)
RDW: 14.5 % (ref 11.5–15.5)
WBC: 13.8 10*3/uL — ABNORMAL HIGH (ref 4.0–10.5)
nRBC: 0 % (ref 0.0–0.2)

## 2020-10-15 LAB — PROCALCITONIN: Procalcitonin: 0.33 ng/mL

## 2020-10-15 MED ORDER — PANTOPRAZOLE SODIUM 40 MG PO TBEC
40.0000 mg | DELAYED_RELEASE_TABLET | Freq: Every day | ORAL | Status: DC
  Administered 2020-10-15 – 2020-10-19 (×5): 40 mg via ORAL
  Filled 2020-10-15 (×5): qty 1

## 2020-10-15 MED ORDER — FUROSEMIDE 10 MG/ML IJ SOLN
40.0000 mg | Freq: Once | INTRAMUSCULAR | Status: AC
  Administered 2020-10-15: 40 mg via INTRAVENOUS
  Filled 2020-10-15: qty 4

## 2020-10-15 NOTE — Plan of Care (Signed)
  Problem: Health Behavior/Discharge Planning: Goal: Ability to manage health-related needs will improve Outcome: Not Progressing   Problem: Clinical Measurements: Goal: Ability to maintain clinical measurements within normal limits will improve Outcome: Not Progressing Goal: Will remain free from infection Outcome: Not Progressing Goal: Diagnostic test results will improve Outcome: Not Progressing Goal: Respiratory complications will improve Outcome: Not Progressing Goal: Cardiovascular complication will be avoided Outcome: Not Progressing   Problem: Activity: Goal: Risk for activity intolerance will decrease Outcome: Not Progressing   Problem: Coping: Goal: Level of anxiety will decrease Outcome: Not Progressing   Problem: Elimination: Goal: Will not experience complications related to bowel motility Outcome: Not Progressing Goal: Will not experience complications related to urinary retention Outcome: Not Progressing   Problem: Pain Managment: Goal: General experience of comfort will improve Outcome: Not Progressing   Problem: Safety: Goal: Ability to remain free from injury will improve Outcome: Not Progressing

## 2020-10-15 NOTE — Progress Notes (Addendum)
PROGRESS NOTE  CARALINA Price [id number] DOB: Aug 08, 1940 DOA: 10/08/2020 PCP: Tonia Ghent, MD   LOS: 7 days    Brief narrative: [name] Price is a 80 y.o. female with past medical history of anxiety, depression presented to the hospital with right foot rash and blister with new onset diarrhea.  Patient recently had some itching around the right foot and had been prescribed for triamcinolone for 2 weeks but continued to have rash and itchiness which progressed.  He went to see her dermatologist who prescribed topical dexamethasone for a week but started having blisters and painful rash without any fever.  She then came into the ED and DVT study was negative and was given Keflex but started having multiple episodes of watery diarrhea and increasing blister and redness.  Patient then presented back to the ED where she was noted to be mildly tachycardic with a temperature of around 99 F.  Lactate was elevated at 4.3 and creatinine elevated at 1.4 compared to 0.8.  CT scan of the foot did not show any evidence of foreign body but diffuse soft tissue swelling.  Patient was transferred to hospital for further evaluation and treatment.  Assessment/Plan:  Right foot cellulitis with hemorrhagic blister Patient was initially placed on doxycycline.  She was found to have worsening erythema and tenderness along with swelling.  Was changed over to cefazolin.  Was seen by orthopedics and underwent drainage of the large blister. Due to concern for exposure to poison ivy she was also placed on steroids but due to worsening erythema steroids were discontinued. CT scan of the lower extremity showed only soft tissue swelling without any abscess. Due to worsening erythema on 8/31 patient's antibiotic spectrum was broadened.  She was placed on vancomycin and cefepime.   Patient's foot and lower leg were examined again today.  Erythema appears to be about the same as yesterday.  No significant changes  noted.  Does not appear to be any worse either.  Temperature noted to be 99.2 last night.  WBC noted to be 13.8 from 13.3 yesterday.  Elevation could be due to steroids that she was given for 2 days but it remains unclear.  Procalcitonin went from 0.53-0.33.  Keep right leg elevated.  Another dose of Lasix to be given today.    Early sepsis likely secondary to foot cellulitis. Evidenced by tachycardia, and elevated lactate level, and early end organ damage of a new AKI.  Sepsis physiology appears to have improved.  Continue to monitor.  Cultures without any growth.  GERD Patient with significant acid reflux.  We will initiate PPI  Normocytic anemia Drop in hemoglobin is dilutional.  No evidence of overt bleeding.  Anemia panel shows ferritin of 421, TIBC 198, iron 54, folate 13.4, vitamin B12 2515. Hemoglobin remains stable  Hypokalemia/hypomagnesemia/hypophosphatemia Improved with replacement.    Acute kidney injury Likely secondary to prerenal azotemia from severe diarrhea.  Renal function is back to baseline.   Diarrhea  C. difficile was negative.  On loperamide, continue probiotics while on antibiotic.  Appears to have resolved.  Weakness/debility.  Home health recommended at discharge  DVT prophylaxis: heparin injection 5,000 Units Start: 10/08/20 2200  Code Status: Full code  Family Communication:  No family at bedside.  Discussed with patient and husband over the phone.  Status is: Inpatient  Remains inpatient appropriate because:IV treatments appropriate due to intensity of illness or inability to take PO, and Inpatient level of care appropriate due to severity of illness  Dispo: The patient is from: Home              Anticipated d/c is to: Home health as per PT recommendation.              Patient currently is not medically stable to d/c.   Difficult to place patient No  Consultants: orthopedics  Procedures: Incision and drainage of the blister on  10/09/2020  Anti-infectives:   Anti-infectives (From admission, onward)    Start     Dose/Rate Route Frequency Ordered Stop   10/12/20 1130  ceFEPIme (MAXIPIME) 2 g in sodium chloride 0.9 % 100 mL IVPB        2 g 200 mL/hr over 30 Minutes Intravenous Every 12 hours 10/12/20 1023     10/12/20 1130  vancomycin (VANCOREADY) IVPB 1250 mg/250 mL        1,250 mg 166.7 mL/hr over 90 Minutes Intravenous Every 24 hours 10/12/20 1030     10/12/20 1045  vancomycin (VANCOCIN) IVPB 1000 mg/200 mL premix  Status:  Discontinued        1,000 mg 200 mL/hr over 60 Minutes Intravenous  Once 10/12/20 0955 10/12/20 1015   10/12/20 1045  ceFEPIme (MAXIPIME) 2 g in sodium chloride 0.9 % 100 mL IVPB  Status:  Discontinued        2 g 200 mL/hr over 30 Minutes Intravenous  Once 10/12/20 0955 10/12/20 1015   10/11/20 1400  ceFAZolin (ANCEF) IVPB 1 g/50 mL premix  Status:  Discontinued        1 g 100 mL/hr over 30 Minutes Intravenous Every 8 hours 10/11/20 0902 10/12/20 0954   10/08/20 2200  doxycycline (VIBRA-TABS) tablet 100 mg  Status:  Discontinued        100 mg Oral Every 12 hours 10/08/20 1704 10/11/20 0902   10/08/20 1408  vancomycin variable dose per unstable renal function (pharmacist dosing)  Status:  Discontinued         Does not apply See admin instructions 10/08/20 1408 10/08/20 1703   10/08/20 1400  vancomycin (VANCOREADY) IVPB 1500 mg/300 mL        1,500 mg 150 mL/hr over 120 Minutes Intravenous  Once 10/08/20 1358 10/08/20 1826   10/08/20 1400  cefTRIAXone (ROCEPHIN) 2 g in sodium chloride 0.9 % 100 mL IVPB        2 g 200 mL/hr over 30 Minutes Intravenous  Once 10/08/20 1358 10/08/20 1514       Subjective: Patient mentions that pain in the right foot is tolerable.  She is able to bear weight on it.  Denies any nausea vomiting or shortness of breath.  Objective: Vitals:   10/14/20 2142 10/15/20 0634  BP: 102/86 (!) 132/56  Pulse: 100 90  Resp: 18 18  Temp: 99.2 F (37.3 C)   SpO2:  95% 96%    Intake/Output Summary (Last 24 hours) at 10/15/2020 1035 Last data filed at 10/14/2020 2206 Gross per 24 hour  Intake 379.22 ml  Output 450 ml  Net -70.78 ml    Filed Weights   10/08/20 2043  Weight: 70.5 kg   Body mass index is 27.53 kg/m.    Physical Exam:  General appearance: Awake alert.  In no distress Resp: Clear to auscultation bilaterally.  Normal effort Cardio: S1-S2 is normal regular.  No S3-S4.  No rubs murmurs or bruit GI: Abdomen is soft.  Nontender nondistended.  Bowel sounds are present normal.  No masses organomegaly Extremities: Continues to have erythema and  swelling involving the right lower leg as well as foot.  Pulses are present.  Erythema extending to the medial aspect of the foot.  No significant changes compared to yesterday.  Sloughing of skin noted from her recent large blister. Neurologic: Alert and oriented x3.  No focal neurological deficits.      On presentation:    Data Review: I have personally reviewed the following laboratory data and studies,  CBC: Recent Labs  Lab 10/08/20 1110 10/09/20 0356 10/10/20 0301 10/11/20 0215 10/12/20 0033 10/14/20 0424 10/15/20 0150  WBC 9.9   < > 5.5 8.2 8.5 13.3* 13.8*  NEUTROABS 8.4*  --   --   --   --   --   --   HGB 13.6   < > 11.3* 11.0* 10.7* 10.8* 10.6*  HCT 40.3   < > 33.9* 31.6* 31.4* 32.1* 32.7*  MCV 92.6   < > 90.9 89.8 90.0 91.5 94.2  PLT 166   < > 146* 146* 179 256 288   < > = values in this interval not displayed.    Basic Metabolic Panel: Recent Labs  Lab 10/10/20 0301 10/11/20 0215 10/12/20 0033 10/13/20 0024 10/14/20 0424 10/15/20 0150  NA 131* 134* 137 136 136 136  K 3.6 3.4* 3.8 3.6 4.3 4.3  CL 102 102 100 98 95* 99  CO2 '22 26 30 30 '$ 32 30  GLUCOSE 132* 149* 177* 149* 112* 111*  BUN 35* 24* '20 20 10 10  '$ CREATININE 0.78 0.60 0.66 0.65 0.62 0.62  CALCIUM 9.6 9.4 9.1 8.7* 8.6* 8.8*  MG 1.6* 1.7 1.8  --   --   --   PHOS 1.5* 1.5* 3.1  --   --   --       CBG: No results for input(s): GLUCAP in the last 168 hours. Recent Results (from the past 240 hour(s))  Resp Panel by RT-PCR (Flu A&B, Covid) Nasopharyngeal Swab     Status: None   Collection Time: 10/08/20  1:57 PM   Specimen: Nasopharyngeal Swab; Nasopharyngeal(NP) swabs in vial transport medium  Result Value Ref Range Status   SARS Coronavirus 2 by RT PCR NEGATIVE NEGATIVE Final    Comment: (NOTE) SARS-CoV-2 target nucleic acids are NOT DETECTED.  The SARS-CoV-2 RNA is generally detectable in upper respiratory specimens during the acute phase of infection. The lowest concentration of SARS-CoV-2 viral copies this assay can detect is 138 copies/mL. A negative result does not preclude SARS-Cov-2 infection and should not be used as the sole basis for treatment or other patient management decisions. A negative result may occur with  improper specimen collection/handling, submission of specimen other than nasopharyngeal swab, presence of viral mutation(s) within the areas targeted by this assay, and inadequate number of viral copies(<138 copies/mL). A negative result must be combined with clinical observations, patient history, and epidemiological information. The expected result is Negative.  Fact Sheet for Patients:  EntrepreneurPulse.com.au  Fact Sheet for Healthcare Providers:  IncredibleEmployment.be  This test is no t yet approved or cleared by the [place] FDA and  has been authorized for detection and/or diagnosis of SARS-CoV-2 by FDA under an Emergency Use Authorization (EUA). This EUA will remain  in effect (meaning this test can be used) for the duration of the COVID-19 declaration under Section 564(b)(1) of the Act, 21 U.S.C.section 360bbb-3(b)(1), unless the authorization is terminated  or revoked sooner.       Influenza A by PCR NEGATIVE NEGATIVE Final   Influenza B by  PCR NEGATIVE NEGATIVE Final    Comment:  (NOTE) The Xpert Xpress SARS-CoV-2/FLU/RSV plus assay is intended as an aid in the diagnosis of influenza from Nasopharyngeal swab specimens and should not be used as a sole basis for treatment. Nasal washings and aspirates are unacceptable for Xpert Xpress SARS-CoV-2/FLU/RSV testing.  Fact Sheet for Patients: EntrepreneurPulse.com.au  Fact Sheet for Healthcare Providers: IncredibleEmployment.be  This test is not yet approved or cleared by the [place] FDA and has been authorized for detection and/or diagnosis of SARS-CoV-2 by FDA under an Emergency Use Authorization (EUA). This EUA will remain in effect (meaning this test can be used) for the duration of the COVID-19 declaration under Section 564(b)(1) of the Act, 21 U.S.C. section 360bbb-3(b)(1), unless the authorization is terminated or revoked.  Performed at [facility], [place] [address], [place], [place] [address]   Blood Culture (routine x 2)     Status: None   Collection Time: 10/08/20  2:05 PM   Specimen: BLOOD  Result Value Ref Range Status   Specimen Description BLOOD LEFT ANTECUBITAL  Final   Special Requests   Final    BOTTLES DRAWN AEROBIC AND ANAEROBIC Blood Culture adequate volume   Culture   Final    NO GROWTH 5 DAYS Performed at [facility], [place] [address], [place], [place] [address]    Report Status 10/13/2020 FINAL  Final  Blood Culture (routine x 2)     Status: None   Collection Time: 10/08/20  2:14 PM   Specimen: BLOOD  Result Value Ref Range Status   Specimen Description BLOOD SITE NOT SPECIFIED  Final   Special Requests   Final    BOTTLES DRAWN AEROBIC AND ANAEROBIC Blood Culture results may not be optimal due to an inadequate volume of blood received in culture bottles   Culture   Final    NO GROWTH 5 DAYS Performed at [facility], [place] [address], [place], [place] [address]    Report Status 10/13/2020 FINAL  Final  C Difficile Quick  Screen w PCR reflex     Status: None   Collection Time: 10/08/20  4:24 PM   Specimen: STOOL  Result Value Ref Range Status   C Diff antigen NEGATIVE NEGATIVE Final   C Diff toxin NEGATIVE NEGATIVE Final   C Diff interpretation No C. difficile detected.  Final    Comment: Performed at [facility], [place] [address] [place], [place] [address]      Studies: No results found.    Bonnielee Haff, MD  [facility] 10/15/2020  If 7PM-7AM, please contact night-coverage

## 2020-10-16 ENCOUNTER — Inpatient Hospital Stay (HOSPITAL_COMMUNITY): Payer: Medicare Other

## 2020-10-16 DIAGNOSIS — I471 Supraventricular tachycardia: Secondary | ICD-10-CM

## 2020-10-16 DIAGNOSIS — R008 Other abnormalities of heart beat: Secondary | ICD-10-CM

## 2020-10-16 LAB — BASIC METABOLIC PANEL
Anion gap: 3 — ABNORMAL LOW (ref 5–15)
BUN: 11 mg/dL (ref 8–23)
CO2: 30 mmol/L (ref 22–32)
Calcium: 8.1 mg/dL — ABNORMAL LOW (ref 8.9–10.3)
Chloride: 100 mmol/L (ref 98–111)
Creatinine, Ser: 0.71 mg/dL (ref 0.44–1.00)
GFR, Estimated: >60 mL/min (ref >60)
Glucose, Bld: 129 mg/dL — ABNORMAL HIGH (ref 70–99)
Potassium: 4.3 mmol/L (ref 3.5–5.1)
Sodium: 133 mmol/L — ABNORMAL LOW (ref 135–145)

## 2020-10-16 LAB — ECHOCARDIOGRAM COMPLETE
AR max vel: 2.36 cm2
AV Area VTI: 2.37 cm2
AV Area mean vel: 2.28 cm2
AV Mean grad: 5 mmHg
AV Peak grad: 9.9 mmHg
Ao pk vel: 1.57 m/s
Area-P 1/2: 4.04 cm2
Calc EF: 60.6 %
Height: 63 in
MV VTI: 2.36 cm2
P 1/2 time: 438 msec
S' Lateral: 3 cm
Single Plane A2C EF: 57.9 %
Single Plane A4C EF: 62 %
Weight: 2486.79 oz

## 2020-10-16 LAB — CBC WITH DIFFERENTIAL/PLATELET
Abs Immature Granulocytes: 0.37 10*3/uL — ABNORMAL HIGH (ref 0.00–0.07)
Basophils Absolute: 0 10*3/uL (ref 0.0–0.1)
Basophils Relative: 0 %
Eosinophils Absolute: 0.2 10*3/uL (ref 0.0–0.5)
Eosinophils Relative: 2 %
HCT: 29.2 % — ABNORMAL LOW (ref 36.0–46.0)
Hemoglobin: 9.4 g/dL — ABNORMAL LOW (ref 12.0–15.0)
Immature Granulocytes: 3 %
Lymphocytes Relative: 14 %
Lymphs Abs: 1.7 10*3/uL (ref 0.7–4.0)
MCH: 30.4 pg (ref 26.0–34.0)
MCHC: 32.2 g/dL (ref 30.0–36.0)
MCV: 94.5 fL (ref 80.0–100.0)
Monocytes Absolute: 0.7 10*3/uL (ref 0.1–1.0)
Monocytes Relative: 6 %
Neutro Abs: 9.4 10*3/uL — ABNORMAL HIGH (ref 1.7–7.7)
Neutrophils Relative %: 75 %
Platelets: 303 10*3/uL (ref 150–400)
RBC: 3.09 MIL/uL — ABNORMAL LOW (ref 3.87–5.11)
RDW: 14.5 % (ref 11.5–15.5)
WBC: 12.3 10*3/uL — ABNORMAL HIGH (ref 4.0–10.5)
nRBC: 0 % (ref 0.0–0.2)

## 2020-10-16 LAB — MAGNESIUM: Magnesium: 2.2 mg/dL (ref 1.7–2.4)

## 2020-10-16 MED ORDER — METOPROLOL TARTRATE 12.5 MG HALF TABLET
12.5000 mg | ORAL_TABLET | Freq: Every day | ORAL | Status: DC
  Administered 2020-10-16 – 2020-10-17 (×2): 12.5 mg via ORAL
  Filled 2020-10-16 (×2): qty 1

## 2020-10-16 MED ORDER — FUROSEMIDE 10 MG/ML IJ SOLN
20.0000 mg | Freq: Once | INTRAMUSCULAR | Status: AC
  Administered 2020-10-16: 20 mg via INTRAVENOUS
  Filled 2020-10-16: qty 2

## 2020-10-16 NOTE — Progress Notes (Signed)
PROGRESS NOTE  [name] Price [id number] DOB: [birth year] DOA: 10/08/2020 PCP: Tonia Ghent, MD   LOS: 8 days    Brief narrative: [name] Price is a 80 y.o. female with past medical history of anxiety, depression presented to the hospital with right foot rash and blister with new onset diarrhea.  Patient recently had some itching around the right foot and had been prescribed for triamcinolone for 2 weeks but continued to have rash and itchiness which progressed.  He went to see her dermatologist who prescribed topical dexamethasone for a week but started having blisters and painful rash without any fever.  She then came into the ED and DVT study was negative and was given Keflex but started having multiple episodes of watery diarrhea and increasing blister and redness.  Patient then presented back to the ED where she was noted to be mildly tachycardic with a temperature of around 99 F.  Lactate was elevated at 4.3 and creatinine elevated at 1.4 compared to 0.8.  CT scan of the foot did not show any evidence of foreign body but diffuse soft tissue swelling.  Patient was transferred to hospital for further evaluation and treatment.  Assessment/Plan:  Right foot cellulitis with hemorrhagic blister Patient was initially placed on doxycycline.  She was found to have worsening erythema and tenderness along with swelling.  Was changed over to cefazolin.  Was seen by orthopedics and underwent drainage of the large blister. Due to concern for exposure to poison ivy she was also placed on steroids but due to worsening erythema steroids were discontinued. CT scan of the lower extremity showed only soft tissue swelling without any abscess. Due to worsening erythema on 8/31 patient's antibiotic spectrum was broadened.  She was placed on vancomycin and cefepime.   Patient's cellulitis has been very slow to respond.  Continues to have a lot of bruising.  However erythema seems to be better today.   We will request wound care nurse to reevaluate.  Likely elevated.  Another dose of furosemide today.  Remains afebrile.  BBC is 12.3 today.   Procalcitonin went from 0.53-0.33.   We will plan to keep her on current IV antibiotics for at least 7 days and then determine transition to oral antibiotics  Early sepsis likely secondary to foot cellulitis. Evidenced by tachycardia, and elevated lactate level, and early end organ damage of a new AKI.  Sepsis physiology resolved.  Cultures without any growth.    PSVT Occasional episodes of SVT noted on telemetry.  PVCs also noted.  Asymptomatic.  We will check thyroid function test.  Low-dose beta-blocker.  Echocardiogram.  GERD Continue PPI  Normocytic anemia Drop in hemoglobin is dilutional.  No evidence of overt bleeding.  Anemia panel shows ferritin of 421, TIBC 198, iron 54, folate 13.4, vitamin B12 2515. Hemoglobin remains stable  Hypokalemia/hypomagnesemia/hypophosphatemia Improved with replacement.    Acute kidney injury Likely secondary to prerenal azotemia from severe diarrhea.  Renal function is back to baseline.   Diarrhea  C. difficile was negative.  On loperamide, continue probiotics while on antibiotic.  Appears to have resolved.  Weakness/debility.  Home health recommended at discharge  DVT prophylaxis: heparin injection 5,000 Units Start: 10/08/20 2200  Code Status: Full code  Family Communication:  No family at bedside.  Discussed with patient and husband over the phone.  Status is: Inpatient  Remains inpatient appropriate because:IV treatments appropriate due to intensity of illness or inability to take PO, and Inpatient level of care appropriate  due to severity of illness  Dispo: The patient is from: Home              Anticipated d/c is to: Home health as per PT recommendation.              Patient currently is not medically stable to d/c.   Difficult to place patient  No  Consultants: orthopedics  Procedures: Incision and drainage of the blister on 10/09/2020  Anti-infectives:   Anti-infectives (From admission, onward)    Start     Dose/Rate Route Frequency Ordered Stop   10/12/20 1130  ceFEPIme (MAXIPIME) 2 g in sodium chloride 0.9 % 100 mL IVPB        2 g 200 mL/hr over 30 Minutes Intravenous Every 12 hours 10/12/20 1023     10/12/20 1130  vancomycin (VANCOREADY) IVPB 1250 mg/250 mL        1,250 mg 166.7 mL/hr over 90 Minutes Intravenous Every 24 hours 10/12/20 1030     10/12/20 1045  vancomycin (VANCOCIN) IVPB 1000 mg/200 mL premix  Status:  Discontinued        1,000 mg 200 mL/hr over 60 Minutes Intravenous  Once 10/12/20 0955 10/12/20 1015   10/12/20 1045  ceFEPIme (MAXIPIME) 2 g in sodium chloride 0.9 % 100 mL IVPB  Status:  Discontinued        2 g 200 mL/hr over 30 Minutes Intravenous  Once 10/12/20 0955 10/12/20 1015   10/11/20 1400  ceFAZolin (ANCEF) IVPB 1 g/50 mL premix  Status:  Discontinued        1 g 100 mL/hr over 30 Minutes Intravenous Every 8 hours 10/11/20 0902 10/12/20 0954   10/08/20 2200  doxycycline (VIBRA-TABS) tablet 100 mg  Status:  Discontinued        100 mg Oral Every 12 hours 10/08/20 1704 10/11/20 0902   10/08/20 1408  vancomycin variable dose per unstable renal function (pharmacist dosing)  Status:  Discontinued         Does not apply See admin instructions 10/08/20 1408 10/08/20 1703   10/08/20 1400  vancomycin (VANCOREADY) IVPB 1500 mg/300 mL        1,500 mg 150 mL/hr over 120 Minutes Intravenous  Once 10/08/20 1358 10/08/20 1826   10/08/20 1400  cefTRIAXone (ROCEPHIN) 2 g in sodium chloride 0.9 % 100 mL IVPB        2 g 200 mL/hr over 30 Minutes Intravenous  Once 10/08/20 1358 10/08/20 1514       Subjective: Patient mentions that pain in the right foot is better than before.  Denies any new complaints.  No chest pain shortness of breath nausea vomiting.    Objective: Vitals:   10/15/20 2300 10/16/20  0414  BP: 110/67 (!) 109/48  Pulse: 88 93  Resp: 16 16  Temp: 98 F (36.7 C) 97.8 F (36.6 C)  SpO2: 98% 96%    Intake/Output Summary (Last 24 hours) at 10/16/2020 1107 Last data filed at 10/15/2020 1241 Gross per 24 hour  Intake --  Output 800 ml  Net -800 ml    Filed Weights   10/08/20 2043  Weight: 70.5 kg   Body mass index is 27.53 kg/m.    Physical Exam:  General appearance: Awake alert.  In no distress Resp: Clear to auscultation bilaterally.  Normal effort Cardio: S1-S2 is normal regular.  No S3-S4.  No rubs murmurs or bruit GI: Abdomen is soft.  Nontender nondistended.  Bowel sounds are present normal.  No  masses organomegaly Extremities: Remains swollen but swelling appears to be improving.  Edema appears to be improving as well.  Pedal pulses are palpable. Neurologic: Alert and oriented x3.  No focal neurological deficits.      On presentation:    Data Review: I have personally reviewed the following laboratory data and studies,  CBC: Recent Labs  Lab 10/11/20 0215 10/12/20 0033 10/14/20 0424 10/15/20 0150 10/16/20 0447  WBC 8.2 8.5 13.3* 13.8* 12.3*  NEUTROABS  --   --   --   --  9.4*  HGB 11.0* 10.7* 10.8* 10.6* 9.4*  HCT 31.6* 31.4* 32.1* 32.7* 29.2*  MCV 89.8 90.0 91.5 94.2 94.5  PLT 146* 179 256 288 [id number]    Basic Metabolic Panel: Recent Labs  Lab 10/10/20 0301 10/11/20 0215 10/12/20 0033 10/13/20 0024 10/14/20 0424 10/15/20 0150 10/16/20 0039  NA 131* 134* 137 136 136 136 133*  K 3.6 3.4* 3.8 3.6 4.3 4.3 4.3  CL 102 102 100 98 95* 99 100  CO2 '22 26 30 30 '$ 32 30 30  GLUCOSE 132* 149* 177* 149* 112* 111* 129*  BUN 35* 24* '20 20 10 10 11  '$ CREATININE 0.78 0.60 0.66 0.65 0.62 0.62 0.71  CALCIUM 9.6 9.4 9.1 8.7* 8.6* 8.8* 8.1*  MG 1.6* 1.7 1.8  --   --   --   --   PHOS 1.5* 1.5* 3.1  --   --   --   --      CBG: No results for input(s): GLUCAP in the last 168 hours. Recent Results (from the past 240 hour(s))  Resp Panel by RT-PCR  (Flu A&B, Covid) Nasopharyngeal Swab     Status: None   Collection Time: 10/08/20  1:57 PM   Specimen: Nasopharyngeal Swab; Nasopharyngeal(NP) swabs in vial transport medium  Result Value Ref Range Status   SARS Coronavirus 2 by RT PCR NEGATIVE NEGATIVE Final    Comment: (NOTE) SARS-CoV-2 target nucleic acids are NOT DETECTED.  The SARS-CoV-2 RNA is generally detectable in upper respiratory specimens during the acute phase of infection. The lowest concentration of SARS-CoV-2 viral copies this assay can detect is 138 copies/mL. A negative result does not preclude SARS-Cov-2 infection and should not be used as the sole basis for treatment or other patient management decisions. A negative result may occur with  improper specimen collection/handling, submission of specimen other than nasopharyngeal swab, presence of viral mutation(s) within the areas targeted by this assay, and inadequate number of viral copies(<138 copies/mL). A negative result must be combined with clinical observations, patient history, and epidemiological information. The expected result is Negative.  Fact Sheet for Patients:  EntrepreneurPulse.com.au  Fact Sheet for Healthcare Providers:  IncredibleEmployment.be  This test is no t yet approved or cleared by the [place] FDA and  has been authorized for detection and/or diagnosis of SARS-CoV-2 by FDA under an Emergency Use Authorization (EUA). This EUA will remain  in effect (meaning this test can be used) for the duration of the COVID-19 declaration under Section 564(b)(1) of the Act, 21 U.S.C.section 360bbb-3(b)(1), unless the authorization is terminated  or revoked sooner.       Influenza A by PCR NEGATIVE NEGATIVE Final   Influenza B by PCR NEGATIVE NEGATIVE Final    Comment: (NOTE) The Xpert Xpress SARS-CoV-2/FLU/RSV plus assay is intended as an aid in the diagnosis of influenza from Nasopharyngeal swab specimens  and should not be used as a sole basis for treatment. Nasal washings and aspirates are unacceptable  for Xpert Xpress SARS-CoV-2/FLU/RSV testing.  Fact Sheet for Patients: EntrepreneurPulse.com.au  Fact Sheet for Healthcare Providers: IncredibleEmployment.be  This test is not yet approved or cleared by the [place] FDA and has been authorized for detection and/or diagnosis of SARS-CoV-2 by FDA under an Emergency Use Authorization (EUA). This EUA will remain in effect (meaning this test can be used) for the duration of the COVID-19 declaration under Section 564(b)(1) of the Act, 21 U.S.C. section 360bbb-3(b)(1), unless the authorization is terminated or revoked.  Performed at [facility], [place] [address], [place], [place] [address]   Blood Culture (routine x 2)     Status: None   Collection Time: 10/08/20  2:05 PM   Specimen: BLOOD  Result Value Ref Range Status   Specimen Description BLOOD LEFT ANTECUBITAL  Final   Special Requests   Final    BOTTLES DRAWN AEROBIC AND ANAEROBIC Blood Culture adequate volume   Culture   Final    NO GROWTH 5 DAYS Performed at [facility], [place] [address] [place], [place] [address]    Report Status 10/13/2020 FINAL  Final  Blood Culture (routine x 2)     Status: None   Collection Time: 10/08/20  2:14 PM   Specimen: BLOOD  Result Value Ref Range Status   Specimen Description BLOOD SITE NOT SPECIFIED  Final   Special Requests   Final    BOTTLES DRAWN AEROBIC AND ANAEROBIC Blood Culture results may not be optimal due to an inadequate volume of blood received in culture bottles   Culture   Final    NO GROWTH 5 DAYS Performed at [facility], [place] [address] [place], [place] [address]    Report Status 10/13/2020 FINAL  Final  C Difficile Quick Screen w PCR reflex     Status: None   Collection Time: 10/08/20  4:24 PM   Specimen: STOOL  Result Value Ref Range Status   C Diff antigen  NEGATIVE NEGATIVE Final   C Diff toxin NEGATIVE NEGATIVE Final   C Diff interpretation No C. difficile detected.  Final    Comment: Performed at [facility], [place] [address], [place], [place] [address]      Studies: No results found.    Bonnielee Haff, MD  [facility] 10/16/2020  If 7PM-7AM, please contact night-coverage

## 2020-10-17 LAB — TSH: TSH: 1.002 u[IU]/mL (ref 0.350–4.500)

## 2020-10-17 LAB — BASIC METABOLIC PANEL
Anion gap: 7 (ref 5–15)
BUN: 14 mg/dL (ref 8–23)
CO2: 27 mmol/L (ref 22–32)
Calcium: 8.2 mg/dL — ABNORMAL LOW (ref 8.9–10.3)
Chloride: 100 mmol/L (ref 98–111)
Creatinine, Ser: 0.64 mg/dL (ref 0.44–1.00)
GFR, Estimated: >60 mL/min (ref >60)
Glucose, Bld: 115 mg/dL — ABNORMAL HIGH (ref 70–99)
Potassium: 3.8 mmol/L (ref 3.5–5.1)
Sodium: 134 mmol/L — ABNORMAL LOW (ref 135–145)

## 2020-10-17 LAB — MAGNESIUM: Magnesium: 2.1 mg/dL (ref 1.7–2.4)

## 2020-10-17 LAB — T4, FREE: Free T4: 1.58 ng/dL — ABNORMAL HIGH (ref 0.61–1.12)

## 2020-10-17 LAB — VANCOMYCIN, PEAK: Vancomycin Pk: 33 ug/mL (ref 30–40)

## 2020-10-17 MED ORDER — POTASSIUM CHLORIDE CRYS ER 20 MEQ PO TBCR
40.0000 meq | EXTENDED_RELEASE_TABLET | Freq: Once | ORAL | Status: AC
  Administered 2020-10-17: 40 meq via ORAL
  Filled 2020-10-17: qty 2

## 2020-10-17 MED ORDER — FUROSEMIDE 10 MG/ML IJ SOLN
20.0000 mg | Freq: Once | INTRAMUSCULAR | Status: AC
  Administered 2020-10-17: 20 mg via INTRAVENOUS
  Filled 2020-10-17: qty 2

## 2020-10-17 NOTE — Progress Notes (Signed)
Pharmacy Antibiotic Note  [name] Price is a 80 y.o. female admitted on 10/08/2020 with cellulitis.  Pharmacy has been consulted for Vanco, Cefepime dosing.  CC/HPI:  pain and swelling in her right foot and lower leg, diarrhea from Keflex  ID: Rt foot swelling, cellulitis. Tentative plan for 7 days IV abx then switch to oral. MD plans to evaluate tomorrow and put in orders.   Afebrile, WBC 12.3, SCr 0.64 9/5 VP: 33  Plan: Continue cefepime 2g IV q12h Continue vancomycin '1250mg'$  IV Q q24h hrs. Goal AUC 400-550. Expected AUC: 512 SCr used: 0.8 Check 9/6 1100 VT and adjust dose accordingly F/u plan to switch to PO abx  Height: '5\' 3"'$  (160 cm) Weight: 70.5 kg (155 lb 6.8 oz) IBW/kg (Calculated) : 52.4  Temp (24hrs), Avg:98.4 F (36.9 C), Min:98.4 F (36.9 C), Max:98.4 F (36.9 C)  Recent Labs  Lab 10/11/20 0215 10/12/20 0033 10/13/20 0024 10/14/20 0424 10/15/20 0150 10/16/20 0039 10/16/20 0447 10/17/20 0123  WBC 8.2 8.5  --  13.3* 13.8*  --  12.3*  --   CREATININE 0.60 0.66 0.65 0.62 0.62 0.71  --  0.64     Estimated Creatinine Clearance: 55.4 mL/min (by C-G formula based on SCr of 0.64 mg/dL).    Allergies  Allergen Reactions   Codeine Nausea And Vomiting   Antibiotics and Cultures this Admission Cephalexin started 8/26 prior to arrival Ceftriaxone 8/27 x1 Doxy 8/27>8/30  Ancef 8/30-8/31 Vancomycin 8/27 x1, 8/31>> Cefepime 8/31>>  8/27: BC x 2: neg 8/27: Cdiff: negative  Donald Pore, PharmD Pharmacy Resident 10/17/2020, 12:10 PM

## 2020-10-17 NOTE — Progress Notes (Signed)
PT Cancellation Note  Patient Details Name: [name] Price MRN: [id number] DOB: 09-15-1940   Cancelled Treatment:    Reason Eval/Treat Not Completed: Patient declined, no reason specified (fatigue and not wanting IV to move)   Hera Celaya B Ryle Buscemi 10/17/2020, 1:02 PM Bayard Males, PT Acute Rehabilitation Services Pager: [phone] Office: [phone]

## 2020-10-17 NOTE — Progress Notes (Signed)
Occupational Therapy Treatment Patient Details Name: [name] Price MRN: [id number] DOB: 1940-04-11 Today's Date: 10/17/2020    History of present illness Pt is a 80 y/o female admitted 8/26 secondary to R foot cellulitis and early sepsis. PMH includes breast cancer and bilateral THA.   OT comments  Patient in bed asking when she might be going home.  Patient was supervision to get to eob and educated on use of sock aide with vcs. Patient transferred to [facility] with RW and supervision for transfer, clothing management, and hygiene. Patient ambulated to sink for grooming and stood with min guard to supervision. Patient making good gains with OT. Acute OT to continue to follow.   Follow Up Recommendations  Home health OT;Supervision/Assistance - 24 hour    Equipment Recommendations  3 in 1 bedside commode    Recommendations for Other Services      Precautions / Restrictions Precautions Precautions: Fall Precaution Comments: Rt foot cellulitis with edema and blisters Restrictions Weight Bearing Restrictions: No RLE Weight Bearing: Weight bearing as tolerated       Mobility Bed Mobility Overal bed mobility: Needs Assistance Bed Mobility: Supine to Sit;Sit to Supine     Supine to sit: Supervision;HOB elevated Sit to supine: Supervision;HOB elevated   General bed mobility comments: used bedrails to position self in bed    Transfers Overall transfer level: Needs assistance Equipment used: Rolling walker (2 wheeled) Transfers: Sit to/from Stand Sit to Stand: Supervision;Min guard Stand pivot transfers: Supervision       General transfer comment: Performed toilet and bed transfers with RW    Balance Overall balance assessment: Needs assistance Sitting-balance support: No upper extremity supported;Feet supported Sitting balance-Leahy Scale: Good Sitting balance - Comments: Able to perform LB dressing seated on eob   Standing balance support: No upper extremity  supported;During functional activity Standing balance-Leahy Scale: Fair Standing balance comment: Able to stand unassisted while standing at sink for grooming                           ADL either performed or assessed with clinical judgement   ADL Overall ADL's : Needs assistance/impaired     Grooming: Wash/dry hands;Wash/dry face;Oral care;Brushing hair;Supervision/safety;Standing Grooming Details (indicate cue type and reason): Performed standing at sink             Lower Body Dressing: Supervision/safety;Cueing for sequencing;With adaptive equipment;Sit to/from stand Lower Body Dressing Details (indicate cue type and reason): AE training with sock aide Toilet Transfer: Min guard;Stand-pivot;BSC;RW Toilet Transfer Details (indicate cue type and reason): To BSC next to bed using rw for transfer [place] and Hygiene: Supervision/safety;Sit to/from stand Toileting - Clothing Manipulation Details (indicate cue type and reason): Able to perform clothing management and hygiene standing using rw for support     Functional mobility during ADLs: Supervision/safety;Min guard;Rolling walker       Vision       Perception     Praxis      Cognition Arousal/Alertness: Awake/alert Behavior During Therapy: WFL for tasks assessed/performed Overall Cognitive Status: Within Functional Limits for tasks assessed                                 General Comments: Demonstrated good cognition, remembered therapist from last week        Exercises     Shoulder Instructions       General Comments  Pertinent Vitals/ Pain       Pain Assessment: No/denies pain  Home Living                                          Prior Functioning/Environment              Frequency  Min 2X/week        Progress Toward Goals  OT Goals(current goals can now be found in the care plan section)  Progress towards OT goals:  Progressing toward goals  Acute Rehab OT Goals Patient Stated Goal: to go home OT Goal Formulation: With patient Time For Goal Achievement: 10/25/20 Potential to Achieve Goals: Good ADL Goals Pt Will Perform Grooming: with modified independence;standing Pt Will Perform Upper Body Dressing: Independently Pt Will Perform Lower Body Dressing: with modified independence;sit to/from stand Pt Will Transfer to Toilet: with modified independence;ambulating Pt Will Perform Toileting - Clothing Manipulation and hygiene: with modified independence;sit to/from stand Pt Will Perform Tub/Shower Transfer: Tub transfer;Shower transfer;tub bench;rolling walker  Plan Discharge plan remains appropriate;Frequency remains appropriate    Co-evaluation                 AM-PAC OT "6 Clicks" Daily Activity     Outcome Measure   Help from another person eating meals?: None Help from another person taking care of personal grooming?: A Little Help from another person toileting, which includes using toliet, bedpan, or urinal?: A Little Help from another person bathing (including washing, rinsing, drying)?: A Little Help from another person to put on and taking off regular upper body clothing?: None Help from another person to put on and taking off regular lower body clothing?: A Little 6 Click Score: 20    End of Session Equipment Utilized During Treatment: Gait belt;Rolling walker  OT Visit Diagnosis: Unsteadiness on feet (R26.81);Muscle weakness (generalized) (M62.81);Pain Pain - Right/Left: Right Pain - part of body: Leg;Ankle and joints of foot   Activity Tolerance Patient tolerated treatment well   Patient Left in bed;with call bell/phone within reach;with bed alarm set   Nurse Communication Mobility status        Time: [id number] OT Time Calculation (min): 26 min  Charges: OT Treatments $Self Care/Home Management : 23-37 mins  Lodema Hong, OTA    Adriyana Greenbaum Alexis Goodell 10/17/2020, 8:57  AM

## 2020-10-17 NOTE — Progress Notes (Signed)
Notified by tele for a 15 beat run of Vtach. Tech was in with Pt at time of occurrence. Patient states "She has no chest pain and is feeling good." Dr. Tonie Griffith sent a secure mesg to notify MD of event.

## 2020-10-17 NOTE — Progress Notes (Signed)
PROGRESS NOTE  [name] Price [id number] DOB: 1941-01-23 DOA: 10/08/2020 PCP: Tonia Ghent, MD   LOS: 9 days    Brief narrative: [name] Price is a 80 y.o. female with past medical history of anxiety, depression presented to the hospital with right foot rash and blister with new onset diarrhea.  Patient recently had some itching around the right foot and had been prescribed for triamcinolone for 2 weeks but continued to have rash and itchiness which progressed.  He went to see her dermatologist who prescribed topical dexamethasone for a week but started having blisters and painful rash without any fever.  She then came into the ED and DVT study was negative and was given Keflex but started having multiple episodes of watery diarrhea and increasing blister and redness.  Patient then presented back to the ED where she was noted to be mildly tachycardic with a temperature of around 99 F.  Lactate was elevated at 4.3 and creatinine elevated at 1.4 compared to 0.8.  CT scan of the foot did not show any evidence of foreign body but diffuse soft tissue swelling.  Patient was transferred to hospital for further evaluation and treatment.  Assessment/Plan:  Right foot cellulitis with hemorrhagic blister Patient was initially placed on doxycycline.  She was found to have worsening erythema and tenderness along with swelling.  Was changed over to cefazolin.  Was seen by orthopedics and underwent drainage of the large blister. Due to concern for exposure to poison ivy she was also placed on steroids but due to worsening erythema steroids were discontinued. CT scan of the lower extremity showed only soft tissue swelling without any abscess. Due to worsening erythema on 8/31 patient's antibiotic spectrum was broadened.  She was placed on vancomycin and cefepime.   Patient's cellulitis has been slow to respond.  Continues to have a lot of oozing.  Erythema seemed to be better yesterday.  We will  reevaluate her leg and foot later today.  Her dressing was changed just a couple hours ago.  Remains afebrile.  Give her another dose of furosemide today.  BC had improved to 12.3 yesterday.  Procalcitonin improved to 0.33 from 0.53. Plan is to keep her on IV antibiotics for at least 7 days which will end on 9/6.  And then can be transitioned to oral antibiotics and hopefully discharge home.  Will likely need continued wound care at home.  Early sepsis likely secondary to foot cellulitis. Evidenced by tachycardia, and elevated lactate level, and early end organ damage of a new AKI.  Sepsis physiology resolved.  Cultures without any growth.    PSVT Occasional episodes of SVT noted on telemetry.  PVCs also noted.  Patient has been asymptomatic.  Was started on beta-blocker with improvement in SVT and PVCs.  Will not change dose of metoprolol.  TSH noted to be normal but free T4 was 1.58.  Will recommend this be rechecked in 3 to 4 weeks.  Echocardiogram showed normal systolic function without any other significant abnormalities.    GERD Continue PPI  Normocytic anemia Drop in hemoglobin is dilutional.  No evidence of overt bleeding.  Anemia panel shows ferritin of 421, TIBC 198, iron 54, folate 13.4, vitamin B12 2515. Hemoglobin has been stable for the most part.  We will recheck tomorrow.  Hypokalemia/hypomagnesemia/hypophosphatemia/hyponatremia Improved with replacement.    Acute kidney injury Likely secondary to prerenal azotemia from severe diarrhea.  Renal function is back to baseline.   Diarrhea  C. difficile was  negative.  On loperamide, continue probiotics while on antibiotic.  Appears to have resolved.  Weakness/debility.  Home health recommended at discharge  DVT prophylaxis: heparin injection 5,000 Units Start: 10/08/20 2200  Code Status: Full code  Family Communication:  Discussed with the patient.  Husband was updated yesterday.  Patient is keeping him informed.  Status  is: Inpatient  Remains inpatient appropriate because:IV treatments appropriate due to intensity of illness or inability to take PO, and Inpatient level of care appropriate due to severity of illness  Dispo: The patient is from: Home              Anticipated d/c is to: Home health as per PT recommendation.              Patient currently is not medically stable to d/c.   Difficult to place patient No  Consultants: orthopedics  Procedures: Incision and drainage of the blister on 10/09/2020  Anti-infectives:   Anti-infectives (From admission, onward)    Start     Dose/Rate Route Frequency Ordered Stop   10/12/20 1130  ceFEPIme (MAXIPIME) 2 g in sodium chloride 0.9 % 100 mL IVPB        2 g 200 mL/hr over 30 Minutes Intravenous Every 12 hours 10/12/20 1023     10/12/20 1130  vancomycin (VANCOREADY) IVPB 1250 mg/250 mL        1,250 mg 166.7 mL/hr over 90 Minutes Intravenous Every 24 hours 10/12/20 1030     10/12/20 1045  vancomycin (VANCOCIN) IVPB 1000 mg/200 mL premix  Status:  Discontinued        1,000 mg 200 mL/hr over 60 Minutes Intravenous  Once 10/12/20 0955 10/12/20 1015   10/12/20 1045  ceFEPIme (MAXIPIME) 2 g in sodium chloride 0.9 % 100 mL IVPB  Status:  Discontinued        2 g 200 mL/hr over 30 Minutes Intravenous  Once 10/12/20 0955 10/12/20 1015   10/11/20 1400  ceFAZolin (ANCEF) IVPB 1 g/50 mL premix  Status:  Discontinued        1 g 100 mL/hr over 30 Minutes Intravenous Every 8 hours 10/11/20 0902 10/12/20 0954   10/08/20 2200  doxycycline (VIBRA-TABS) tablet 100 mg  Status:  Discontinued        100 mg Oral Every 12 hours 10/08/20 1704 10/11/20 0902   10/08/20 1408  vancomycin variable dose per unstable renal function (pharmacist dosing)  Status:  Discontinued         Does not apply See admin instructions 10/08/20 1408 10/08/20 1703   10/08/20 1400  vancomycin (VANCOREADY) IVPB 1500 mg/300 mL        1,500 mg 150 mL/hr over 120 Minutes Intravenous  Once 10/08/20 1358  10/08/20 1826   10/08/20 1400  cefTRIAXone (ROCEPHIN) 2 g in sodium chloride 0.9 % 100 mL IVPB        2 g 200 mL/hr over 30 Minutes Intravenous  Once 10/08/20 1358 10/08/20 1514       Subjective: Overall patient feels well.  Has been able to bear weight on the left leg.  No significant pain issues.  No other symptoms such as shortness of breath or nausea or vomiting.     Objective: Vitals:   10/16/20 1115 10/17/20 0319  BP: 115/65 128/77  Pulse:  95  Resp:  16  Temp:  98.4 F (36.9 C)  SpO2: 99% 99%    Intake/Output Summary (Last 24 hours) at 10/17/2020 1023 Last data filed at 10/17/2020 [phone]  Gross per 24 hour  Intake 240 ml  Output 600 ml  Net -360 ml    Filed Weights   10/08/20 2043  Weight: 70.5 kg   Body mass index is 27.53 kg/m.    Physical Exam:  General appearance: Awake alert.  In no distress Resp: Clear to auscultation bilaterally.  Normal effort Cardio: S1-S2 is normal regular.  No S3-S4.  No rubs murmurs or bruit GI: Abdomen is soft.  Nontender nondistended.  Bowel sounds are present normal.  No masses organomegaly Extremities: We will reevaluate right lower extremity later today. Neurologic: Alert and oriented x3.  No focal neurological deficits.        On presentation:    Data Review: I have personally reviewed the following laboratory data and studies,  CBC: Recent Labs  Lab 10/11/20 0215 10/12/20 0033 10/14/20 0424 10/15/20 0150 10/16/20 0447  WBC 8.2 8.5 13.3* 13.8* 12.3*  NEUTROABS  --   --   --   --  9.4*  HGB 11.0* 10.7* 10.8* 10.6* 9.4*  HCT 31.6* 31.4* 32.1* 32.7* 29.2*  MCV 89.8 90.0 91.5 94.2 94.5  PLT 146* 179 256 288 [id number]    Basic Metabolic Panel: Recent Labs  Lab 10/11/20 0215 10/12/20 0033 10/13/20 0024 10/14/20 0424 10/15/20 0150 10/16/20 0039 10/16/20 1113 10/17/20 0123  NA 134* 137 136 136 136 133*  --  134*  K 3.4* 3.8 3.6 4.3 4.3 4.3  --  3.8  CL 102 100 98 95* 99 100  --  100  CO2 '26 30 30 '$ 32 30 30  --   27  GLUCOSE 149* 177* 149* 112* 111* 129*  --  115*  BUN 24* '20 20 10 10 11  '$ --  14  CREATININE 0.60 0.66 0.65 0.62 0.62 0.71  --  0.64  CALCIUM 9.4 9.1 8.7* 8.6* 8.8* 8.1*  --  8.2*  MG 1.7 1.8  --   --   --   --  2.2 2.1  PHOS 1.5* 3.1  --   --   --   --   --   --      CBG: No results for input(s): GLUCAP in the last 168 hours. Recent Results (from the past 240 hour(s))  Resp Panel by RT-PCR (Flu A&B, Covid) Nasopharyngeal Swab     Status: None   Collection Time: 10/08/20  1:57 PM   Specimen: Nasopharyngeal Swab; Nasopharyngeal(NP) swabs in vial transport medium  Result Value Ref Range Status   SARS Coronavirus 2 by RT PCR NEGATIVE NEGATIVE Final    Comment: (NOTE) SARS-CoV-2 target nucleic acids are NOT DETECTED.  The SARS-CoV-2 RNA is generally detectable in upper respiratory specimens during the acute phase of infection. The lowest concentration of SARS-CoV-2 viral copies this assay can detect is 138 copies/mL. A negative result does not preclude SARS-Cov-2 infection and should not be used as the sole basis for treatment or other patient management decisions. A negative result may occur with  improper specimen collection/handling, submission of specimen other than nasopharyngeal swab, presence of viral mutation(s) within the areas targeted by this assay, and inadequate number of viral copies(<138 copies/mL). A negative result must be combined with clinical observations, patient history, and epidemiological information. The expected result is Negative.  Fact Sheet for Patients:  EntrepreneurPulse.com.au  Fact Sheet for Healthcare Providers:  IncredibleEmployment.be  This test is no t yet approved or cleared by the [place] FDA and  has been authorized for detection and/or diagnosis of SARS-CoV-2 by FDA  under an Emergency Use Authorization (EUA). This EUA will remain  in effect (meaning this test can be used) for the duration of  the COVID-19 declaration under Section 564(b)(1) of the Act, 21 U.S.C.section 360bbb-3(b)(1), unless the authorization is terminated  or revoked sooner.       Influenza A by PCR NEGATIVE NEGATIVE Final   Influenza B by PCR NEGATIVE NEGATIVE Final    Comment: (NOTE) The Xpert Xpress SARS-CoV-2/FLU/RSV plus assay is intended as an aid in the diagnosis of influenza from Nasopharyngeal swab specimens and should not be used as a sole basis for treatment. Nasal washings and aspirates are unacceptable for Xpert Xpress SARS-CoV-2/FLU/RSV testing.  Fact Sheet for Patients: EntrepreneurPulse.com.au  Fact Sheet for Healthcare Providers: IncredibleEmployment.be  This test is not yet approved or cleared by the [place] FDA and has been authorized for detection and/or diagnosis of SARS-CoV-2 by FDA under an Emergency Use Authorization (EUA). This EUA will remain in effect (meaning this test can be used) for the duration of the COVID-19 declaration under Section 564(b)(1) of the Act, 21 U.S.C. section 360bbb-3(b)(1), unless the authorization is terminated or revoked.  Performed at [facility], [place] [address] [place], [place] [address]   Blood Culture (routine x 2)     Status: None   Collection Time: 10/08/20  2:05 PM   Specimen: BLOOD  Result Value Ref Range Status   Specimen Description BLOOD LEFT ANTECUBITAL  Final   Special Requests   Final    BOTTLES DRAWN AEROBIC AND ANAEROBIC Blood Culture adequate volume   Culture   Final    NO GROWTH 5 DAYS Performed at [facility], [place] [address] [place], [place] [address]    Report Status 10/13/2020 FINAL  Final  Blood Culture (routine x 2)     Status: None   Collection Time: 10/08/20  2:14 PM   Specimen: BLOOD  Result Value Ref Range Status   Specimen Description BLOOD SITE NOT SPECIFIED  Final   Special Requests   Final    BOTTLES DRAWN AEROBIC AND ANAEROBIC Blood Culture results  may not be optimal due to an inadequate volume of blood received in culture bottles   Culture   Final    NO GROWTH 5 DAYS Performed at [facility], [place] [address], [place], [place] [address]    Report Status 10/13/2020 FINAL  Final  C Difficile Quick Screen w PCR reflex     Status: None   Collection Time: 10/08/20  4:24 PM   Specimen: STOOL  Result Value Ref Range Status   C Diff antigen NEGATIVE NEGATIVE Final   C Diff toxin NEGATIVE NEGATIVE Final   C Diff interpretation No C. difficile detected.  Final    Comment: Performed at [facility], [place] [address], [place], [place] [address]      Studies: ECHOCARDIOGRAM COMPLETE  Result Date: 10/16/2020    ECHOCARDIOGRAM REPORT   Patient Name:   TEMECA RAHMAN Date of Exam: 10/16/2020 Medical Rec #:  [id number]         Height:       63.0 in Accession #:    [id number]        Weight:       155.4 lb Date of Birth:  [birth year]         BSA:          1.737 m Patient Age:    8 years  BP:           115/65 mmHg Patient Gender: F                 HR:           93 bpm. Exam Location:  Inpatient Procedure: 2D Echo, Cardiac Doppler and Color Doppler Indications:    Other abnormalities of the heart                 PSVT  History:        Patient has no prior history of Echocardiogram examinations.                 Arrythmias:PSVT; Risk Factors:Dyslipidemia.  Sonographer:    Wenda Low Referring Phys: [place]  1. Left ventricular ejection fraction, by estimation, is 60 to 65%. The left ventricle has normal function. The left ventricle has no regional wall motion abnormalities. Left ventricular diastolic parameters were normal.  2. Right ventricular systolic function is normal. The right ventricular size is normal. There is normal pulmonary artery systolic pressure.  3. The mitral valve is normal in structure. Mild mitral valve regurgitation. No evidence of mitral stenosis.  4. Tricuspid valve regurgitation is mild to  moderate.  5. The aortic valve is normal in structure. Aortic valve regurgitation is trivial. No aortic stenosis is present.  6. The inferior vena cava is normal in size with greater than 50% respiratory variability, suggesting right atrial pressure of 3 mmHg. FINDINGS  Left Ventricle: Left ventricular ejection fraction, by estimation, is 60 to 65%. The left ventricle has normal function. The left ventricle has no regional wall motion abnormalities. The left ventricular internal cavity size was normal in size. There is  no left ventricular hypertrophy. Left ventricular diastolic parameters were normal. Right Ventricle: The right ventricular size is normal. No increase in right ventricular wall thickness. Right ventricular systolic function is normal. There is normal pulmonary artery systolic pressure. The tricuspid regurgitant velocity is 2.76 m/s, and  with an assumed right atrial pressure of 3 mmHg, the estimated right ventricular systolic pressure is [id number] mmHg. Left Atrium: Left atrial size was normal in size. Right Atrium: Right atrial size was normal in size. Pericardium: There is no evidence of pericardial effusion. Mitral Valve: The mitral valve is normal in structure. Mild mitral valve regurgitation, with centrally-directed jet. No evidence of mitral valve stenosis. MV peak gradient, 4.8 mmHg. The mean mitral valve gradient is 2.0 mmHg. Tricuspid Valve: The tricuspid valve is normal in structure. Tricuspid valve regurgitation is mild to moderate. No evidence of tricuspid stenosis. Aortic Valve: The aortic valve is normal in structure. Aortic valve regurgitation is trivial. Aortic regurgitation PHT measures 438 msec. No aortic stenosis is present. Aortic valve mean gradient measures 5.0 mmHg. Aortic valve peak gradient measures 9.9  mmHg. Aortic valve area, by VTI measures 2.37 cm. Pulmonic Valve: The pulmonic valve was normal in structure. Pulmonic valve regurgitation is not visualized. No evidence of  pulmonic stenosis. Aorta: The aortic root is normal in size and structure. Venous: The inferior vena cava is normal in size with greater than 50% respiratory variability, suggesting right atrial pressure of 3 mmHg. IAS/Shunts: No atrial level shunt detected by color flow Doppler.  LEFT VENTRICLE PLAX 2D LVIDd:         4.80 cm     Diastology LVIDs:         3.00 cm     LV e' medial:    8.49 cm/s  LV PW:         0.90 cm     LV E/e' medial:  11.4 LV IVS:        1.00 cm     LV e' lateral:   9.68 cm/s LVOT diam:     2.00 cm     LV E/e' lateral: 10.0 LV SV:         74 LV SV Index:   43 LVOT Area:     3.14 cm  LV Volumes (MOD) LV vol d, MOD A2C: 70.3 ml LV vol d, MOD A4C: 69.7 ml LV vol s, MOD A2C: 29.6 ml LV vol s, MOD A4C: 26.5 ml LV SV MOD A2C:     40.7 ml LV SV MOD A4C:     69.7 ml LV SV MOD BP:      43.4 ml RIGHT VENTRICLE RV Basal diam:  3.00 cm RV Mid diam:    2.70 cm RV S prime:     15.60 cm/s TAPSE (M-mode): 2.4 cm LEFT ATRIUM             Index       RIGHT ATRIUM           Index LA diam:        3.70 cm 2.13 cm/m  RA Area:     11.90 cm LA Vol (A2C):   52.1 ml 29.99 ml/m RA Volume:   23.40 ml  13.47 ml/m LA Vol (A4C):   41.6 ml 23.95 ml/m LA Biplane Vol: 47.1 ml 27.11 ml/m  AORTIC VALVE AV Area (Vmax):    2.36 cm AV Area (Vmean):   2.28 cm AV Area (VTI):     2.37 cm AV Vmax:           157.00 cm/s AV Vmean:          106.000 cm/s AV VTI:            0.314 m AV Peak Grad:      9.9 mmHg AV Mean Grad:      5.0 mmHg LVOT Vmax:         118.00 cm/s LVOT Vmean:        76.900 cm/s LVOT VTI:          0.237 m LVOT/AV VTI ratio: 0.75 AI PHT:            438 msec  AORTA Ao Root diam: 3.20 cm MITRAL VALVE               TRICUSPID VALVE MV Area (PHT): 4.04 cm    TR Peak grad:   30.5 mmHg MV Area VTI:   2.36 cm    TR Vmax:        276.00 cm/s MV Peak grad:  4.8 mmHg MV Mean grad:  2.0 mmHg    SHUNTS MV Vmax:       1.10 m/s    Systemic VTI:  0.24 m MV Vmean:      67.6 cm/s   Systemic Diam: 2.00 cm MV Decel Time: 188 msec MV E  velocity: 96.80 cm/s MV A velocity: 96.00 cm/s MV E/A ratio:  1.01 Mihai Croitoru MD Electronically signed by Sanda Klein MD Signature Date/Time: 10/16/2020/4:27:00 PM    Final       Bonnielee Haff, MD  [facility] 10/17/2020  If 7PM-7AM, please contact night-coverage

## 2020-10-18 LAB — CBC
HCT: 28 % — ABNORMAL LOW (ref 36.0–46.0)
Hemoglobin: 9.1 g/dL — ABNORMAL LOW (ref 12.0–15.0)
MCH: 31 pg (ref 26.0–34.0)
MCHC: 32.5 g/dL (ref 30.0–36.0)
MCV: 95.2 fL (ref 80.0–100.0)
Platelets: 381 10*3/uL (ref 150–400)
RBC: 2.94 MIL/uL — ABNORMAL LOW (ref 3.87–5.11)
RDW: 14.4 % (ref 11.5–15.5)
WBC: 10.8 10*3/uL — ABNORMAL HIGH (ref 4.0–10.5)
nRBC: 0 % (ref 0.0–0.2)

## 2020-10-18 LAB — BASIC METABOLIC PANEL
Anion gap: 4 — ABNORMAL LOW (ref 5–15)
BUN: 15 mg/dL (ref 8–23)
CO2: 27 mmol/L (ref 22–32)
Calcium: 8 mg/dL — ABNORMAL LOW (ref 8.9–10.3)
Chloride: 104 mmol/L (ref 98–111)
Creatinine, Ser: 0.53 mg/dL (ref 0.44–1.00)
GFR, Estimated: >60 mL/min (ref >60)
Glucose, Bld: 110 mg/dL — ABNORMAL HIGH (ref 70–99)
Potassium: 4.3 mmol/L (ref 3.5–5.1)
Sodium: 135 mmol/L (ref 135–145)

## 2020-10-18 MED ORDER — DOXYCYCLINE HYCLATE 100 MG PO TABS
100.0000 mg | ORAL_TABLET | Freq: Two times a day (BID) | ORAL | Status: DC
  Administered 2020-10-19: 100 mg via ORAL
  Filled 2020-10-18: qty 1

## 2020-10-18 MED ORDER — METOPROLOL TARTRATE 12.5 MG HALF TABLET
12.5000 mg | ORAL_TABLET | Freq: Two times a day (BID) | ORAL | Status: DC
  Administered 2020-10-18 – 2020-10-19 (×2): 12.5 mg via ORAL
  Filled 2020-10-18 (×4): qty 1

## 2020-10-18 MED ORDER — AMOXICILLIN-POT CLAVULANATE 875-125 MG PO TABS
1.0000 | ORAL_TABLET | Freq: Two times a day (BID) | ORAL | Status: DC
  Administered 2020-10-19: 1 via ORAL
  Filled 2020-10-18: qty 1

## 2020-10-18 NOTE — Consult Note (Addendum)
[place] Nurse Consult Note: Patient receiving care in [facility] 2W03 Reason for Consult: Right foot wound Wound type: Cellulitis of the right foot. Blood blister has been lanced and bedside RNs are currently placing a Xeroform gauze over the wound and wrapping with Kerlix. These orders were placed on 10/10/20. We will continue this POC and will not follow at this time but are available to this patient and the medical team if needed.   Monitor the wound area(s) for worsening of condition such as: Signs/symptoms of infection, increase in size, development of or worsening of odor, development of pain, or increased pain at the affected locations.   Notify the medical team if any of these develop.  Thank you for the consult. [place] nurse will not follow at this time.   Please re-consult the [place] team if needed.  Cathlean Marseilles Tamala Julian, MSN, RN, [place], [place] Pearl, [facility] Wound Treatment Associate Pager [phone]

## 2020-10-18 NOTE — Progress Notes (Signed)
Physical Therapy Treatment Patient Details Name: [name] Price MRN: [id number] DOB: February 26, 1940 Today's Date: 10/18/2020    History of Present Illness Pt is a 80 y/o female admitted 8/26 secondary to R foot cellulitis and early sepsis. PMH includes breast cancer and bilateral THA.    PT Comments    Pt motivated to mobilize OOB, states "I want to go home tomorrow". Pt ambulatory for good hallway distance, overall requiring supervision for safety and cuing for form. Pt reports minimal RLE pain, states it is feeling much better. PT to continue to follow.      Follow Up Recommendations  Home health PT;Supervision/Assistance - 24 hour     Equipment Recommendations  3in1 (PT)    Recommendations for Other Services       Precautions / Restrictions Precautions Precautions: Fall Precaution Comments: Rt foot cellulitis with edema and blisters Restrictions Weight Bearing Restrictions: No RLE Weight Bearing: Weight bearing as tolerated    Mobility  Bed Mobility Overal bed mobility: Needs Assistance Bed Mobility: Supine to Sit;Sit to Supine     Supine to sit: Modified independent (Device/Increase time) Sit to supine: Modified independent (Device/Increase time)   General bed mobility comments: mod I for increased time and use of x1 bedrail    Transfers Overall transfer level: Needs assistance Equipment used: Rolling walker (2 wheeled) Transfers: Sit to/from Stand Sit to Stand: Supervision         General transfer comment: for safety, slow to rise and place hands correctly on RW.  Ambulation/Gait Ambulation/Gait assistance: Supervision Gait Distance (Feet): 190 Feet Assistive device: Rolling walker (2 wheeled) Gait Pattern/deviations: Step-through pattern;Decreased stride length;Antalgic Gait velocity: decr   General Gait Details: supervision for safety, verbal cuing for shoulder girdle relaxation away from ears, upright posture. Pt initially with antalgic gait due to  RLE discomfort, improving with distance.   Stairs             Wheelchair Mobility    Modified Rankin (Stroke Patients Only)       Balance Overall balance assessment: Needs assistance Sitting-balance support: No upper extremity supported;Feet supported Sitting balance-Leahy Scale: Good     Standing balance support: No upper extremity supported;During functional activity Standing balance-Leahy Scale: Fair Standing balance comment: requires RW dynamically                            Cognition Arousal/Alertness: Awake/alert Behavior During Therapy: WFL for tasks assessed/performed Overall Cognitive Status: Within Functional Limits for tasks assessed                                 General Comments: good safety awareness      Exercises      General Comments General comments (skin integrity, edema, etc.): drainage from RLE, sock donned for hallway ambulation      Pertinent Vitals/Pain Pain Assessment: Faces Faces Pain Scale: Hurts a little bit Pain Location: R foot Pain Descriptors / Indicators: Discomfort;Sore Pain Intervention(s): Limited activity within patient's tolerance;Monitored during session;Repositioned    Home Living                      Prior Function            PT Goals (current goals can now be found in the care plan section) Acute Rehab PT Goals Patient Stated Goal: to go home PT Goal Formulation: With patient Time  For Goal Achievement: 10/24/20 Potential to Achieve Goals: Good Progress towards PT goals: Progressing toward goals    Frequency    Min 3X/week      PT Plan Current plan remains appropriate    Co-evaluation              AM-PAC PT "6 Clicks" Mobility   Outcome Measure  Help needed turning from your back to your side while in a flat bed without using bedrails?: A Little Help needed moving from lying on your back to sitting on the side of a flat bed without using bedrails?: A  Little Help needed moving to and from a bed to a chair (including a wheelchair)?: A Little Help needed standing up from a chair using your arms (e.g., wheelchair or bedside chair)?: A Little Help needed to walk in hospital room?: A Little Help needed climbing 3-5 steps with a railing? : A Little 6 Click Score: 18    End of Session   Activity Tolerance: Patient tolerated treatment well Patient left: with call bell/phone within reach;in bed;with bed alarm set Nurse Communication: Mobility status PT Visit Diagnosis: Unsteadiness on feet (R26.81);Difficulty in walking, not elsewhere classified (R26.2);Pain Pain - Right/Left: Right Pain - part of body: Ankle and joints of foot     Time: [id number] PT Time Calculation (min) (ACUTE ONLY): 15 min  Charges:  $Gait Training: 8-22 mins                    Stacie Glaze, PT DPT Acute Rehabilitation Services Pager [phone]  Office [phone]    [place] 10/18/2020, 3:43 PM

## 2020-10-18 NOTE — Progress Notes (Addendum)
PROGRESS NOTE  [name] Price [id number] DOB: May 08, 1940 DOA: 10/08/2020 PCP: [name] Ghent, MD   LOS: 10 days    Brief narrative: [name] Price is a 80 y.o. female with past medical history of anxiety, depression presented to the hospital with right foot rash and blister with new onset diarrhea.  Patient recently had some itching around the right foot and had been prescribed for triamcinolone for 2 weeks but continued to have rash and itchiness which progressed.  He went to see her dermatologist who prescribed topical dexamethasone for a week but started having blisters and painful rash without any fever.  She then came into the ED and DVT study was negative and was given Keflex but started having multiple episodes of watery diarrhea and increasing blister and redness.  Patient then presented back to the ED where she was noted to be mildly tachycardic with a temperature of around 99 F.  Lactate was elevated at 4.3 and creatinine elevated at 1.4 compared to 0.8.  CT scan of the foot did not show any evidence of foreign body but diffuse soft tissue swelling.  Patient was transferred to hospital for further evaluation and treatment.  Patient was initially placed on cefazolin.  Due to worsening erythema transition to vancomycin and cefepime.  Seen by orthopedics and underwent debridement of the blister.  Assessment/Plan:  Right foot cellulitis with hemorrhagic blister Patient was initially placed on doxycycline.  She was found to have worsening erythema and tenderness along with swelling.  Was changed over to cefazolin.  Was seen by orthopedics and underwent drainage of the large blister. Due to concern for exposure to poison ivy she was also placed on steroids but due to worsening erythema steroids were discontinued. CT scan of the lower extremity showed only soft tissue swelling without any abscess. Due to worsening erythema on 8/31 patient's antibiotic spectrum was broadened.  She  was placed on vancomycin and cefepime.   Due to swelling patient was also given furosemide. WBC had improved.  Procalcitonin also had improved. Continues to have a lot of wheezing but overall the leg appears to be improving.  Less erythema is noted.  Pain is better.  There is a small blister in the superior aspect of the lower leg but no evidence for pulses noted.  With continue to monitor for now. Today if she will complete 7 days of vancomycin and cefepime.  Will transition to oral antibiotics tomorrow in the form of Augmentin and doxycycline.  Anticipate that she may be able to go home in the next 24 to 48 hours.  Will need dressing changes at home which hopefully can be done by her family.  May need home health as well.  Early sepsis likely secondary to foot cellulitis. Evidenced by tachycardia, and elevated lactate level, and early end organ damage of a new AKI.  Sepsis physiology resolved.  Cultures without any growth.    PSVT Occasional episodes of SVT noted on telemetry.  PVCs also noted.  Patient has been asymptomatic.  Was started on beta-blocker with improvement in SVT and PVCs.  TSH noted to be normal but free T4 was 1.58.  Will recommend this be rechecked in 3 to 4 weeks.   Echocardiogram showed normal systolic function without any other significant abnormalities.   Dose of beta-blocker was slightly increased.  Blood pressure noted to be on the lower side.  Continue to monitor.  She is not on any other antihypertensives.  GERD Continue PPI  Normocytic anemia  Drop in hemoglobin is dilutional.  No evidence of overt bleeding.  Anemia panel shows ferritin of 421, TIBC 198, iron 54, folate 13.4, vitamin B12 2515. Hemoglobin is stable.  Hypokalemia/hypomagnesemia/hypophosphatemia/hyponatremia Improved with replacement.    Acute kidney injury Likely secondary to prerenal azotemia from severe diarrhea.  Renal function is back to baseline.   Diarrhea  C. difficile was negative.  On  loperamide, continue probiotics while on antibiotic.  Appears to have resolved.  Weakness/debility.  Home health recommended at discharge  DVT prophylaxis: heparin injection 5,000 Units Start: 10/08/20 2200  Code Status: Full code  Family Communication:  Discussed with the patient.  Patient is keeping her husband informed.  Status is: Inpatient  Remains inpatient appropriate because:IV treatments appropriate due to intensity of illness or inability to take PO, and Inpatient level of care appropriate due to severity of illness  Dispo: The patient is from: Home              Anticipated d/c is to: Home health as per PT recommendation.              Patient currently is not medically stable to d/c.   Difficult to place patient No  Consultants: orthopedics  Procedures: Incision and drainage of the blister on 10/09/2020  Anti-infectives:   Anti-infectives (From admission, onward)    Start     Dose/Rate Route Frequency Ordered Stop   10/12/20 1130  ceFEPIme (MAXIPIME) 2 g in sodium chloride 0.9 % 100 mL IVPB        2 g 200 mL/hr over 30 Minutes Intravenous Every 12 hours 10/12/20 1023     10/12/20 1130  vancomycin (VANCOREADY) IVPB 1250 mg/250 mL        1,250 mg 166.7 mL/hr over 90 Minutes Intravenous Every 24 hours 10/12/20 1030     10/12/20 1045  vancomycin (VANCOCIN) IVPB 1000 mg/200 mL premix  Status:  Discontinued        1,000 mg 200 mL/hr over 60 Minutes Intravenous  Once 10/12/20 0955 10/12/20 1015   10/12/20 1045  ceFEPIme (MAXIPIME) 2 g in sodium chloride 0.9 % 100 mL IVPB  Status:  Discontinued        2 g 200 mL/hr over 30 Minutes Intravenous  Once 10/12/20 0955 10/12/20 1015   10/11/20 1400  ceFAZolin (ANCEF) IVPB 1 g/50 mL premix  Status:  Discontinued        1 g 100 mL/hr over 30 Minutes Intravenous Every 8 hours 10/11/20 0902 10/12/20 0954   10/08/20 2200  doxycycline (VIBRA-TABS) tablet 100 mg  Status:  Discontinued        100 mg Oral Every 12 hours 10/08/20 1704  10/11/20 0902   10/08/20 1408  vancomycin variable dose per unstable renal function (pharmacist dosing)  Status:  Discontinued         Does not apply See admin instructions 10/08/20 1408 10/08/20 1703   10/08/20 1400  vancomycin (VANCOREADY) IVPB 1500 mg/300 mL        1,500 mg 150 mL/hr over 120 Minutes Intravenous  Once 10/08/20 1358 10/08/20 1826   10/08/20 1400  cefTRIAXone (ROCEPHIN) 2 g in sodium chloride 0.9 % 100 mL IVPB        2 g 200 mL/hr over 30 Minutes Intravenous  Once 10/08/20 1358 10/08/20 1514       Subjective: Patient feels better.  She feels the leg is improving.  Looking forward to going home soon.  Denies any other complaints.  No chest pain  shortness of breath dizziness.  No palpitations.  Objective: Vitals:   10/18/20 0500 10/18/20 0932  BP: 115/67 (!) 94/39  Pulse: 91   Resp: 14   Temp: 98.3 F (36.8 C)   SpO2: 97%     Intake/Output Summary (Last 24 hours) at 10/18/2020 1056 Last data filed at 10/17/2020 2200 Gross per 24 hour  Intake 980 ml  Output --  Net 980 ml    Filed Weights   10/08/20 2043  Weight: 70.5 kg   Body mass index is 27.53 kg/m.    Physical Exam:  General appearance: Awake alert.  In no distress Resp: Clear to auscultation bilaterally.  Normal effort Cardio: S1-S2 is normal regular.  No S3-S4.  No rubs murmurs or bruit GI: Abdomen is soft.  Nontender nondistended.  Bowel sounds are present normal.  No masses organomegaly Extremities: Continues to sloughing of skin however overall the leg looks better compared to how it was a few days ago.  Erythema is less.  Does extend to the medial aspect of the foot.  Oozing is noted. Neurologic: Alert and oriented x3.  No focal neurological deficits.        On presentation:    Data Review: I have personally reviewed the following laboratory data and studies,  CBC: Recent Labs  Lab 10/12/20 0033 10/14/20 0424 10/15/20 0150 10/16/20 0447 10/18/20 0029  WBC 8.5 13.3* 13.8*  12.3* 10.8*  NEUTROABS  --   --   --  9.4*  --   HGB 10.7* 10.8* 10.6* 9.4* 9.1*  HCT 31.4* 32.1* 32.7* 29.2* 28.0*  MCV 90.0 91.5 94.2 94.5 95.2  PLT 179 256 288 303 [id number]    Basic Metabolic Panel: Recent Labs  Lab 10/12/20 0033 10/13/20 0024 10/14/20 0424 10/15/20 0150 10/16/20 0039 10/16/20 1113 10/17/20 0123 10/18/20 0029  NA 137   < > 136 136 133*  --  134* 135  K 3.8   < > 4.3 4.3 4.3  --  3.8 4.3  CL 100   < > 95* 99 100  --  100 104  CO2 30   < > 32 30 30  --  27 27  GLUCOSE 177*   < > 112* 111* 129*  --  115* 110*  BUN 20   < > '10 10 11  '$ --  14 15  CREATININE 0.66   < > 0.62 0.62 0.71  --  0.64 0.53  CALCIUM 9.1   < > 8.6* 8.8* 8.1*  --  8.2* 8.0*  MG 1.8  --   --   --   --  2.2 2.1  --   PHOS 3.1  --   --   --   --   --   --   --    < > = values in this interval not displayed.     CBG: No results for input(s): GLUCAP in the last 168 hours. Recent Results (from the past 240 hour(s))  Resp Panel by RT-PCR (Flu A&B, Covid) Nasopharyngeal Swab     Status: None   Collection Time: 10/08/20  1:57 PM   Specimen: Nasopharyngeal Swab; Nasopharyngeal(NP) swabs in vial transport medium  Result Value Ref Range Status   SARS Coronavirus 2 by RT PCR NEGATIVE NEGATIVE Final    Comment: (NOTE) SARS-CoV-2 target nucleic acids are NOT DETECTED.  The SARS-CoV-2 RNA is generally detectable in upper respiratory specimens during the acute phase of infection. The lowest concentration of SARS-CoV-2 viral copies this assay can  detect is 138 copies/mL. A negative result does not preclude SARS-Cov-2 infection and should not be used as the sole basis for treatment or other patient management decisions. A negative result may occur with  improper specimen collection/handling, submission of specimen other than nasopharyngeal swab, presence of viral mutation(s) within the areas targeted by this assay, and inadequate number of viral copies(<138 copies/mL). A negative result must be  combined with clinical observations, patient history, and epidemiological information. The expected result is Negative.  Fact Sheet for Patients:  EntrepreneurPulse.com.au  Fact Sheet for Healthcare Providers:  IncredibleEmployment.be  This test is no t yet approved or cleared by the [place] FDA and  has been authorized for detection and/or diagnosis of SARS-CoV-2 by FDA under an Emergency Use Authorization (EUA). This EUA will remain  in effect (meaning this test can be used) for the duration of the COVID-19 declaration under Section 564(b)(1) of the Act, 21 U.S.C.section 360bbb-3(b)(1), unless the authorization is terminated  or revoked sooner.       Influenza A by PCR NEGATIVE NEGATIVE Final   Influenza B by PCR NEGATIVE NEGATIVE Final    Comment: (NOTE) The Xpert Xpress SARS-CoV-2/FLU/RSV plus assay is intended as an aid in the diagnosis of influenza from Nasopharyngeal swab specimens and should not be used as a sole basis for treatment. Nasal washings and aspirates are unacceptable for Xpert Xpress SARS-CoV-2/FLU/RSV testing.  Fact Sheet for Patients: EntrepreneurPulse.com.au  Fact Sheet for Healthcare Providers: IncredibleEmployment.be  This test is not yet approved or cleared by the [place] FDA and has been authorized for detection and/or diagnosis of SARS-CoV-2 by FDA under an Emergency Use Authorization (EUA). This EUA will remain in effect (meaning this test can be used) for the duration of the COVID-19 declaration under Section 564(b)(1) of the Act, 21 U.S.C. section 360bbb-3(b)(1), unless the authorization is terminated or revoked.  Performed at [facility], [place] [address] [place], [place] [address]   Blood Culture (routine x 2)     Status: None   Collection Time: 10/08/20  2:05 PM   Specimen: BLOOD  Result Value Ref Range Status   Specimen Description BLOOD LEFT  ANTECUBITAL  Final   Special Requests   Final    BOTTLES DRAWN AEROBIC AND ANAEROBIC Blood Culture adequate volume   Culture   Final    NO GROWTH 5 DAYS Performed at [facility], [place] [address] [place], [place] [address]    Report Status 10/13/2020 FINAL  Final  Blood Culture (routine x 2)     Status: None   Collection Time: 10/08/20  2:14 PM   Specimen: BLOOD  Result Value Ref Range Status   Specimen Description BLOOD SITE NOT SPECIFIED  Final   Special Requests   Final    BOTTLES DRAWN AEROBIC AND ANAEROBIC Blood Culture results may not be optimal due to an inadequate volume of blood received in culture bottles   Culture   Final    NO GROWTH 5 DAYS Performed at [facility], [place] [address], [place], [place] [address]    Report Status 10/13/2020 FINAL  Final  C Difficile Quick Screen w PCR reflex     Status: None   Collection Time: 10/08/20  4:24 PM   Specimen: STOOL  Result Value Ref Range Status   C Diff antigen NEGATIVE NEGATIVE Final   C Diff toxin NEGATIVE NEGATIVE Final   C Diff interpretation No C. difficile detected.  Final    Comment:  Performed at [facility], [place] [address], [place], [place] [address]      Studies: ECHOCARDIOGRAM COMPLETE  Result Date: 10/16/2020    ECHOCARDIOGRAM REPORT   Patient Name:   DANYLA SIELING Date of Exam: 10/16/2020 Medical Rec #:  [id number]         Height:       63.0 in Accession #:    [id number]        Weight:       155.4 lb Date of Birth:  09-07-1940         BSA:          1.737 m Patient Age:    23 years          BP:           115/65 mmHg Patient Gender: F                 HR:           93 bpm. Exam Location:  Inpatient Procedure: 2D Echo, Cardiac Doppler and Color Doppler Indications:    Other abnormalities of the heart                 PSVT  History:        Patient has no prior history of Echocardiogram examinations.                 Arrythmias:PSVT; Risk Factors:Dyslipidemia.  Sonographer:    Wenda Low Referring  Phys: [place]  1. Left ventricular ejection fraction, by estimation, is 60 to 65%. The left ventricle has normal function. The left ventricle has no regional wall motion abnormalities. Left ventricular diastolic parameters were normal.  2. Right ventricular systolic function is normal. The right ventricular size is normal. There is normal pulmonary artery systolic pressure.  3. The mitral valve is normal in structure. Mild mitral valve regurgitation. No evidence of mitral stenosis.  4. Tricuspid valve regurgitation is mild to moderate.  5. The aortic valve is normal in structure. Aortic valve regurgitation is trivial. No aortic stenosis is present.  6. The inferior vena cava is normal in size with greater than 50% respiratory variability, suggesting right atrial pressure of 3 mmHg. FINDINGS  Left Ventricle: Left ventricular ejection fraction, by estimation, is 60 to 65%. The left ventricle has normal function. The left ventricle has no regional wall motion abnormalities. The left ventricular internal cavity size was normal in size. There is  no left ventricular hypertrophy. Left ventricular diastolic parameters were normal. Right Ventricle: The right ventricular size is normal. No increase in right ventricular wall thickness. Right ventricular systolic function is normal. There is normal pulmonary artery systolic pressure. The tricuspid regurgitant velocity is 2.76 m/s, and  with an assumed right atrial pressure of 3 mmHg, the estimated right ventricular systolic pressure is [id number] mmHg. Left Atrium: Left atrial size was normal in size. Right Atrium: Right atrial size was normal in size. Pericardium: There is no evidence of pericardial effusion. Mitral Valve: The mitral valve is normal in structure. Mild mitral valve regurgitation, with centrally-directed jet. No evidence of mitral valve stenosis. MV peak gradient, 4.8 mmHg. The mean mitral valve gradient is 2.0 mmHg. Tricuspid Valve: The  tricuspid valve is normal in structure. Tricuspid valve regurgitation is mild to moderate. No evidence of tricuspid stenosis. Aortic Valve: The aortic valve is normal in structure. Aortic valve regurgitation is trivial. Aortic regurgitation PHT measures 438 msec. No aortic stenosis is present. Aortic  valve mean gradient measures 5.0 mmHg. Aortic valve peak gradient measures 9.9  mmHg. Aortic valve area, by VTI measures 2.37 cm. Pulmonic Valve: The pulmonic valve was normal in structure. Pulmonic valve regurgitation is not visualized. No evidence of pulmonic stenosis. Aorta: The aortic root is normal in size and structure. Venous: The inferior vena cava is normal in size with greater than 50% respiratory variability, suggesting right atrial pressure of 3 mmHg. IAS/Shunts: No atrial level shunt detected by color flow Doppler.  LEFT VENTRICLE PLAX 2D LVIDd:         4.80 cm     Diastology LVIDs:         3.00 cm     LV e' medial:    8.49 cm/s LV PW:         0.90 cm     LV E/e' medial:  11.4 LV IVS:        1.00 cm     LV e' lateral:   9.68 cm/s LVOT diam:     2.00 cm     LV E/e' lateral: 10.0 LV SV:         74 LV SV Index:   43 LVOT Area:     3.14 cm  LV Volumes (MOD) LV vol d, MOD A2C: 70.3 ml LV vol d, MOD A4C: 69.7 ml LV vol s, MOD A2C: 29.6 ml LV vol s, MOD A4C: 26.5 ml LV SV MOD A2C:     40.7 ml LV SV MOD A4C:     69.7 ml LV SV MOD BP:      43.4 ml RIGHT VENTRICLE RV Basal diam:  3.00 cm RV Mid diam:    2.70 cm RV S prime:     15.60 cm/s TAPSE (M-mode): 2.4 cm LEFT ATRIUM             Index       RIGHT ATRIUM           Index LA diam:        3.70 cm 2.13 cm/m  RA Area:     11.90 cm LA Vol (A2C):   52.1 ml 29.99 ml/m RA Volume:   23.40 ml  13.47 ml/m LA Vol (A4C):   41.6 ml 23.95 ml/m LA Biplane Vol: 47.1 ml 27.11 ml/m  AORTIC VALVE AV Area (Vmax):    2.36 cm AV Area (Vmean):   2.28 cm AV Area (VTI):     2.37 cm AV Vmax:           157.00 cm/s AV Vmean:          106.000 cm/s AV VTI:            0.314 m AV Peak  Grad:      9.9 mmHg AV Mean Grad:      5.0 mmHg LVOT Vmax:         118.00 cm/s LVOT Vmean:        76.900 cm/s LVOT VTI:          0.237 m LVOT/AV VTI ratio: 0.75 AI PHT:            438 msec  AORTA Ao Root diam: 3.20 cm MITRAL VALVE               TRICUSPID VALVE MV Area (PHT): 4.04 cm    TR Peak grad:   30.5 mmHg MV Area VTI:   2.36 cm    TR Vmax:        276.00 cm/s  MV Peak grad:  4.8 mmHg MV Mean grad:  2.0 mmHg    SHUNTS MV Vmax:       1.10 m/s    Systemic VTI:  0.24 m MV Vmean:      67.6 cm/s   Systemic Diam: 2.00 cm MV Decel Time: 188 msec MV E velocity: 96.80 cm/s MV A velocity: 96.00 cm/s MV E/A ratio:  1.01 Mihai Croitoru MD Electronically signed by Sanda Klein MD Signature Date/Time: 10/16/2020/4:27:00 PM    Final       Bonnielee Haff, MD  [facility] 10/18/2020  If 7PM-7AM, please contact night-coverage

## 2020-10-18 NOTE — TOC Progression Note (Signed)
Transition of Care [facility]) - Progression Note    Patient Details  Name: [name] Price MRN: [id number] Date of Birth: 05-Feb-1941  Transition of [facility]) CM/SW Contact  Joanne Chars, LCSW Phone Number: 10/18/2020, 3:56 PM  Clinical Narrative:   TOC continues to follow for discharge needs.      Expected Discharge Plan: [place] Barriers to Discharge: Continued Medical Work up  Expected Discharge Plan and Services Expected Discharge Plan: [place] Choice: [place] arrangements for the past 2 months: Single Family Home                           HH Arranged: PT, OT HH Agency: [place] Date [place]: 10/12/20 Time HH Agency Contacted: 1200 Representative spoke with at [place]: [place] ([place]) Interventions    Readmission Risk Interventions No flowsheet data found.

## 2020-10-19 LAB — BASIC METABOLIC PANEL
Anion gap: 4 — ABNORMAL LOW (ref 5–15)
BUN: 12 mg/dL (ref 8–23)
CO2: 27 mmol/L (ref 22–32)
Calcium: 8.2 mg/dL — ABNORMAL LOW (ref 8.9–10.3)
Chloride: 106 mmol/L (ref 98–111)
Creatinine, Ser: 0.53 mg/dL (ref 0.44–1.00)
GFR, Estimated: >60 mL/min (ref >60)
Glucose, Bld: 108 mg/dL — ABNORMAL HIGH (ref 70–99)
Potassium: 4.2 mmol/L (ref 3.5–5.1)
Sodium: 137 mmol/L (ref 135–145)

## 2020-10-19 MED ORDER — AMOXICILLIN-POT CLAVULANATE 875-125 MG PO TABS: 1.0000 | ORAL_TABLET | Freq: Two times a day (BID) | ORAL | 0 refills | Status: DC

## 2020-10-19 MED ORDER — RISAQUAD PO CAPS: 1.0000 | ORAL_CAPSULE | Freq: Every day | ORAL | 0 refills | Status: DC

## 2020-10-19 MED ORDER — DOXYCYCLINE HYCLATE 100 MG PO TABS: 100.0000 mg | ORAL_TABLET | Freq: Two times a day (BID) | ORAL | 0 refills | Status: DC

## 2020-10-19 MED ORDER — METOPROLOL TARTRATE 25 MG PO TABS: 12.5000 mg | ORAL_TABLET | Freq: Two times a day (BID) | ORAL | 1 refills | Status: DC

## 2020-10-19 MED ORDER — ACETAMINOPHEN 325 MG PO TABS: 650.0000 mg | ORAL_TABLET | Freq: Four times a day (QID) | ORAL | Status: DC | PRN

## 2020-10-19 NOTE — Discharge Summary (Signed)
DISCHARGE SUMMARY  [name] Price  MR#: [id number]  DOB:1940-09-02  Date of Admission: 10/08/2020 Date of Discharge: 10/19/2020  Attending Physician:Bayleigh Loflin Hennie Duos, MD  Patient's IX:[id number], [name] Rising, MD  Consults: Orthopedic Surgery   Disposition: D/C  home   Follow-up Appts:  Follow-up Information     Tonia Ghent, MD Follow up.   Specialty: Family Medicine Contact information: [place] [address] [phone]                 Tests Needing Follow-up: -assess wound of R foot  -recheck TSH and FT4 in 4-6 weeks   Discharge Diagnoses: Right foot cellulitis with hemorrhagic blister Early sepsis likely secondary to foot cellulitis. PSVT GERD Normocytic anemia Hypokalemia/hypomagnesemia/hypophosphatemia/hyponatremia Acute kidney injury Diarrhea  Weakness/debility  Initial presentation: 80 y.o. female with past medical history of anxiety, depression presented to the hospital with right foot rash and blister with new onset diarrhea.  Patient recently had some itching around the right foot and had been prescribed triamcinolone for 2 weeks but continued to have rash and itchiness which progressed.  She went to see her Dermatologist who prescribed topical dexamethasone for a week but started having blisters and painful rash without fever.  She then came into the ED and DVT study was negative and was given Keflex but started having multiple episodes of watery diarrhea and increasing blister and redness.  Patient then presented back to the ED where she was noted to be mildly tachycardic with a temperature of around 99 F.  Lactate was elevated at 4.3 and creatinine elevated at 1.4 compared to 0.8.  CT scan of the foot did not show any evidence of foreign body but diffuse soft tissue swelling.  Patient was transferred to hospital for further evaluation and treatment.   Hospital Course: Patient was initially placed on cefazolin.  Due to  worsening erythema transition to vancomycin and cefepime.  Seen by orthopedics and underwent debridement of the blister.  Right foot cellulitis with hemorrhagic blister Patient was initially placed on doxycycline.  She was found to have worsening erythema and tenderness along with swelling.  Was changed over to cefazolin.  Was seen by orthopedics and underwent drainage of the large blister. Due to concern for exposure to poison ivy she was also placed on steroids but due to worsening erythema steroids were discontinued. CT scan of the lower extremity showed only soft tissue swelling without any abscess. Due to worsening erythema on 8/31 patient's antibiotic spectrum was broadened.  She was placed on vancomycin and cefepime.   Due to swelling patient was also given furosemide. WBC had improved.  Procalcitonin also had improved. Continues to have a lot of wheeping but overall the leg appears to be improving.  Less erythema is noted.  Pain is better.  There is a small blister in the superior aspect of the lower leg.  She completed 7 days of vancomycin and cefepime and was transitioned to oral antibiotics (Augmentin and doxycycline).  Will need dressing changes at home. HH arranged by TOC.    Early sepsis likely secondary to foot cellulitis. Evidenced by tachycardia, and elevated lactate level, and early end organ damage of new AKI.  Sepsis physiology resolved.  Cultures without any growth.     PSVT Occasional episodes of SVT noted on telemetry.  PVCs also noted.  Patient has been asymptomatic.  Was started on beta-blocker with improvement in SVT and PVCs.  TSH noted to be normal but free T4 was 1.58.  Will recommend this be rechecked in 3 to 4 weeks.   Echocardiogram showed normal systolic function without any other significant abnormalities.   Dose of beta-blocker was slightly increased.  Blood pressure noted to be on the lower side.  Continue to monitor.  She is not on any other antihypertensives.    GERD Continue PPI  Normocytic anemia Drop in hemoglobin was dilutional.  No evidence of overt bleeding.  Anemia panel shows ferritin of 421, TIBC 198, iron 54, folate 13.4, vitamin B12 2515. Hemoglobin was stable.   Hypokalemia/hypomagnesemia/hypophosphatemia/hyponatremia Improved with replacement.     Acute kidney injury Likely secondary to prerenal azotemia from severe diarrhea.  Renal function back to baseline.   Diarrhea  C. difficile was negative.  On loperamide, continue probiotics while on antibiotic.  Appears to have resolved.   [facility] Home health recommended at discharge   Allergies as of 10/19/2020       Reactions   Codeine Nausea And Vomiting        Medication List     STOP taking these medications    triamcinolone ointment 0.5 % Commonly known as: KENALOG       TAKE these medications    acetaminophen 325 MG tablet Commonly known as: TYLENOL Take 2 tablets (650 mg total) by mouth every 6 (six) hours as needed for mild pain (or Fever >/= 101).   acidophilus Caps capsule Take 1 capsule by mouth daily. Start taking on: October 20, 2020   amoxicillin-clavulanate 875-125 MG tablet Commonly known as: AUGMENTIN Take 1 tablet by mouth every 12 (twelve) hours for 10 days.   calcium carbonate 600 MG Tabs tablet Commonly known as: OS-CAL Take 600 mg by mouth 2 (two) times daily with a meal.   Cyanocobalamin 1000 MCG Tbcr Take 1,000 mcg by mouth daily.   doxycycline 100 MG tablet Commonly known as: VIBRA-TABS Take 1 tablet (100 mg total) by mouth every 12 (twelve) hours for 10 days.   hydrOXYzine 25 MG tablet Commonly known as: ATARAX/VISTARIL Take 1 tablet (25 mg total) by mouth 3 (three) times daily as needed for itching.   metoprolol tartrate 25 MG tablet Commonly known as: LOPRESSOR Take 0.5 tablets (12.5 mg total) by mouth 2 (two) times daily.   PARoxetine 25 MG 24 hr tablet Commonly known as: PAXIL-CR Take 25 mg by mouth  daily.   Vitamin D 50 MCG (2000 UT) tablet Take 2,000 Units by mouth daily.        Day of Discharge BP 113/69   Pulse 82   Temp 99 F (37.2 C) (Oral)   Resp 16   Ht '5\' 3"'$  (1.6 m)   Wt 70.5 kg   SpO2 97%   BMI 27.53 kg/m   Physical Exam: General: No acute respiratory distress Lungs: Clear to auscultation bilaterally without wheezes or crackles Cardiovascular: Regular rate and rhythm without murmur gallop or rub normal S1 and S2 Abdomen: Nontender, nondistended, soft, bowel sounds positive, no rebound, no ascites, no appreciable mass Extremities: Continued sloughing of skin RLE - overall the leg looks better compared to pictures of prior exam.  Serosanguinous oozing noted.   Basic Metabolic Panel: Recent Labs  Lab 10/15/20 0150 10/16/20 0039 10/16/20 1113 10/17/20 0123 10/18/20 0029 10/19/20 0127  NA 136 133*  --  134* 135 137  K 4.3 4.3  --  3.8 4.3 4.2  CL 99 100  --  100 104 106  CO2 30 30  --  '27 27 27  '$ GLUCOSE 111* 129*  --  115* 110* 108*  BUN 10 11  --  '14 15 12  '$ CREATININE 0.62 0.71  --  0.64 0.53 0.53  CALCIUM 8.8* 8.1*  --  8.2* 8.0* 8.2*  MG  --   --  2.2 2.1  --   --     CBC: Recent Labs  Lab 10/14/20 0424 10/15/20 0150 10/16/20 0447 10/18/20 0029  WBC 13.3* 13.8* 12.3* 10.8*  NEUTROABS  --   --  9.4*  --   HGB 10.8* 10.6* 9.4* 9.1*  HCT 32.1* 32.7* 29.2* 28.0*  MCV 91.5 94.2 94.5 95.2  PLT 256 288 303 381    Time spent in discharge (includes decision making & examination of pt): 35 minutes  10/19/2020, 12:49 PM   Cherene Altes, MD [facility] Office  [phone]

## 2020-10-19 NOTE — TOC Progression Note (Signed)
Transition of Care [facility]) - Progression Note    Patient Details  Name: [name] Price MRN: [id number] Date of Birth: October 19, 1940  Transition of Care [facility]) CM/SW Contact  Joanne Chars, LCSW Phone Number: 10/19/2020, 9:38 AM  Clinical Narrative:   CSW met with pt to confirm [facility] services, which pt does want.  Pt does not want wheelchair or 3n1.  Husband available for transport home.    Expected Discharge Plan: [place] Barriers to Discharge: Continued Medical Work up  Expected Discharge Plan and Services Expected Discharge Plan: [place] Choice: [place] arrangements for the past 2 months: Single Family Home                           HH Arranged: PT, OT HH Agency: [place] Date [place]: 10/12/20 Time HH Agency Contacted: 1200 Representative spoke with at [place]: [place] ([place]) Interventions    Readmission Risk Interventions No flowsheet data found.

## 2020-10-19 NOTE — Discharge Instructions (Signed)
WOUND CARE: place Xeroform gauze over the wound and wrap with Kerlix

## 2020-10-19 NOTE — TOC Transition Note (Addendum)
Transition of Care [facility]) - CM/SW Discharge Note   Patient Details  Name: [name] Price MRN: [id number] Date of Birth: August 16, 1940  Transition of Care [facility]) CM/SW Contact:  Joanne Chars, LCSW Phone Number: 10/19/2020, 1:04 PM   Clinical Narrative:   Pt discharging home with [facility]. (Confirmed with [place], covering for [place])  Pt declines recommended DME.  Husband will transport her home.  No other needs identified.     Final next level of care: [place] Barriers to Discharge: Barriers Resolved   Patient Goals and CMS Choice Patient states their goals for this hospitalization and ongoing recovery are:: "be able to walk to the bathroom" CMS Medicare.gov Compare Post Acute Care list provided to:: Patient Choice offered to / list presented to : Patient  Discharge Placement                       Discharge Plan and Services     Post Acute Care Choice: Home Health          DME Arranged: N/A         HH Arranged: RN [place] Agency: [place] Date [facility] Agency Contacted: 10/12/20 Time HH Agency Contacted: 1200 Representative spoke with at [place]: [place] ([place]) Interventions     Readmission Risk Interventions No flowsheet data found.

## 2020-10-19 NOTE — Progress Notes (Signed)
PT Cancellation Note  Patient Details Name: [name] Price MRN: [id number] DOB: 30-Dec-1940   Cancelled Treatment:    Reason Eval/Treat Not Completed: Other (comment).  States another therapist has walked her, declining PT as she is awaiting SNF placement.  Retry as time and pt allow.   Ramond Dial 10/19/2020, 2:04 PM  Mee Hives, PT MS Acute Rehab Dept. Number: [place] and [place]

## 2020-10-19 NOTE — Progress Notes (Signed)
Occupational Therapy Treatment Patient Details Name: [name] Price MRN: [id number] DOB: Jun 21, 1940 Today's Date: 10/19/2020    History of present illness Pt is a 80 y/o female admitted 8/26 secondary to R foot cellulitis and early sepsis. PMH includes breast cancer and bilateral THA.   OT comments  Patient seen by skilled OT to address bed mobility, functional mobility, and grooming standing at sink.  Patient making good progress with OT with MI for bed mobility and supervision for mobility and grooming tasks.  Acute OT to continue to follow.   Follow Up Recommendations  Home health OT;Supervision/Assistance - 24 hour    Equipment Recommendations  3 in 1 bedside commode    Recommendations for Other Services      Precautions / Restrictions Precautions Precautions: Fall Precaution Comments: Rt foot cellulitis with edema and blisters Restrictions Weight Bearing Restrictions: No RLE Weight Bearing: Weight bearing as tolerated       Mobility Bed Mobility Overal bed mobility: Needs Assistance Bed Mobility: Supine to Sit;Sit to Supine     Supine to sit: Modified independent (Device/Increase time) Sit to supine: Modified independent (Device/Increase time)   General bed mobility comments: mod I for increased time and use of x1 bedrail    Transfers Overall transfer level: Needs assistance Equipment used: Rolling walker (2 wheeled) Transfers: Sit to/from Stand Sit to Stand: Supervision Stand pivot transfers: Supervision            Balance Overall balance assessment: Needs assistance Sitting-balance support: No upper extremity supported;Feet supported Sitting balance-Leahy Scale: Good     Standing balance support: No upper extremity supported;During functional activity Standing balance-Leahy Scale: Fair Standing balance comment: performed grooming standing at sink with no UE support                           ADL either performed or assessed with  clinical judgement   ADL Overall ADL's : Needs assistance/impaired     Grooming: Wash/dry hands;Wash/dry face;Brushing hair;Supervision/safety;Standing Grooming Details (indicate cue type and reason): Performed standing at sink                             Functional mobility during ADLs: Supervision/safety;Rolling walker General ADL Comments: demonstrated good safety with RW use     Vision       Perception     Praxis      Cognition Arousal/Alertness: Awake/alert Behavior During Therapy: WFL for tasks assessed/performed Overall Cognitive Status: Within Functional Limits for tasks assessed                                 General Comments: good safety awareness        Exercises     Shoulder Instructions       General Comments      Pertinent Vitals/ Pain       Pain Assessment: No/denies pain  Home Living                                          Prior Functioning/Environment              Frequency  Min 2X/week        Progress Toward Goals  OT Goals(current goals can now be found in  the care plan section)  Progress towards OT goals: Progressing toward goals  Acute Rehab OT Goals Patient Stated Goal: to go home OT Goal Formulation: With patient Time For Goal Achievement: 10/25/20 Potential to Achieve Goals: Good ADL Goals Pt Will Perform Grooming: with modified independence;standing Pt Will Perform Upper Body Dressing: Independently Pt Will Perform Lower Body Dressing: with modified independence;sit to/from stand Pt Will Transfer to Toilet: with modified independence;ambulating Pt Will Perform Toileting - Clothing Manipulation and hygiene: with modified independence;sit to/from stand Pt Will Perform Tub/Shower Transfer: Tub transfer;Shower transfer;tub bench;rolling walker  Plan Discharge plan remains appropriate;Frequency remains appropriate    Co-evaluation                 AM-PAC OT "6  Clicks" Daily Activity     Outcome Measure   Help from another person eating meals?: None Help from another person taking care of personal grooming?: A Little Help from another person toileting, which includes using toliet, bedpan, or urinal?: A Little Help from another person bathing (including washing, rinsing, drying)?: A Little Help from another person to put on and taking off regular upper body clothing?: None Help from another person to put on and taking off regular lower body clothing?: A Little 6 Click Score: 20    End of Session Equipment Utilized During Treatment: Gait belt;Rolling walker  OT Visit Diagnosis: Unsteadiness on feet (R26.81);Muscle weakness (generalized) (M62.81);Pain Pain - Right/Left: Right Pain - part of body: Leg;Ankle and joints of foot   Activity Tolerance Patient tolerated treatment well   Patient Left in bed;with call bell/phone within reach   Nurse Communication Mobility status        Time: [id number] OT Time Calculation (min): 17 min  Charges: OT General Charges $OT Visit: 1 Visit OT Treatments $Self Care/Home Management : 8-22 mins  Lodema Hong, [place] 10/19/2020, 9:35 AM

## 2020-10-21 DIAGNOSIS — K5730 Diverticulosis of large intestine without perforation or abscess without bleeding: Secondary | ICD-10-CM | POA: Diagnosis not present

## 2020-10-21 DIAGNOSIS — D649 Anemia, unspecified: Secondary | ICD-10-CM | POA: Diagnosis not present

## 2020-10-21 DIAGNOSIS — E876 Hypokalemia: Secondary | ICD-10-CM | POA: Diagnosis not present

## 2020-10-21 DIAGNOSIS — M94261 Chondromalacia, right knee: Secondary | ICD-10-CM | POA: Diagnosis not present

## 2020-10-21 DIAGNOSIS — I10 Essential (primary) hypertension: Secondary | ICD-10-CM | POA: Diagnosis not present

## 2020-10-21 DIAGNOSIS — A419 Sepsis, unspecified organism: Secondary | ICD-10-CM | POA: Diagnosis not present

## 2020-10-21 DIAGNOSIS — E7800 Pure hypercholesterolemia, unspecified: Secondary | ICD-10-CM | POA: Diagnosis not present

## 2020-10-21 DIAGNOSIS — Z853 Personal history of malignant neoplasm of breast: Secondary | ICD-10-CM | POA: Diagnosis not present

## 2020-10-21 DIAGNOSIS — L03115 Cellulitis of right lower limb: Secondary | ICD-10-CM | POA: Diagnosis not present

## 2020-10-21 DIAGNOSIS — H8110 Benign paroxysmal vertigo, unspecified ear: Secondary | ICD-10-CM | POA: Diagnosis not present

## 2020-10-21 DIAGNOSIS — K219 Gastro-esophageal reflux disease without esophagitis: Secondary | ICD-10-CM | POA: Diagnosis not present

## 2020-10-21 DIAGNOSIS — F32A Depression, unspecified: Secondary | ICD-10-CM | POA: Diagnosis not present

## 2020-10-22 DIAGNOSIS — L03115 Cellulitis of right lower limb: Secondary | ICD-10-CM | POA: Diagnosis not present

## 2020-10-22 DIAGNOSIS — I10 Essential (primary) hypertension: Secondary | ICD-10-CM | POA: Diagnosis not present

## 2020-10-22 DIAGNOSIS — D649 Anemia, unspecified: Secondary | ICD-10-CM | POA: Diagnosis not present

## 2020-10-22 DIAGNOSIS — A419 Sepsis, unspecified organism: Secondary | ICD-10-CM | POA: Diagnosis not present

## 2020-10-22 DIAGNOSIS — E876 Hypokalemia: Secondary | ICD-10-CM | POA: Diagnosis not present

## 2020-10-22 DIAGNOSIS — M94261 Chondromalacia, right knee: Secondary | ICD-10-CM | POA: Diagnosis not present

## 2020-10-24 ENCOUNTER — Telehealth: Payer: Self-pay | Admitting: Family Medicine

## 2020-10-24 DIAGNOSIS — S90829S Blister (nonthermal), unspecified foot, sequela: Secondary | ICD-10-CM

## 2020-10-24 NOTE — Telephone Encounter (Signed)
Home Health verbal orders Caller Name: [place] Name:  [facility]  Callback number: [phone]  Requesting OT/PT/Skilled nursing/Social Work/Speech: pt  Reason: mobility   Frequency: 1w1 1w2-3 weeks  Please forward to [facility] pool or providers CMA

## 2020-10-24 NOTE — Telephone Encounter (Signed)
Patient call in stated she need's to have her wound looked at .Appointment made for a hospital follow up . Would like a call back .

## 2020-10-24 NOTE — Telephone Encounter (Signed)
Home Health verbal orders Caller Name: [place] Name: [place] number: [phone]  Requesting OT/PT/Skilled nursing/Social Work/Speech:  Needs 1 PRN Needs home health 1xW for 9 weeks  Also requesting patient go to wound center. States she has a bad wound on her right foot that she thinks a specialist needs to check out.

## 2020-10-24 NOTE — Telephone Encounter (Signed)
I called to verify with [name] Price PT with [facility] and he is requesting verbal orders for [name] Price PT  1 x a wk for 1 wk  and 2 x a week every other week for 3 wks. I also spoke with [name] Price with [facility] requesting verbal orders for [name] Price skilled nursing  1 x a wk for 9 wks and also requesting one PRN [facility] nurse visit.  [name] Price is also requesting a referral to the wound Price for wound care for top of rt foot to ankle due to an area turned from red to blister and then blister burst. Now [name] Price said the area on rt foot to ankle looks very unusual. [name] Price said she has not seen anything like this before; [name] Price described as where the blister burst it looks like the white skin on a chicken breast . Pt said at this time using Xeroform until otherwise directed by wound care. Sending note to Dr Damita Dunnings.

## 2020-10-25 NOTE — Telephone Encounter (Signed)
Please give the order for PT and HH.  I put in the referral to wound care.  She did have cellulitis/hemorrhagic blister and I would not expect it to be completely healed at this point.  Please see about getting her a hospital follow-up office visit.  Thanks.

## 2020-10-26 NOTE — Telephone Encounter (Signed)
Patient called in regarding status of verbal orders for Home health . Would like a call back # [phone]

## 2020-10-27 DIAGNOSIS — L03115 Cellulitis of right lower limb: Secondary | ICD-10-CM | POA: Diagnosis not present

## 2020-10-27 DIAGNOSIS — E876 Hypokalemia: Secondary | ICD-10-CM | POA: Diagnosis not present

## 2020-10-27 DIAGNOSIS — A419 Sepsis, unspecified organism: Secondary | ICD-10-CM | POA: Diagnosis not present

## 2020-10-27 DIAGNOSIS — I10 Essential (primary) hypertension: Secondary | ICD-10-CM | POA: Diagnosis not present

## 2020-10-27 DIAGNOSIS — M94261 Chondromalacia, right knee: Secondary | ICD-10-CM | POA: Diagnosis not present

## 2020-10-27 DIAGNOSIS — D649 Anemia, unspecified: Secondary | ICD-10-CM | POA: Diagnosis not present

## 2020-10-27 NOTE — Telephone Encounter (Signed)
I put in another wound clinic referral.  Thanks.

## 2020-10-27 NOTE — Telephone Encounter (Signed)
Left message for [name] Price with verbal orders as requested. Also called patient to let her know this was done along with the wound clinic referral.

## 2020-10-28 ENCOUNTER — Telehealth: Payer: Self-pay | Admitting: Family Medicine

## 2020-10-28 NOTE — Telephone Encounter (Signed)
Pt called wanting to know do she need to continue these 2 medication that the hospital put her on for infection.amoxicillin-clavulanate (AUGMENTIN) 875-125 MG tablet doxycycline (VIBRA-TABS) 100 MG tablet

## 2020-10-28 NOTE — Telephone Encounter (Signed)
Spoke with patient and moved appt up to be seen by Romilda Garret, NP Monday at 10:20 am. Sending to PCP and [facility] as fyi. Patient has just enough abx to make it thru the weekend.

## 2020-10-28 NOTE — Telephone Encounter (Signed)
Patient has two days left of medication and wants to know if she needs more. Patient has appt with you on 11/04/20 at 10:30am and is not better. Also patient called the wound clinic herself to get an appt and they can not see her until November. Patient is very upset and stated her foot is not doing good. She needs to be seen sooner then that. A message has also been sent to referral coordinator as well.

## 2020-10-28 NOTE — Telephone Encounter (Signed)
LMTCB

## 2020-10-28 NOTE — Telephone Encounter (Signed)
Does anyone have any clinic availability on Monday, so that they could see the patient?  If so then that is probably the best option.    If she is clearly declining/worse over the weekend, then she needs to get rechecked.  The other option would be to add her onto the schedule at 430 with me on Monday.  If she has antibiotics to take over the weekend, that I would do that and we can make additional plans at the visit on Monday.

## 2020-10-29 ENCOUNTER — Inpatient Hospital Stay (HOSPITAL_BASED_OUTPATIENT_CLINIC_OR_DEPARTMENT_OTHER)
Admission: EM | Admit: 2020-10-29 | Discharge: 2020-11-10 | DRG: 574 | Disposition: A | Discharge status: Home Health | Payer: Medicare Other | Attending: Internal Medicine | Admitting: Internal Medicine

## 2020-10-29 ENCOUNTER — Emergency Department (HOSPITAL_BASED_OUTPATIENT_CLINIC_OR_DEPARTMENT_OTHER): Payer: Medicare Other | Admitting: Radiology

## 2020-10-29 ENCOUNTER — Emergency Department (HOSPITAL_BASED_OUTPATIENT_CLINIC_OR_DEPARTMENT_OTHER): Payer: Medicare Other

## 2020-10-29 ENCOUNTER — Other Ambulatory Visit: Payer: Self-pay

## 2020-10-29 ENCOUNTER — Encounter (HOSPITAL_BASED_OUTPATIENT_CLINIC_OR_DEPARTMENT_OTHER): Payer: Self-pay

## 2020-10-29 DIAGNOSIS — L97511 Non-pressure chronic ulcer of other part of right foot limited to breakdown of skin: Secondary | ICD-10-CM | POA: Diagnosis not present

## 2020-10-29 DIAGNOSIS — Z800 Family history of malignant neoplasm of digestive organs: Secondary | ICD-10-CM | POA: Diagnosis not present

## 2020-10-29 DIAGNOSIS — L97512 Non-pressure chronic ulcer of other part of right foot with fat layer exposed: Secondary | ICD-10-CM | POA: Diagnosis not present

## 2020-10-29 DIAGNOSIS — Z96612 Presence of left artificial shoulder joint: Secondary | ICD-10-CM | POA: Diagnosis not present

## 2020-10-29 DIAGNOSIS — M7989 Other specified soft tissue disorders: Secondary | ICD-10-CM | POA: Diagnosis not present

## 2020-10-29 DIAGNOSIS — L03115 Cellulitis of right lower limb: Secondary | ICD-10-CM | POA: Diagnosis not present

## 2020-10-29 DIAGNOSIS — Z1629 Resistance to other single specified antibiotic: Secondary | ICD-10-CM | POA: Diagnosis not present

## 2020-10-29 DIAGNOSIS — S91301A Unspecified open wound, right foot, initial encounter: Secondary | ICD-10-CM | POA: Diagnosis not present

## 2020-10-29 DIAGNOSIS — Z20822 Contact with and (suspected) exposure to covid-19: Secondary | ICD-10-CM | POA: Diagnosis present

## 2020-10-29 DIAGNOSIS — E7800 Pure hypercholesterolemia, unspecified: Secondary | ICD-10-CM | POA: Diagnosis present

## 2020-10-29 DIAGNOSIS — Z923 Personal history of irradiation: Secondary | ICD-10-CM

## 2020-10-29 DIAGNOSIS — Z96643 Presence of artificial hip joint, bilateral: Secondary | ICD-10-CM | POA: Diagnosis present

## 2020-10-29 DIAGNOSIS — E872 Acidosis: Secondary | ICD-10-CM | POA: Diagnosis not present

## 2020-10-29 DIAGNOSIS — M65871 Other synovitis and tenosynovitis, right ankle and foot: Secondary | ICD-10-CM | POA: Diagnosis not present

## 2020-10-29 DIAGNOSIS — D638 Anemia in other chronic diseases classified elsewhere: Secondary | ICD-10-CM | POA: Diagnosis not present

## 2020-10-29 DIAGNOSIS — Z885 Allergy status to narcotic agent status: Secondary | ICD-10-CM

## 2020-10-29 DIAGNOSIS — L089 Local infection of the skin and subcutaneous tissue, unspecified: Secondary | ICD-10-CM | POA: Diagnosis not present

## 2020-10-29 DIAGNOSIS — B9562 Methicillin resistant Staphylococcus aureus infection as the cause of diseases classified elsewhere: Secondary | ICD-10-CM | POA: Diagnosis not present

## 2020-10-29 DIAGNOSIS — Z853 Personal history of malignant neoplasm of breast: Secondary | ICD-10-CM

## 2020-10-29 DIAGNOSIS — Z23 Encounter for immunization: Secondary | ICD-10-CM

## 2020-10-29 DIAGNOSIS — L97319 Non-pressure chronic ulcer of right ankle with unspecified severity: Secondary | ICD-10-CM | POA: Diagnosis present

## 2020-10-29 DIAGNOSIS — I10 Essential (primary) hypertension: Secondary | ICD-10-CM | POA: Diagnosis present

## 2020-10-29 DIAGNOSIS — Z79899 Other long term (current) drug therapy: Secondary | ICD-10-CM

## 2020-10-29 DIAGNOSIS — A419 Sepsis, unspecified organism: Secondary | ICD-10-CM | POA: Diagnosis not present

## 2020-10-29 DIAGNOSIS — D509 Iron deficiency anemia, unspecified: Secondary | ICD-10-CM | POA: Diagnosis present

## 2020-10-29 DIAGNOSIS — D62 Acute posthemorrhagic anemia: Secondary | ICD-10-CM | POA: Diagnosis not present

## 2020-10-29 DIAGNOSIS — Z803 Family history of malignant neoplasm of breast: Secondary | ICD-10-CM

## 2020-10-29 DIAGNOSIS — D649 Anemia, unspecified: Secondary | ICD-10-CM | POA: Diagnosis present

## 2020-10-29 DIAGNOSIS — M810 Age-related osteoporosis without current pathological fracture: Secondary | ICD-10-CM | POA: Diagnosis not present

## 2020-10-29 DIAGNOSIS — L97514 Non-pressure chronic ulcer of other part of right foot with necrosis of bone: Secondary | ICD-10-CM | POA: Diagnosis not present

## 2020-10-29 DIAGNOSIS — F32A Depression, unspecified: Secondary | ICD-10-CM

## 2020-10-29 DIAGNOSIS — Z8249 Family history of ischemic heart disease and other diseases of the circulatory system: Secondary | ICD-10-CM | POA: Diagnosis not present

## 2020-10-29 DIAGNOSIS — E876 Hypokalemia: Secondary | ICD-10-CM | POA: Diagnosis not present

## 2020-10-29 DIAGNOSIS — M94261 Chondromalacia, right knee: Secondary | ICD-10-CM | POA: Diagnosis not present

## 2020-10-29 DIAGNOSIS — K219 Gastro-esophageal reflux disease without esophagitis: Secondary | ICD-10-CM | POA: Diagnosis not present

## 2020-10-29 DIAGNOSIS — M25471 Effusion, right ankle: Secondary | ICD-10-CM | POA: Diagnosis not present

## 2020-10-29 DIAGNOSIS — I96 Gangrene, not elsewhere classified: Secondary | ICD-10-CM | POA: Diagnosis present

## 2020-10-29 DIAGNOSIS — Z801 Family history of malignant neoplasm of trachea, bronchus and lung: Secondary | ICD-10-CM

## 2020-10-29 DIAGNOSIS — L97519 Non-pressure chronic ulcer of other part of right foot with unspecified severity: Secondary | ICD-10-CM | POA: Diagnosis present

## 2020-10-29 DIAGNOSIS — S91001A Unspecified open wound, right ankle, initial encounter: Secondary | ICD-10-CM | POA: Diagnosis not present

## 2020-10-29 DIAGNOSIS — L97509 Non-pressure chronic ulcer of other part of unspecified foot with unspecified severity: Secondary | ICD-10-CM | POA: Diagnosis not present

## 2020-10-29 LAB — CBC WITH DIFFERENTIAL/PLATELET
Abs Immature Granulocytes: 0.02 10*3/uL (ref 0.00–0.07)
Basophils Absolute: 0 10*3/uL (ref 0.0–0.1)
Basophils Relative: 1 %
Eosinophils Absolute: 0.1 10*3/uL (ref 0.0–0.5)
Eosinophils Relative: 1 %
HCT: 26.5 % — ABNORMAL LOW (ref 36.0–46.0)
Hemoglobin: 8.5 g/dL — ABNORMAL LOW (ref 12.0–15.0)
Immature Granulocytes: 0 %
Lymphocytes Relative: 11 %
Lymphs Abs: 0.8 10*3/uL (ref 0.7–4.0)
MCH: 30.7 pg (ref 26.0–34.0)
MCHC: 32.1 g/dL (ref 30.0–36.0)
MCV: 95.7 fL (ref 80.0–100.0)
Monocytes Absolute: 0.6 10*3/uL (ref 0.1–1.0)
Monocytes Relative: 8 %
Neutro Abs: 5.8 10*3/uL (ref 1.7–7.7)
Neutrophils Relative %: 79 %
Platelets: 330 10*3/uL (ref 150–400)
RBC: 2.77 MIL/uL — ABNORMAL LOW (ref 3.87–5.11)
RDW: 14.6 % (ref 11.5–15.5)
WBC: 7.3 10*3/uL (ref 4.0–10.5)
nRBC: 0 % (ref 0.0–0.2)

## 2020-10-29 LAB — COMPREHENSIVE METABOLIC PANEL
ALT: 7 U/L (ref 0–44)
AST: 12 U/L — ABNORMAL LOW (ref 15–41)
Albumin: 3 g/dL — ABNORMAL LOW (ref 3.5–5.0)
Alkaline Phosphatase: 53 U/L (ref 38–126)
Anion gap: 9 (ref 5–15)
BUN: 17 mg/dL (ref 8–23)
CO2: 26 mmol/L (ref 22–32)
Calcium: 8.7 mg/dL — ABNORMAL LOW (ref 8.9–10.3)
Chloride: 102 mmol/L (ref 98–111)
Creatinine, Ser: 0.56 mg/dL (ref 0.44–1.00)
GFR, Estimated: >60 mL/min (ref >60)
Glucose, Bld: 108 mg/dL — ABNORMAL HIGH (ref 70–99)
Potassium: 4 mmol/L (ref 3.5–5.1)
Sodium: 137 mmol/L (ref 135–145)
Total Bilirubin: 0.5 mg/dL (ref 0.3–1.2)
Total Protein: 5.9 g/dL — ABNORMAL LOW (ref 6.5–8.1)

## 2020-10-29 LAB — LACTIC ACID, PLASMA
Lactic Acid, Venous: 1.6 mmol/L (ref 0.5–1.9)
Lactic Acid, Venous: 0.9 mmol/L (ref 0.5–1.9)

## 2020-10-29 LAB — PHOSPHORUS: Phosphorus: 3.3 mg/dL (ref 2.5–4.6)

## 2020-10-29 LAB — CBC
HCT: 25.8 % — ABNORMAL LOW (ref 36.0–46.0)
Hemoglobin: 8.4 g/dL — ABNORMAL LOW (ref 12.0–15.0)
MCH: 31.3 pg (ref 26.0–34.0)
MCHC: 32.6 g/dL (ref 30.0–36.0)
MCV: 96.3 fL (ref 80.0–100.0)
Platelets: 325 10*3/uL (ref 150–400)
RBC: 2.68 MIL/uL — ABNORMAL LOW (ref 3.87–5.11)
RDW: 14.6 % (ref 11.5–15.5)
WBC: 6.7 10*3/uL (ref 4.0–10.5)
nRBC: 0 % (ref 0.0–0.2)

## 2020-10-29 LAB — TYPE AND SCREEN
ABO/RH(D): A NEG
Antibody Screen: NEGATIVE

## 2020-10-29 LAB — C-REACTIVE PROTEIN: CRP: 4.7 mg/dL — ABNORMAL HIGH (ref <1.0)

## 2020-10-29 LAB — MAGNESIUM: Magnesium: 1.9 mg/dL (ref 1.7–2.4)

## 2020-10-29 LAB — SEDIMENTATION RATE: Sed Rate: 65 mm/hr — ABNORMAL HIGH (ref 0–22)

## 2020-10-29 LAB — RESP PANEL BY RT-PCR (FLU A&B, COVID) ARPGX2
Influenza A by PCR: NEGATIVE
Influenza B by PCR: NEGATIVE
SARS Coronavirus 2 by RT PCR: NEGATIVE

## 2020-10-29 LAB — CK: Total CK: 15 U/L — ABNORMAL LOW (ref 38–234)

## 2020-10-29 MED ORDER — PANTOPRAZOLE SODIUM 40 MG IV SOLR
40.0000 mg | Freq: Two times a day (BID) | INTRAVENOUS | Status: DC
  Administered 2020-10-29 – 2020-11-01 (×7): 40 mg via INTRAVENOUS
  Filled 2020-10-29 (×7): qty 40

## 2020-10-29 MED ORDER — SODIUM CHLORIDE 0.9 % IV SOLN
75.0000 mL/h | INTRAVENOUS | Status: DC
  Administered 2020-10-29: 75 mL/h via INTRAVENOUS

## 2020-10-29 MED ORDER — ACETAMINOPHEN 325 MG PO TABS
650.0000 mg | ORAL_TABLET | Freq: Four times a day (QID) | ORAL | Status: DC | PRN
  Administered 2020-10-30 – 2020-11-07 (×13): 650 mg via ORAL
  Filled 2020-10-29 (×14): qty 2

## 2020-10-29 MED ORDER — VANCOMYCIN HCL IN DEXTROSE 1-5 GM/200ML-% IV SOLN
1000.0000 mg | Freq: Once | INTRAVENOUS | Status: AC
  Administered 2020-10-29: 1000 mg via INTRAVENOUS
  Filled 2020-10-29: qty 200

## 2020-10-29 MED ORDER — ACETAMINOPHEN 650 MG RE SUPP: 650.0000 mg | Freq: Four times a day (QID) | RECTAL | Status: DC | PRN

## 2020-10-29 MED ORDER — VANCOMYCIN HCL 1250 MG/250ML IV SOLN
1250.0000 mg | INTRAVENOUS | Status: DC
  Administered 2020-10-30: 1250 mg via INTRAVENOUS
  Filled 2020-10-29: qty 250

## 2020-10-29 MED ORDER — SODIUM CHLORIDE 0.9 % IV SOLN
2.0000 g | Freq: Once | INTRAVENOUS | Status: AC
  Administered 2020-10-29: 2 g via INTRAVENOUS
  Filled 2020-10-29: qty 2

## 2020-10-29 MED ORDER — FENTANYL CITRATE PF 50 MCG/ML IJ SOSY: 12.5000 ug | PREFILLED_SYRINGE | INTRAMUSCULAR | Status: DC | PRN

## 2020-10-29 MED ORDER — SODIUM CHLORIDE 0.9 % IV SOLN
2.0000 g | Freq: Two times a day (BID) | INTRAVENOUS | Status: DC
  Administered 2020-10-30 – 2020-10-31 (×3): 2 g via INTRAVENOUS
  Filled 2020-10-29 (×3): qty 2

## 2020-10-29 NOTE — ED Provider Notes (Addendum)
[facility] DE[facility] Provider Note   CSN: [id number] Arrival date & time: 10/29/20  1304     History No chief complaint on file.   [name] Price is a 80 y.o. female.  HPI   Patient presents with wound to the left foot.  This started about a month ago, she required 2-week hospitalization for IV antibiotics.  She was discharged and started on a course of doxycycline and Augmentin, states that the erythema has been continuing since then.  She is also having purulent drainage which has been worsening despite outpatient antibiotics.  She denies any fevers at home, no history of diabetes or poor circulatory history.  She is taking Aleve for pain, this is helped.  Past Medical History:  Diagnosis Date   Allergy    Anxiety    Arthritis    Blood transfusion without reported diagnosis    as baby   Breast cancer ([place]) 1999-2000   s/p lumpectomy and radiation, no chemo-right   History of chicken pox    History of hip replacement    Hot flashes    treated wtih paxil   Hyperlipidemia    Osteoporosis    DXA 11/12/17   UTI (urinary tract infection)     Patient Active Problem List   Diagnosis Date Noted   Sepsis ([place])    Rash 09/05/2020   History of hip replacement    Advance care planning 06/25/2017   Health care maintenance 06/25/2017   Hot flashes 06/25/2017   BPV (benign positional vertigo) 03/15/2015   Ganglion cyst of wrist 02/23/2015   Extensor tendon rupture of hand 02/01/2015   Left wrist effusion 01/26/2015   KNEE PAIN, RIGHT 11/13/2007   CHONDROMALACIA PATELLA, LEFT 03/19/2007   Osteoporosis 03/19/2007   HEMATURIA, HX OF 03/19/2007   DIVERTICULOSIS, COLON 08/10/2002   HYPERCHOLESTEROLEMIA 06/05/1996    Past Surgical History:  Procedure Laterality Date   BREAST SURGERY Right    CATARACT EXTRACTION W/PHACO Left 12/28/2014   Procedure: CATARACT EXTRACTION PHACO AND INTRAOCULAR LENS PLACEMENT ([place]);  Surgeon: Birder Robson, MD;  Location:  [facility] ORS;  Service: Ophthalmology;  Laterality: Left;  [place] 00:38 AP% 19.9 CDE 7.70 fluid pack lot # [id number]   CATARACT EXTRACTION W/PHACO Right 07/10/2016   Procedure: CATARACT EXTRACTION PHACO AND INTRAOCULAR LENS PLACEMENT ([facility]);  Surgeon: Birder Robson, MD;  Location: [facility] ORS;  Service: Ophthalmology;  Laterality: Right;  [place] 00:38 AP% 18.3 CDE 7.06 Fluid pack lot # [id number]   COLONOSCOPY  2016   EYE SURGERY     JOINT REPLACEMENT Bilateral    hip  / shoulder   OOPHORECTOMY Left    POLYPECTOMY     TOTAL HIP ARTHROPLASTY Bilateral    (2) Two   TOTAL SHOULDER REPLACEMENT       OB History   No obstetric history on file.     Family History  Problem Relation Age of Onset   Lung cancer Mother    Heart disease Mother    Colon cancer Mother        late 107's   Cancer Father    Colon cancer Father 11       died at 33   Breast cancer Maternal Aunt    Breast cancer Maternal Aunt    Breast cancer Maternal Aunt    Rectal cancer Neg Hx    Stomach cancer Neg Hx    Crohn's disease Neg Hx    Esophageal cancer Neg Hx     Social History  Tobacco Use   Smoking status: Never   Smokeless tobacco: Never  Vaping Use   Vaping Use: Never used  Substance Use Topics   Alcohol use: No    Comment: rare once a year   Drug use: No    Home Medications Prior to Admission medications   Medication Sig Start Date End Date Taking? Authorizing Provider  acetaminophen (TYLENOL) 325 MG tablet Take 2 tablets (650 mg total) by mouth every 6 (six) hours as needed for mild pain (or Fever >/= 101). 10/19/20   Cherene Altes, MD  acidophilus (RISAQUAD) CAPS capsule Take 1 capsule by mouth daily. 10/20/20   Cherene Altes, MD  amoxicillin-clavulanate (AUGMENTIN) 875-125 MG tablet Take 1 tablet by mouth every 12 (twelve) hours for 10 days. 10/19/20 10/29/20  Cherene Altes, MD  calcium carbonate (OS-CAL) 600 MG TABS Take 600 mg by mouth 2 (two) times daily with a meal.    [provider]  Cholecalciferol (VITAMIN D) 2000 units tablet Take 2,000 Units by mouth daily.    [provider]  Cyanocobalamin 1000 MCG TBCR Take 1,000 mcg by mouth daily.    [provider]  doxycycline (VIBRA-TABS) 100 MG tablet Take 1 tablet (100 mg total) by mouth every 12 (twelve) hours for 10 days. 10/19/20 10/29/20  Cherene Altes, MD  hydrOXYzine (ATARAX/VISTARIL) 25 MG tablet Take 1 tablet (25 mg total) by mouth 3 (three) times daily as needed for itching. 09/05/20   Lesleigh Noe, MD  metoprolol tartrate (LOPRESSOR) 25 MG tablet Take 0.5 tablets (12.5 mg total) by mouth 2 (two) times daily. 10/19/20 11/18/20  Cherene Altes, MD  PARoxetine (PAXIL-CR) 25 MG 24 hr tablet Take 25 mg by mouth daily.    [provider]    Allergies    Codeine  Review of Systems   Review of Systems  Constitutional:  Negative for fever.  Respiratory:  Negative for shortness of breath.   Cardiovascular:  Negative for chest pain.  Gastrointestinal:  Negative for nausea and vomiting.  Musculoskeletal:  Positive for myalgias. Negative for arthralgias.  Skin:  Positive for color change and wound.   Physical Exam Updated Vital Signs BP 122/63   Pulse 81   Temp 98.8 F (37.1 C) (Oral)   Resp 16   Ht '5\' 3"'$  (1.6 m)   Wt 64.9 kg   SpO2 97%   BMI 25.33 kg/m   Physical Exam Vitals and nursing note reviewed. Exam conducted with a chaperone present.  Constitutional:      General: She is not in acute distress.    Appearance: Normal appearance.  HENT:     Head: Normocephalic and atraumatic.  Eyes:     General: No scleral icterus.    Extraocular Movements: Extraocular movements intact.     Pupils: Pupils are equal, round, and reactive to light.  Cardiovascular:     Rate and Rhythm: Normal rate and regular rhythm.     Comments: DP palpable 1+. Pulmonary:     Effort: Pulmonary effort is normal.     Breath sounds: Normal breath sounds.  Skin:    Coloration: Skin is not  jaundiced.     Findings: Lesion present.     Comments: See attached photo  Neurological:     Mental Status: She is alert. Mental status is at baseline.     Coordination: Coordination normal.     ED Results / Procedures / Treatments   Labs (all labs ordered are listed,  but only abnormal results are displayed) Labs Reviewed - No data to display  EKG None  Radiology No results found.  Procedures Procedures   Medications Ordered in ED Medications - No data to display  ED Course  I have reviewed the triage vital signs and the nursing notes.  Pertinent labs & imaging results that were available during my care of the patient were reviewed by me and considered in my medical decision making (see chart for details).  Clinical Course as of 10/29/20 1841  Sat Oct 29, 2020  1511 Hemoglobin(!): 8.5 Decreased by 2.0 in the last 2 weeks. [HS]    Clinical Course User Index [HS] Sherrill Raring, PA-C   MDM Rules/Calculators/A&P                           Patient is not febrile or tachycardic, not sepsis.  She does have an obvious infected wound she is at the foot, pulses are palpable, neurovascularly intact.  Will check cultures, lactic, blood counts.  Suspect patient will likely need admission for IV antibiotics.  Radiograph findings consistent with possible necrotizing fasciitis involving the ankle.  Will consult orthopedic surgery and then plan for hospital admission.   Spoke with Dr. Amalia Hailey with podiatry, states it is out of their scope of practice because involves the ankle.   Spoke with Dr. Rolena Infante, he will consult with the patient.  Advising [facility] transfer and likely BKA.  Requesting that she have an MRI of the foot done.  Unfortunately, no bed available at [facility] at the time.  Dr. Rolena Infante would like to see the patient before midnight and had the MRI done preferably tonight.  we will transfer the patient to [facility] for MRI and to be evaluated.   Discussed HPI, physical  exam and plan of care for this patient with attending Lavenia Atlas. The attending physician evaluated this patient as part of a shared visit and agrees with plan of care.   Final Clinical Impression(s) / ED Diagnoses Final diagnoses:  None    Rx / DC Orders ED Discharge Orders     None        Sherrill Raring, PA-C 10/29/20 [place], PA-C 10/29/20 1843    Lorelle Gibbs, DO 11/05/20 [phone]

## 2020-10-29 NOTE — Plan of Care (Signed)

## 2020-10-29 NOTE — H&P (Signed)
[name] Price INO:[id number] DOB: 02-21-1940 DOA: 10/29/2020     PCP: Tonia Ghent, MD   Outpatient Specialists: * [facility]   Patient arrived to ER on 10/29/20 at 1304 Referred by Attending Toy Baker, MD   Patient coming from: home Lives With family   Chief Complaint: Right foot pain   HPI: [name] Price is a 80 y.o. female with medical history significant of right foot cellulitis, anemia GERD, anxiety, depression     Presented with   continuous inflammation drainage from right foot Patient has recently been admitted from 27 August to 7 September for right foot cellulitis and early sepsis comes back with similar symptoms.  Discharged on Augmentin and doxycycline Patient has been compliant her medications took her last antibiotics today.  She has been followed by home health nurse noted to have still edema and weeping.  No generalized signs no nausea vomiting or diarrhea no fevers or chills But she continues to have purulent discharge. Denies any history of spider bites never seen any spiders in the house.  She does states that about a month ago she woke up with a blister eventually popped and became significant ulceration No prior history of diabetes or peripheral vascular disease  Reports her stools have been black for the past 2 wks but she though it was due to antibiotics Still a bit loose no diarrhea No blood in stool  She used to be anemic when she was young  Has  been vaccinated against COVID    Initial COVID TEST  NEGATIVE   Lab Results  Component Value Date   SARSCOV2NAA NEGATIVE 10/29/2020   [place] NEGATIVE 10/08/2020     Regarding pertinent Chronic problems:      HTN on metoprol  Chronic anemia - baseline hg Hemoglobin & Hematocrit  Recent Labs    10/16/20 0447 10/18/20 0029 10/29/20 1441  HGB 9.4* 9.1* 8.5*    While in ER:  Noted cellulitis possible gas.  Ordered cefepime vancomycin spoke to  orthopedics who recommended MRI of the foot and will see in consult on arrival may need amputation tomorrow    ED Triage Vitals  Enc Vitals Group     BP 10/29/20 1316 113/68     Pulse Rate 10/29/20 1316 79     Resp 10/29/20 1316 16     Temp 10/29/20 1316 98.8 F (37.1 C)     Temp Source 10/29/20 1316 Oral     SpO2 10/29/20 1314 99 %     Weight 10/29/20 1318 143 lb (64.9 kg)     Height 10/29/20 1318 _0  (1.6 m)     Head Circumference --      Peak Flow --      Pain Score 10/29/20 1317 5     Pain Loc --      Pain Edu? --      Excl. in [place]? --   TMAX(24)@     _________________________________________ Significant initial  Findings: Abnormal Labs Reviewed  COMPREHENSIVE METABOLIC PANEL - Abnormal; Notable for the following components:      Result Value   Glucose, Bld 108 (*)    Calcium 8.7 (*)    Total Protein 5.9 (*)    Albumin 3.0 (*)    AST 12 (*)    All other components within normal limits  CBC WITH DIFFERENTIAL/PLATELET - Abnormal; Notable for the following components:   RBC 2.77 (*)    Hemoglobin 8.5 (*)    HCT  26.5 (*)    All other components within normal limits   ____________________________________________ Ordered  Right foot 1. Soft tissue swelling and air overlying the dorsal aspect of the foot and ankle worrisome for infection. Abscess or necrotizing infection cannot be excluded. ________  ECG: Ordered Personally reviewed by me showing: HR : 106 Rhythm:  NSR,   no evidence of ischemic changes QTC 461   The recent clinical data is shown below. Vitals:   10/29/20 1900 10/29/20 1915 10/29/20 2000 10/29/20 2049  BP: (!) 108/58  (!) 109/58 (!) 141/108  Pulse:  77 80 81  Resp:   18 14  Temp:   98.5 F (36.9 C) 99.3 F (37.4 C)  TempSrc:   Oral Oral  SpO2:  95% 95% 99%  Weight:      Height:         WBC     Component Value Date/Time   WBC 7.3 10/29/2020 1441   LYMPHSABS 0.8 10/29/2020 1441   LYMPHSABS 1.3 08/20/2013 1245   MONOABS 0.6  10/29/2020 1441   MONOABS 0.5 08/20/2013 1245   EOSABS 0.1 10/29/2020 1441   EOSABS 0.0 08/20/2013 1245   BASOSABS 0.0 10/29/2020 1441   BASOSABS 0.0 08/20/2013 1245     Lactic Acid, Venous    Component Value Date/Time   LATICACIDVEN 0.9 10/29/2020 1637       UA not ordered    Results for orders placed or performed during the hospital encounter of 10/29/20  Resp Panel by RT-PCR (Flu A&B, Covid) Nasopharyngeal Swab     Status: None   Collection Time: 10/29/20  2:32 PM   Specimen: Nasopharyngeal Swab; Nasopharyngeal(NP) swabs in vial transport medium  Result Value Ref Range Status   SARS Coronavirus 2 by RT PCR NEGATIVE NEGATIVE Final         Influenza A by PCR NEGATIVE NEGATIVE Final   Influenza B by PCR NEGATIVE NEGATIVE Final          ___________________________________________ ER Provider Called:  orthopedics   Dr. Rolena Infante They Recommend admit to medicine   Will see on arrival _______________________________________________ Hospitalist was called for admission for right foot ulcer cellulitis  The following Work up has been ordered so far:  Orders Placed This Encounter  Procedures   Blood culture (routine x 2)   Resp Panel by RT-PCR (Flu A&B, Covid) Nasopharyngeal Swab   DG Foot Complete Right   MR FOOT RIGHT W WO CONTRAST   Comprehensive metabolic panel   CBC with Differential   Lactic acid, plasma   Cardiac monitoring   Cardiac monitoring   Consult to orthopedic surgery   Consult to hospitalist   Admit to Inpatient (patient's expected length of stay will be greater than 2 midnights or inpatient only procedure)   Admit to Inpatient (patient's expected length of stay will be greater than 2 midnights or inpatient only procedure)   Admit to Inpatient (patient's expected length of stay will be greater than 2 midnights or inpatient only procedure)    Following Medications were ordered in ER: Medications  ceFEPIme (MAXIPIME) 2 g in sodium chloride 0.9 % 100 mL IVPB  (0 g Intravenous Stopped 10/29/20 1911)  vancomycin (VANCOCIN) IVPB 1000 mg/200 mL premix (0 mg Intravenous Stopped 10/29/20 1911)        Consult Orders  (From admission, onward)           Start     Ordered   10/29/20 1700  Consult to hospitalist  Called [facility] and requested  consult to hospitalist @ 17:04 (for [facility])  Once       Provider:  (Not yet assigned)  Question Answer Comment  Place call to: [facility]   Reason for Consult Admit      10/29/20 1659              OTHER Significant initial  Findings:  labs showing:    Recent Labs  Lab 10/29/20 1441  NA 137  K 4.0  CO2 26  GLUCOSE 108*  BUN 17  CREATININE 0.56  CALCIUM 8.7*    Cr   stable,    Lab Results  Component Value Date   CREATININE 0.56 10/29/2020   CREATININE 0.53 10/19/2020   CREATININE 0.53 10/18/2020    Recent Labs  Lab 10/29/20 1441  AST 12*  ALT 7  ALKPHOS 53  BILITOT 0.5  PROT 5.9*  ALBUMIN 3.0*   Lab Results  Component Value Date   CALCIUM 8.7 (L) 10/29/2020   PHOS 3.1 10/12/2020    Plt: Lab Results  Component Value Date   PLT 330 10/29/2020    COVID-19 Labs  No results for input(s): DDIMER, FERRITIN, LDH, CRP in the last 72 hours.  Lab Results  Component Value Date   SARSCOV2NAA NEGATIVE 10/29/2020   [place] NEGATIVE 10/08/2020        Recent Labs  Lab 10/29/20 1441  WBC 7.3  NEUTROABS 5.8  HGB 8.5*  HCT 26.5*  MCV 95.7  PLT 330    HG/HCT  stable,        Component Value Date/Time   HGB 8.5 (L) 10/29/2020 1441   HGB 13.9 08/20/2013 1245   HCT 26.5 (L) 10/29/2020 1441   HCT 41.6 08/20/2013 1245   MCV 95.7 10/29/2020 1441   MCV 92.6 08/20/2013 1245      No results for input(s): LIPASE, AMYLASE in the last 168 hours. No results for input(s): AMMONIA in the last 168 hours.      CBG (last 3)  No results for input(s): GLUCAP in the last 72 hours.        Cultures:    Component Value Date/Time   SDES BLOOD SITE NOT SPECIFIED  10/08/2020 1414   SPECREQUEST  10/08/2020 1414    BOTTLES DRAWN AEROBIC AND ANAEROBIC Blood Culture results may not be optimal due to an inadequate volume of blood received in culture bottles   CULT  10/08/2020 1414    NO GROWTH 5 DAYS Performed at [facility], [place] [address] [place], [place] [address]    REPTSTATUS 10/13/2020 FINAL 10/08/2020 1414     Radiological Exams on Admission: DG Foot Complete Right  Result Date: 10/29/2020 CLINICAL DATA:  Right foot wound for 1 month. EXAM: RIGHT FOOT COMPLETE - 3+ VIEW COMPARISON:  Right foot x-ray 10/07/2020. FINDINGS: There is soft tissue swelling of the entire foot. This is most significant along the dorsal aspect of the foot and ankle where there is soft tissue gas. There is no radiopaque foreign body. There is no acute fracture or dislocation. There is no periosteal reaction or cortical erosion identified. The joint spaces are maintained. IMPRESSION: 1. Soft tissue swelling and air overlying the dorsal aspect of the foot and ankle worrisome for infection. Abscess or necrotizing infection cannot be excluded. 2. No acute bony abnormality. Electronically Signed   By: Ronney Asters M.D.   On: 10/29/2020 15:21   _______________________________________________________________________________________________________ Latest  Blood pressure (!) 141/108, pulse 81, temperature 99.3 F (37.4 C), temperature source Oral,  resp. rate 14, height _0  (1.6 m), weight 64.9 kg, SpO2 99 %.   Review of Systems:    Pertinent positives include:  fatigue, leg pain   Constitutional:  No weight loss, night sweats, Fevers, chills, weight loss  HEENT:  No headaches, Difficulty swallowing,Tooth/dental problems,Sore throat,  No sneezing, itching, ear ache, nasal congestion, post nasal drip,  Cardio-vascular:  No chest pain, Orthopnea, PND, anasarca, dizziness, palpitations.no Bilateral lower extremity swelling  GI:  No heartburn, indigestion, abdominal pain,  nausea, vomiting, diarrhea, change in bowel habits, loss of appetite, melena, blood in stool, hematemesis Resp:  no shortness of breath at rest. No dyspnea on exertion, No excess mucus, no productive cough, No non-productive cough, No coughing up of blood.No change in color of mucus.No wheezing. Skin:  no rash or lesions. No jaundice GU:  no dysuria, change in color of urine, no urgency or frequency. No straining to urinate.  No flank pain.  Musculoskeletal:  No joint pain or no joint swelling. No decreased range of motion. No back pain.  Psych:  No change in mood or affect. No depression or anxiety. No memory loss.  Neuro: no localizing neurological complaints, no tingling, no weakness, no double vision, no gait abnormality, no slurred speech, no confusion  All systems reviewed and apart from [employer] all are negative _______________________________________________________________________________________________ Past Medical History:   Past Medical History:  Diagnosis Date   Allergy    Anxiety    Arthritis    Blood transfusion without reported diagnosis    as baby   Breast cancer ([place]) 1999-2000   s/p lumpectomy and radiation, no chemo-right   History of chicken pox    History of hip replacement    Hot flashes    treated wtih paxil   Hyperlipidemia    Osteoporosis    DXA 11/12/17   UTI (urinary tract infection)       Past Surgical History:  Procedure Laterality Date   BREAST SURGERY Right    CATARACT EXTRACTION W/PHACO Left 12/28/2014   Procedure: CATARACT EXTRACTION PHACO AND INTRAOCULAR LENS PLACEMENT ([place]);  Surgeon: Birder Robson, MD;  Location: [facility] ORS;  Service: Ophthalmology;  Laterality: Left;  [place] 00:38 AP% 19.9 CDE 7.70 fluid pack lot # [id number]   CATARACT EXTRACTION W/PHACO Right 07/10/2016   Procedure: CATARACT EXTRACTION PHACO AND INTRAOCULAR LENS PLACEMENT ([facility]);  Surgeon: Birder Robson, MD;  Location: [facility] ORS;  Service: Ophthalmology;  Laterality:  Right;  [place] 00:38 AP% 18.3 CDE 7.06 Fluid pack lot # [id number]   COLONOSCOPY  2016   EYE SURGERY     JOINT REPLACEMENT Bilateral    hip  / shoulder   OOPHORECTOMY Left    POLYPECTOMY     TOTAL HIP ARTHROPLASTY Bilateral    (2) Two   TOTAL SHOULDER REPLACEMENT      Social History:  Ambulatory   independently lately  walker       reports that she has never smoked. She has never used smokeless tobacco. She reports that she does not drink alcohol and does not use drugs.     Family History:   Family History  Problem Relation Age of Onset   Lung cancer Mother    Heart disease Mother    Colon cancer Mother        late 48's   Cancer Father    Colon cancer Father 66       died at 20   Breast cancer Maternal Aunt    Breast cancer Maternal Aunt  Breast cancer Maternal Aunt    Rectal cancer Neg Hx    Stomach cancer Neg Hx    Crohn's disease Neg Hx    Esophageal cancer Neg Hx    ______________________________________________________________________________________________ Allergies: Allergies  Allergen Reactions   Codeine Nausea And Vomiting     Prior to Admission medications   Medication Sig Start Date End Date Taking? Authorizing Provider  acetaminophen (TYLENOL) 325 MG tablet Take 2 tablets (650 mg total) by mouth every 6 (six) hours as needed for mild pain (or Fever >/= 101). 10/19/20   Cherene Altes, MD  acidophilus (RISAQUAD) CAPS capsule Take 1 capsule by mouth daily. 10/20/20   Cherene Altes, MD  amoxicillin-clavulanate (AUGMENTIN) 875-125 MG tablet Take 1 tablet by mouth every 12 (twelve) hours for 10 days. 10/19/20 10/29/20  Cherene Altes, MD  calcium carbonate (OS-CAL) 600 MG TABS Take 600 mg by mouth 2 (two) times daily with a meal.    [provider]  Cholecalciferol (VITAMIN D) 2000 units tablet Take 2,000 Units by mouth daily.    [provider]  Cyanocobalamin 1000 MCG TBCR Take 1,000 mcg by mouth daily.    [provider]  doxycycline (VIBRA-TABS) 100 MG tablet Take 1 tablet (100 mg total) by mouth every 12 (twelve) hours for 10 days. 10/19/20 10/29/20  Cherene Altes, MD  hydrOXYzine (ATARAX/VISTARIL) 25 MG tablet Take 1 tablet (25 mg total) by mouth 3 (three) times daily as needed for itching. 09/05/20   Lesleigh Noe, MD  metoprolol tartrate (LOPRESSOR) 25 MG tablet Take 0.5 tablets (12.5 mg total) by mouth 2 (two) times daily. 10/19/20 11/18/20  Cherene Altes, MD  PARoxetine (PAXIL-CR) 25 MG 24 hr tablet Take 25 mg by mouth daily.    [provider]    ___________________________________________________________________________________________________ Physical Exam: Vitals with BMI 10/29/2020 10/29/2020 10/29/2020  Height - - -  Weight - - -  BMI - - -  Systolic 269 485 -  Diastolic 462 58 -  Pulse 81 80 77     1. General:  in No  Acute distress  * Chronically ill *well *cachectic *toxic acutely ill -appearing 2. Psychological: Alert and  Oriented 3. Head/ENT:    Dry Mucous Membranes                          Head Non traumatic, neck supple                           Poor Dentition 4. SKIN: normal  decreased Skin turgor,  Skin clean Dry   Wound of right anke earlier in the day   Repeat tonight:       5. Heart: Regular rate and rhythm no  Murmur, no Rub or gallop 6. Lungs:  Clear to auscultation bilaterally, no wheezes or crackles   7. Abdomen: Soft,  non-tender, Non distended   obese   8. Lower extremities: no clubbing, cyanosis, no  edema 9. Neurologically Grossly intact, moving all 4 extremities equally  intact 10. MSK: Normal range of motion    Chart has been reviewed  ______________________________________________________________________________________________  Assessment/Plan 80 y.o. female with medical history significant of right foot cellulitis, anemia GERD, anxiety, depression   Admitted for cellulitis, right ankle foot ulcer  Present on Admission:  Right  foot infection ulcer/cellulitis - -admit per  cellulitis protocol will       continue current antibiotic  choice cefepime and vanc      plain films showed:   evidence of air  no evidence of osteomyelitis   no     foreign   objects       Will obtain MRSA screening,       obtain blood cultures  if febrile or septic     further antibiotic adjustment pending above results Ordered MRI discussed with orthopedics Dr, Rolena Infante will see in consult   Sepsis [facility]) -   sepsis criteria not met at this time sepsis ruled out      Anemia -chronic but worsening.  Will obtain anemia panel Hemoccult stool type and screen in case needs blood transfusion.  If evidence of iron deficiency anemia or blood in stool may need further work-up with GI   HTN (hypertension) -allow permissive hypertension for tonight   Other plan as per orders.  DVT prophylaxis:  SCD      Code Status:    Code Status: Prior FULL CODE  as per patient   I had personally discussed CODE STATUS with patient and family     Family Communication:   Family at  Bedside  plan of care was discussed   with   Husband   Disposition Plan:    To home once workup is complete and patient is stable   Following barriers for discharge:                                                       Anemia stable                             Pain controlled with PO medications                                                                                        Will likely need home health, home O2, set up                           Will need consultants to evaluate patient prior to discharge                            Consults called: orthopedics is aware  Admission status:  ED Disposition     ED Disposition  Admit   Condition  --   [place]: [facility] [[id number]]  Level of Care: Telemetry [5]  Admit to tele based on following criteria: Other see comments  Comments: risk of sepsis  May [facility] to [facility]  or [facility] if equivalent level of care is available:: Yes  Interfacility transfer: Yes  Covid Evaluation: Confirmed COVID Negative  Diagnosis: Right foot infection [[id number]]  Admitting Physician: Orma Flaming [[id number]]  Attending Physician: Orma Flaming [[id number]]  Estimated length of stay: past midnight tomorrow  Certification:: I certify this patient  will need inpatient services for at least 2 midnights            inpatient     I Expect 2 midnight stay secondary to severity of patient's current illness need for inpatient interventions justified by the following:     Severe lab/radiological/exam abnormalities including:    Cellulitis and right foot ulcer    That are currently affecting medical management.   I expect  patient to be hospitalized for 2 midnights requiring inpatient medical care.  Patient is at high risk for adverse outcome (such as loss of life or disability) if not treated.  Indication for inpatient stay as follows:    severe pain requiring acute inpatient management,    Need for operative/procedural  intervention    Need for IV antibiotics, IV fluids,      Level of care     tele  For 12H 24H     medical floor       SDU tele indefinitely please discontinue once patient no longer qualifies COVID-19 Labs    Lab Results  Component Value Date   [place] NEGATIVE 10/29/2020     Precautions: admitted as  Covid Negative    PPE: Used by the provider:   N95 eye Goggles,  Gloves   Derrika Ruffalo 10/29/2020, 1:01 AM    [facility]     after 2 AM please page floor coverage PA If 7AM-7PM, please contact the day team taking care of the patient using [email or web address]   Patient was evaluated in the context of the global COVID-19 pandemic, which necessitated consideration that the patient might be at risk for infection with the SARS-CoV-2 virus that causes COVID-19. Institutional protocols and algorithms that pertain to the evaluation of  patients at risk for COVID-19 are in a state of rapid change based on information released by regulatory bodies including the CDC and federal and state organizations. These policies and algorithms were followed during the patient's care.

## 2020-10-29 NOTE — Consult Note (Addendum)
Chief Complaint: Chronic nonhealing right foot ulceration/wound History: HPI    Patient presents with wound to the right foot.  This started about a month ago, she required 2-week hospitalization for IV antibiotics.  She was discharged and started on a course of doxycycline and Augmentin, states that the erythema has been continuing since then.  She is also having purulent drainage which has been worsening despite outpatient antibiotics.  She denies any fevers at home, no history of diabetes or poor circulatory history.  She is taking Aleve for pain, this is helped.  Review of systems: No recent fevers, chills, nausea, emesis.  No recent loss of consciousness, syncopal episode, or headaches   Past Medical History:  Diagnosis Date   Allergy    Anxiety    Arthritis    Blood transfusion without reported diagnosis    as baby   Breast cancer ([place]) 1999-2000   s/p lumpectomy and radiation, no chemo-right   History of chicken pox    History of hip replacement    Hot flashes    treated wtih paxil   Hyperlipidemia    Osteoporosis    DXA 11/12/17   UTI (urinary tract infection)     Allergies  Allergen Reactions   Codeine Nausea And Vomiting    No current facility-administered medications on file prior to encounter.   Current Outpatient Medications on File Prior to Encounter  Medication Sig Dispense Refill   acetaminophen (TYLENOL) 500 MG tablet Take 500 mg by mouth daily as needed (pain).     acidophilus (RISAQUAD) CAPS capsule Take 1 capsule by mouth daily. 14 capsule 0   B Complex Vitamins (VITAMIN B COMPLEX) TABS Take 1 tablet by mouth every morning.     Calcium Carbonate-Vitamin D (CALCIUM-D PO) Take 1 tablet by mouth 2 (two) times daily.     Cholecalciferol (VITAMIN D-3 PO) Take 1 tablet by mouth every morning.     metoprolol tartrate (LOPRESSOR) 25 MG tablet Take 0.5 tablets (12.5 mg total) by mouth 2 (two) times daily. (Patient taking differently: Take 25 mg by mouth 2  (two) times daily.) 30 tablet 1   PARoxetine (PAXIL-CR) 25 MG 24 hr tablet Take 25 mg by mouth every morning.     amoxicillin-clavulanate (AUGMENTIN) 875-125 MG tablet Take 1 tablet by mouth every 12 (twelve) hours for 10 days. (Patient not taking: No sig reported) 20 tablet 0   doxycycline (VIBRA-TABS) 100 MG tablet Take 1 tablet (100 mg total) by mouth every 12 (twelve) hours for 10 days. (Patient not taking: No sig reported) 20 tablet 0   hydrOXYzine (ATARAX/VISTARIL) 25 MG tablet Take 1 tablet (25 mg total) by mouth 3 (three) times daily as needed for itching. (Patient not taking: No sig reported) 30 tablet 0    Physical Exam: Vitals:   10/29/20 2000 10/29/20 2049  BP: (!) 109/58 (!) 141/108  Pulse: 80 81  Resp: 18 14  Temp: 98.5 F (36.9 C) 99.3 F (37.4 C)  SpO2: 95% 99%   Body mass index is 25.33 kg/m. Alert and oriented x3 No shortness of breath or chest pain. Abdomen soft and nontender.  No incontinence of bowel or bladder Compartments of the lower extremity are soft and nontender.  Positive palpable pulses in the left lower extremity.  Positive dorsalis pedis pulse in the right lower extremity unable to palpate the posterior tibial pulse. Patient is able to feel light touch in the right lower extremity to the level of the toes. Large anterior  lateral soft tissue loss with significant eschar formation.  No purulent odor but there is drainage.  No dressing at time of evaluation. No pain with passive range of motion at the hip, knee, ankle.  Image: US Venous Img Lower Unilateral Right  Result Date: 10/07/2020 CLINICAL DATA:  Right lower extremity pain and edema. History of breast cancer. Evaluate for DVT. EXAM: RIGHT LOWER EXTREMITY VENOUS DOPPLER ULTRASOUND TECHNIQUE: Gray-scale sonography with graded compression, as well as color Doppler and duplex ultrasound were performed to evaluate the lower extremity deep venous systems from the level of the common femoral vein and  including the common femoral, femoral, profunda femoral, popliteal and calf veins including the posterior tibial, peroneal and gastrocnemius veins when visible. The superficial great saphenous vein was also interrogated. Spectral Doppler was utilized to evaluate flow at rest and with distal augmentation maneuvers in the common femoral, femoral and popliteal veins. COMPARISON:  None. FINDINGS: Contralateral Common Femoral Vein: Respiratory phasicity is normal and symmetric with the symptomatic side. No evidence of thrombus. Normal compressibility. Common Femoral Vein: No evidence of thrombus. Normal compressibility, respiratory phasicity and response to augmentation. Saphenofemoral Junction: No evidence of thrombus. Normal compressibility and flow on color Doppler imaging. Profunda Femoral Vein: No evidence of thrombus. Normal compressibility and flow on color Doppler imaging. Femoral Vein: No evidence of thrombus. Normal compressibility, respiratory phasicity and response to augmentation. Popliteal Vein: No evidence of thrombus. Normal compressibility, respiratory phasicity and response to augmentation. Calf Veins: No evidence of thrombus. Normal compressibility and flow on color Doppler imaging. Superficial Great Saphenous Vein: No evidence of thrombus. Normal compressibility. Venous Reflux:  None. Other Findings:  None. IMPRESSION: No evidence of DVT within the right lower extremity. Electronically Signed   By: Sandi Mariscal M.D.   On: 10/07/2020 09:33   DG Chest Port 1 View  Result Date: 10/08/2020 CLINICAL DATA:  Questionable sepsis - evaluate for abnormality EXAM: PORTABLE CHEST 1 VIEW COMPARISON:  November 01, 2006 FINDINGS: Low lung volume film which limits evaluation. Mildly enlarged cardiomediastinal silhouette with irregular margins of the RIGHT perihilar border. Elevation of the RIGHT hemidiaphragm. No pleural effusion. No pneumothorax. Minimal LEFT retrocardiac opacity, likely atelectasis. RIGHT  basilar linear opacities, likely atelectasis. Visualized abdomen is unremarkable. Status post LEFT shoulder arthroplasty. IMPRESSION: 1. Low lung volume film limits evaluation. There is irregular contour of the RIGHT perihilar border. This could be due to inspiratory effort. Recommend follow-up PA and lateral chest radiograph versus chest CT for improved evaluation. 2. Bibasilar linear opacities, likely atelectasis. Differential considerations include infection. Electronically Signed   By: Valentino Saxon M.D.   On: 10/08/2020 14:24   DG Foot Complete Right  Result Date: 10/29/2020 CLINICAL DATA:  Right foot wound for 1 month. EXAM: RIGHT FOOT COMPLETE - 3+ VIEW COMPARISON:  Right foot x-ray 10/07/2020. FINDINGS: There is soft tissue swelling of the entire foot. This is most significant along the dorsal aspect of the foot and ankle where there is soft tissue gas. There is no radiopaque foreign body. There is no acute fracture or dislocation. There is no periosteal reaction or cortical erosion identified. The joint spaces are maintained. IMPRESSION: 1. Soft tissue swelling and air overlying the dorsal aspect of the foot and ankle worrisome for infection. Abscess or necrotizing infection cannot be excluded. 2. No acute bony abnormality. Electronically Signed   By: Ronney Asters M.D.   On: 10/29/2020 15:21   DG Foot Complete Right  Result Date: 10/07/2020 CLINICAL DATA:  80 year old female with pain  and swelling in the right foot but no known injury. EXAM: RIGHT FOOT COMPLETE - 3+ VIEW COMPARISON:  None. FINDINGS: Three portable views. Bone mineralization is within normal limits. Calcaneus intact. Tarsal bones appear intact and normally aligned. Distal joint spaces appear normal for age. No acute osseous abnormality identified. Dorsal soft tissue swelling. No soft tissue gas. No radiopaque foreign body identified. IMPRESSION: Soft tissue swelling with no acute osseous abnormality identified. Electronically  Signed   By: Genevie Ann M.D.   On: 10/07/2020 09:15   ECHOCARDIOGRAM COMPLETE  Result Date: 10/16/2020    ECHOCARDIOGRAM REPORT   Patient Name:   [name] Price Date of Exam: 10/16/2020 Medical Rec #:  [id number]         Height:       63.0 in Accession #:    [id number]        Weight:       155.4 lb Date of Birth:  October 24, 1940         BSA:          1.737 m Patient Age:    43 years          BP:           115/65 mmHg Patient Gender: F                 HR:           93 bpm. Exam Location:  Inpatient Procedure: 2D Echo, Cardiac Doppler and Color Doppler Indications:    Other abnormalities of the heart                 PSVT  History:        Patient has no prior history of Echocardiogram examinations.                 Arrythmias:PSVT; Risk Factors:Dyslipidemia.  Sonographer:    Wenda Low Referring Phys: [place]  1. Left ventricular ejection fraction, by estimation, is 60 to 65%. The left ventricle has normal function. The left ventricle has no regional wall motion abnormalities. Left ventricular diastolic parameters were normal.  2. Right ventricular systolic function is normal. The right ventricular size is normal. There is normal pulmonary artery systolic pressure.  3. The mitral valve is normal in structure. Mild mitral valve regurgitation. No evidence of mitral stenosis.  4. Tricuspid valve regurgitation is mild to moderate.  5. The aortic valve is normal in structure. Aortic valve regurgitation is trivial. No aortic stenosis is present.  6. The inferior vena cava is normal in size with greater than 50% respiratory variability, suggesting right atrial pressure of 3 mmHg. FINDINGS  Left Ventricle: Left ventricular ejection fraction, by estimation, is 60 to 65%. The left ventricle has normal function. The left ventricle has no regional wall motion abnormalities. The left ventricular internal cavity size was normal in size. There is  no left ventricular hypertrophy. Left ventricular diastolic  parameters were normal. Right Ventricle: The right ventricular size is normal. No increase in right ventricular wall thickness. Right ventricular systolic function is normal. There is normal pulmonary artery systolic pressure. The tricuspid regurgitant velocity is 2.76 m/s, and  with an assumed right atrial pressure of 3 mmHg, the estimated right ventricular systolic pressure is [id number] mmHg. Left Atrium: Left atrial size was normal in size. Right Atrium: Right atrial size was normal in size. Pericardium: There is no evidence of pericardial effusion. Mitral Valve: The mitral valve is normal in structure. Mild mitral  valve regurgitation, with centrally-directed jet. No evidence of mitral valve stenosis. MV peak gradient, 4.8 mmHg. The mean mitral valve gradient is 2.0 mmHg. Tricuspid Valve: The tricuspid valve is normal in structure. Tricuspid valve regurgitation is mild to moderate. No evidence of tricuspid stenosis. Aortic Valve: The aortic valve is normal in structure. Aortic valve regurgitation is trivial. Aortic regurgitation PHT measures 438 msec. No aortic stenosis is present. Aortic valve mean gradient measures 5.0 mmHg. Aortic valve peak gradient measures 9.9  mmHg. Aortic valve area, by VTI measures 2.37 cm. Pulmonic Valve: The pulmonic valve was normal in structure. Pulmonic valve regurgitation is not visualized. No evidence of pulmonic stenosis. Aorta: The aortic root is normal in size and structure. Venous: The inferior vena cava is normal in size with greater than 50% respiratory variability, suggesting right atrial pressure of 3 mmHg. IAS/Shunts: No atrial level shunt detected by color flow Doppler.  LEFT VENTRICLE PLAX 2D LVIDd:         4.80 cm     Diastology LVIDs:         3.00 cm     LV e' medial:    8.49 cm/s LV PW:         0.90 cm     LV E/e' medial:  11.4 LV IVS:        1.00 cm     LV e' lateral:   9.68 cm/s LVOT diam:     2.00 cm     LV E/e' lateral: 10.0 LV SV:         74 LV SV Index:   43 LVOT  Area:     3.14 cm  LV Volumes (MOD) LV vol d, MOD A2C: 70.3 ml LV vol d, MOD A4C: 69.7 ml LV vol s, MOD A2C: 29.6 ml LV vol s, MOD A4C: 26.5 ml LV SV MOD A2C:     40.7 ml LV SV MOD A4C:     69.7 ml LV SV MOD BP:      43.4 ml RIGHT VENTRICLE RV Basal diam:  3.00 cm RV Mid diam:    2.70 cm RV S prime:     15.60 cm/s TAPSE (M-mode): 2.4 cm LEFT ATRIUM             Index       RIGHT ATRIUM           Index LA diam:        3.70 cm 2.13 cm/m  RA Area:     11.90 cm LA Vol (A2C):   52.1 ml 29.99 ml/m RA Volume:   23.40 ml  13.47 ml/m LA Vol (A4C):   41.6 ml 23.95 ml/m LA Biplane Vol: 47.1 ml 27.11 ml/m  AORTIC VALVE AV Area (Vmax):    2.36 cm AV Area (Vmean):   2.28 cm AV Area (VTI):     2.37 cm AV Vmax:           157.00 cm/s AV Vmean:          106.000 cm/s AV VTI:            0.314 m AV Peak Grad:      9.9 mmHg AV Mean Grad:      5.0 mmHg LVOT Vmax:         118.00 cm/s LVOT Vmean:        76.900 cm/s LVOT VTI:          0.237 m LVOT/AV VTI ratio: 0.75 AI PHT:  438 msec  AORTA Ao Root diam: 3.20 cm MITRAL VALVE               TRICUSPID VALVE MV Area (PHT): 4.04 cm    TR Peak grad:   30.5 mmHg MV Area VTI:   2.36 cm    TR Vmax:        276.00 cm/s MV Peak grad:  4.8 mmHg MV Mean grad:  2.0 mmHg    SHUNTS MV Vmax:       1.10 m/s    Systemic VTI:  0.24 m MV Vmean:      67.6 cm/s   Systemic Diam: 2.00 cm MV Decel Time: 188 msec MV E velocity: 96.80 cm/s MV A velocity: 96.00 cm/s MV E/A ratio:  1.01 Mihai Croitoru MD Electronically signed by Sanda Klein MD Signature Date/Time: 10/16/2020/4:27:00 PM    Final    CT EXTREMITY LOWER RIGHT WO CONTRAST  Result Date: 10/08/2020 CLINICAL DATA:  Right foot infection. Evaluate for gas-forming organism. EXAM: CT OF THE LOWER RIGHT EXTREMITY WITHOUT CONTRAST TECHNIQUE: Multidetector CT imaging of the right lower extremity was performed according to the standard protocol. COMPARISON:  Right foot x-rays from yesterday. FINDINGS: Bones/Joint/Cartilage No bony destruction or  periosteal reaction. No fracture or dislocation. Joint spaces are preserved. No joint effusion. Ligaments Ligaments are suboptimally evaluated by CT. Muscles and Tendons Grossly intact. Soft tissue Scattered soft tissue swelling of the lower leg. Severe soft tissue swelling of the ankle and foot with several skin blisters noted. No subcutaneous emphysema. No fluid collection or hematoma. No soft tissue mass. IMPRESSION: 1. Severe soft tissue swelling of the ankle and foot with several skin blisters noted. No subcutaneous emphysema or discrete abscess. 2. No acute osseous abnormality. Electronically Signed   By: Titus Dubin M.D.   On: 10/08/2020 15:24    A/P: [name] Price is a pleasant 80 year old woman with a chronic nonhealing right foot ulcer over the anterior and lateral aspect of the foot and ankle region.  Patient has been treated as an outpatient with antibiotics and dressing changes.  Because of persistent issues she presented to the med center [facility] and orthopedic consultation was requested.  Patient was transferred to [facility] for ongoing inpatient care.  Plan: 1.  Medical team requested consultation for surgical debridement.  However after examining the wound it is unclear whether the debridement would be sufficient.  Given the extent of the soft tissue compromise the resulting debridement would leave exposed bone which could further compromise healing. 2.  Recommend formal ABI testing to determine if a more extensive soft tissue procedure would be warranted.  Patient may require free flap soft tissue reconstruction after debridement. 3.  Recommend MRI with and without contrast to determine the extent of the disease tissue. 4.  Recommend formal Infectious Disease Consult to assist with multidisciplinary medical management of this patient  Will review findings with my partner Dr. Doran Durand (foot and ankle specialist) to determine if limb salvage is a potential treatment option.  If so more  than likely she will also need plastic surgery for soft tissue reconstruction.  Patient may also require revascularization and therefore consultation with the vascular surgery service.  This ultimately may require transfer to [facility].  Unfortunately currently there are no beds available at [facility] and so she was transferred to [facility] so that appropriate work-up could be initiated.  DR Cleveland

## 2020-10-29 NOTE — ED Notes (Signed)
Snack and gingerale and coffee given.

## 2020-10-29 NOTE — Progress Notes (Signed)
Received a phone call from Facility: [facility]  Requesting MD: Dr. Dina Rich Patient with h/o recent hospitalization from 8/27-10/19/20 for right foot cellulitis with hemorrhagic blister and early sepsis presenting with worsening right foot infection that shows air/infection. She is not septic at this time.  Dr. Rolena Infante with [facility] consulted, likely needs amputation. On vanc/cefipime. Wants her at [facility] and would like to be called on her arrival. Needs MRI of foot.  Plan of care: continue IV abx, call ortho, MRI foot.   The patient will be accepted for admission to telemetry at [facility] when bed is available.    Nursing staff, Please call the [facility] number at the top of Amion at the time of the patient's arrival so that the patient can be paged to the admitting physician.   Casimer Bilis, M.D. [facility]

## 2020-10-29 NOTE — Progress Notes (Signed)
Pharmacy Antibiotic Note  [name] Price is a 80 y.o. female admitted on 10/29/2020 with  non-healing wound of RLE .  Pharmacy has been consulted for Cefepime & Vancomycin dosing.  Plan: Cefepime 2gm IV q12h Vancomycin '750mg'$  IV q24h to target AUC 400-550 Check Vancomycin levels at steady state Monitor renal function and cx data   Height: '5\' 3"'$  (160 cm) Weight: 64.9 kg (143 lb) IBW/kg (Calculated) : 52.4  Temp (24hrs), Avg:98.9 F (37.2 C), Min:98.5 F (36.9 C), Max:99.3 F (37.4 C)  Recent Labs  Lab 10/29/20 1441 10/29/20 1637  WBC 7.3  --   CREATININE 0.56  --   LATICACIDVEN 1.6 0.9    Estimated Creatinine Clearance: 53.4 mL/min (by C-G formula based on SCr of 0.56 mg/dL).    Allergies  Allergen Reactions   Codeine Nausea And Vomiting    Antimicrobials this admission: 9/17 Cefepime >>  9/17 Vancomycin >>   Dose adjustments this admission:  Microbiology 9/17 Blood Cx:  Thank you for allowing pharmacy to be a part of this patient's care.  Netta Cedars PharmD 10/29/2020 9:48 PM

## 2020-10-29 NOTE — ED Triage Notes (Signed)
Pt arrive POV from home for an evaluation of known Right foot cellulitis. Pt reports she has been home for 2 weeks post a 2 week hospitalization for cellulitis. Pt took her last abx this morning. Pt states she is being followed by a home health nurse that sent her stating she has "dead skin" that needs to be removed. Pt leg wrapped in triage. Edema and weeping noted. Pt requested to keep new dressing on leg. Pt denies n/v/d NAD.

## 2020-10-30 ENCOUNTER — Inpatient Hospital Stay (HOSPITAL_COMMUNITY): Payer: Medicare Other

## 2020-10-30 DIAGNOSIS — L089 Local infection of the skin and subcutaneous tissue, unspecified: Secondary | ICD-10-CM | POA: Diagnosis not present

## 2020-10-30 DIAGNOSIS — I10 Essential (primary) hypertension: Secondary | ICD-10-CM | POA: Diagnosis not present

## 2020-10-30 DIAGNOSIS — S91301A Unspecified open wound, right foot, initial encounter: Secondary | ICD-10-CM | POA: Diagnosis not present

## 2020-10-30 LAB — PREALBUMIN: Prealbumin: 8.7 mg/dL — ABNORMAL LOW (ref 18–38)

## 2020-10-30 LAB — CBC WITH DIFFERENTIAL/PLATELET
Abs Immature Granulocytes: 0.01 10*3/uL (ref 0.00–0.07)
Basophils Absolute: 0 10*3/uL (ref 0.0–0.1)
Basophils Relative: 1 %
Eosinophils Absolute: 0.2 10*3/uL (ref 0.0–0.5)
Eosinophils Relative: 4 %
HCT: 23.4 % — ABNORMAL LOW (ref 36.0–46.0)
Hemoglobin: 7.6 g/dL — ABNORMAL LOW (ref 12.0–15.0)
Immature Granulocytes: 0 %
Lymphocytes Relative: 23 %
Lymphs Abs: 1.1 10*3/uL (ref 0.7–4.0)
MCH: 31.4 pg (ref 26.0–34.0)
MCHC: 32.5 g/dL (ref 30.0–36.0)
MCV: 96.7 fL (ref 80.0–100.0)
Monocytes Absolute: 0.6 10*3/uL (ref 0.1–1.0)
Monocytes Relative: 13 %
Neutro Abs: 2.9 10*3/uL (ref 1.7–7.7)
Neutrophils Relative %: 59 %
Platelets: 306 10*3/uL (ref 150–400)
RBC: 2.42 MIL/uL — ABNORMAL LOW (ref 3.87–5.11)
RDW: 14.7 % (ref 11.5–15.5)
WBC: 4.8 10*3/uL (ref 4.0–10.5)
nRBC: 0 % (ref 0.0–0.2)

## 2020-10-30 LAB — COMPREHENSIVE METABOLIC PANEL
ALT: 9 U/L (ref 0–44)
AST: 12 U/L — ABNORMAL LOW (ref 15–41)
Albumin: 2.2 g/dL — ABNORMAL LOW (ref 3.5–5.0)
Alkaline Phosphatase: 45 U/L (ref 38–126)
Anion gap: 6 (ref 5–15)
BUN: 13 mg/dL (ref 8–23)
CO2: 27 mmol/L (ref 22–32)
Calcium: 8 mg/dL — ABNORMAL LOW (ref 8.9–10.3)
Chloride: 106 mmol/L (ref 98–111)
Creatinine, Ser: 0.5 mg/dL (ref 0.44–1.00)
GFR, Estimated: >60 mL/min (ref >60)
Glucose, Bld: 106 mg/dL — ABNORMAL HIGH (ref 70–99)
Potassium: 4.5 mmol/L (ref 3.5–5.1)
Sodium: 139 mmol/L (ref 135–145)
Total Bilirubin: 0.6 mg/dL (ref 0.3–1.2)
Total Protein: 5.2 g/dL — ABNORMAL LOW (ref 6.5–8.1)

## 2020-10-30 LAB — RETICULOCYTES
Immature Retic Fract: 26.3 % — ABNORMAL HIGH (ref 2.3–15.9)
RBC.: 2.43 MIL/uL — ABNORMAL LOW (ref 3.87–5.11)
Retic Count, Absolute: 135.6 10*3/uL (ref 19.0–186.0)
Retic Ct Pct: 5.6 % — ABNORMAL HIGH (ref 0.4–3.1)

## 2020-10-30 LAB — MAGNESIUM: Magnesium: 1.8 mg/dL (ref 1.7–2.4)

## 2020-10-30 LAB — IRON AND TIBC
Iron: 18 ug/dL — ABNORMAL LOW (ref 28–170)
Saturation Ratios: 8 % — ABNORMAL LOW (ref 10.4–31.8)
TIBC: 223 ug/dL — ABNORMAL LOW (ref 250–450)
UIBC: 205 ug/dL

## 2020-10-30 LAB — HEMOGLOBIN A1C
Hgb A1c MFr Bld: 4.6 % — ABNORMAL LOW (ref 4.8–5.6)
Mean Plasma Glucose: 85.32 mg/dL

## 2020-10-30 LAB — PHOSPHORUS: Phosphorus: 3.2 mg/dL (ref 2.5–4.6)

## 2020-10-30 LAB — FERRITIN: Ferritin: 173 ng/mL (ref 11–307)

## 2020-10-30 LAB — TSH: TSH: 1.147 u[IU]/mL (ref 0.350–4.500)

## 2020-10-30 LAB — VITAMIN B12: Vitamin B-12: 481 pg/mL (ref 180–914)

## 2020-10-30 LAB — FOLATE: Folate: 16.5 ng/mL (ref >5.9)

## 2020-10-30 MED ORDER — GADOBUTROL 1 MMOL/ML IV SOLN
6.5000 mL | Freq: Once | INTRAVENOUS | Status: AC | PRN
  Administered 2020-10-30: 6.5 mL via INTRAVENOUS

## 2020-10-30 MED ORDER — METOPROLOL TARTRATE 25 MG PO TABS
12.5000 mg | ORAL_TABLET | Freq: Two times a day (BID) | ORAL | Status: DC
  Administered 2020-10-30 – 2020-11-08 (×15): 12.5 mg via ORAL
  Filled 2020-10-30 (×19): qty 1

## 2020-10-30 MED ORDER — VANCOMYCIN HCL IN DEXTROSE 1-5 GM/200ML-% IV SOLN
1000.0000 mg | INTRAVENOUS | Status: DC
  Administered 2020-10-31: 1000 mg via INTRAVENOUS
  Filled 2020-10-30: qty 200

## 2020-10-30 MED ORDER — PAROXETINE HCL ER 25 MG PO TB24
25.0000 mg | ORAL_TABLET | Freq: Every morning | ORAL | Status: DC
  Administered 2020-10-31 – 2020-11-10 (×11): 25 mg via ORAL
  Filled 2020-10-30 (×11): qty 1

## 2020-10-30 MED ORDER — FERROUS GLUCONATE 324 (38 FE) MG PO TABS
324.0000 mg | ORAL_TABLET | Freq: Two times a day (BID) | ORAL | Status: DC
  Administered 2020-10-30 – 2020-11-10 (×21): 324 mg via ORAL
  Filled 2020-10-30 (×23): qty 1

## 2020-10-30 NOTE — Progress Notes (Signed)
PROGRESS NOTE  [name] Price [id number] DOB: 1940/08/21 DOA: 10/29/2020 PCP: Tonia Ghent, MD   LOS: 1 day   Brief Narrative / Interim history: 80 year old female with history of depression, hypertension, but otherwise fairly healthy who comes into the hospital with persistent right ankle cellulitis.  She was recently hospitalized 8/27-9/7 for right ankle cellulitis.  She came in with a lesion in the end of August that appeared suddenly, patient does not recall any injury/bites to that ankle.  During the hospital stay there was concern for poison ivy due to a blister and was briefly on steroids as well along with intravenous antibiotics.  Orthopedic surgery was consulted and the blister was lanced with what appeared to be 60 to 80 cc of clear, noninfectious, nonhemorrhagic fluid drained.  She was transitioned to oral antibiotics and eventually discharged home on 9/7.  She has been on antibiotics since but continued to have purulent discharge as well as redness, and increasing her ulceration.  She came back to the hospital and was admitted again 9/17  Subjective / 24h Interval events: Complains of pain in the right ankle, denies any fever or chills, no nausea or vomiting  Assessment & Plan: Principal Problem Right foot wound, right foot cellulitis, sepsis ruled out-orthopedic surgery consulted, appreciate input.  She underwent an MRI of the ankle today which was negative for deep processes, recommending ID consult which we will obtain today. -Dr. Doran Durand, foot and ankle specialist to see tomorrow  Active Problems Iron deficiency anemia-start iron supplementations.  Hemoglobin worse likely in the setting of worsening wound/chronic inflammation  PSVT-during her prior hospital stay occasional episodes of SVT were noted in telemetry, asymptomatic, started on beta-blockers.  Continue.  2D echo at that time was unremarkable.  GERD-continue PPI  Scheduled Meds:  pantoprazole (PROTONIX) IV   40 mg Intravenous Q12H   Continuous Infusions:  ceFEPime (MAXIPIME) IV 2 g (10/30/20 0408)   [START ON 10/31/2020] vancomycin     PRN Meds:.acetaminophen **OR** acetaminophen, fentaNYL (SUBLIMAZE) injection  Diet Orders (From admission, onward)     Start     Ordered   10/30/20 0746  Diet regular Room service appropriate? Yes; Fluid consistency: Thin  Diet effective now       Question Answer Comment  Room service appropriate? Yes   Fluid consistency: Thin      10/30/20 0745            DVT prophylaxis: SCDs Start: 10/29/20 2124     Code Status: Full Code  Family Communication: No family at bedside  Status is: Inpatient  Remains inpatient appropriate because:Inpatient level of care appropriate due to severity of illness  Dispo: The patient is from: Home              Anticipated d/c is to: Home              Patient currently is not medically stable to d/c.   Difficult to place patient No   Level of care: Telemetry  Consultants:  ID Orthopedic surgery   Procedures:  none  Microbiology  Blood cultures 9/17-no growth to date  Antimicrobials: Vancomycin 9/17 >> Cefepime 9/17 >>   Objective: Vitals:   10/29/20 1900 10/29/20 1915 10/29/20 2000 10/29/20 2049  BP: (!) 108/58  (!) 109/58 (!) 141/108  Pulse:  77 80 81  Resp:   18 14  Temp:   98.5 F (36.9 C) 99.3 F (37.4 C)  TempSrc:   Oral Oral  SpO2:  95% 95%  99%  Weight:      Height:        Intake/Output Summary (Last 24 hours) at 10/30/2020 1103 Last data filed at 10/30/2020 0900 Gross per 24 hour  Intake 1005.98 ml  Output 900 ml  Net 105.98 ml   Filed Weights   10/29/20 1318  Weight: 64.9 kg    Examination:  Constitutional: NAD Eyes: no scleral icterus ENMT: Mucous membranes are moist.  Neck: normal, supple Respiratory: clear to auscultation bilaterally, no wheezing, no crackles. Normal respiratory effort. No accessory muscle use.  Cardiovascular: Regular rate and rhythm, no murmurs /  rubs / gallops. No LE edema. Good peripheral pulses Abdomen: non distended, no tenderness. Bowel sounds positive.  Musculoskeletal: no clubbing / cyanosis.  Skin: Large necrotic area right lower extremity surrounded by cellulitis as below Neurologic: CN 2-12 grossly intact. Strength 5/5 in all 4.  Psychiatric: Normal judgment and insight. Alert and oriented x 3. Normal mood.        Data Reviewed: I have independently reviewed following labs and imaging studies   CBC: Recent Labs  Lab 10/29/20 1441 10/29/20 2149 10/30/20 0627  WBC 7.3 6.7 4.8  NEUTROABS 5.8  --  2.9  HGB 8.5* 8.4* 7.6*  HCT 26.5* 25.8* 23.4*  MCV 95.7 96.3 96.7  PLT 330 325 [id number]   Basic Metabolic Panel: Recent Labs  Lab 10/29/20 1441 10/29/20 2149 10/30/20 0627  NA 137  --  139  K 4.0  --  4.5  CL 102  --  106  CO2 26  --  27  GLUCOSE 108*  --  106*  BUN 17  --  13  CREATININE 0.56  --  0.50  CALCIUM 8.7*  --  8.0*  MG  --  1.9 1.8  PHOS  --  3.3 3.2   Liver Function Tests: Recent Labs  Lab 10/29/20 1441 10/30/20 0627  AST 12* 12*  ALT 7 9  ALKPHOS 53 45  BILITOT 0.5 0.6  PROT 5.9* 5.2*  ALBUMIN 3.0* 2.2*   Coagulation Profile: No results for input(s): INR, PROTIME in the last 168 hours. HbA1C: Recent Labs    10/29/20 2149  HGBA1C 4.6*   CBG: No results for input(s): GLUCAP in the last 168 hours.  Recent Results (from the past 240 hour(s))  Blood culture (routine x 2)     Status: None (Preliminary result)   Collection Time: 10/29/20  2:30 PM   Specimen: BLOOD  Result Value Ref Range Status   Specimen Description   Final    BLOOD RIGHT ANTECUBITAL Performed at [facility], [address] [place], [place] [address]    Special Requests   Final    BOTTLES DRAWN AEROBIC AND ANAEROBIC Blood Culture results may not be optimal due to an inadequate volume of blood received in culture bottles Performed at [facility], [address],  [place], [place] [address]    Culture   Final    NO GROWTH < 24 HOURS Performed at [facility], [place] [address] [place], [place] [address]    Report Status PENDING  Incomplete  Resp Panel by RT-PCR (Flu A&B, Covid) Nasopharyngeal Swab     Status: None   Collection Time: 10/29/20  2:32 PM   Specimen: Nasopharyngeal Swab; Nasopharyngeal(NP) swabs in vial transport medium  Result Value Ref Range Status   SARS Coronavirus 2 by RT PCR NEGATIVE NEGATIVE Final    Comment: (NOTE) SARS-CoV-2 target nucleic acids are NOT DETECTED.  The SARS-CoV-2 RNA is generally detectable in upper respiratory specimens during the acute phase of infection. The lowest concentration of SARS-CoV-2 viral copies this assay can detect is 138 copies/mL. A negative result does not preclude SARS-Cov-2 infection and should not be used as the sole basis for treatment or other patient management decisions. A negative result may occur with  improper specimen collection/handling, submission of specimen other than nasopharyngeal swab, presence of viral mutation(s) within the areas targeted by this assay, and inadequate number of viral copies(<138 copies/mL). A negative result must be combined with clinical observations, patient history, and epidemiological information. The expected result is Negative.  Fact Sheet for Patients:  EntrepreneurPulse.com.au  Fact Sheet for Healthcare Providers:  IncredibleEmployment.be  This test is no t yet approved or cleared by the [place] FDA and  has been authorized for detection and/or diagnosis of SARS-CoV-2 by FDA under an Emergency Use Authorization (EUA). This EUA will remain  in effect (meaning this test can be used) for the duration of the COVID-19 declaration under Section 564(b)(1) of the Act, 21 U.S.C.section 360bbb-3(b)(1), unless the authorization is terminated  or revoked sooner.       Influenza A by PCR NEGATIVE NEGATIVE  Final   Influenza B by PCR NEGATIVE NEGATIVE Final    Comment: (NOTE) The Xpert Xpress SARS-CoV-2/FLU/RSV plus assay is intended as an aid in the diagnosis of influenza from Nasopharyngeal swab specimens and should not be used as a sole basis for treatment. Nasal washings and aspirates are unacceptable for Xpert Xpress SARS-CoV-2/FLU/RSV testing.  Fact Sheet for Patients: EntrepreneurPulse.com.au  Fact Sheet for Healthcare Providers: IncredibleEmployment.be  This test is not yet approved or cleared by the [place] FDA and has been authorized for detection and/or diagnosis of SARS-CoV-2 by FDA under an Emergency Use Authorization (EUA). This EUA will remain in effect (meaning this test can be used) for the duration of the COVID-19 declaration under Section 564(b)(1) of the Act, 21 U.S.C. section 360bbb-3(b)(1), unless the authorization is terminated or revoked.  Performed at [facility], [address] [place], [place] [address]   Blood culture (routine x 2)     Status: None (Preliminary result)   Collection Time: 10/29/20  2:35 PM   Specimen: BLOOD  Result Value Ref Range Status   Specimen Description   Final    BLOOD LEFT ANTECUBITAL Performed at [facility], [address] [place], [place] [address]    Special Requests   Final    BOTTLES DRAWN AEROBIC AND ANAEROBIC Blood Culture results may not be optimal due to an inadequate volume of blood received in culture bottles Performed at [facility], [address] [place], [place] [address]    Culture   Final    NO GROWTH < 24 HOURS Performed at [facility], [place] [address] [place], [place] [address]    Report Status PENDING  Incomplete     Radiology Studies: MR FOOT RIGHT W WO CONTRAST  Result Date: 10/30/2020 CLINICAL DATA:  Chronic nonhealing right foot ulcer anterolaterally along the foot and ankle. EXAM:  MRI OF THE RIGHT HINDFOOT WITHOUT AND WITH CONTRAST TECHNIQUE: Multiplanar, multisequence MR imaging of the ankle was performed. No intravenous contrast was administered. COMPARISON:  Radiographs 10/29/2020 and CT scan from 10/08/2020 FINDINGS: TENDONS Peroneal: Longitudinal tearing of the peroneus brevis in the vicinity of the lateral malleolus. Posteromedial: Distal tibialis posterior tenosynovitis and tendinopathy, correlate clinically in assessing for tibialis posterior dysfunction. Anterior: Grossly unremarkable Achilles: Unremarkable  Plantar Fascia: Unremarkable LIGAMENTS Lateral: Unremarkable Medial: Unremarkable CARTILAGE Ankle Joint: No tibiotalar joint effusion or specific lesion along the plafond or talar dome. Subtalar Joints/Sinus Tarsi: Effusion of the posterior subtalar joint extending posteriorly and into the sinus tarsi. No substantial marrow edema along the joint to suggest septic arthritis. Bones: No significant degree of marrow edema or enhancement indicate osteomyelitis involving the hindfoot, midfoot, or proximal metatarsals. Other: Very large necrotic ulceration of the anterolateral distal calf, ankle, and dorsal foot. Subtraction images in series 100 demonstrate the large irregular region of nonenhancing tissue in fluid along the margin of the extensive ulceration, with necrotic tissue extending in a flap like manner anterior to the distal tibia for example on image 1 of series 100. The proximal extent of the necrotic ulceration is not completely included, nor is the distal extent, with nonenhancing tissues extending least as far as the MTP joints dorsally, for example on image 14 series 12. Along the margins of the large necrotic region there is a rind of enhancement favoring granulation tissue. IMPRESSION: 1. Very large necrotic ulceration involving the lower calf anterolaterally, the lateral ankle, and extending into the lateral dorsal forefoot at least as far as the MTP joints.  Subtraction series 100 depicts this large region of necrosis as nonenhancing tissue bordered by enhancing granulation tissue. 2. No compelling findings of active osteomyelitis involving the hindfoot, midfoot, or proximal metatarsals. I do not see a specific drainable contained abscess separate from the large ulceration. 3. Longitudinal tearing of the peroneus brevis tendon adjacent to the lateral malleolus. 4. Distal tibialis posterior tenosynovitis and tendinopathy. 5. Small effusion of the posterior subtalar joint, without marrow edema along the bony surfaces to further indicate septic arthritis. Electronically Signed   By: Van Clines M.D.   On: 10/30/2020 09:26   DG Foot Complete Right  Result Date: 10/29/2020 CLINICAL DATA:  Right foot wound for 1 month. EXAM: RIGHT FOOT COMPLETE - 3+ VIEW COMPARISON:  Right foot x-ray 10/07/2020. FINDINGS: There is soft tissue swelling of the entire foot. This is most significant along the dorsal aspect of the foot and ankle where there is soft tissue gas. There is no radiopaque foreign body. There is no acute fracture or dislocation. There is no periosteal reaction or cortical erosion identified. The joint spaces are maintained. IMPRESSION: 1. Soft tissue swelling and air overlying the dorsal aspect of the foot and ankle worrisome for infection. Abscess or necrotizing infection cannot be excluded. 2. No acute bony abnormality. Electronically Signed   By: Ronney Asters M.D.   On: 10/29/2020 15:21     Marzetta Board, MD, PhD [facility]  Between 7 am - 7 pm I am available, please contact me via Amion (for emergencies) or Securechat (non urgent messages)  Between 7 pm - 7 am I am not available, please contact night coverage MD/APP via Amion

## 2020-10-30 NOTE — Consult Note (Signed)
[facility]    Date of Admission:  10/29/2020     Reason for Consult: Lower extremity wound     Referring Physician: Dr Renne Crigler  Current antibiotics: Vancomycin 9/17-pres Cefepime 9/17-pres  ASSESSMENT:    80 y.o. female admitted with:  Non-healing necrotic right foot ulcer: Appears could be temporally related to skin breakdown from excoriation shortly before getting a pedicure.  Would consider typical Staph/Strep organisms to be likely causes, but her description of a necrotic appearing blister could be consistent with ecthyma gangrenosum typically associated with Pseudomonas or other gram-negative's not adequately covered by doxycycline or Augmentin since her wound worsened after discharge.  She does not have known immunosuppression typically associated with this type of process though this can occur in immunocompetent hosts.  She was not bacteremic either but this can also appear more localized.  She also has potential exposure from her dogs licking the area which could be related to Capnocytophaga spp, however, this might be less likely since Augmentin would provide coverage.  The abrupt onset and appearance does not seem typical with NTM infection.  Other potential etiologies include fungal.  Would also consider noninfectious causes of a nonhealing ulcer, however, given the abrupt onset that this developed would probably consider this less likely.   RECOMMENDATIONS:    Continue vancomycin and cefepime ABI pending Appreciate orthopedic surgery evaluation ? Utility in a biopsy to aid in diagnosis or exclude potential non-infectious causes Dr Tommy Medal here tomorrow   Active Problems:   Sepsis ([place])   Right foot infection   Anemia   HTN (hypertension)   MEDICATIONS:    Scheduled Meds:  ferrous gluconate  324 mg Oral BID WC   pantoprazole (PROTONIX) IV  40 mg Intravenous Q12H   Continuous Infusions:  ceFEPime (MAXIPIME) IV 2 g (10/30/20 0408)    [START ON 10/31/2020] vancomycin     PRN Meds:.acetaminophen **OR** acetaminophen, fentaNYL (SUBLIMAZE) injection  HPI:    [name] Price is a 80 y.o. female with a past medical history significant for depression, hypertension, bilateral hip replacements, shoulder replacement, breast cancer s/p radiation and lumpectomy (15-20 years ago), and recent hospitalization 8/27 to 10/19/2020 for right ankle cellulitis treated with IV antibiotics and then discharged home on oral antibiotics who presented back to the emergency department yesterday for worsening of her wound.  Patient was in her normal state of health until mid August at which point she developed a pruritic skin rash over various parts of her body.  This led to scratching.  She developed an abrasion around her second or third right toe due to scratching.  She saw her dermatologist who prescribed a topical steroid cream for her rash.  She subsequently had a pedicure shortly afterwards.  She was returning to her baseline, however, she awoke on 10/07/2020 with acute onset right foot swelling and erythema.  She went to [facility] to visit her sister who was having surgery but was unable to see her at that time due to visitor limitations of 1 at a time.  As a result, she decided to go to the ER given the right foot symptoms that developed earlier that day.  She was had leukocytosis and was diagnosed with likely cellulitis.  She started on Keflex and subsequently discharged home.  She developed watery diarrhea and increasing blisters which she reports had a necrotic-like appearance so she presented back to the ED on the following day where she was noted to be tachycardic  with a temperature of 99 F with lactic acidosis.  A CT scan of the foot was obtained at that time which revealed severe soft tissue swelling of the ankle and foot with several skin blisters noted.  There was no subcutaneous gas or abscess.  She was admitted at that time and started  on cefazolin.  She was evaluated by orthopedics and underwent drainage of a large blister (cultures not done).  Apparently there had been some concern for poison ivy exposure and she was placed on steroids for a brief period as well.  However, due to worsening erythema on 8/31 she was broadened to vancomycin and cefepime which was continued through 9/6.  Additionally she was given some Lasix to help with swelling.  Her white blood cell count improved and the swelling went down but she still had redness.  She had a lot of weeping but overall her leg was improved to the point where it was felt she could go home.  She was transitioned to Augmentin and doxycycline at discharge.  Unfortunately, after the erythema continued and she also has been having purulent drainage with a large ulceration develop.  She has not been having any fevers or chills at home.  She does not have any history of diabetes or peripheral arterial disease, however, ABIs have been ordered and are pending.  She was evaluated by orthopedic surgery who recommended MRI.  This showed a large necrotic ulcer, no compelling findings of osteomyelitis or abscess.  She will be evaluated by their foot and ankle specialist tomorrow and is currently on vancomycin and cefepime.    She otherwise reports living in a home with her husband.  She has no other immunocompromising conditions that she is aware of.  No previous issues with ulcers/wounds/rashes.  She has 2 pet dogs at home who sleep in her bed.  They have licked her feet previously and have tried to lick since she has been back home but the bandage prevents this from happening.  No cats.  She does yard work with her husband and wears sandals but does not recall any trauma or injury sustained .  She has been afebrile here with Tmax 99.3, WBC 7.3 at admission, creatinine 0.50, AST 12, ALT 9, ESR 65, CRP 4.7, A1c 4.6.     Past Medical History:  Diagnosis Date   Allergy    Anxiety    Arthritis     Blood transfusion without reported diagnosis    as baby   Breast cancer ([place]) 1999-2000   s/p lumpectomy and radiation, no chemo-right   History of chicken pox    History of hip replacement    Hot flashes    treated wtih paxil   Hyperlipidemia    Osteoporosis    DXA 11/12/17   UTI (urinary tract infection)     Social History   Tobacco Use   Smoking status: Never   Smokeless tobacco: Never  Vaping Use   Vaping Use: Never used  Substance Use Topics   Alcohol use: No    Comment: rare once a year   Drug use: No    Family History  Problem Relation Age of Onset   Lung cancer Mother    Heart disease Mother    Colon cancer Mother        late 19's   Cancer Father    Colon cancer Father 17       died at 62   Breast cancer Maternal Aunt    Breast  cancer Maternal Aunt    Breast cancer Maternal Aunt    Rectal cancer Neg Hx    Stomach cancer Neg Hx    Crohn's disease Neg Hx    Esophageal cancer Neg Hx     Allergies  Allergen Reactions   Codeine Nausea And Vomiting    Review of Systems  Constitutional:  Negative for fever and malaise/fatigue.  Respiratory: Negative.    Cardiovascular: Negative.   Gastrointestinal:  Positive for diarrhea (resolved prior to admission.).  Genitourinary: Negative.   Musculoskeletal:  Positive for joint pain.  Skin:  Positive for itching and rash.       + Right foot ulcer.   Neurological: Negative.   All other systems reviewed and are negative.  OBJECTIVE:   Blood pressure (!) 141/108, pulse 81, temperature 99.3 F (37.4 C), temperature source Oral, resp. rate 14, height 5' 3"  (1.6 m), weight 64.9 kg, SpO2 99 %. Body mass index is 25.33 kg/m.  Physical Exam Constitutional:      General: She is not in acute distress.    Appearance: Normal appearance.  HENT:     Head: Normocephalic and atraumatic.  Eyes:     General: No scleral icterus.    Extraocular Movements: Extraocular movements intact.     Conjunctiva/sclera: Conjunctivae  normal.  Cardiovascular:     Rate and Rhythm: Normal rate and regular rhythm.  Pulmonary:     Effort: Pulmonary effort is normal. No respiratory distress.     Breath sounds: Normal breath sounds.  Abdominal:     General: There is no distension.     Palpations: Abdomen is soft.     Tenderness: There is no abdominal tenderness.  Musculoskeletal:     Cervical back: Normal range of motion and neck supple.     Right lower leg: Edema present.     Left lower leg: No edema.  Skin:    General: Skin is warm and dry.     Findings: Erythema present.     Comments: See pictures below.  Right foot ulcer currently wrapped from this AM.  Toes are warm, well perfused.  Sensation is intact.   Neurological:     General: No focal deficit present.     Mental Status: She is alert and oriented to person, place, and time.  Psychiatric:        Mood and Affect: Mood normal.        Behavior: Behavior normal.    PICTURES OF RIGHT FOOT: 10/07/20       10/09/20:         10/29/20:            Lab Results: Lab Results  Component Value Date   WBC 4.8 10/30/2020   HGB 7.6 (L) 10/30/2020   HCT 23.4 (L) 10/30/2020   MCV 96.7 10/30/2020   PLT 306 10/30/2020    Lab Results  Component Value Date   NA 139 10/30/2020   K 4.5 10/30/2020   CO2 27 10/30/2020   GLUCOSE 106 (H) 10/30/2020   BUN 13 10/30/2020   CREATININE 0.50 10/30/2020   CALCIUM 8.0 (L) 10/30/2020   GFRNONAA >60 10/30/2020   GFRAA  11/01/2006    >60        The eGFR has been calculated using the MDRD equation. This calculation has not been validated in all clinical    Lab Results  Component Value Date   ALT 9 10/30/2020   AST 12 (L) 10/30/2020   ALKPHOS 45 10/30/2020  BILITOT 0.6 10/30/2020       Component Value Date/Time   CRP 4.7 (H) 10/29/2020 2149       Component Value Date/Time   ESRSEDRATE 65 (H) 10/29/2020 2149    I have reviewed the micro and lab results in Epic.  Imaging: MR FOOT RIGHT W  WO CONTRAST  Result Date: 10/30/2020 CLINICAL DATA:  Chronic nonhealing right foot ulcer anterolaterally along the foot and ankle. EXAM: MRI OF THE RIGHT HINDFOOT WITHOUT AND WITH CONTRAST TECHNIQUE: Multiplanar, multisequence MR imaging of the ankle was performed. No intravenous contrast was administered. COMPARISON:  Radiographs 10/29/2020 and CT scan from 10/08/2020 FINDINGS: TENDONS Peroneal: Longitudinal tearing of the peroneus brevis in the vicinity of the lateral malleolus. Posteromedial: Distal tibialis posterior tenosynovitis and tendinopathy, correlate clinically in assessing for tibialis posterior dysfunction. Anterior: Grossly unremarkable Achilles: Unremarkable Plantar Fascia: Unremarkable LIGAMENTS Lateral: Unremarkable Medial: Unremarkable CARTILAGE Ankle Joint: No tibiotalar joint effusion or specific lesion along the plafond or talar dome. Subtalar Joints/Sinus Tarsi: Effusion of the posterior subtalar joint extending posteriorly and into the sinus tarsi. No substantial marrow edema along the joint to suggest septic arthritis. Bones: No significant degree of marrow edema or enhancement indicate osteomyelitis involving the hindfoot, midfoot, or proximal metatarsals. Other: Very large necrotic ulceration of the anterolateral distal calf, ankle, and dorsal foot. Subtraction images in series 100 demonstrate the large irregular region of nonenhancing tissue in fluid along the margin of the extensive ulceration, with necrotic tissue extending in a flap like manner anterior to the distal tibia for example on image 1 of series 100. The proximal extent of the necrotic ulceration is not completely included, nor is the distal extent, with nonenhancing tissues extending least as far as the MTP joints dorsally, for example on image 14 series 12. Along the margins of the large necrotic region there is a rind of enhancement favoring granulation tissue. IMPRESSION: 1. Very large necrotic ulceration involving the  lower calf anterolaterally, the lateral ankle, and extending into the lateral dorsal forefoot at least as far as the MTP joints. Subtraction series 100 depicts this large region of necrosis as nonenhancing tissue bordered by enhancing granulation tissue. 2. No compelling findings of active osteomyelitis involving the hindfoot, midfoot, or proximal metatarsals. I do not see a specific drainable contained abscess separate from the large ulceration. 3. Longitudinal tearing of the peroneus brevis tendon adjacent to the lateral malleolus. 4. Distal tibialis posterior tenosynovitis and tendinopathy. 5. Small effusion of the posterior subtalar joint, without marrow edema along the bony surfaces to further indicate septic arthritis. Electronically Signed   By: Van Clines M.D.   On: 10/30/2020 09:26   DG Foot Complete Right  Result Date: 10/29/2020 CLINICAL DATA:  Right foot wound for 1 month. EXAM: RIGHT FOOT COMPLETE - 3+ VIEW COMPARISON:  Right foot x-ray 10/07/2020. FINDINGS: There is soft tissue swelling of the entire foot. This is most significant along the dorsal aspect of the foot and ankle where there is soft tissue gas. There is no radiopaque foreign body. There is no acute fracture or dislocation. There is no periosteal reaction or cortical erosion identified. The joint spaces are maintained. IMPRESSION: 1. Soft tissue swelling and air overlying the dorsal aspect of the foot and ankle worrisome for infection. Abscess or necrotizing infection cannot be excluded. 2. No acute bony abnormality. Electronically Signed   By: Ronney Asters M.D.   On: 10/29/2020 15:21     Imaging independently reviewed in Epic.  [facility] [phone] pager 10/30/2020, 11:52 AM  I spent greater than 110 minutes with the patient including greater than 50% of time in face to face counsel of the patient and in coordination of their care.

## 2020-10-30 NOTE — Telephone Encounter (Signed)
FYI- current inpatient.

## 2020-10-30 NOTE — Progress Notes (Signed)
Pharmacy Antibiotic Note  [name] Price is a 80 y.o. female who was recently hospitalized at [facility] for right foot cellulitis with hemorrhagic blister.  During that admission she was treated with vancomycin/cefepime and discharged on 9/7 with augmentin and doxycycline.  She presented back to the ED on 10/29/2020 for worsening of wound. Right foot MRI on 9/18 showed very large necrotic ulceration of the lower calf and ankle, osteomyelitis or abscess noted.  She's currently on vancomycin and cefepime for infection.   Plan: - adjust vancomycin to 1000 mg IV q24h for est AUC 477 -  continue cefepime 2gm q12h  ________________________________  Height: '5\' 3"'$  (160 cm) Weight: 64.9 kg (143 lb) IBW/kg (Calculated) : 52.4  Temp (24hrs), Avg:98.9 F (37.2 C), Min:98.5 F (36.9 C), Max:99.3 F (37.4 C)  Recent Labs  Lab 10/29/20 1441 10/29/20 1637 10/29/20 2149 10/30/20 0627  WBC 7.3  --  6.7 4.8  CREATININE 0.56  --   --  0.50  LATICACIDVEN 1.6 0.9  --   --     Estimated Creatinine Clearance: 53.4 mL/min (by C-G formula based on SCr of 0.5 mg/dL).    Allergies  Allergen Reactions   Codeine Nausea And Vomiting     Thank you for allowing pharmacy to be a part of this patient's care.  Lynelle Doctor 10/30/2020 10:49 AM

## 2020-10-30 NOTE — Progress Notes (Signed)
Orthopedics Progress Note  Subjective: Patient is comfortable this AM, no complaints  Objective:  Vitals:   10/29/20 2000 10/29/20 2049  BP: (!) 109/58 (!) 141/108  Pulse: 80 81  Resp: 18 14  Temp: 98.5 F (36.9 C) 99.3 F (37.4 C)  SpO2: 95% 99%    General: Awake and alert  Musculoskeletal: right foot elevated with fresh dressing, wiggles toes, sensation intact, area of cellulitis extends onto the dorsum of the foot and the anterior shin Neurovascularly intact  Lab Results  Component Value Date   WBC 4.8 10/30/2020   HGB 7.6 (L) 10/30/2020   HCT 23.4 (L) 10/30/2020   MCV 96.7 10/30/2020   PLT 306 10/30/2020       Component Value Date/Time   NA 139 10/30/2020 0627   NA 141 08/20/2013 1245   K 4.5 10/30/2020 0627   K 4.1 08/20/2013 1245   CL 106 10/30/2020 0627   CO2 27 10/30/2020 0627   CO2 27 08/20/2013 1245   GLUCOSE 106 (H) 10/30/2020 0627   GLUCOSE 85 08/20/2013 1245   BUN 13 10/30/2020 0627   BUN 23.2 08/20/2013 1245   CREATININE 0.50 10/30/2020 0627   CREATININE 0.7 08/20/2013 1245   CALCIUM 8.0 (L) 10/30/2020 0627   CALCIUM 9.5 08/20/2013 1245   GFRNONAA >60 10/30/2020 0627   GFRAA  11/01/2006 1330    >60        The eGFR has been calculated using the MDRD equation. This calculation has not been validated in all clinical    No results found for: INR, PROTIME  Assessment/Plan: Right ankle ulcer with cellulitis, stable. Per Dr Rolena Infante, recommend additional workup with MRI(done) which shows no abscess and no osteo, and ABIs to assist with planning for next steps. Dr Doran Durand, foot and ankle specialist, to see tomorrow. ID consult recommended by Dr Rolena Infante to assist with medical management Continue empiric abx and dressing changes and foot elevation  Doran Heater. Veverly Fells, MD 10/30/2020 9:59 AM

## 2020-10-31 ENCOUNTER — Ambulatory Visit: Payer: Medicare Other | Admitting: Nurse Practitioner

## 2020-10-31 ENCOUNTER — Inpatient Hospital Stay (HOSPITAL_COMMUNITY): Payer: Medicare Other

## 2020-10-31 ENCOUNTER — Other Ambulatory Visit (HOSPITAL_COMMUNITY): Payer: Self-pay | Admitting: Orthopedic Surgery

## 2020-10-31 DIAGNOSIS — S91301A Unspecified open wound, right foot, initial encounter: Secondary | ICD-10-CM | POA: Diagnosis not present

## 2020-10-31 DIAGNOSIS — L089 Local infection of the skin and subcutaneous tissue, unspecified: Secondary | ICD-10-CM | POA: Diagnosis not present

## 2020-10-31 DIAGNOSIS — L97511 Non-pressure chronic ulcer of other part of right foot limited to breakdown of skin: Secondary | ICD-10-CM | POA: Diagnosis not present

## 2020-10-31 DIAGNOSIS — I10 Essential (primary) hypertension: Secondary | ICD-10-CM

## 2020-10-31 LAB — BASIC METABOLIC PANEL
Anion gap: 6 (ref 5–15)
BUN: 14 mg/dL (ref 8–23)
CO2: 26 mmol/L (ref 22–32)
Calcium: 7.9 mg/dL — ABNORMAL LOW (ref 8.9–10.3)
Chloride: 106 mmol/L (ref 98–111)
Creatinine, Ser: 0.52 mg/dL (ref 0.44–1.00)
GFR, Estimated: >60 mL/min (ref >60)
Glucose, Bld: 105 mg/dL — ABNORMAL HIGH (ref 70–99)
Potassium: 3.7 mmol/L (ref 3.5–5.1)
Sodium: 138 mmol/L (ref 135–145)

## 2020-10-31 LAB — CBC
HCT: 25.2 % — ABNORMAL LOW (ref 36.0–46.0)
Hemoglobin: 7.8 g/dL — ABNORMAL LOW (ref 12.0–15.0)
MCH: 29.9 pg (ref 26.0–34.0)
MCHC: 31 g/dL (ref 30.0–36.0)
MCV: 96.6 fL (ref 80.0–100.0)
Platelets: 344 10*3/uL (ref 150–400)
RBC: 2.61 MIL/uL — ABNORMAL LOW (ref 3.87–5.11)
RDW: 14.6 % (ref 11.5–15.5)
WBC: 4.6 10*3/uL (ref 4.0–10.5)
nRBC: 0 % (ref 0.0–0.2)

## 2020-10-31 LAB — SEDIMENTATION RATE: Sed Rate: 48 mm/hr — ABNORMAL HIGH (ref 0–22)

## 2020-10-31 LAB — HIV ANTIBODY (ROUTINE TESTING W REFLEX): HIV Screen 4th Generation wRfx: NONREACTIVE

## 2020-10-31 LAB — C-REACTIVE PROTEIN: CRP: 4.2 mg/dL — ABNORMAL HIGH (ref <1.0)

## 2020-10-31 MED ORDER — ADULT MULTIVITAMIN W/MINERALS CH
1.0000 | ORAL_TABLET | Freq: Every day | ORAL | Status: DC
  Administered 2020-10-31 – 2020-11-10 (×11): 1 via ORAL
  Filled 2020-10-31 (×11): qty 1

## 2020-10-31 MED ORDER — ASCORBIC ACID 500 MG PO TABS
250.0000 mg | ORAL_TABLET | Freq: Two times a day (BID) | ORAL | Status: DC
  Administered 2020-10-31 – 2020-11-10 (×20): 250 mg via ORAL
  Filled 2020-10-31 (×20): qty 1

## 2020-10-31 MED ORDER — ENSURE SURGERY PO LIQD
237.0000 mL | Freq: Two times a day (BID) | ORAL | Status: DC
  Administered 2020-11-02 – 2020-11-09 (×11): 237 mL via ORAL
  Filled 2020-10-31 (×21): qty 237

## 2020-10-31 NOTE — Progress Notes (Signed)
    Subjective:    Patient reports pain as 0 on 0-10 scale.   Denies CP or SOB.  Voiding without difficulty. Positive flatus. Objective: Vital signs in last 24 hours: Temp:  [98.2 F (36.8 C)-98.9 F (37.2 C)] 98.2 F (36.8 C) (09/19 0643) Pulse Rate:  [77-109] 77 (09/19 0643) Resp:  [17] 17 (09/19 0643) BP: (117-131)/(51-71) 131/51 (09/19 0643) SpO2:  [96 %-99 %] 99 % (09/19 0643)  Intake/Output from previous day: 09/18 0701 - 09/19 0700 In: 1272 [P.O.:1072; IV Piggyback:200] Out: 900 [Urine:900] Intake/Output this shift: No intake/output data recorded.  Labs: Recent Labs    10/29/20 1441 10/29/20 2149 10/30/20 0627 10/31/20 0445  HGB 8.5* 8.4* 7.6* 7.8*   Recent Labs    10/30/20 0627 10/31/20 0445  WBC 4.8 4.6  RBC 2.42*  2.43* 2.61*  HCT 23.4* 25.2*  PLT 306 344   Recent Labs    10/30/20 0627 10/31/20 0445  NA 139 138  K 4.5 3.7  CL 106 106  CO2 27 26  BUN 13 14  CREATININE 0.50 0.52  GLUCOSE 106* 105*  CALCIUM 8.0* 7.9*   No results for input(s): LABPT, INR in the last 72 hours.  Physical Exam: Neurologically intact ABD soft Dorsiflexion/Plantar flexion intact Compartment soft Body mass index is 25.33 kg/m.   Assessment/Plan: MRI has been completed.  Demonstrates large necrotic ulceration involving lower calf anterior laterally, the lateral ankle, and extending into the lateral dorsal forefoot to at least as far as the MTP joints.  Large region of necrosis is nonenhancing tissue bordering by enhancing granulation tissue.  No evidence of active osteomyelitis.  No septic drainable abscess noted.  Appreciate the infectious disease consultation.  Continue IV antibiotics as recommended.  Will discuss results with Dr. Doran Durand and determine appropriate treatment plan.  Recommend n.p.o. for now until we determine treatment options.   Dahlia Bailiff for Dr. Melina Schools [facility] [phone] 10/31/2020, 7:56 AM

## 2020-10-31 NOTE — Progress Notes (Signed)
PROGRESS NOTE  [name] Price [id number] DOB: 02/06/1941 DOA: 10/29/2020 PCP: Tonia Ghent, MD   LOS: 2 days   Brief Narrative / Interim history: 80 year old female with history of depression, hypertension, but otherwise fairly healthy who comes into the hospital with persistent right ankle cellulitis.  She was recently hospitalized 8/27-9/7 for right ankle cellulitis.  She came in with a lesion in the end of August that appeared suddenly, patient does not recall any injury/bites to that ankle.  During the hospital stay there was concern for poison ivy due to a blister and was briefly on steroids as well along with intravenous antibiotics.  Orthopedic surgery was consulted and the blister was lanced with what appeared to be 60 to 80 cc of clear, noninfectious, nonhemorrhagic fluid drained.  She was transitioned to oral antibiotics and eventually discharged home on 9/7.  She has been on antibiotics since but continued to have purulent discharge as well as redness, and increasing her ulceration.  She came back to the hospital and was admitted again 9/17  Subjective / 24h Interval events: She is feeling better, less pain in the right ankle  Assessment & Plan: Principal Problem Right foot wound, right foot cellulitis, sepsis ruled out-orthopedic surgery consulted, appreciate input.  She underwent an MRI of the ankle which was negative for deep processes -ID consulted, discussed with Dr. Drucilla Schmidt, this is less likely a simple bacterial infection as she did not respond to antibiotics.  Differential is a nontuberculous Mycobacteria versus autoimmune phenomenon such as pyoderma gangrenosum.  ANCA profile was sent today -Dr. Doran Durand evaluated patient today, lot of necrotic tissue and not sure if foot is salvageable, Dr. Marla Roe with plastics was consulted as well  Active Problems Iron deficiency anemia-start iron supplementations.  Hemoglobin worse likely in the setting of worsening  wound/chronic inflammation  PSVT-during her prior hospital stay occasional episodes of SVT were noted in telemetry, asymptomatic, started on beta-blockers.  Continue.  2D echo at that time was unremarkable.  GERD-continue PPI  Scheduled Meds:  ferrous gluconate  324 mg Oral BID WC   metoprolol tartrate  12.5 mg Oral BID   pantoprazole (PROTONIX) IV  40 mg Intravenous Q12H   PARoxetine  25 mg Oral q morning   Continuous Infusions:  PRN Meds:.acetaminophen **OR** acetaminophen, fentaNYL (SUBLIMAZE) injection  Diet Orders (From admission, onward)     Start     Ordered   10/31/20 1313  Diet regular Room service appropriate? Yes; Fluid consistency: Thin  Diet effective now       Question Answer Comment  Room service appropriate? Yes   Fluid consistency: Thin      10/31/20 1312            DVT prophylaxis: SCDs Start: 10/29/20 2124     Code Status: Full Code  Family Communication: No family at bedside  Status is: Inpatient  Remains inpatient appropriate because:Inpatient level of care appropriate due to severity of illness  Dispo: The patient is from: Home              Anticipated d/c is to: Home              Patient currently is not medically stable to d/c.   Difficult to place patient No   Level of care: Telemetry  Consultants:  ID Orthopedic surgery   Procedures:  none  Microbiology  Blood cultures 9/17-no growth to date  Antimicrobials: Vancomycin 9/17 >> Cefepime 9/17 >>   Objective: Vitals:   10/30/20  1346 10/30/20 2235 10/31/20 0643 10/31/20 1315  BP: 117/71 126/68 (!) 131/51 128/71  Pulse: (!) 109 100 77 78  Resp:  '17 17 18  '$ Temp: 98.3 F (36.8 C) 98.9 F (37.2 C) 98.2 F (36.8 C) 99.1 F (37.3 C)  TempSrc: Oral Oral Oral Oral  SpO2: 96%  99% 98%  Weight:      Height:        Intake/Output Summary (Last 24 hours) at 10/31/2020 1412 Last data filed at 10/31/2020 0358 Gross per 24 hour  Intake 440 ml  Output --  Net 440 ml     Filed Weights   10/29/20 1318  Weight: 64.9 kg    Examination:  Constitutional: She is in no distress Eyes: No scleral icterus ENMT: mmm Neck: normal, supple Respiratory: Lungs are clear bilaterally, no wheezing or crackles heard.  Normal respiratory effort. Cardiovascular: Regular rate and rhythm, no murmurs, no edema Abdomen: Soft, NT, ND, bowel sounds positive Musculoskeletal: no clubbing / cyanosis.  Skin: Large necrotic area right lower extremity, unchanged today Neurologic: Nonfocal Psychiatric: Nonfocal       Data Reviewed: I have independently reviewed following labs and imaging studies   CBC: Recent Labs  Lab 10/29/20 1441 10/29/20 2149 10/30/20 0627 10/31/20 0445  WBC 7.3 6.7 4.8 4.6  NEUTROABS 5.8  --  2.9  --   HGB 8.5* 8.4* 7.6* 7.8*  HCT 26.5* 25.8* 23.4* 25.2*  MCV 95.7 96.3 96.7 96.6  PLT 330 325 306 [id number]    Basic Metabolic Panel: Recent Labs  Lab 10/29/20 1441 10/29/20 2149 10/30/20 0627 10/31/20 0445  NA 137  --  139 138  K 4.0  --  4.5 3.7  CL 102  --  106 106  CO2 26  --  27 26  GLUCOSE 108*  --  106* 105*  BUN 17  --  13 14  CREATININE 0.56  --  0.50 0.52  CALCIUM 8.7*  --  8.0* 7.9*  MG  --  1.9 1.8  --   PHOS  --  3.3 3.2  --     Liver Function Tests: Recent Labs  Lab 10/29/20 1441 10/30/20 0627  AST 12* 12*  ALT 7 9  ALKPHOS 53 45  BILITOT 0.5 0.6  PROT 5.9* 5.2*  ALBUMIN 3.0* 2.2*    Coagulation Profile: No results for input(s): INR, PROTIME in the last 168 hours. HbA1C: Recent Labs    10/29/20 2149  HGBA1C 4.6*    CBG: No results for input(s): GLUCAP in the last 168 hours.  Recent Results (from the past 240 hour(s))  Blood culture (routine x 2)     Status: None (Preliminary result)   Collection Time: 10/29/20  2:30 PM   Specimen: BLOOD  Result Value Ref Range Status   Specimen Description   Final    BLOOD RIGHT ANTECUBITAL Performed at [facility], [address],  [place], [place] [address]    Special Requests   Final    BOTTLES DRAWN AEROBIC AND ANAEROBIC Blood Culture results may not be optimal due to an inadequate volume of blood received in culture bottles Performed at [facility], [address] [place], [place] [address]    Culture   Final    NO GROWTH 2 DAYS Performed at [facility], [place] [address] [place], [place] [address]    Report Status PENDING  Incomplete  Resp Panel by RT-PCR (Flu A&B, Covid) Nasopharyngeal Swab     Status: None  Collection Time: 10/29/20  2:32 PM   Specimen: Nasopharyngeal Swab; Nasopharyngeal(NP) swabs in vial transport medium  Result Value Ref Range Status   SARS Coronavirus 2 by RT PCR NEGATIVE NEGATIVE Final    Comment: (NOTE) SARS-CoV-2 target nucleic acids are NOT DETECTED.  The SARS-CoV-2 RNA is generally detectable in upper respiratory specimens during the acute phase of infection. The lowest concentration of SARS-CoV-2 viral copies this assay can detect is 138 copies/mL. A negative result does not preclude SARS-Cov-2 infection and should not be used as the sole basis for treatment or other patient management decisions. A negative result may occur with  improper specimen collection/handling, submission of specimen other than nasopharyngeal swab, presence of viral mutation(s) within the areas targeted by this assay, and inadequate number of viral copies(<138 copies/mL). A negative result must be combined with clinical observations, patient history, and epidemiological information. The expected result is Negative.  Fact Sheet for Patients:  EntrepreneurPulse.com.au  Fact Sheet for Healthcare Providers:  IncredibleEmployment.be  This test is no t yet approved or cleared by the [place] FDA and  has been authorized for detection and/or diagnosis of SARS-CoV-2 by FDA under an Emergency Use Authorization (EUA). This EUA will remain  in  effect (meaning this test can be used) for the duration of the COVID-19 declaration under Section 564(b)(1) of the Act, 21 U.S.C.section 360bbb-3(b)(1), unless the authorization is terminated  or revoked sooner.       Influenza A by PCR NEGATIVE NEGATIVE Final   Influenza B by PCR NEGATIVE NEGATIVE Final    Comment: (NOTE) The Xpert Xpress SARS-CoV-2/FLU/RSV plus assay is intended as an aid in the diagnosis of influenza from Nasopharyngeal swab specimens and should not be used as a sole basis for treatment. Nasal washings and aspirates are unacceptable for Xpert Xpress SARS-CoV-2/FLU/RSV testing.  Fact Sheet for Patients: EntrepreneurPulse.com.au  Fact Sheet for Healthcare Providers: IncredibleEmployment.be  This test is not yet approved or cleared by the [place] FDA and has been authorized for detection and/or diagnosis of SARS-CoV-2 by FDA under an Emergency Use Authorization (EUA). This EUA will remain in effect (meaning this test can be used) for the duration of the COVID-19 declaration under Section 564(b)(1) of the Act, 21 U.S.C. section 360bbb-3(b)(1), unless the authorization is terminated or revoked.  Performed at [facility], [address] [place], [place] [address]   Blood culture (routine x 2)     Status: None (Preliminary result)   Collection Time: 10/29/20  2:35 PM   Specimen: BLOOD  Result Value Ref Range Status   Specimen Description   Final    BLOOD LEFT ANTECUBITAL Performed at [facility], [address] [place], [place] [address]    Special Requests   Final    BOTTLES DRAWN AEROBIC AND ANAEROBIC Blood Culture results may not be optimal due to an inadequate volume of blood received in culture bottles Performed at [facility], [address], [place], [place] [address]    Culture   Final    NO GROWTH 2 DAYS Performed at [facility],  [place] [address], [place], [place] [address]    Report Status PENDING  Incomplete      Radiology Studies: VAS [place] ABI WITH/WO TBI  Result Date: 10/31/2020  LOWER EXTREMITY DOPPLER STUDY Patient Name:  [name] Price  Date of Exam:   10/31/2020 Medical Rec #: [id number]          Accession #:    [id number] Date  of Birth: 09/12/1940          Patient Gender: F Patient Age:   8 years Exam Location:  [facility] Procedure:      VAS [place] ABI WITH/WO TBI Referring Phys: ANASTASSIA DOUTOVA --------------------------------------------------------------------------------  Indications: Ulceration. High Risk Factors: None.  Limitations: Today's exam was limited due to an open wound and bandages. Comparison Study: No prior studies. Performing Technologist: Carlos Levering RVT  Examination Guidelines: A complete evaluation includes at minimum, Doppler waveform signals and systolic blood pressure reading at the level of bilateral brachial, anterior tibial, and posterior tibial arteries, when vessel segments are accessible. Bilateral testing is considered an integral part of a complete examination. Photoelectric Plethysmograph (PPG) waveforms and toe systolic pressure readings are included as required and additional duplex testing as needed. Limited examinations for reoccurring indications may be performed as noted.  ABI Findings: +---------+------------------+-----+---------+--------+ Right    Rt Pressure (mmHg)IndexWaveform Comment  +---------+------------------+-----+---------+--------+ Brachial 140                    triphasic         +---------+------------------+-----+---------+--------+ PTA      194               1.39 biphasic          +---------+------------------+-----+---------+--------+ DP       193               1.38 biphasic          +---------+------------------+-----+---------+--------+ Great Toe84                0.60                    +---------+------------------+-----+---------+--------+ +---------+------------------+-----+---------+-------+ Left     Lt Pressure (mmHg)IndexWaveform Comment +---------+------------------+-----+---------+-------+ Brachial 135                    triphasic        +---------+------------------+-----+---------+-------+ PTA      192               1.37 triphasic        +---------+------------------+-----+---------+-------+ DP       170               1.21 triphasic        +---------+------------------+-----+---------+-------+ Great Toe116               0.83                  +---------+------------------+-----+---------+-------+ +-------+-----------+-----------+------------+------------+ ABI/TBIToday's ABIToday's TBIPrevious ABIPrevious TBI +-------+-----------+-----------+------------+------------+ Right  1.39       0.6                                 +-------+-----------+-----------+------------+------------+ Left   1.37       0.83                                +-------+-----------+-----------+------------+------------+  Summary: Right: Resting right ankle-brachial index indicates noncompressible right lower extremity arteries. The right toe-brachial index is abnormal. Left: Resting left ankle-brachial index indicates noncompressible left lower extremity arteries. The left toe-brachial index is normal.  *See table(s) above for measurements and observations.     Preliminary      Marzetta Board, MD, PhD [facility]  Between 7 am - 7 pm I am available, please  contact me via Amion (for emergencies) or Securechat (non urgent messages)  Between 7 pm - 7 am I am not available, please contact night coverage MD/APP via Amion

## 2020-10-31 NOTE — Progress Notes (Signed)
Subjective: No new complaints   Antibiotics:  Anti-infectives (From admission, onward)    Start     Dose/Rate Route Frequency Ordered Stop   10/31/20 0500  vancomycin (VANCOCIN) IVPB 1000 mg/200 mL premix        1,000 mg 200 mL/hr over 60 Minutes Intravenous Every 24 hours 10/30/20 1057     10/30/20 0500  vancomycin (VANCOREADY) IVPB 1250 mg/250 mL  Status:  Discontinued        1,250 mg 166.7 mL/hr over 90 Minutes Intravenous Every 24 hours 10/29/20 2344 10/30/20 1057   10/30/20 0400  ceFEPIme (MAXIPIME) 2 g in sodium chloride 0.9 % 100 mL IVPB        2 g 200 mL/hr over 30 Minutes Intravenous Every 12 hours 10/29/20 2344     10/29/20 1630  vancomycin (VANCOCIN) IVPB 1000 mg/200 mL premix        1,000 mg 200 mL/hr over 60 Minutes Intravenous  Once 10/29/20 1614 10/29/20 1911   10/29/20 1615  ceFEPIme (MAXIPIME) 2 g in sodium chloride 0.9 % 100 mL IVPB        2 g 200 mL/hr over 30 Minutes Intravenous  Once 10/29/20 1614 10/29/20 1911       Medications: Scheduled Meds:  ferrous gluconate  324 mg Oral BID WC   metoprolol tartrate  12.5 mg Oral BID   pantoprazole (PROTONIX) IV  40 mg Intravenous Q12H   PARoxetine  25 mg Oral q morning   Continuous Infusions:  ceFEPime (MAXIPIME) IV 2 g (10/31/20 0424)   vancomycin 1,000 mg (10/31/20 0523)   PRN Meds:.acetaminophen **OR** acetaminophen, fentaNYL (SUBLIMAZE) injection    Objective: Weight change:   Intake/Output Summary (Last 24 hours) at 10/31/2020 1043 Last data filed at 10/31/2020 0358 Gross per 24 hour  Intake 680 ml  Output --  Net 680 ml   Blood pressure (!) 131/51, pulse 77, temperature 98.2 F (36.8 C), temperature source Oral, resp. rate 17, height 5\' 3"  (1.6 m), weight 64.9 kg, SpO2 99 %. Temp:  [98.2 F (36.8 C)-98.9 F (37.2 C)] 98.2 F (36.8 C) (09/19 0643) Pulse Rate:  [77-109] 77 (09/19 0643) Resp:  [17] 17 (09/19 0643) BP: (117-131)/(51-71) 131/51 (09/19 0643) SpO2:  [96 %-99 %] 99 %  (09/19 7939)  Physical Exam: Physical Exam Constitutional:      General: She is not in acute distress.    Appearance: She is well-developed. She is not diaphoretic.  HENT:     Head: Normocephalic and atraumatic.     Right Ear: External ear normal.     Left Ear: External ear normal.     Mouth/Throat:     Pharynx: No oropharyngeal exudate.  Eyes:     General: No scleral icterus.    Extraocular Movements: Extraocular movements intact.     Conjunctiva/sclera: Conjunctivae normal.     Pupils: Pupils are equal, round, and reactive to light.  Cardiovascular:     Rate and Rhythm: Normal rate and regular rhythm.     Heart sounds: No murmur heard. Pulmonary:     Effort: Pulmonary effort is normal. No respiratory distress.     Breath sounds: No wheezing or rales.  Abdominal:     General: There is no distension.     Palpations: Abdomen is soft.  Musculoskeletal:        General: No tenderness. Normal range of motion.  Lymphadenopathy:     Cervical: No cervical adenopathy.  Skin:  General: Skin is warm and dry.     Coloration: Skin is not pale.     Findings: Erythema and rash present.  Neurological:     General: No focal deficit present.     Mental Status: She is alert and oriented to person, place, and time.     Motor: No abnormal muscle tone.     Coordination: Coordination normal.  Psychiatric:        Mood and Affect: Mood normal.        Behavior: Behavior normal.        Thought Content: Thought content normal.        Judgment: Judgment normal.     Picture of her leg that she took after her hospital discharge:       Picture of leg today 10/31/2020       CBC:    BMET Recent Labs    10/30/20 0627 10/31/20 0445  NA 139 138  K 4.5 3.7  CL 106 106  CO2 27 26  GLUCOSE 106* 105*  BUN 13 14  CREATININE 0.50 0.52  CALCIUM 8.0* 7.9*     Liver Panel  Recent Labs    10/29/20 1441 10/30/20 0627  PROT 5.9* 5.2*  ALBUMIN 3.0* 2.2*  AST 12* 12*  ALT 7 9   ALKPHOS 53 45  BILITOT 0.5 0.6       Sedimentation Rate Recent Labs    10/29/20 2149  ESRSEDRATE 65*   C-Reactive Protein Recent Labs    10/29/20 2149  CRP 4.7*    Micro Results: Recent Results (from the past 720 hour(s))  Resp Panel by RT-PCR (Flu A&B, Covid) Nasopharyngeal Swab     Status: None   Collection Time: 10/08/20  1:57 PM   Specimen: Nasopharyngeal Swab; Nasopharyngeal(NP) swabs in vial transport medium  Result Value Ref Range Status   SARS Coronavirus 2 by RT PCR NEGATIVE NEGATIVE Final    Comment: (NOTE) SARS-CoV-2 target nucleic acids are NOT DETECTED.  The SARS-CoV-2 RNA is generally detectable in upper respiratory specimens during the acute phase of infection. The lowest concentration of SARS-CoV-2 viral copies this assay can detect is 138 copies/mL. A negative result does not preclude SARS-Cov-2 infection and should not be used as the sole basis for treatment or other patient management decisions. A negative result may occur with  improper specimen collection/handling, submission of specimen other than nasopharyngeal swab, presence of viral mutation(s) within the areas targeted by this assay, and inadequate number of viral copies(<138 copies/mL). A negative result must be combined with clinical observations, patient history, and epidemiological information. The expected result is Negative.  Fact Sheet for Patients:  EntrepreneurPulse.com.au  Fact Sheet for Healthcare Providers:  IncredibleEmployment.be  This test is no t yet approved or cleared by the [place] FDA and  has been authorized for detection and/or diagnosis of SARS-CoV-2 by FDA under an Emergency Use Authorization (EUA). This EUA will remain  in effect (meaning this test can be used) for the duration of the COVID-19 declaration under Section 564(b)(1) of the Act, 21 U.S.C.section 360bbb-3(b)(1), unless the authorization is terminated  or  revoked sooner.       Influenza A by PCR NEGATIVE NEGATIVE Final   Influenza B by PCR NEGATIVE NEGATIVE Final    Comment: (NOTE) The Xpert Xpress SARS-CoV-2/FLU/RSV plus assay is intended as an aid in the diagnosis of influenza from Nasopharyngeal swab specimens and should not be used as a sole basis for treatment. Nasal washings and aspirates are  unacceptable for Xpert Xpress SARS-CoV-2/FLU/RSV testing.  Fact Sheet for Patients: EntrepreneurPulse.com.au  Fact Sheet for Healthcare Providers: IncredibleEmployment.be  This test is not yet approved or cleared by the [place] FDA and has been authorized for detection and/or diagnosis of SARS-CoV-2 by FDA under an Emergency Use Authorization (EUA). This EUA will remain in effect (meaning this test can be used) for the duration of the COVID-19 declaration under Section 564(b)(1) of the Act, 21 U.S.C. section 360bbb-3(b)(1), unless the authorization is terminated or revoked.  Performed at [facility], [place] [address], [place], [place] [address]   Blood Culture (routine x 2)     Status: None   Collection Time: 10/08/20  2:05 PM   Specimen: BLOOD  Result Value Ref Range Status   Specimen Description BLOOD LEFT ANTECUBITAL  Final   Special Requests   Final    BOTTLES DRAWN AEROBIC AND ANAEROBIC Blood Culture adequate volume   Culture   Final    NO GROWTH 5 DAYS Performed at [facility], [place] [address], [place], [place] [address]    Report Status 10/13/2020 FINAL  Final  Blood Culture (routine x 2)     Status: None   Collection Time: 10/08/20  2:14 PM   Specimen: BLOOD  Result Value Ref Range Status   Specimen Description BLOOD SITE NOT SPECIFIED  Final   Special Requests   Final    BOTTLES DRAWN AEROBIC AND ANAEROBIC Blood Culture results may not be optimal due to an inadequate volume of blood received in culture bottles   Culture   Final    NO GROWTH 5 DAYS Performed at  [facility], [place] [address], [place], [place] [address]    Report Status 10/13/2020 FINAL  Final  C Difficile Quick Screen w PCR reflex     Status: None   Collection Time: 10/08/20  4:24 PM   Specimen: STOOL  Result Value Ref Range Status   C Diff antigen NEGATIVE NEGATIVE Final   C Diff toxin NEGATIVE NEGATIVE Final   C Diff interpretation No C. difficile detected.  Final    Comment: Performed at [facility], [place] [address] [place], [place] [address]  Blood culture (routine x 2)     Status: None (Preliminary result)   Collection Time: 10/29/20  2:30 PM   Specimen: BLOOD  Result Value Ref Range Status   Specimen Description   Final    BLOOD RIGHT ANTECUBITAL Performed at [facility], [address], [place], [place] [address]    Special Requests   Final    BOTTLES DRAWN AEROBIC AND ANAEROBIC Blood Culture results may not be optimal due to an inadequate volume of blood received in culture bottles Performed at [facility], [address] [place], [place] [address]    Culture   Final    NO GROWTH 2 DAYS Performed at [facility], [place] [address], [place], [place] [address]    Report Status PENDING  Incomplete  Resp Panel by RT-PCR (Flu A&B, Covid) Nasopharyngeal Swab     Status: None   Collection Time: 10/29/20  2:32 PM   Specimen: Nasopharyngeal Swab; Nasopharyngeal(NP) swabs in vial transport medium  Result Value Ref Range Status   SARS Coronavirus 2 by RT PCR NEGATIVE NEGATIVE Final    Comment: (NOTE) SARS-CoV-2 target nucleic acids are NOT DETECTED.  The SARS-CoV-2 RNA is generally detectable in upper respiratory specimens during the acute phase of infection. The  lowest concentration of SARS-CoV-2 viral copies this assay can detect is 138 copies/mL. A negative result does not preclude SARS-Cov-2 infection and should not be used as the sole basis for treatment or other patient management decisions. A negative  result may occur with  improper specimen collection/handling, submission of specimen other than nasopharyngeal swab, presence of viral mutation(s) within the areas targeted by this assay, and inadequate number of viral copies(<138 copies/mL). A negative result must be combined with clinical observations, patient history, and epidemiological information. The expected result is Negative.  Fact Sheet for Patients:  EntrepreneurPulse.com.au  Fact Sheet for Healthcare Providers:  IncredibleEmployment.be  This test is no t yet approved or cleared by the [place] FDA and  has been authorized for detection and/or diagnosis of SARS-CoV-2 by FDA under an Emergency Use Authorization (EUA). This EUA will remain  in effect (meaning this test can be used) for the duration of the COVID-19 declaration under Section 564(b)(1) of the Act, 21 U.S.C.section 360bbb-3(b)(1), unless the authorization is terminated  or revoked sooner.       Influenza A by PCR NEGATIVE NEGATIVE Final   Influenza B by PCR NEGATIVE NEGATIVE Final    Comment: (NOTE) The Xpert Xpress SARS-CoV-2/FLU/RSV plus assay is intended as an aid in the diagnosis of influenza from Nasopharyngeal swab specimens and should not be used as a sole basis for treatment. Nasal washings and aspirates are unacceptable for Xpert Xpress SARS-CoV-2/FLU/RSV testing.  Fact Sheet for Patients: EntrepreneurPulse.com.au  Fact Sheet for Healthcare Providers: IncredibleEmployment.be  This test is not yet approved or cleared by the [place] FDA and has been authorized for detection and/or diagnosis of SARS-CoV-2 by FDA under an Emergency Use Authorization (EUA). This EUA will remain in effect (meaning this test can be used) for the duration of the COVID-19 declaration under Section 564(b)(1) of the Act, 21 U.S.C. section 360bbb-3(b)(1), unless the authorization is  terminated or revoked.  Performed at [facility], [address], [place], [place] [address]   Blood culture (routine x 2)     Status: None (Preliminary result)   Collection Time: 10/29/20  2:35 PM   Specimen: BLOOD  Result Value Ref Range Status   Specimen Description   Final    BLOOD LEFT ANTECUBITAL Performed at [facility], [address], [place], [place] [address]    Special Requests   Final    BOTTLES DRAWN AEROBIC AND ANAEROBIC Blood Culture results may not be optimal due to an inadequate volume of blood received in culture bottles Performed at [facility], [address] [place], [place] [address]    Culture   Final    NO GROWTH 2 DAYS Performed at [facility], [place] [address], [place], [place] [address]    Report Status PENDING  Incomplete    Studies/Results: MR FOOT RIGHT W WO CONTRAST  Result Date: 10/30/2020 CLINICAL DATA:  Chronic nonhealing right foot ulcer anterolaterally along the foot and ankle. EXAM: MRI OF THE RIGHT HINDFOOT WITHOUT AND WITH CONTRAST TECHNIQUE: Multiplanar, multisequence MR imaging of the ankle was performed. No intravenous contrast was administered. COMPARISON:  Radiographs 10/29/2020 and CT scan from 10/08/2020 FINDINGS: TENDONS Peroneal: Longitudinal tearing of the peroneus brevis in the vicinity of the lateral malleolus. Posteromedial: Distal tibialis posterior tenosynovitis and tendinopathy, correlate clinically in assessing for tibialis posterior dysfunction. Anterior: Grossly unremarkable Achilles: Unremarkable Plantar Fascia: Unremarkable LIGAMENTS Lateral: Unremarkable Medial: Unremarkable CARTILAGE Ankle Joint: No tibiotalar joint effusion or specific lesion along the  plafond or talar dome. Subtalar Joints/Sinus Tarsi: Effusion of the posterior subtalar joint extending posteriorly and into the sinus tarsi. No substantial marrow edema along the joint to suggest septic  arthritis. Bones: No significant degree of marrow edema or enhancement indicate osteomyelitis involving the hindfoot, midfoot, or proximal metatarsals. Other: Very large necrotic ulceration of the anterolateral distal calf, ankle, and dorsal foot. Subtraction images in series 100 demonstrate the large irregular region of nonenhancing tissue in fluid along the margin of the extensive ulceration, with necrotic tissue extending in a flap like manner anterior to the distal tibia for example on image 1 of series 100. The proximal extent of the necrotic ulceration is not completely included, nor is the distal extent, with nonenhancing tissues extending least as far as the MTP joints dorsally, for example on image 14 series 12. Along the margins of the large necrotic region there is a rind of enhancement favoring granulation tissue. IMPRESSION: 1. Very large necrotic ulceration involving the lower calf anterolaterally, the lateral ankle, and extending into the lateral dorsal forefoot at least as far as the MTP joints. Subtraction series 100 depicts this large region of necrosis as nonenhancing tissue bordered by enhancing granulation tissue. 2. No compelling findings of active osteomyelitis involving the hindfoot, midfoot, or proximal metatarsals. I do not see a specific drainable contained abscess separate from the large ulceration. 3. Longitudinal tearing of the peroneus brevis tendon adjacent to the lateral malleolus. 4. Distal tibialis posterior tenosynovitis and tendinopathy. 5. Small effusion of the posterior subtalar joint, without marrow edema along the bony surfaces to further indicate septic arthritis. Electronically Signed   By: Van Clines M.D.   On: 10/30/2020 09:26   DG Foot Complete Right  Result Date: 10/29/2020 CLINICAL DATA:  Right foot wound for 1 month. EXAM: RIGHT FOOT COMPLETE - 3+ VIEW COMPARISON:  Right foot x-ray 10/07/2020. FINDINGS: There is soft tissue swelling of the entire foot.  This is most significant along the dorsal aspect of the foot and ankle where there is soft tissue gas. There is no radiopaque foreign body. There is no acute fracture or dislocation. There is no periosteal reaction or cortical erosion identified. The joint spaces are maintained. IMPRESSION: 1. Soft tissue swelling and air overlying the dorsal aspect of the foot and ankle worrisome for infection. Abscess or necrotizing infection cannot be excluded. 2. No acute bony abnormality. Electronically Signed   By: Ronney Asters M.D.   On: 10/29/2020 15:21   VAS [place] ABI WITH/WO TBI  Result Date: 10/31/2020  LOWER EXTREMITY DOPPLER STUDY Patient Name:  [name] Price  Date of Exam:   10/31/2020 Medical Rec #: [id number]          Accession #:    [id number] Date of Birth: May 17, 1940          Patient Gender: F Patient Age:   80 years Exam Location:  [facility] Procedure:      VAS [place] ABI WITH/WO TBI Referring Phys: Nyoka Lint DOUTOVA --------------------------------------------------------------------------------  Indications: Ulceration. High Risk Factors: None.  Limitations: Today's exam was limited due to an open wound and bandages. Comparison Study: No prior studies. Performing Technologist: Carlos Levering RVT  Examination Guidelines: A complete evaluation includes at minimum, Doppler waveform signals and systolic blood pressure reading at the level of bilateral brachial, anterior tibial, and posterior tibial arteries, when vessel segments are accessible. Bilateral testing is considered an integral part of a complete examination. Photoelectric Plethysmograph (PPG) waveforms and toe systolic pressure readings are included  as required and additional duplex testing as needed. Limited examinations for reoccurring indications may be performed as noted.  ABI Findings: +---------+------------------+-----+---------+--------+ Right    Rt Pressure (mmHg)IndexWaveform Comment   +---------+------------------+-----+---------+--------+ Brachial 140                    triphasic         +---------+------------------+-----+---------+--------+ PTA      194               1.39 biphasic          +---------+------------------+-----+---------+--------+ DP       193               1.38 biphasic          +---------+------------------+-----+---------+--------+ Great Toe84                0.60                   +---------+------------------+-----+---------+--------+ +---------+------------------+-----+---------+-------+ Left     Lt Pressure (mmHg)IndexWaveform Comment +---------+------------------+-----+---------+-------+ Brachial 135                    triphasic        +---------+------------------+-----+---------+-------+ PTA      192               1.37 triphasic        +---------+------------------+-----+---------+-------+ DP       170               1.21 triphasic        +---------+------------------+-----+---------+-------+ Great Toe116               0.83                  +---------+------------------+-----+---------+-------+ +-------+-----------+-----------+------------+------------+ ABI/TBIToday's ABIToday's TBIPrevious ABIPrevious TBI +-------+-----------+-----------+------------+------------+ Right  1.39       0.6                                 +-------+-----------+-----------+------------+------------+ Left   1.37       0.83                                +-------+-----------+-----------+------------+------------+  Summary: Right: Resting right ankle-brachial index indicates noncompressible right lower extremity arteries. The right toe-brachial index is abnormal. Left: Resting left ankle-brachial index indicates noncompressible left lower extremity arteries. The left toe-brachial index is normal.  *See table(s) above for measurements and observations.     Preliminary       Assessment/Plan:  INTERVAL HISTORY: I without  evidence of septic arthritis or osteomyelitis or abscess   Active Problems:   Sepsis ([place])   Right foot infection   Anemia   HTN (hypertension)    NAKEYSHA PASQUAL is a 80 y.o. female with  subacute onset of right foot ulcer that followed initial problems with allergies, scratching, then pedicure and a week later with pain, swelling and erythema. She was seen at [facility] and prescribed Keflex, but had worsening of necrotic like appearance to the wound and came back, treat again with cefazolin and had unroofing of her blister with bloody material that came out, briefly treated with steroids then with vancomycin and cefepime through number the 6, then discharged on Augmentin and doxycycline but apparent worsening of her wound.  Is again on broad-spectrum antibiotics in the form  of vancomycin and cefepime.  And talk to the patient she does not have pain in her ankle and she never had fever or other systemic symptoms to suggest severe infection.   I do not think this is a pyogenic bacterial infection.  It could certainly be a manifestation of a nontuberculous mycobacteria given her recent pedicure, or another subacute organism such as nocardia as she did do gardening with her feet exposed in the time.  Prior to onset of illness.  This could also be an autoimmune phenomenon such as pyoderma gangrenosum  I am stopping her IV antibiotics.  I think she clearly needs biopsies with a specimen to pathology and another specimen in sterile saline (NO FORMALIN or fixatives) with this one sent for AFB cultures and fungal cultures.  Would send off auto-immune labs  Finally would strongly consider transfer to tertiary care center with Dermatology expertise.  I spen 37 minutes with the patient including  face to face counseling of the patient re her skin ulcer personally reviewing MRI foot, review of CBC BMP from today, along with HIV antibody, review medical records in preparation for the visit  and during the visit and in coordination of her care.        LOS: 2 days   Alcide Evener 10/31/2020, 10:43 AM

## 2020-10-31 NOTE — Consult Note (Addendum)
Reason for Consult: Right ankle wound Referring Physician: Dr. Coral Else AZUL BRIERLY is an 80 y.o. female.  HPI: Patient is a pleasant 80 year old female with no significant past medical history who is currently admitted for right foot wound.    Reviewed patient's medical record and she had previously been admitted from 10/08/2020 for sepsis in context of her right foot cellulitis with hemorrhagic blister.  Orthopedics were consulted and Dr. Alvan Dame lanced blister at bedside 10/09/2020, drained 60-80 cc clear, noninfectious, non-hemorrhagic fluid.  Patient continued to experience worsening erythema during hospitalization for which she was given IV vancomycin and cefepime.  She was ultimately discharged on 10/19/2020 with home health nurse arranged by TOC.  Patient then returned to the ED on 10/29/2020 for worsening right foot wound despite outpatient antibiotics and home health aide.  She was not septic appearing, but plain films were concerning for possible necrotizing fasciitis involving the ankle and Dr. Rolena Infante, orthopedics, recommended admission for MRI and possible BKA.  I reviewed MRI which did not reveal any evidence of acute osteomyelitis.  ID was consulted, differential is nontuberculous mycobacteria versus autoimmune phenomenon.  ANCA is pending.  Inflammatory markers downtrending since admission.  CBC without leukocytosis.  On my examination, she denies any history of DM, tobacco use, or arterial insufficiency.  No history of prior wounds/ulcerations.  Patient states that she lives at home with her husband who is able to help care for her.  She also states that her daughter was at home assisting with dressing changes prior to her most recent admission.  She has a couple of dogs at home who she walks regularly.  She states that she is an active 80 year old she does not have any significant medical history.  She is frustrated because she is never sick and now there is the possibility that she will  have a BKA.  She understands why it might be necessary, but only wants it as an absolute last resort.  Patient reports that the skin adjacent to her wound has been hot to the touch for the past 4 to 5 weeks and feels as though her erythema worsened since discharge home.  She denies any significant right lower extremity discomfort, recent fevers or chills, or recent purulent discharge.  Patient also reports that her swelling has improved considerably during current admission..  Past Medical History:  Diagnosis Date   Allergy    Anxiety    Arthritis    Blood transfusion without reported diagnosis    as baby   Breast cancer ([place]) 1999-2000   s/p lumpectomy and radiation, no chemo-right   History of chicken pox    History of hip replacement    Hot flashes    treated wtih paxil   Hyperlipidemia    Osteoporosis    DXA 11/12/17   UTI (urinary tract infection)     Past Surgical History:  Procedure Laterality Date   BREAST SURGERY Right    CATARACT EXTRACTION W/PHACO Left 12/28/2014   Procedure: CATARACT EXTRACTION PHACO AND INTRAOCULAR LENS PLACEMENT ([place]);  Surgeon: Birder Robson, MD;  Location: [facility] ORS;  Service: Ophthalmology;  Laterality: Left;  [place] 00:38 AP% 19.9 CDE 7.70 fluid pack lot # [id number]   CATARACT EXTRACTION W/PHACO Right 07/10/2016   Procedure: CATARACT EXTRACTION PHACO AND INTRAOCULAR LENS PLACEMENT ([facility]);  Surgeon: Birder Robson, MD;  Location: [facility] ORS;  Service: Ophthalmology;  Laterality: Right;  [place] 00:38 AP% 18.3 CDE 7.06 Fluid pack lot # [id number]   COLONOSCOPY  2016  EYE SURGERY     JOINT REPLACEMENT Bilateral    hip  / shoulder   OOPHORECTOMY Left    POLYPECTOMY     TOTAL HIP ARTHROPLASTY Bilateral    (2) Two   TOTAL SHOULDER REPLACEMENT      Family History  Problem Relation Age of Onset   Lung cancer Mother    Heart disease Mother    Colon cancer Mother        late 79's   Cancer Father    Colon cancer Father 31       died at 65   Breast  cancer Maternal Aunt    Breast cancer Maternal Aunt    Breast cancer Maternal Aunt    Rectal cancer Neg Hx    Stomach cancer Neg Hx    Crohn's disease Neg Hx    Esophageal cancer Neg Hx     Social History:  reports that she has never smoked. She has never used smokeless tobacco. She reports that she does not drink alcohol and does not use drugs.  Allergies:  Allergies  Allergen Reactions   Codeine Nausea And Vomiting    Medications: I have reviewed the patient's current medications.  Results for orders placed or performed during the hospital encounter of 10/29/20 (from the past 48 hour(s))  Lactic acid, plasma     Status: None   Collection Time: 10/29/20  4:37 PM  Result Value Ref Range   Lactic Acid, Venous 0.9 0.5 - 1.9 mmol/L    Comment: Performed at [facility], [address], [place], [place] [address]  Type and screen [place]     Status: None   Collection Time: 10/29/20  9:48 PM  Result Value Ref Range   ABO/RH(D) A NEG    Antibody Screen NEG    Sample Expiration      11/01/2020,2359 Performed at [facility], [place] [address], [place], [place] [address]   Magnesium     Status: None   Collection Time: 10/29/20  9:49 PM  Result Value Ref Range   Magnesium 1.9 1.7 - 2.4 mg/dL    Comment: Performed at [facility], [place] [address], [place], [place] [address]  Phosphorus     Status: None   Collection Time: 10/29/20  9:49 PM  Result Value Ref Range   Phosphorus 3.3 2.5 - 4.6 mg/dL    Comment: Performed at [facility], [place] [address], [place], [place] [address]  CBC     Status: Abnormal   Collection Time: 10/29/20  9:49 PM  Result Value Ref Range   WBC 6.7 4.0 - 10.5 K/uL   RBC 2.68 (L) 3.87 - 5.11 MIL/uL   Hemoglobin 8.4 (L) 12.0 - 15.0 g/dL   HCT 25.8 (L) 36.0 - 46.0 %   MCV 96.3 80.0 - 100.0 fL   MCH 31.3 26.0 - 34.0 pg   MCHC 32.6 30.0 - 36.0 g/dL   RDW 14.6  11.5 - 15.5 %   Platelets 325 150 - 400 K/uL   nRBC 0.0 0.0 - 0.2 %    Comment: Performed at [facility] [place], [place] [address] [place], [place] [address]  CK     Status: Abnormal   Collection Time: 10/29/20  9:49 PM  Result Value Ref Range   Total CK 15 (L) 38 - 234 U/L    Comment: Performed at [facility], [place] [address] [place], [place] [address]  C-reactive  protein     Status: Abnormal   Collection Time: 10/29/20  9:49 PM  Result Value Ref Range   CRP 4.7 (H) <1.0 mg/dL    Comment: Performed at [facility], [place] [address] [place], [place] [address]  Sedimentation rate     Status: Abnormal   Collection Time: 10/29/20  9:49 PM  Result Value Ref Range   Sed Rate 65 (H) 0 - 22 mm/hr    Comment: Performed at [facility], [place] [address] [place], [place] [address]  Hemoglobin A1c     Status: Abnormal   Collection Time: 10/29/20  9:49 PM  Result Value Ref Range   Hgb A1c MFr Bld 4.6 (L) 4.8 - 5.6 %    Comment: (NOTE) Pre diabetes:          5.7%-6.4%  Diabetes:              >6.4%  Glycemic control for   <7.0% adults with diabetes    Mean Plasma Glucose 85.32 mg/dL    Comment: Performed at [place] [address] [place], [place] [address]  Vitamin B12     Status: None   Collection Time: 10/30/20  6:27 AM  Result Value Ref Range   Vitamin B-12 481 180 - 914 pg/mL    Comment: (NOTE) This assay is not validated for testing neonatal or myeloproliferative syndrome specimens for Vitamin B12 levels. Performed at [facility], [place] [address] [place], [place] [address]   Folate     Status: None   Collection Time: 10/30/20  6:27 AM  Result Value Ref Range   Folate 16.5 >5.9 ng/mL    Comment: Performed at [facility], [place] [address], [place], [place] [address]  Iron and TIBC     Status: Abnormal   Collection Time: 10/30/20  6:27 AM  Result Value Ref Range    Iron 18 (L) 28 - 170 ug/dL   TIBC 223 (L) 250 - 450 ug/dL   Saturation Ratios 8 (L) 10.4 - 31.8 %   UIBC 205 ug/dL    Comment: Performed at [facility], [place] [address], [place], [place] [address]  Ferritin     Status: None   Collection Time: 10/30/20  6:27 AM  Result Value Ref Range   Ferritin 173 11 - 307 ng/mL    Comment: Performed at [facility], [place] [address] [place], [place] [address]  Reticulocytes     Status: Abnormal   Collection Time: 10/30/20  6:27 AM  Result Value Ref Range   Retic Ct Pct 5.6 (H) 0.4 - 3.1 %   RBC. 2.43 (L) 3.87 - 5.11 MIL/uL   Retic Count, Absolute 135.6 19.0 - 186.0 K/uL   Immature Retic Fract 26.3 (H) 2.3 - 15.9 %    Comment: Performed at [facility], [place] [address] [place], [place] [address]  Magnesium     Status: None   Collection Time: 10/30/20  6:27 AM  Result Value Ref Range   Magnesium 1.8 1.7 - 2.4 mg/dL    Comment: Performed at [facility], [place] [address] [place], [place] [address]  Phosphorus     Status: None   Collection Time: 10/30/20  6:27 AM  Result Value Ref Range   Phosphorus 3.2 2.5 - 4.6 mg/dL    Comment: Performed at [facility], [place] [address] [place], [place] [address]  CBC WITH DIFFERENTIAL  Status: Abnormal   Collection Time: 10/30/20  6:27 AM  Result Value Ref Range   WBC 4.8 4.0 - 10.5 K/uL   RBC 2.42 (L) 3.87 - 5.11 MIL/uL   Hemoglobin 7.6 (L) 12.0 - 15.0 g/dL   HCT 23.4 (L) 36.0 - 46.0 %   MCV 96.7 80.0 - 100.0 fL   MCH 31.4 26.0 - 34.0 pg   MCHC 32.5 30.0 - 36.0 g/dL   RDW 14.7 11.5 - 15.5 %   Platelets 306 150 - 400 K/uL   nRBC 0.0 0.0 - 0.2 %   Neutrophils Relative % 59 %   Neutro Abs 2.9 1.7 - 7.7 K/uL   Lymphocytes Relative 23 %   Lymphs Abs 1.1 0.7 - 4.0 K/uL   Monocytes Relative 13 %   Monocytes Absolute 0.6 0.1 - 1.0 K/uL   Eosinophils Relative 4 %   Eosinophils Absolute 0.2 0.0 - 0.5 K/uL    Basophils Relative 1 %   Basophils Absolute 0.0 0.0 - 0.1 K/uL   Immature Granulocytes 0 %   Abs Immature Granulocytes 0.01 0.00 - 0.07 K/uL    Comment: Performed at [facility], [place] [address], [place], [place] [address]  TSH     Status: None   Collection Time: 10/30/20  6:27 AM  Result Value Ref Range   TSH 1.147 0.350 - 4.500 uIU/mL    Comment: Performed by a 3rd Generation assay with a functional sensitivity of <=0.01 uIU/mL. Performed at [facility], [place] [address] [place] [address], [place], [place] [address]   Comprehensive metabolic panel     Status: Abnormal   Collection Time: 10/30/20  6:27 AM  Result Value Ref Range   Sodium 139 135 - 145 mmol/L   Potassium 4.5 3.5 - 5.1 mmol/L   Chloride 106 98 - 111 mmol/L   CO2 27 22 - 32 mmol/L   Glucose, Bld 106 (H) 70 - 99 mg/dL    Comment: Glucose reference range applies only to samples taken after fasting for at least 8 hours.   BUN 13 8 - 23 mg/dL   Creatinine, Ser 0.50 0.44 - 1.00 mg/dL   Calcium 8.0 (L) 8.9 - 10.3 mg/dL   Total Protein 5.2 (L) 6.5 - 8.1 g/dL   Albumin 2.2 (L) 3.5 - 5.0 g/dL   AST 12 (L) 15 - 41 U/L   ALT 9 0 - 44 U/L   Alkaline Phosphatase 45 38 - 126 U/L   Total Bilirubin 0.6 0.3 - 1.2 mg/dL   GFR, Estimated >60 >60 mL/min    Comment: (NOTE) Calculated using the CKD-EPI Creatinine Equation (2021)    Anion gap 6 5 - 15    Comment: Performed at [facility], [place] [address], [place], [place] [address]  Prealbumin     Status: Abnormal   Collection Time: 10/30/20  6:27 AM  Result Value Ref Range   Prealbumin 8.7 (L) 18 - 38 mg/dL    Comment: Performed at [facility], [place] [address], [place], [place] [id number]  Basic metabolic panel     Status: Abnormal   Collection Time: 10/31/20  4:45 AM  Result Value Ref Range   Sodium 138 135 - 145 mmol/L   Potassium 3.7 3.5 - 5.1 mmol/L    Comment: DELTA CHECK NOTED   Chloride 106 98 - 111 mmol/L   CO2  26 22 - 32 mmol/L   Glucose, Bld 105 (H) 70 - 99 mg/dL  Comment: Glucose reference range applies only to samples taken after fasting for at least 8 hours.   BUN 14 8 - 23 mg/dL   Creatinine, Ser 0.52 0.44 - 1.00 mg/dL   Calcium 7.9 (L) 8.9 - 10.3 mg/dL   GFR, Estimated >60 >60 mL/min    Comment: (NOTE) Calculated using the CKD-EPI Creatinine Equation (2021)    Anion gap 6 5 - 15    Comment: Performed at [facility], [place] [address] [place], [place] [address]  CBC     Status: Abnormal   Collection Time: 10/31/20  4:45 AM  Result Value Ref Range   WBC 4.6 4.0 - 10.5 K/uL   RBC 2.61 (L) 3.87 - 5.11 MIL/uL   Hemoglobin 7.8 (L) 12.0 - 15.0 g/dL   HCT 25.2 (L) 36.0 - 46.0 %   MCV 96.6 80.0 - 100.0 fL   MCH 29.9 26.0 - 34.0 pg   MCHC 31.0 30.0 - 36.0 g/dL   RDW 14.6 11.5 - 15.5 %   Platelets 344 150 - 400 K/uL   nRBC 0.0 0.0 - 0.2 %    Comment: Performed at [facility], [place] [address] [place], [place] [address]  HIV Antibody (routine testing w rflx)     Status: None   Collection Time: 10/31/20  4:45 AM  Result Value Ref Range   HIV Screen 4th Generation wRfx Non Reactive Non Reactive    Comment: Performed at [facility], [place] [address] [place], [place] [address]  Sedimentation rate     Status: Abnormal   Collection Time: 10/31/20 11:02 AM  Result Value Ref Range   Sed Rate 48 (H) 0 - 22 mm/hr    Comment: Performed at [facility], [place] [address], [place], [place] [address]  C-reactive protein     Status: Abnormal   Collection Time: 10/31/20 11:02 AM  Result Value Ref Range   CRP 4.2 (H) <1.0 mg/dL    Comment: Performed at [facility], [place] [address] [place], [place] [address]    MR FOOT RIGHT W WO CONTRAST  Result Date: 10/30/2020 CLINICAL DATA:  Chronic nonhealing right foot ulcer anterolaterally along the foot and ankle. EXAM: MRI OF THE RIGHT HINDFOOT WITHOUT AND WITH CONTRAST  TECHNIQUE: Multiplanar, multisequence MR imaging of the ankle was performed. No intravenous contrast was administered. COMPARISON:  Radiographs 10/29/2020 and CT scan from 10/08/2020 FINDINGS: TENDONS Peroneal: Longitudinal tearing of the peroneus brevis in the vicinity of the lateral malleolus. Posteromedial: Distal tibialis posterior tenosynovitis and tendinopathy, correlate clinically in assessing for tibialis posterior dysfunction. Anterior: Grossly unremarkable Achilles: Unremarkable Plantar Fascia: Unremarkable LIGAMENTS Lateral: Unremarkable Medial: Unremarkable CARTILAGE Ankle Joint: No tibiotalar joint effusion or specific lesion along the plafond or talar dome. Subtalar Joints/Sinus Tarsi: Effusion of the posterior subtalar joint extending posteriorly and into the sinus tarsi. No substantial marrow edema along the joint to suggest septic arthritis. Bones: No significant degree of marrow edema or enhancement indicate osteomyelitis involving the hindfoot, midfoot, or proximal metatarsals. Other: Very large necrotic ulceration of the anterolateral distal calf, ankle, and dorsal foot. Subtraction images in series 100 demonstrate the large irregular region of nonenhancing tissue in fluid along the margin of the extensive ulceration, with necrotic tissue extending in a flap like manner anterior to the distal tibia for example on image 1 of series 100. The proximal extent of the necrotic ulceration is not completely included, nor is the distal extent, with nonenhancing tissues extending  least as far as the MTP joints dorsally, for example on image 14 series 12. Along the margins of the large necrotic region there is a rind of enhancement favoring granulation tissue. IMPRESSION: 1. Very large necrotic ulceration involving the lower calf anterolaterally, the lateral ankle, and extending into the lateral dorsal forefoot at least as far as the MTP joints. Subtraction series 100 depicts this large region of necrosis  as nonenhancing tissue bordered by enhancing granulation tissue. 2. No compelling findings of active osteomyelitis involving the hindfoot, midfoot, or proximal metatarsals. I do not see a specific drainable contained abscess separate from the large ulceration. 3. Longitudinal tearing of the peroneus brevis tendon adjacent to the lateral malleolus. 4. Distal tibialis posterior tenosynovitis and tendinopathy. 5. Small effusion of the posterior subtalar joint, without marrow edema along the bony surfaces to further indicate septic arthritis. Electronically Signed   By: Van Clines M.D.   On: 10/30/2020 09:26   DG Foot Complete Right  Result Date: 10/29/2020 CLINICAL DATA:  Right foot wound for 1 month. EXAM: RIGHT FOOT COMPLETE - 3+ VIEW COMPARISON:  Right foot x-ray 10/07/2020. FINDINGS: There is soft tissue swelling of the entire foot. This is most significant along the dorsal aspect of the foot and ankle where there is soft tissue gas. There is no radiopaque foreign body. There is no acute fracture or dislocation. There is no periosteal reaction or cortical erosion identified. The joint spaces are maintained. IMPRESSION: 1. Soft tissue swelling and air overlying the dorsal aspect of the foot and ankle worrisome for infection. Abscess or necrotizing infection cannot be excluded. 2. No acute bony abnormality. Electronically Signed   By: Ronney Asters M.D.   On: 10/29/2020 15:21   VAS [place] ABI WITH/WO TBI  Result Date: 10/31/2020  LOWER EXTREMITY DOPPLER STUDY Patient Name:  MC GENTIL  Date of Exam:   10/31/2020 Medical Rec #: [id number]          Accession #:    [id number] Date of Birth: [birth year]          Patient Gender: F Patient Age:   80 years Exam Location:  [facility] Procedure:      VAS [place] ABI WITH/WO TBI Referring Phys: Nyoka Lint DOUTOVA --------------------------------------------------------------------------------  Indications: Ulceration. High Risk Factors: None.  Limitations:  Today's exam was limited due to an open wound and bandages. Comparison Study: No prior studies. Performing Technologist: Carlos Levering RVT  Examination Guidelines: A complete evaluation includes at minimum, Doppler waveform signals and systolic blood pressure reading at the level of bilateral brachial, anterior tibial, and posterior tibial arteries, when vessel segments are accessible. Bilateral testing is considered an integral part of a complete examination. Photoelectric Plethysmograph (PPG) waveforms and toe systolic pressure readings are included as required and additional duplex testing as needed. Limited examinations for reoccurring indications may be performed as noted.  ABI Findings: +---------+------------------+-----+---------+--------+ Right    Rt Pressure (mmHg)IndexWaveform Comment  +---------+------------------+-----+---------+--------+ Brachial 140                    triphasic         +---------+------------------+-----+---------+--------+ PTA      194               1.39 biphasic          +---------+------------------+-----+---------+--------+ DP       193               1.38 biphasic          +---------+------------------+-----+---------+--------+  Great Toe84                0.60                   +---------+------------------+-----+---------+--------+ +---------+------------------+-----+---------+-------+ Left     Lt Pressure (mmHg)IndexWaveform Comment +---------+------------------+-----+---------+-------+ Brachial 135                    triphasic        +---------+------------------+-----+---------+-------+ PTA      192               1.37 triphasic        +---------+------------------+-----+---------+-------+ DP       170               1.21 triphasic        +---------+------------------+-----+---------+-------+ Great Toe116               0.83                  +---------+------------------+-----+---------+-------+  +-------+-----------+-----------+------------+------------+ ABI/TBIToday's ABIToday's TBIPrevious ABIPrevious TBI +-------+-----------+-----------+------------+------------+ Right  1.39       0.6                                 +-------+-----------+-----------+------------+------------+ Left   1.37       0.83                                +-------+-----------+-----------+------------+------------+  Summary: Right: Resting right ankle-brachial index indicates noncompressible right lower extremity arteries. The right toe-brachial index is abnormal. Left: Resting left ankle-brachial index indicates noncompressible left lower extremity arteries. The left toe-brachial index is normal.  *See table(s) above for measurements and observations.     Preliminary     Review of Systems  Constitutional:  Negative for chills and fever.  Musculoskeletal:  Negative for myalgias.  Skin:  Positive for rash.  Neurological:  Positive for sensory change.  Blood pressure 128/71, pulse 78, temperature 99.1 F (37.3 C), temperature source Oral, resp. rate 18, height '5\' 3"'$  (1.6 m), weight 64.9 kg, SpO2 98 %. Physical Exam Constitutional:      General: She is not in acute distress.    Appearance: Normal appearance. She is not ill-appearing.  Cardiovascular:     Rate and Rhythm: Normal rate.     Pulses: Normal pulses.  Pulmonary:     Effort: Pulmonary effort is normal.  Musculoskeletal:        General: No tenderness. Normal range of motion.     Cervical back: Normal range of motion.  Skin:    Findings: Lesion present.     Comments: Right lower leg: Approximately 16 x 10 cm x 1.5 cm irregular wound over area of lateral malleolus with overlying eschar.  Dry appearing.  No drainage.  Surrounding erythema and induration that is warm to the touch.  Pedal pulse intact and symmetric with contralateral foot.  Sensation loss over the eschar, but sensation intact around wound.  ROM of ankle intact, with good  strength against resistance.  Can wiggle toes.  Not malodorous.  Neurological:     Mental Status: She is alert and oriented to person, place, and time.     Cranial Nerves: No cranial nerve deficit.      Assessment/Plan:  Patient with subacute ulcerative wound to right lower extremity over area of lateral malleolus.  Overlying eschar.  Surrounding skin is warm to the touch, erythematous, and mildly indurated.  No purulent discharge.  Neurovascularly and functionally intact.  Patient without systemic symptoms.  She is largely asymptomatic and she denies any associated pains or fevers.  ID and autoimmune work-up is pending.  BKA tentatively scheduled for tomorrow with Dr. Doran Durand, but patient continued to express throughout my examination that she would prefer to avoid if at all possible.    Spoke with Dr. Claudia Desanctis.  He is willing to try debridement with Integra and wound VAC placement tomorrow if possible with OR schedule.  Can hold off on BKA for now.    Wound was dressed with Xeroform gauze, nonstick pad, and overlying Kerlix wrap.  Krista Blue 10/31/2020, 2:41 PM

## 2020-10-31 NOTE — H&P (View-Only) (Signed)
Reason for Consult: Right ankle wound Referring Physician: Dr. Coral Else [name] Price is an 80 y.o. female.  HPI: Patient is a pleasant 80 year old female with no significant past medical history who is currently admitted for right foot wound.    Reviewed patient's medical record and she had previously been admitted from 10/08/2020 for sepsis in context of her right foot cellulitis with hemorrhagic blister.  Orthopedics were consulted and Dr. Alvan Dame lanced blister at bedside 10/09/2020, drained 60-80 cc clear, noninfectious, non-hemorrhagic fluid.  Patient continued to experience worsening erythema during hospitalization for which she was given IV vancomycin and cefepime.  She was ultimately discharged on 10/19/2020 with home health nurse arranged by TOC.  Patient then returned to the ED on 10/29/2020 for worsening right foot wound despite outpatient antibiotics and home health aide.  She was not septic appearing, but plain films were concerning for possible necrotizing fasciitis involving the ankle and Dr. Rolena Infante, orthopedics, recommended admission for MRI and possible BKA.  I reviewed MRI which did not reveal any evidence of acute osteomyelitis.  ID was consulted, differential is nontuberculous mycobacteria versus autoimmune phenomenon.  ANCA is pending.  Inflammatory markers downtrending since admission.  CBC without leukocytosis.  On my examination, she denies any history of DM, tobacco use, or arterial insufficiency.  No history of prior wounds/ulcerations.  Patient states that she lives at home with her husband who is able to help care for her.  She also states that her daughter was at home assisting with dressing changes prior to her most recent admission.  She has a couple of dogs at home who she walks regularly.  She states that she is an active 80 year old she does not have any significant medical history.  She is frustrated because she is never sick and now there is the possibility that she will  have a BKA.  She understands why it might be necessary, but only wants it as an absolute last resort.  Patient reports that the skin adjacent to her wound has been hot to the touch for the past 4 to 5 weeks and feels as though her erythema worsened since discharge home.  She denies any significant right lower extremity discomfort, recent fevers or chills, or recent purulent discharge.  Patient also reports that her swelling has improved considerably during current admission..  Past Medical History:  Diagnosis Date   Allergy    Anxiety    Arthritis    Blood transfusion without reported diagnosis    as baby   Breast cancer ([place]) 1999-2000   s/p lumpectomy and radiation, no chemo-right   History of chicken pox    History of hip replacement    Hot flashes    treated wtih paxil   Hyperlipidemia    Osteoporosis    DXA 11/12/17   UTI (urinary tract infection)     Past Surgical History:  Procedure Laterality Date   BREAST SURGERY Right    CATARACT EXTRACTION W/PHACO Left 12/28/2014   Procedure: CATARACT EXTRACTION PHACO AND INTRAOCULAR LENS PLACEMENT ([place]);  Surgeon: Birder Robson, MD;  Location: [facility] ORS;  Service: Ophthalmology;  Laterality: Left;  [place] 00:38 AP% 19.9 CDE 7.70 fluid pack lot # [id number]   CATARACT EXTRACTION W/PHACO Right 07/10/2016   Procedure: CATARACT EXTRACTION PHACO AND INTRAOCULAR LENS PLACEMENT ([facility]);  Surgeon: Birder Robson, MD;  Location: [facility] ORS;  Service: Ophthalmology;  Laterality: Right;  [place] 00:38 AP% 18.3 CDE 7.06 Fluid pack lot # [id number]   COLONOSCOPY  2016  EYE SURGERY     JOINT REPLACEMENT Bilateral    hip  / shoulder   OOPHORECTOMY Left    POLYPECTOMY     TOTAL HIP ARTHROPLASTY Bilateral    (2) Two   TOTAL SHOULDER REPLACEMENT      Family History  Problem Relation Age of Onset   Lung cancer Mother    Heart disease Mother    Colon cancer Mother        late 40's   Cancer Father    Colon cancer Father 7       died at 27   Breast  cancer Maternal Aunt    Breast cancer Maternal Aunt    Breast cancer Maternal Aunt    Rectal cancer Neg Hx    Stomach cancer Neg Hx    Crohn's disease Neg Hx    Esophageal cancer Neg Hx     Social History:  reports that she has never smoked. She has never used smokeless tobacco. She reports that she does not drink alcohol and does not use drugs.  Allergies:  Allergies  Allergen Reactions   Codeine Nausea And Vomiting    Medications: I have reviewed the patient's current medications.  Results for orders placed or performed during the hospital encounter of 10/29/20 (from the past 48 hour(s))  Lactic acid, plasma     Status: None   Collection Time: 10/29/20  4:37 PM  Result Value Ref Range   Lactic Acid, Venous 0.9 0.5 - 1.9 mmol/L    Comment: Performed at [facility], [address] [place], [place] [address]  Type and screen [place]     Status: None   Collection Time: 10/29/20  9:48 PM  Result Value Ref Range   ABO/RH(D) A NEG    Antibody Screen NEG    Sample Expiration      11/01/2020,2359 Performed at [facility], [place] [address] [place], [place] [address]   Magnesium     Status: None   Collection Time: 10/29/20  9:49 PM  Result Value Ref Range   Magnesium 1.9 1.7 - 2.4 mg/dL    Comment: Performed at [facility], [place] [address], [place], [place] [address]  Phosphorus     Status: None   Collection Time: 10/29/20  9:49 PM  Result Value Ref Range   Phosphorus 3.3 2.5 - 4.6 mg/dL    Comment: Performed at [facility], [place] [address] [place], [place] [address]  CBC     Status: Abnormal   Collection Time: 10/29/20  9:49 PM  Result Value Ref Range   WBC 6.7 4.0 - 10.5 K/uL   RBC 2.68 (L) 3.87 - 5.11 MIL/uL   Hemoglobin 8.4 (L) 12.0 - 15.0 g/dL   HCT 25.8 (L) 36.0 - 46.0 %   MCV 96.3 80.0 - 100.0 fL   MCH 31.3 26.0 - 34.0 pg   MCHC 32.6 30.0 - 36.0 g/dL   RDW 14.6  11.5 - 15.5 %   Platelets 325 150 - 400 K/uL   nRBC 0.0 0.0 - 0.2 %    Comment: Performed at [facility], [place] [address] [place], [place] [address]  CK     Status: Abnormal   Collection Time: 10/29/20  9:49 PM  Result Value Ref Range   Total CK 15 (L) 38 - 234 U/L    Comment: Performed at [facility], [place] [address], [place], [place] [address]  C-reactive  protein     Status: Abnormal   Collection Time: 10/29/20  9:49 PM  Result Value Ref Range   CRP 4.7 (H) <1.0 mg/dL    Comment: Performed at [facility], [place] [address], [place], [place] [address]  Sedimentation rate     Status: Abnormal   Collection Time: 10/29/20  9:49 PM  Result Value Ref Range   Sed Rate 65 (H) 0 - 22 mm/hr    Comment: Performed at [facility], [place] [address] [place], [place] [address]  Hemoglobin A1c     Status: Abnormal   Collection Time: 10/29/20  9:49 PM  Result Value Ref Range   Hgb A1c MFr Bld 4.6 (L) 4.8 - 5.6 %    Comment: (NOTE) Pre diabetes:          5.7%-6.4%  Diabetes:              >6.4%  Glycemic control for   <7.0% adults with diabetes    Mean Plasma Glucose 85.32 mg/dL    Comment: Performed at [place] [address] [place], [place] [address]  Vitamin B12     Status: None   Collection Time: 10/30/20  6:27 AM  Result Value Ref Range   Vitamin B-12 481 180 - 914 pg/mL    Comment: (NOTE) This assay is not validated for testing neonatal or myeloproliferative syndrome specimens for Vitamin B12 levels. Performed at [facility], [place] [address], [place], [place] [address]   Folate     Status: None   Collection Time: 10/30/20  6:27 AM  Result Value Ref Range   Folate 16.5 >5.9 ng/mL    Comment: Performed at [facility], [place] [address], [place], [place] [address]  Iron and TIBC     Status: Abnormal   Collection Time: 10/30/20  6:27 AM  Result Value Ref Range    Iron 18 (L) 28 - 170 ug/dL   TIBC 223 (L) 250 - 450 ug/dL   Saturation Ratios 8 (L) 10.4 - 31.8 %   UIBC 205 ug/dL    Comment: Performed at [facility], [place] [address] [place], [place] [address]  Ferritin     Status: None   Collection Time: 10/30/20  6:27 AM  Result Value Ref Range   Ferritin 173 11 - 307 ng/mL    Comment: Performed at [facility], [place] [address], [place], [place] [address]  Reticulocytes     Status: Abnormal   Collection Time: 10/30/20  6:27 AM  Result Value Ref Range   Retic Ct Pct 5.6 (H) 0.4 - 3.1 %   RBC. 2.43 (L) 3.87 - 5.11 MIL/uL   Retic Count, Absolute 135.6 19.0 - 186.0 K/uL   Immature Retic Fract 26.3 (H) 2.3 - 15.9 %    Comment: Performed at [facility], [place] [address] [place], [place] [address]  Magnesium     Status: None   Collection Time: 10/30/20  6:27 AM  Result Value Ref Range   Magnesium 1.8 1.7 - 2.4 mg/dL    Comment: Performed at [facility], [place] [address] [place], [place] [address]  Phosphorus     Status: None   Collection Time: 10/30/20  6:27 AM  Result Value Ref Range   Phosphorus 3.2 2.5 - 4.6 mg/dL    Comment: Performed at [facility], [place] [address], [place], [place] [address]  CBC WITH DIFFERENTIAL  Status: Abnormal   Collection Time: 10/30/20  6:27 AM  Result Value Ref Range   WBC 4.8 4.0 - 10.5 K/uL   RBC 2.42 (L) 3.87 - 5.11 MIL/uL   Hemoglobin 7.6 (L) 12.0 - 15.0 g/dL   HCT 23.4 (L) 36.0 - 46.0 %   MCV 96.7 80.0 - 100.0 fL   MCH 31.4 26.0 - 34.0 pg   MCHC 32.5 30.0 - 36.0 g/dL   RDW 14.7 11.5 - 15.5 %   Platelets 306 150 - 400 K/uL   nRBC 0.0 0.0 - 0.2 %   Neutrophils Relative % 59 %   Neutro Abs 2.9 1.7 - 7.7 K/uL   Lymphocytes Relative 23 %   Lymphs Abs 1.1 0.7 - 4.0 K/uL   Monocytes Relative 13 %   Monocytes Absolute 0.6 0.1 - 1.0 K/uL   Eosinophils Relative 4 %   Eosinophils Absolute 0.2 0.0 - 0.5 K/uL    Basophils Relative 1 %   Basophils Absolute 0.0 0.0 - 0.1 K/uL   Immature Granulocytes 0 %   Abs Immature Granulocytes 0.01 0.00 - 0.07 K/uL    Comment: Performed at [facility], [place] [address] [place], [place] [address]  TSH     Status: None   Collection Time: 10/30/20  6:27 AM  Result Value Ref Range   TSH 1.147 0.350 - 4.500 uIU/mL    Comment: Performed by a 3rd Generation assay with a functional sensitivity of <=0.01 uIU/mL. Performed at [facility], [place] [address], [place], [place] [address]   Comprehensive metabolic panel     Status: Abnormal   Collection Time: 10/30/20  6:27 AM  Result Value Ref Range   Sodium 139 135 - 145 mmol/L   Potassium 4.5 3.5 - 5.1 mmol/L   Chloride 106 98 - 111 mmol/L   CO2 27 22 - 32 mmol/L   Glucose, Bld 106 (H) 70 - 99 mg/dL    Comment: Glucose reference range applies only to samples taken after fasting for at least 8 hours.   BUN 13 8 - 23 mg/dL   Creatinine, Ser 0.50 0.44 - 1.00 mg/dL   Calcium 8.0 (L) 8.9 - 10.3 mg/dL   Total Protein 5.2 (L) 6.5 - 8.1 g/dL   Albumin 2.2 (L) 3.5 - 5.0 g/dL   AST 12 (L) 15 - 41 U/L   ALT 9 0 - 44 U/L   Alkaline Phosphatase 45 38 - 126 U/L   Total Bilirubin 0.6 0.3 - 1.2 mg/dL   GFR, Estimated >60 >60 mL/min    Comment: (NOTE) Calculated using the CKD-EPI Creatinine Equation (2021)    Anion gap 6 5 - 15    Comment: Performed at [facility], [place] [address] [place], [place] [address]  Prealbumin     Status: Abnormal   Collection Time: 10/30/20  6:27 AM  Result Value Ref Range   Prealbumin 8.7 (L) 18 - 38 mg/dL    Comment: Performed at [facility], [place] [address] [place], [place] [id number]  Basic metabolic panel     Status: Abnormal   Collection Time: 10/31/20  4:45 AM  Result Value Ref Range   Sodium 138 135 - 145 mmol/L   Potassium 3.7 3.5 - 5.1 mmol/L    Comment: DELTA CHECK NOTED   Chloride 106 98 - 111 mmol/L   CO2  26 22 - 32 mmol/L   Glucose, Bld 105 (H) 70 - 99 mg/dL  Comment: Glucose reference range applies only to samples taken after fasting for at least 8 hours.   BUN 14 8 - 23 mg/dL   Creatinine, Ser 0.52 0.44 - 1.00 mg/dL   Calcium 7.9 (L) 8.9 - 10.3 mg/dL   GFR, Estimated >60 >60 mL/min    Comment: (NOTE) Calculated using the CKD-EPI Creatinine Equation (2021)    Anion gap 6 5 - 15    Comment: Performed at [facility], [place] [address], [place], [place] [address]  CBC     Status: Abnormal   Collection Time: 10/31/20  4:45 AM  Result Value Ref Range   WBC 4.6 4.0 - 10.5 K/uL   RBC 2.61 (L) 3.87 - 5.11 MIL/uL   Hemoglobin 7.8 (L) 12.0 - 15.0 g/dL   HCT 25.2 (L) 36.0 - 46.0 %   MCV 96.6 80.0 - 100.0 fL   MCH 29.9 26.0 - 34.0 pg   MCHC 31.0 30.0 - 36.0 g/dL   RDW 14.6 11.5 - 15.5 %   Platelets 344 150 - 400 K/uL   nRBC 0.0 0.0 - 0.2 %    Comment: Performed at [facility], [place] [address], [place], [place] [address]  HIV Antibody (routine testing w rflx)     Status: None   Collection Time: 10/31/20  4:45 AM  Result Value Ref Range   HIV Screen 4th Generation wRfx Non Reactive Non Reactive    Comment: Performed at [facility], [place] [address], [place], [place] [address]  Sedimentation rate     Status: Abnormal   Collection Time: 10/31/20 11:02 AM  Result Value Ref Range   Sed Rate 48 (H) 0 - 22 mm/hr    Comment: Performed at [facility], [place] [address], [place], [place] [address]  C-reactive protein     Status: Abnormal   Collection Time: 10/31/20 11:02 AM  Result Value Ref Range   CRP 4.2 (H) <1.0 mg/dL    Comment: Performed at [facility], [place] [address], [place], [place] [address]    MR FOOT RIGHT W WO CONTRAST  Result Date: 10/30/2020 CLINICAL DATA:  Chronic nonhealing right foot ulcer anterolaterally along the foot and ankle. EXAM: MRI OF THE RIGHT HINDFOOT WITHOUT AND WITH CONTRAST  TECHNIQUE: Multiplanar, multisequence MR imaging of the ankle was performed. No intravenous contrast was administered. COMPARISON:  Radiographs 10/29/2020 and CT scan from 10/08/2020 FINDINGS: TENDONS Peroneal: Longitudinal tearing of the peroneus brevis in the vicinity of the lateral malleolus. Posteromedial: Distal tibialis posterior tenosynovitis and tendinopathy, correlate clinically in assessing for tibialis posterior dysfunction. Anterior: Grossly unremarkable Achilles: Unremarkable Plantar Fascia: Unremarkable LIGAMENTS Lateral: Unremarkable Medial: Unremarkable CARTILAGE Ankle Joint: No tibiotalar joint effusion or specific lesion along the plafond or talar dome. Subtalar Joints/Sinus Tarsi: Effusion of the posterior subtalar joint extending posteriorly and into the sinus tarsi. No substantial marrow edema along the joint to suggest septic arthritis. Bones: No significant degree of marrow edema or enhancement indicate osteomyelitis involving the hindfoot, midfoot, or proximal metatarsals. Other: Very large necrotic ulceration of the anterolateral distal calf, ankle, and dorsal foot. Subtraction images in series 100 demonstrate the large irregular region of nonenhancing tissue in fluid along the margin of the extensive ulceration, with necrotic tissue extending in a flap like manner anterior to the distal tibia for example on image 1 of series 100. The proximal extent of the necrotic ulceration is not completely included, nor is the distal extent, with nonenhancing tissues extending  least as far as the MTP joints dorsally, for example on image 14 series 12. Along the margins of the large necrotic region there is a rind of enhancement favoring granulation tissue. IMPRESSION: 1. Very large necrotic ulceration involving the lower calf anterolaterally, the lateral ankle, and extending into the lateral dorsal forefoot at least as far as the MTP joints. Subtraction series 100 depicts this large region of necrosis  as nonenhancing tissue bordered by enhancing granulation tissue. 2. No compelling findings of active osteomyelitis involving the hindfoot, midfoot, or proximal metatarsals. I do not see a specific drainable contained abscess separate from the large ulceration. 3. Longitudinal tearing of the peroneus brevis tendon adjacent to the lateral malleolus. 4. Distal tibialis posterior tenosynovitis and tendinopathy. 5. Small effusion of the posterior subtalar joint, without marrow edema along the bony surfaces to further indicate septic arthritis. Electronically Signed   By: Van Clines M.D.   On: 10/30/2020 09:26   DG Foot Complete Right  Result Date: 10/29/2020 CLINICAL DATA:  Right foot wound for 1 month. EXAM: RIGHT FOOT COMPLETE - 3+ VIEW COMPARISON:  Right foot x-ray 10/07/2020. FINDINGS: There is soft tissue swelling of the entire foot. This is most significant along the dorsal aspect of the foot and ankle where there is soft tissue gas. There is no radiopaque foreign body. There is no acute fracture or dislocation. There is no periosteal reaction or cortical erosion identified. The joint spaces are maintained. IMPRESSION: 1. Soft tissue swelling and air overlying the dorsal aspect of the foot and ankle worrisome for infection. Abscess or necrotizing infection cannot be excluded. 2. No acute bony abnormality. Electronically Signed   By: Ronney Asters M.D.   On: 10/29/2020 15:21   VAS [place] ABI WITH/WO TBI  Result Date: 10/31/2020  LOWER EXTREMITY DOPPLER STUDY Patient Name:  [name] Price  Date of Exam:   10/31/2020 Medical Rec #: [id number]          Accession #:    [id number] Date of Birth: 1940-04-30          Patient Gender: F Patient Age:   27 years Exam Location:  [facility] Procedure:      VAS [place] ABI WITH/WO TBI Referring Phys: Nyoka Lint DOUTOVA --------------------------------------------------------------------------------  Indications: Ulceration. High Risk Factors: None.  Limitations:  Today's exam was limited due to an open wound and bandages. Comparison Study: No prior studies. Performing Technologist: Carlos Levering RVT  Examination Guidelines: A complete evaluation includes at minimum, Doppler waveform signals and systolic blood pressure reading at the level of bilateral brachial, anterior tibial, and posterior tibial arteries, when vessel segments are accessible. Bilateral testing is considered an integral part of a complete examination. Photoelectric Plethysmograph (PPG) waveforms and toe systolic pressure readings are included as required and additional duplex testing as needed. Limited examinations for reoccurring indications may be performed as noted.  ABI Findings: +---------+------------------+-----+---------+--------+ Right    Rt Pressure (mmHg)IndexWaveform Comment  +---------+------------------+-----+---------+--------+ Brachial 140                    triphasic         +---------+------------------+-----+---------+--------+ PTA      194               1.39 biphasic          +---------+------------------+-----+---------+--------+ DP       193               1.38 biphasic          +---------+------------------+-----+---------+--------+  Great Toe84                0.60                   +---------+------------------+-----+---------+--------+ +---------+------------------+-----+---------+-------+ Left     Lt Pressure (mmHg)IndexWaveform Comment +---------+------------------+-----+---------+-------+ Brachial 135                    triphasic        +---------+------------------+-----+---------+-------+ PTA      192               1.37 triphasic        +---------+------------------+-----+---------+-------+ DP       170               1.21 triphasic        +---------+------------------+-----+---------+-------+ Great Toe116               0.83                  +---------+------------------+-----+---------+-------+  +-------+-----------+-----------+------------+------------+ ABI/TBIToday's ABIToday's TBIPrevious ABIPrevious TBI +-------+-----------+-----------+------------+------------+ Right  1.39       0.6                                 +-------+-----------+-----------+------------+------------+ Left   1.37       0.83                                +-------+-----------+-----------+------------+------------+  Summary: Right: Resting right ankle-brachial index indicates noncompressible right lower extremity arteries. The right toe-brachial index is abnormal. Left: Resting left ankle-brachial index indicates noncompressible left lower extremity arteries. The left toe-brachial index is normal.  *See table(s) above for measurements and observations.     Preliminary     Review of Systems  Constitutional:  Negative for chills and fever.  Musculoskeletal:  Negative for myalgias.  Skin:  Positive for rash.  Neurological:  Positive for sensory change.  Blood pressure 128/71, pulse 78, temperature 99.1 F (37.3 C), temperature source Oral, resp. rate 18, height '5\' 3"'$  (1.6 m), weight 64.9 kg, SpO2 98 %. Physical Exam Constitutional:      General: She is not in acute distress.    Appearance: Normal appearance. She is not ill-appearing.  Cardiovascular:     Rate and Rhythm: Normal rate.     Pulses: Normal pulses.  Pulmonary:     Effort: Pulmonary effort is normal.  Musculoskeletal:        General: No tenderness. Normal range of motion.     Cervical back: Normal range of motion.  Skin:    Findings: Lesion present.     Comments: Right lower leg: Approximately 16 x 10 cm x 1.5 cm irregular wound over area of lateral malleolus with overlying eschar.  Dry appearing.  No drainage.  Surrounding erythema and induration that is warm to the touch.  Pedal pulse intact and symmetric with contralateral foot.  Sensation loss over the eschar, but sensation intact around wound.  ROM of ankle intact, with good  strength against resistance.  Can wiggle toes.  Not malodorous.  Neurological:     Mental Status: She is alert and oriented to person, place, and time.     Cranial Nerves: No cranial nerve deficit.      Assessment/Plan:  Patient with subacute ulcerative wound to right lower extremity over area of lateral malleolus.  Overlying eschar.  Surrounding skin is warm to the touch, erythematous, and mildly indurated.  No purulent discharge.  Neurovascularly and functionally intact.  Patient without systemic symptoms.  She is largely asymptomatic and she denies any associated pains or fevers.  ID and autoimmune work-up is pending.  BKA tentatively scheduled for tomorrow with Dr. Doran Durand, but patient continued to express throughout my examination that she would prefer to avoid if at all possible.    Spoke with Dr. Claudia Desanctis.  He is willing to try debridement with Integra and wound VAC placement tomorrow if possible with OR schedule.  Can hold off on BKA for now.    Wound was dressed with Xeroform gauze, nonstick pad, and overlying Kerlix wrap.  Krista Blue 10/31/2020, 2:41 PM

## 2020-10-31 NOTE — Progress Notes (Signed)
Initial Nutrition Assessment  INTERVENTION:   -Ensure Surgery PO BID, each provides 330 kcals and 18g protein   -Multivitamin with minerals daily  -Vitamin C 250 mg BID  NUTRITION DIAGNOSIS:   Increased nutrient needs related to wound healing as evidenced by estimated needs.  GOAL:   Patient will meet greater than or equal to 90% of their needs  MONITOR:   PO intake, Supplement acceptance, Labs, Weight trends, I & O's, Diet advancement  REASON FOR ASSESSMENT:   Consult Wound healing  ASSESSMENT:   80 year old female with history of depression, hypertension, but otherwise fairly healthy who comes into the hospital with persistent right ankle cellulitis.  She was recently hospitalized 8/27-9/7 for right ankle cellulitis.  Patient with good appetite and eating 75-100% of meals at that time. Pt with extensive right ankle, leg and foot infection, non-healing and necrotic. Per chart review, plan pending ortho and plastic surgery consults. Possible surgery needed.  Will order Ensure Surgery supplements for additional kcals and protein. Will likely benefit from Juven supplements but currently on backorder.  Per weight records, pt has lost 12 lbs since 8/27 (7% wt loss x  3 weeks, significant for time frame).   Per nursing documentation, pt with moderate RLE edema.   Medications: [employer] reviewed.  NUTRITION - FOCUSED PHYSICAL EXAM:  Deferred.  Diet Order:   Diet Order             Diet regular Room service appropriate? Yes; Fluid consistency: Thin  Diet effective now                   EDUCATION NEEDS:   No education needs have been identified at this time  Skin:  Skin Assessment: Skin Integrity Issues: Skin Integrity Issues:: Incisions Incisions: 9/2 rt foot  Last BM:  9/19  Height:   Ht Readings from Last 1 Encounters:  10/29/20 '5\' 3"'$  (1.6 m)    Weight:   Wt Readings from Last 1 Encounters:  10/29/20 64.9 kg    BMI:  Body mass index is  25.33 kg/m.  Estimated Nutritional Needs:   Kcal:  1800-2000  Protein:  85-100g  Fluid:  2L/day   Clayton Bibles, MS, RD, LDN Inpatient Clinical Dietitian Contact information available via Amion

## 2020-10-31 NOTE — Progress Notes (Addendum)
Subjective: Asked by Dr. Rolena Infante to see the patient for her right ankle wound.  I have reviewed her notes, media, CV studies and imaging.  She c/o progression of her leg wound despite abx and wound care.  She denies any significant pain.    Objective: Vital signs in last 24 hours: Temp:  [98.2 F (36.8 C)-98.9 F (37.2 C)] 98.2 F (36.8 C) (09/19 0643) Pulse Rate:  [77-109] 77 (09/19 0643) Resp:  [17] 17 (09/19 0643) BP: (117-131)/(51-71) 131/51 (09/19 0643) SpO2:  [96 %-99 %] 99 % (09/19 0643)  Intake/Output from previous day: 09/18 0701 - 09/19 0700 In: 1272 [P.O.:1072; IV Piggyback:200] Out: 900 [Urine:900] Intake/Output this shift: No intake/output data recorded.  Recent Labs    10/29/20 1441 10/29/20 2149 10/30/20 0627 10/31/20 0445  HGB 8.5* 8.4* 7.6* 7.8*   Recent Labs    10/30/20 0627 10/31/20 0445  WBC 4.8 4.6  RBC 2.42*  2.43* 2.61*  HCT 23.4* 25.2*  PLT 306 344   Recent Labs    10/30/20 0627 10/31/20 0445  NA 139 138  K 4.5 3.7  CL 106 106  CO2 27 26  BUN 13 14  CREATININE 0.50 0.52  GLUCOSE 106* 105*  CALCIUM 8.0* 7.9*   No results for input(s): LABPT, INR in the last 72 hours.  44 wd female in n ad.  A and o. Eomi.  Resp unlabored.  R leg, andkl and foot with large area of full thickness skin necrosis.  Intact sens to LT distal to the wound.  Eschar is stable.  Brisk cap refill at the toes.  No lympadenopathy.  Active PF and DF of the ankle and toes.  Soft tissue surrounding the wound is somewhat swollens and erythematous.    Assessment/Plan: R leg, ankle and foot soft tissue necrosis.  Operative treatment with debridement of the eschar will create a large defect with significant bone and tendon exposure.  This will require coverage outside of my expertise.  I have asked Dr. Marla Roe to see the patient in consultation for coverage options.  I believe below knee amputation is a viable treatment option as an alternative.  If the patient is  transferred to a tertiary care center, then I would defer to the expertise of the ortho and plastic teams there.  I'll leave her on the OR schedule for tomorrow pending Dr. Eusebio Friendly eval     Wylene Simmer 10/31/2020, 1:03 PM    Addendum:  I spoke with Dr. Renne Crigler, Dr. Tommy Medal, Dr. Marla Roe and her PA.  Dr. Tommy Medal requests biopsy of the wound and cultures.  Dr. Marla Roe is unavailable to take the pt to the OR and requested that her partner Dr. Claudia Desanctis take over.  He evaluated the patient and is planning to take her to the OR on 9/20 for I and D and application of acellular dermal matrix in an effort to spare her leg.  I cancelled her BKA scheduled for 9/20.  I spent more than 90 minutes seeing and evaluating the patient, formulating the treatment plan, speaking with 4 other doctors and documenting the visit.

## 2020-10-31 NOTE — Progress Notes (Signed)
ABI's have been completed. Preliminary results can be found in CV Proc through chart review.   10/31/20 9:39 AM [name] Price RVT

## 2020-11-01 ENCOUNTER — Inpatient Hospital Stay: Admit: 2020-11-01 | Payer: Medicare Other | Admitting: Plastic Surgery

## 2020-11-01 ENCOUNTER — Inpatient Hospital Stay (HOSPITAL_COMMUNITY): Payer: Medicare Other | Admitting: Anesthesiology

## 2020-11-01 ENCOUNTER — Encounter (HOSPITAL_COMMUNITY)
Admission: EM | Disposition: A | Discharge status: Home / Self Care | Payer: Self-pay | Source: Home / Self Care | Attending: Internal Medicine

## 2020-11-01 ENCOUNTER — Encounter (HOSPITAL_COMMUNITY): Payer: Self-pay | Admitting: Internal Medicine

## 2020-11-01 DIAGNOSIS — I10 Essential (primary) hypertension: Secondary | ICD-10-CM | POA: Diagnosis not present

## 2020-11-01 DIAGNOSIS — L089 Local infection of the skin and subcutaneous tissue, unspecified: Secondary | ICD-10-CM | POA: Diagnosis not present

## 2020-11-01 DIAGNOSIS — L97511 Non-pressure chronic ulcer of other part of right foot limited to breakdown of skin: Secondary | ICD-10-CM

## 2020-11-01 DIAGNOSIS — S91301A Unspecified open wound, right foot, initial encounter: Secondary | ICD-10-CM | POA: Diagnosis not present

## 2020-11-01 HISTORY — PX: DEBRIDEMENT AND CLOSURE WOUND: SHX5614

## 2020-11-01 SURGERY — AMPUTATION BELOW KNEE
Anesthesia: General | Site: Knee | Laterality: Right

## 2020-11-01 SURGERY — DEBRIDEMENT, WOUND, WITH CLOSURE
Anesthesia: General | Site: Ankle | Laterality: Right

## 2020-11-01 MED ORDER — 0.9 % SODIUM CHLORIDE (POUR BTL) OPTIME
TOPICAL | Status: DC | PRN
  Administered 2020-11-01: 1000 mL

## 2020-11-01 MED ORDER — EPHEDRINE SULFATE-NACL 50-0.9 MG/10ML-% IV SOSY
PREFILLED_SYRINGE | INTRAVENOUS | Status: DC | PRN
  Administered 2020-11-01: 10 mg via INTRAVENOUS
  Administered 2020-11-01: 5 mg via INTRAVENOUS
  Administered 2020-11-01: 10 mg via INTRAVENOUS

## 2020-11-01 MED ORDER — PROPOFOL 10 MG/ML IV BOLUS
INTRAVENOUS | Status: DC | PRN
  Administered 2020-11-01: 100 mg via INTRAVENOUS
  Administered 2020-11-01: 50 mg via INTRAVENOUS

## 2020-11-01 MED ORDER — ALBUMIN HUMAN 5 % IV SOLN: INTRAVENOUS | Status: DC | PRN

## 2020-11-01 MED ORDER — CHLORHEXIDINE GLUCONATE 0.12 % MT SOLN
15.0000 mL | Freq: Once | OROMUCOSAL | Status: AC
  Administered 2020-11-01: 15 mL via OROMUCOSAL

## 2020-11-01 MED ORDER — SODIUM CHLORIDE 0.9 % IR SOLN
Status: DC | PRN
  Administered 2020-11-01: 3000 mL

## 2020-11-01 MED ORDER — FENTANYL CITRATE (PF) 100 MCG/2ML IJ SOLN
INTRAMUSCULAR | Status: AC
  Filled 2020-11-01: qty 2

## 2020-11-01 MED ORDER — PHENYLEPHRINE 40 MCG/ML (10ML) SYRINGE FOR IV PUSH (FOR BLOOD PRESSURE SUPPORT)
PREFILLED_SYRINGE | INTRAVENOUS | Status: DC | PRN
  Administered 2020-11-01: 120 ug via INTRAVENOUS
  Administered 2020-11-01 (×3): 80 ug via INTRAVENOUS

## 2020-11-01 MED ORDER — LACTATED RINGERS IV SOLN: INTRAVENOUS | Status: DC

## 2020-11-01 MED ORDER — BUPIVACAINE-EPINEPHRINE 0.25% -1:200000 IJ SOLN
INTRAMUSCULAR | Status: AC
  Filled 2020-11-01: qty 1

## 2020-11-01 MED ORDER — LIDOCAINE 2% (20 MG/ML) 5 ML SYRINGE
INTRAMUSCULAR | Status: DC | PRN
  Administered 2020-11-01: 60 mg via INTRAVENOUS

## 2020-11-01 MED ORDER — LIDOCAINE HCL (PF) 2 % IJ SOLN
INTRAMUSCULAR | Status: AC
  Filled 2020-11-01: qty 5

## 2020-11-01 MED ORDER — PROPOFOL 10 MG/ML IV BOLUS
INTRAVENOUS | Status: AC
  Filled 2020-11-01: qty 20

## 2020-11-01 MED ORDER — CHLORHEXIDINE GLUCONATE 4 % EX LIQD: 60.0000 mL | Freq: Once | CUTANEOUS | Status: DC

## 2020-11-01 MED ORDER — ONDANSETRON HCL 4 MG/2ML IJ SOLN
INTRAMUSCULAR | Status: DC | PRN
  Administered 2020-11-01: 4 mg via INTRAVENOUS

## 2020-11-01 MED ORDER — FENTANYL CITRATE (PF) 100 MCG/2ML IJ SOLN
INTRAMUSCULAR | Status: DC | PRN
  Administered 2020-11-01: 25 ug via INTRAVENOUS
  Administered 2020-11-01 (×2): 50 ug via INTRAVENOUS

## 2020-11-01 MED ORDER — POVIDONE-IODINE 10 % EX SWAB
2.0000 "application " | Freq: Once | CUTANEOUS | Status: AC
  Administered 2020-11-01: 2 via TOPICAL

## 2020-11-01 MED ORDER — SODIUM CHLORIDE 0.9 % IV SOLN
500.0000 mg | Freq: Three times a day (TID) | INTRAVENOUS | Status: DC
  Administered 2020-11-01 – 2020-11-02 (×3): 500 mg via INTRAVENOUS
  Filled 2020-11-01 (×7): qty 500

## 2020-11-01 MED ORDER — LINEZOLID 600 MG PO TABS
600.0000 mg | ORAL_TABLET | Freq: Two times a day (BID) | ORAL | Status: DC
  Administered 2020-11-01 – 2020-11-02 (×2): 600 mg via ORAL
  Filled 2020-11-01 (×3): qty 1

## 2020-11-01 MED ORDER — PHENYLEPHRINE 40 MCG/ML (10ML) SYRINGE FOR IV PUSH (FOR BLOOD PRESSURE SUPPORT)
PREFILLED_SYRINGE | INTRAVENOUS | Status: AC
  Filled 2020-11-01: qty 10

## 2020-11-01 MED ORDER — DEXAMETHASONE SODIUM PHOSPHATE 4 MG/ML IJ SOLN
INTRAMUSCULAR | Status: DC | PRN
Start: 2020-11-01 — End: 2020-11-01
  Administered 2020-11-01: 4 mg via INTRAVENOUS

## 2020-11-01 MED ORDER — SODIUM CHLORIDE 0.9 % IV SOLN: INTRAVENOUS | Status: DC

## 2020-11-01 MED ORDER — ONDANSETRON HCL 4 MG/2ML IJ SOLN
INTRAMUSCULAR | Status: AC
  Filled 2020-11-01: qty 2

## 2020-11-01 MED ORDER — ACETAMINOPHEN 10 MG/ML IV SOLN: 1000.0000 mg | Freq: Once | INTRAVENOUS | Status: DC | PRN

## 2020-11-01 MED ORDER — FENTANYL CITRATE PF 50 MCG/ML IJ SOSY: 25.0000 ug | PREFILLED_SYRINGE | INTRAMUSCULAR | Status: DC | PRN

## 2020-11-01 MED ORDER — BUPIVACAINE HCL 0.25 % IJ SOLN
INTRAMUSCULAR | Status: DC | PRN
  Administered 2020-11-01: 37 mL

## 2020-11-01 MED ORDER — DEXAMETHASONE SODIUM PHOSPHATE 10 MG/ML IJ SOLN
INTRAMUSCULAR | Status: AC
  Filled 2020-11-01: qty 1

## 2020-11-01 MED ORDER — CEFAZOLIN SODIUM-DEXTROSE 2-4 GM/100ML-% IV SOLN
2.0000 g | INTRAVENOUS | Status: AC
  Administered 2020-11-01: 2 g via INTRAVENOUS
  Filled 2020-11-01: qty 100

## 2020-11-01 SURGICAL SUPPLY — 35 items
BAG COUNTER SPONGE SURGICOUNT (BAG) IMPLANT
BLADE HEX COATED 2.75 (ELECTRODE) ×2 IMPLANT
BNDG ELASTIC 4X5.8 VLCR STR LF (GAUZE/BANDAGES/DRESSINGS) ×2 IMPLANT
BNDG GAUZE ELAST 4 BULKY (GAUZE/BANDAGES/DRESSINGS) ×2 IMPLANT
COVER SURGICAL LIGHT HANDLE (MISCELLANEOUS) ×2 IMPLANT
DECANTER SPIKE VIAL GLASS SM (MISCELLANEOUS) ×2 IMPLANT
DRAPE 3/4 80X56 (DRAPES) ×2 IMPLANT
DRAPE EXTREMITY T 121X128X90 (DISPOSABLE) ×2 IMPLANT
DRAPE LAPAROSCOPIC ABDOMINAL (DRAPES) IMPLANT
DRAPE LAPAROTOMY T 102X78X121 (DRAPES) IMPLANT
DRAPE UTILITY XL STRL (DRAPES) IMPLANT
DRSG EMULSION OIL 3X16 NADH (GAUZE/BANDAGES/DRESSINGS) IMPLANT
DRSG MEPITEL 8X12 (GAUZE/BANDAGES/DRESSINGS) ×2 IMPLANT
DRSG PAD ABDOMINAL 8X10 ST (GAUZE/BANDAGES/DRESSINGS) ×2 IMPLANT
ELECT REM PT RETURN 15FT ADLT (MISCELLANEOUS) ×2 IMPLANT
GAUZE SPONGE 4X4 12PLY STRL (GAUZE/BANDAGES/DRESSINGS) ×2 IMPLANT
GLOVE SURG ENC TEXT LTX SZ7.5 (GLOVE) ×2 IMPLANT
GOWN STRL REUS W/TWL LRG LVL3 (GOWN DISPOSABLE) ×4 IMPLANT
HANDPIECE INTERPULSE COAX TIP (DISPOSABLE) ×2
JET LAVAGE IRRISEPT WOUND (IRRIGATION / IRRIGATOR) ×2
KIT BASIN OR (CUSTOM PROCEDURE TRAY) ×2 IMPLANT
KIT TURNOVER KIT A (KITS) ×2 IMPLANT
LAVAGE JET IRRISEPT WOUND (IRRIGATION / IRRIGATOR) ×1 IMPLANT
NEEDLE HYPO 22GX1.5 SAFETY (NEEDLE) ×2 IMPLANT
NS IRRIG 1000ML POUR BTL (IV SOLUTION) ×2 IMPLANT
PACK GENERAL/GYN (CUSTOM PROCEDURE TRAY) ×2 IMPLANT
SET HNDPC FAN SPRY TIP SCT (DISPOSABLE) ×1 IMPLANT
STAPLER VISISTAT 35W (STAPLE) IMPLANT
SURGILUBE 2OZ TUBE FLIPTOP (MISCELLANEOUS) IMPLANT
SUT MON AB 3-0 SH 27 (SUTURE)
SUT MON AB 3-0 SH27 (SUTURE) IMPLANT
SUT VIC AB 4-0 PS2 27 (SUTURE) IMPLANT
SYR CONTROL 10ML LL (SYRINGE) ×2 IMPLANT
TAPE CLOTH SURG 6X10 WHT LF (GAUZE/BANDAGES/DRESSINGS) IMPLANT
TOWEL OR 17X26 10 PK STRL BLUE (TOWEL DISPOSABLE) ×2 IMPLANT

## 2020-11-01 NOTE — Anesthesia Procedure Notes (Signed)
Procedure Name: LMA Insertion Date/Time: 11/01/2020 1:56 PM Performed by: Lieutenant Diego, CRNA Pre-anesthesia Checklist: Patient identified, Emergency Drugs available, Suction available and Patient being monitored Patient Re-evaluated:Patient Re-evaluated prior to induction Oxygen Delivery Method: Circle system utilized Preoxygenation: Pre-oxygenation with 100% oxygen Induction Type: IV induction Ventilation: Mask ventilation without difficulty LMA: LMA inserted LMA Size: 4.0 Number of attempts: 1 Placement Confirmation: positive ETCO2 and breath sounds checked- equal and bilateral Tube secured with: Tape Dental Injury: Teeth and Oropharynx as per pre-operative assessment

## 2020-11-01 NOTE — Anesthesia Postprocedure Evaluation (Signed)
Anesthesia Post Note  Patient: [name] Price  Procedure(s) Performed: DEBRIDEMENT RIGHT FOOT WOUND (Right: Ankle)     Patient location during evaluation: PACU Anesthesia Type: General Level of consciousness: sedated Pain management: pain level controlled Vital Signs Assessment: post-procedure vital signs reviewed and stable Respiratory status: spontaneous breathing and respiratory function stable Cardiovascular status: stable Postop Assessment: no apparent nausea or vomiting Anesthetic complications: no   No notable events documented.  Last Vitals:  Vitals:   11/01/20 1545 11/01/20 1625  BP: (!) 108/45 (!) 96/48  Pulse: 96 98  Resp: (!) 21 13  Temp: 36.4 C 36.7 C  SpO2: 93% 90%    Last Pain:  Vitals:   11/01/20 1625  TempSrc: Oral  PainSc:                  Christon Gallaway DANIEL

## 2020-11-01 NOTE — Progress Notes (Signed)
Mobility Specialist - Progress Note    11/01/20 1153  Mobility  Activity Ambulated in hall  Level of Assistance Contact guard assist, steadying assist  Assistive Device Four wheel walker  RLE Weight Bearing WBAT  Distance Ambulated (ft) 450 ft  Mobility Ambulated with assistance in hallway  Mobility Response Tolerated well  Mobility performed by Mobility specialist  $Mobility charge 1 Mobility   Pt ambulated 450 ft in hallway using RW. No complaints or reports of pain made during session. Pt returned to bed after session and is awaiting surgery. Left with call bell at side.   [place] Specialist Acute Rehabilitation Services Phone: [phone] 11/01/20, 11:54 AM

## 2020-11-01 NOTE — Transfer of Care (Signed)
Immediate Anesthesia Transfer of Care Note  Patient: [name] Price  Procedure(s) Performed: DEBRIDEMENT RIGHT FOOT WOUND (Right: Ankle)  Patient Location: PACU  Anesthesia Type:General  Level of Consciousness: awake, alert  and oriented  Airway & Oxygen Therapy: Patient Spontanous Breathing and Patient connected to face mask oxygen  Post-op Assessment: Report given to RN, Post -op Vital signs reviewed and stable and Patient moving all extremities X 4  Post vital signs: Reviewed and stable  Last Vitals:  Vitals Value Taken Time  BP 101/69   Temp    Pulse 99 11/01/20 1459  Resp 18 11/01/20 1458  SpO2 99 % 11/01/20 1459  Vitals shown include unvalidated device data.  Last Pain:  Vitals:   11/01/20 1216  TempSrc: Oral  PainSc: 0-No pain         Complications: No notable events documented.

## 2020-11-01 NOTE — Op Note (Signed)
Operative Note   DATE OF OPERATION: 11/01/2020  SURGICAL DEPARTMENT: Plastic Surgery  PREOPERATIVE DIAGNOSES: Right foot infection  POSTOPERATIVE DIAGNOSES:  same  PROCEDURE: Debridement right foot infection totaling 22 x 16 cm including skin and subcutaneous tissue  SURGEON: Talmadge Coventry, MD  ASSISTANT: Verdie Shire, PA The advanced practice practitioner (APP) assisted throughout the case.  The APP was essential in retraction and counter traction when needed to make the case progress smoothly.  This retraction and assistance made it possible to see the tissue plans for the procedure.  The assistance was needed for blood control, tissue re-approximation and assisted with closure of the incision site.  ANESTHESIA:  General.   COMPLICATIONS: None.   INDICATIONS FOR PROCEDURE:  The patient, [name] Price is a 80 y.o. female born on [birth year], is here for treatment of right foot skin necrosis secondary to suspected infection. MRN: [id number]  CONSENT:  Informed consent was obtained directly from the patient. Risks, benefits and alternatives were fully discussed. Specific risks including but not limited to bleeding, infection, hematoma, seroma, scarring, pain, contracture, asymmetry, wound healing problems, and need for further surgery were all discussed. The patient did have an ample opportunity to have questions answered to satisfaction.   DESCRIPTION OF PROCEDURE:  The patient was taken to the operating room. SCDs were placed and antibiotics were given.  General anesthesia was administered.  The patient's operative site was prepped and draped in a sterile fashion. A time out was performed and all information was confirmed to be correct.  I started by evaluating the wound.  This was a large wound on the dorsal aspect of the foot with a full-thickness eschar.  There was surrounding cellulitic changes extending up about halfway up the lower extremity.  There was some serous clear  fluid that was able to be expressed from the superior aspect of the wound.  The eschar which at this point had become soft was able to be removed easily with pickups and scissors.  There was quite a bit of subcutaneous rind and devitalized tissue which I was able to debride with a curette.  I did appreciate some undermining both inferiorly and superiorly underneath the subcutaneous tissues and so I did debride additional skin in these areas with cautery to ensure that there was no closed off undermining.  I sent the eschar and some surrounding skin for both culture and pathology.  At this point the majority the devitalized tissue appeared to be removed.  There was no exposed bone or tendon out right.  I then irrigated with Irrisept solution.  This was followed by about 2 L of pulse lavage saline irrigation.  Given the contamination and infection I elected not to proceed with Integra or wound VAC placement.  Mepitel and wet-to-dry dressings were applied after meticulous hemostasis.  The patient tolerated the procedure well.  There were no complications. The patient was allowed to wake from anesthesia, extubated and taken to the recovery room in satisfactory condition.

## 2020-11-01 NOTE — Progress Notes (Signed)
Pharmacy Antibiotic Note  FRANCESKA STRAHM is a 80 y.o. female admitted on 10/29/2020 with right ankle wound. ID concerned for nocardia or NTM consulted pharmacy to dose imipenem.  Plan: Imipenem 500mg  IV q8h Follow renal function and clinical course  Height: 5\' 3"  (160 cm) Weight: 64.9 kg (143 lb 1.3 oz) IBW/kg (Calculated) : 52.4  Temp (24hrs), Avg:98.1 F (36.7 C), Min:97.6 F (36.4 C), Max:98.7 F (37.1 C)  Recent Labs  Lab 10/29/20 1441 10/29/20 1637 10/29/20 2149 10/30/20 0627 10/31/20 0445  WBC 7.3  --  6.7 4.8 4.6  CREATININE 0.56  --   --  0.50 0.52  LATICACIDVEN 1.6 0.9  --   --   --     Estimated Creatinine Clearance: 53.4 mL/min (by C-G formula based on SCr of 0.52 mg/dL).    Allergies  Allergen Reactions   Codeine Nausea And Vomiting     Thank you for allowing pharmacy to be a part of this patient's care.  Dolly Rias RPh 11/01/2020, 10:00 PM

## 2020-11-01 NOTE — Anesthesia Preprocedure Evaluation (Addendum)
Anesthesia Evaluation  Patient identified by MRN, date of birth, ID band Patient awake    Reviewed: Allergy & Precautions, NPO status , Patient's Chart, lab work & pertinent test results  Airway Mallampati: II  TM Distance: >3 FB Neck ROM: Full    Dental  (+) Upper Dentures, Partial Lower   Pulmonary neg pulmonary ROS,    Pulmonary exam normal        Cardiovascular hypertension, Pt. on medications and Pt. on home beta blockers  Rhythm:Regular Rate:Normal     Neuro/Psych Anxiety Depression negative neurological ROS  negative psych ROS   GI/Hepatic Neg liver ROS, GERD  Controlled,  Endo/Other    Renal/GU negative Renal ROS  negative genitourinary   Musculoskeletal  (+) Arthritis , Osteoarthritis,  Right ankle cellulitis    Abdominal (+)  Abdomen: soft. Bowel sounds: normal.  Peds  Hematology  (+) anemia ,   Anesthesia Other Findings   Reproductive/Obstetrics                            Anesthesia Physical Anesthesia Plan  ASA: 2  Anesthesia Plan: General   Post-op Pain Management:    Induction: Intravenous  PONV Risk Score and Plan: Ondansetron and Dexamethasone  Airway Management Planned: Mask and LMA  Additional Equipment: None  Intra-op Plan:   Post-operative Plan: Extubation in OR  Informed Consent: I have reviewed the patients History and Physical, chart, labs and discussed the procedure including the risks, benefits and alternatives for the proposed anesthesia with the patient or authorized representative who has indicated his/her understanding and acceptance.     Dental advisory given  Plan Discussed with: CRNA  Anesthesia Plan Comments: (Lab Results      Component                Value               Date                      WBC                      4.6                 10/31/2020                HGB                      7.8 (L)             10/31/2020                 HCT                      25.2 (L)            10/31/2020                MCV                      96.6                10/31/2020                PLT                      344                   10/31/2020           Lab Results      Component                Value               Date                      NA                       138                 10/31/2020                K                        3.7                 10/31/2020                CO2                      26                  10/31/2020                GLUCOSE                  105 (H)             10/31/2020                BUN                      14                  10/31/2020                CREATININE               0.52                10/31/2020                CALCIUM                  7.9 (L)             10/31/2020                GFRNONAA                 >60                 10/31/2020                GFRAA                                        11/01/2006            >60        The eGFR has been calculated using the MDRD equation. This calculation has not been validated in all clinical)        Anesthesia Quick Evaluation

## 2020-11-01 NOTE — Care Management Important Message (Signed)
Important Message  Patient Details IM Letter given to the Patient. Name: [name] Price MRN: [id number] Date of Birth: Oct 08, 1940   Medicare Important Message Given:  Yes     Kerin Salen 11/01/2020, 10:04 AM

## 2020-11-01 NOTE — Progress Notes (Signed)
PROGRESS NOTE  [name] Price ZYS:[id number] DOB: 07-25-1940 DOA: 10/29/2020 PCP: Tonia Ghent, MD   LOS: 3 days   Brief Narrative / Interim history: 80 year old female with history of depression, hypertension, but otherwise fairly healthy who comes into the hospital with persistent right ankle cellulitis.  She was recently hospitalized 8/27-9/7 for right ankle cellulitis.  She came in with a lesion in the end of August that appeared suddenly, patient does not recall any injury/bites to that ankle.  During the hospital stay there was concern for poison ivy due to a blister and was briefly on steroids as well along with intravenous antibiotics.  Orthopedic surgery was consulted and the blister was lanced with what appeared to be 60 to 80 cc of clear, noninfectious, nonhemorrhagic fluid drained.  She was transitioned to oral antibiotics and eventually discharged home on 9/7.  She has been on antibiotics since but continued to have purulent discharge as well as redness, and increasing her ulceration.  She came back to the hospital and was admitted again 9/17  Subjective / 24h Interval events: Overall feeling improved, has some dull pain to the right ankle but much better.  No fever, no chills  Assessment & Plan: Principal Problem Right foot wound, right foot cellulitis, sepsis ruled out-orthopedic surgery consulted, appreciate input.  She underwent an MRI of the ankle which was negative for deep processes -ID consulted, discussed with Dr. Drucilla Schmidt, this is less likely a simple bacterial infection as she did not respond to antibiotics.  Differential is a nontuberculous Mycobacteria versus autoimmune phenomenon such as pyoderma gangrenosum.  ANCA profile was sent and pending.  Currently off antibiotics since 9/19 -Orthopedics, Dr. Doran Durand and Dr. Claudia Desanctis with plastic surgery evaluated patient and looks like she will be taken to the OR today to try debridement with Integra and wound VAC.  Discussions  were initially for BKA but patient did not want to have an amputation  Active Problems Iron deficiency anemia-start iron supplementations.  Hemoglobin worse likely in the setting of worsening wound/chronic inflammation  PSVT-during her prior hospital stay occasional episodes of SVT were noted in telemetry, asymptomatic, started on beta-blockers.  Continue.  2D echo at that time was unremarkable.  GERD-continue PPI  Scheduled Meds:  vitamin C  250 mg Oral BID   chlorhexidine  60 mL Topical Once   chlorhexidine  15 mL Mouth/Throat Once   feeding supplement  237 mL Oral BID BM   ferrous gluconate  324 mg Oral BID WC   metoprolol tartrate  12.5 mg Oral BID   multivitamin with minerals  1 tablet Oral Daily   pantoprazole (PROTONIX) IV  40 mg Intravenous Q12H   PARoxetine  25 mg Oral q morning   povidone-iodine  2 application Topical Once   Continuous Infusions:  sodium chloride     [START ON 11/02/2020]  ceFAZolin (ANCEF) IV     lactated ringers     PRN Meds:.acetaminophen **OR** acetaminophen, fentaNYL (SUBLIMAZE) injection  Diet Orders (From admission, onward)     Start     Ordered   11/01/20 0001  Diet NPO time specified  Diet effective midnight        10/31/20 1847            DVT prophylaxis: SCDs Start: 10/29/20 2124     Code Status: Full Code  Family Communication: No family at bedside  Status is: Inpatient  Remains inpatient appropriate because:Inpatient level of care appropriate due to severity of illness  Dispo: The  patient is from: Home              Anticipated d/c is to: Home              Patient currently is not medically stable to d/c.   Difficult to place patient No   Level of care: Telemetry  Consultants:  ID Orthopedic surgery   Procedures:  none  Microbiology  Blood cultures 9/17-no growth to date  Antimicrobials: Vancomycin 9/17 >> 9/19 Cefepime 9/17 >> 9/19   Objective: Vitals:   10/31/20 0643 10/31/20 1315 10/31/20 2046  11/01/20 0503  BP: (!) 131/51 128/71 121/60 137/68  Pulse: 77 78 91 79  Resp: 17 18 16 16   Temp: 98.2 F (36.8 C) 99.1 F (37.3 C) 99.6 F (37.6 C) 98.4 F (36.9 C)  TempSrc: Oral Oral Oral Oral  SpO2: 99% 98% 97% 98%  Weight:      Height:        Intake/Output Summary (Last 24 hours) at 11/01/2020 1212 Last data filed at 11/01/2020 2500 Gross per 24 hour  Intake 150 ml  Output --  Net 150 ml    Filed Weights   10/29/20 1318  Weight: 64.9 kg    Examination:  Constitutional: NAD, in bed Eyes: Anicteric ENMT: mmm Neck: normal, supple Respiratory: Clear to auscultation bilaterally with no wheezing or crackles, normal respiratory effort Cardiovascular: Regular rate and rhythm, no murmurs, no edema Abdomen: Soft, NT, ND, bowel sounds positive Musculoskeletal: no clubbing / cyanosis.  Skin: Large necrotic area right lower extremity, unchanged Neurologic: No focal deficits   Data Reviewed: I have independently reviewed following labs and imaging studies   CBC: Recent Labs  Lab 10/29/20 1441 10/29/20 2149 10/30/20 0627 10/31/20 0445  WBC 7.3 6.7 4.8 4.6  NEUTROABS 5.8  --  2.9  --   HGB 8.5* 8.4* 7.6* 7.8*  HCT 26.5* 25.8* 23.4* 25.2*  MCV 95.7 96.3 96.7 96.6  PLT 330 325 306 370    Basic Metabolic Panel: Recent Labs  Lab 10/29/20 1441 10/29/20 2149 10/30/20 0627 10/31/20 0445  NA 137  --  139 138  K 4.0  --  4.5 3.7  CL 102  --  106 106  CO2 26  --  27 26  GLUCOSE 108*  --  106* 105*  BUN 17  --  13 14  CREATININE 0.56  --  0.50 0.52  CALCIUM 8.7*  --  8.0* 7.9*  MG  --  1.9 1.8  --   PHOS  --  3.3 3.2  --     Liver Function Tests: Recent Labs  Lab 10/29/20 1441 10/30/20 0627  AST 12* 12*  ALT 7 9  ALKPHOS 53 45  BILITOT 0.5 0.6  PROT 5.9* 5.2*  ALBUMIN 3.0* 2.2*    Coagulation Profile: No results for input(s): INR, PROTIME in the last 168 hours. HbA1C: Recent Labs    10/29/20 2149  HGBA1C 4.6*    CBG: No results for input(s):  GLUCAP in the last 168 hours.  Recent Results (from the past 240 hour(s))  Blood culture (routine x 2)     Status: None (Preliminary result)   Collection Time: 10/29/20  2:30 PM   Specimen: BLOOD  Result Value Ref Range Status   Specimen Description   Final    BLOOD RIGHT ANTECUBITAL Performed at [facility], [address] [place], [place] [address]    Special Requests   Final    BOTTLES DRAWN AEROBIC AND  ANAEROBIC Blood Culture results may not be optimal due to an inadequate volume of blood received in culture bottles Performed at [facility], [address], [place], [place] [address]    Culture   Final    NO GROWTH 3 DAYS Performed at [facility], [place] [address] [place], [place] [address]    Report Status PENDING  Incomplete  Resp Panel by RT-PCR (Flu A&B, Covid) Nasopharyngeal Swab     Status: None   Collection Time: 10/29/20  2:32 PM   Specimen: Nasopharyngeal Swab; Nasopharyngeal(NP) swabs in vial transport medium  Result Value Ref Range Status   SARS Coronavirus 2 by RT PCR NEGATIVE NEGATIVE Final    Comment: (NOTE) SARS-CoV-2 target nucleic acids are NOT DETECTED.  The SARS-CoV-2 RNA is generally detectable in upper respiratory specimens during the acute phase of infection. The lowest concentration of SARS-CoV-2 viral copies this assay can detect is 138 copies/mL. A negative result does not preclude SARS-Cov-2 infection and should not be used as the sole basis for treatment or other patient management decisions. A negative result may occur with  improper specimen collection/handling, submission of specimen other than nasopharyngeal swab, presence of viral mutation(s) within the areas targeted by this assay, and inadequate number of viral copies(<138 copies/mL). A negative result must be combined with clinical observations, patient history, and epidemiological information. The expected result is Negative.  Fact Sheet  for Patients:  EntrepreneurPulse.com.au  Fact Sheet for Healthcare Providers:  IncredibleEmployment.be  This test is no t yet approved or cleared by the [place] FDA and  has been authorized for detection and/or diagnosis of SARS-CoV-2 by FDA under an Emergency Use Authorization (EUA). This EUA will remain  in effect (meaning this test can be used) for the duration of the COVID-19 declaration under Section 564(b)(1) of the Act, 21 U.S.C.section 360bbb-3(b)(1), unless the authorization is terminated  or revoked sooner.       Influenza A by PCR NEGATIVE NEGATIVE Final   Influenza B by PCR NEGATIVE NEGATIVE Final    Comment: (NOTE) The Xpert Xpress SARS-CoV-2/FLU/RSV plus assay is intended as an aid in the diagnosis of influenza from Nasopharyngeal swab specimens and should not be used as a sole basis for treatment. Nasal washings and aspirates are unacceptable for Xpert Xpress SARS-CoV-2/FLU/RSV testing.  Fact Sheet for Patients: EntrepreneurPulse.com.au  Fact Sheet for Healthcare Providers: IncredibleEmployment.be  This test is not yet approved or cleared by the [place] FDA and has been authorized for detection and/or diagnosis of SARS-CoV-2 by FDA under an Emergency Use Authorization (EUA). This EUA will remain in effect (meaning this test can be used) for the duration of the COVID-19 declaration under Section 564(b)(1) of the Act, 21 U.S.C. section 360bbb-3(b)(1), unless the authorization is terminated or revoked.  Performed at [facility], [address], [place], [place] [address]   Blood culture (routine x 2)     Status: None (Preliminary result)   Collection Time: 10/29/20  2:35 PM   Specimen: BLOOD  Result Value Ref Range Status   Specimen Description   Final    BLOOD LEFT ANTECUBITAL Performed at [facility], [address],  [place], [place] [address]    Special Requests   Final    BOTTLES DRAWN AEROBIC AND ANAEROBIC Blood Culture results may not be optimal due to an inadequate volume of blood received in culture bottles Performed at [facility], [address] [place], [place] [address]    Culture  Final    NO GROWTH 3 DAYS Performed at [facility], [place] [address], [place], [place] [address]    Report Status PENDING  Incomplete      Radiology Studies: No results found.   Marzetta Board, MD, PhD [facility]  Between 7 am - 7 pm I am available, please contact me via Amion (for emergencies) or Securechat (non urgent messages)  Between 7 pm - 7 am I am not available, please contact night coverage MD/APP via Amion

## 2020-11-01 NOTE — Progress Notes (Signed)
80 year old female status post debridement of right foot wound with Dr. Claudia Desanctis today.  Recommend wet-to-dry dressing changes daily to right foot.  Recommend applying Adaptic to wound bed followed by wet-to-dry dressing, cover with ABD pad, Kerlix, Ace wrap.  We will consult case management for assistance with setting up home health services if patient is discharged home.  We will continue to follow, anticipate possibly returning to OR next week depending on patient's progress for application of wound matrix.

## 2020-11-01 NOTE — Brief Op Note (Signed)
11/01/2020  2:58 PM  PATIENT:  [name] Price  80 y.o. female  PRE-OPERATIVE DIAGNOSIS:  Right ankle wound  POST-OPERATIVE DIAGNOSIS:  Right ankle wound  PROCEDURE:  Procedure(s) with comments: DEBRIDEMENT RIGHT FOOT WOUND (Right) - 60 minutes  SURGEON:  Surgeon(s) and Role:    * Aleiya Rye, Steffanie Dunn, MD - Primary  PHYSICIAN ASSISTANT: Software engineer, PA   ASSISTANTS: none   ANESTHESIA:   general  EBL:  50   BLOOD ADMINISTERED:none  DRAINS: none   LOCAL MEDICATIONS USED:  MARCAINE     SPECIMEN:  Source of Specimen:  right foot wound  DISPOSITION OF SPECIMEN:  PATHOLOGY  COUNTS:  YES  TOURNIQUET:  * No tourniquets in log *  DICTATION: .Dragon Dictation  PLAN OF CARE: Admit   PATIENT DISPOSITION:  PACU - hemodynamically stable.   Delay start of Pharmacological VTE agent (>24hrs) due to surgical blood loss or risk of bleeding: not applicable

## 2020-11-01 NOTE — Interval H&P Note (Signed)
History and Physical Interval Note:  11/01/2020 1:03 PM  [name] Price  has presented today for surgery, with the diagnosis of Right ankle wound.  The various methods of treatment have been discussed with the patient and family. After consideration of risks, benefits and other options for treatment, the patient has consented to  Procedure(s) with comments: DEBRIDEMENT AND CLOSURE WOUND (Right) - 60 minutes APPLICATION OF WOUND VAC (Right) APPLICATION OF SKIN SUBSTITUTE (Right) as a surgical intervention.  The patient's history has been reviewed, patient examined, no change in status, stable for surgery.  I have reviewed the patient's chart and labs.  Questions were answered to the patient's satisfaction.     [name]

## 2020-11-02 ENCOUNTER — Encounter (HOSPITAL_COMMUNITY): Payer: Self-pay | Admitting: Plastic Surgery

## 2020-11-02 DIAGNOSIS — L97512 Non-pressure chronic ulcer of other part of right foot with fat layer exposed: Secondary | ICD-10-CM

## 2020-11-02 DIAGNOSIS — M7989 Other specified soft tissue disorders: Secondary | ICD-10-CM

## 2020-11-02 DIAGNOSIS — I10 Essential (primary) hypertension: Secondary | ICD-10-CM | POA: Diagnosis not present

## 2020-11-02 DIAGNOSIS — L089 Local infection of the skin and subcutaneous tissue, unspecified: Secondary | ICD-10-CM | POA: Diagnosis not present

## 2020-11-02 LAB — CBC
HCT: 21.7 % — ABNORMAL LOW (ref 36.0–46.0)
Hemoglobin: 6.8 g/dL — CL (ref 12.0–15.0)
MCH: 30.4 pg (ref 26.0–34.0)
MCHC: 31.3 g/dL (ref 30.0–36.0)
MCV: 96.9 fL (ref 80.0–100.0)
Platelets: 363 10*3/uL (ref 150–400)
RBC: 2.24 MIL/uL — ABNORMAL LOW (ref 3.87–5.11)
RDW: 14.3 % (ref 11.5–15.5)
WBC: 6.8 10*3/uL (ref 4.0–10.5)
nRBC: 0 % (ref 0.0–0.2)

## 2020-11-02 LAB — BASIC METABOLIC PANEL
Anion gap: 5 (ref 5–15)
BUN: 17 mg/dL (ref 8–23)
CO2: 26 mmol/L (ref 22–32)
Calcium: 8.1 mg/dL — ABNORMAL LOW (ref 8.9–10.3)
Chloride: 104 mmol/L (ref 98–111)
Creatinine, Ser: 0.55 mg/dL (ref 0.44–1.00)
GFR, Estimated: >60 mL/min (ref >60)
Glucose, Bld: 134 mg/dL — ABNORMAL HIGH (ref 70–99)
Potassium: 4.8 mmol/L (ref 3.5–5.1)
Sodium: 135 mmol/L (ref 135–145)

## 2020-11-02 LAB — ANCA PROFILE
Anti-MPO Antibodies: <0.2 units (ref 0.0–0.9)
Anti-PR3 Antibodies: <0.2 units (ref 0.0–0.9)
Atypical P-ANCA titer: <1:20 {titer}
C-ANCA: <1:20 {titer}
P-ANCA: <1:20 {titer}

## 2020-11-02 LAB — PREPARE RBC (CROSSMATCH)

## 2020-11-02 MED ORDER — SULFAMETHOXAZOLE-TRIMETHOPRIM 800-160 MG PO TABS
2.0000 | ORAL_TABLET | Freq: Two times a day (BID) | ORAL | Status: DC
  Administered 2020-11-02 – 2020-11-04 (×4): 2 via ORAL
  Filled 2020-11-02 (×4): qty 2

## 2020-11-02 MED ORDER — SODIUM CHLORIDE 0.9% IV SOLUTION: Freq: Once | INTRAVENOUS | Status: AC

## 2020-11-02 MED ORDER — PANTOPRAZOLE SODIUM 40 MG PO TBEC
40.0000 mg | DELAYED_RELEASE_TABLET | Freq: Two times a day (BID) | ORAL | Status: DC
  Administered 2020-11-02 – 2020-11-10 (×17): 40 mg via ORAL
  Filled 2020-11-02 (×17): qty 1

## 2020-11-02 NOTE — Progress Notes (Signed)
PROGRESS NOTE    [name] Price  BTD:[id number] DOB: 1940/03/23 DOA: 10/29/2020 PCP: Tonia Ghent, MD    No chief complaint on file.   Brief Narrative:   80 year old lady with prior history of hypertension, depression presents with right ankle cellulitis. She was recently hospitalized from 8/27 till 9/7 for right ankle cellulitis, patient received IV antibiotics.  Orthopedic surgery was consulted and she underwent blister aspiration and eventually discharged home on 10/19/20.  Despite being on antibiotics patient continues to have right foot/ankle redness worsening for purulent discharge and ulceration.  She was admitted on 10/29/2020 and underwent an MRI which was negative for any deep abscesses.  Assessment & Plan:   Active Problems:   Sepsis ([place])   Right foot infection   Anemia   HTN (hypertension)   Right foot cellulitis/wound Sepsis ruled out MRI of the right ankle shows Very large necrotic ulceration involving the lower calf anterolaterally, the lateral ankle, and extending into the lateral dorsal forefoot at least as far as the MTP joints. No osteomyelitis or deep abscesses found. Small effusion of the posterior subtalar joint, without marrow edema along the bony surfaces to further indicate septic arthritis ID consulted, suggested differential could be an autoimmune phenomenon such as pyoderma gangrenosum versus Non- tuberculosis mycobacterial infection.  Auto immune work up sent by ID. CK wnl,  CRP is 4.2. sed rate is 48.  TSH wnl.  HIV screen is negative.  ABI reviewed.  Plastic surgery consulted and she underwent debridement with Integra on 11/01/2020. She was started on Primaxin on 11/02/2008 and Zyvox.   Essential hypertension Blood pressure parameters are optimal Continue with metoprolol   History of depression Patient on Paxil continue the same    Acute anemia of blood loss superimposed on anemia of chronic disease/iron deficiency anemia Iron  supplementation on board and will need a PRBC transfusion ordered. Recheck hemoglobin in the morning.  DVT prophylaxis: scd's Code Status: Full code  family Communication: None at bedside Disposition:   Status is: Inpatient  Remains inpatient appropriate because:Ongoing diagnostic testing needed not appropriate for outpatient work up, Unsafe d/c plan, and IV treatments appropriate due to intensity of illness or inability to take PO  Dispo: The patient is from: Home              Anticipated d/c is to:  Pending              Patient currently is not medically stable to d/c.   Difficult to place patient No       Consultants:  Plastic surgery Dr. Claudia Desanctis Orthopedics Dr. Hiram Comber Infectious disease Dr. Drucilla Schmidt  Procedures: Debridement by plastic surgery on 11/02/2019  Antimicrobials:  Antibiotics Given (last 72 hours)     Date/Time Action Medication Dose Rate   10/30/20 1634 New Bag/Given   ceFEPIme (MAXIPIME) 2 g in sodium chloride 0.9 % 100 mL IVPB 2 g 200 mL/hr   10/31/20 0424 New Bag/Given   ceFEPIme (MAXIPIME) 2 g in sodium chloride 0.9 % 100 mL IVPB 2 g 200 mL/hr   10/31/20 0523 New Bag/Given   vancomycin (VANCOCIN) IVPB 1000 mg/200 mL premix 1,000 mg 200 mL/hr   11/01/20 1400 Given   ceFAZolin (ANCEF) IVPB 2g/100 mL premix 2 g    11/01/20 2320 Given   linezolid (ZYVOX) tablet 600 mg 600 mg    11/01/20 2323 New Bag/Given   imipenem-cilastatin (PRIMAXIN) 500 mg in sodium chloride 0.9 % 100 mL IVPB 500 mg 200 mL/hr  11/02/20 0626 New Bag/Given   imipenem-cilastatin (PRIMAXIN) 500 mg in sodium chloride 0.9 % 100 mL IVPB 500 mg 200 mL/hr   11/02/20 6948 Given   linezolid (ZYVOX) tablet 600 mg 600 mg          Subjective: No chest pain or shortness of breath, pain in the foot is better controlled  Objective: Vitals:   11/02/20 0449 11/02/20 0740 11/02/20 1129 11/02/20 1157  BP: (!) 98/38 (!) 100/51 (!) 97/49 (!) 101/58  Pulse: 78 78 89 87  Resp: 14 20 19 18   Temp:  98 F (36.7 C) 97.8 F (36.6 C) 98.2 F (36.8 C) 98.6 F (37 C)  TempSrc: Oral Oral Oral   SpO2: 93% 95% 93%   Weight:      Height:        Intake/Output Summary (Last 24 hours) at 11/02/2020 1238 Last data filed at 11/02/2020 1142 Gross per 24 hour  Intake 1625 ml  Output 10 ml  Net 1615 ml   Filed Weights   10/29/20 1318 11/01/20 1216  Weight: 64.9 kg 64.9 kg    Examination:  General exam: Appears calm and comfortable  Respiratory system: Clear to auscultation. Respiratory effort normal. Cardiovascular system: S1 & S2 heard, RRR. No JVD,  No pedal edema. Gastrointestinal system: Abdomen is nondistended, soft and nontender. Normal bowel sounds heard. Central nervous system: Alert and oriented. No focal neurological deficits. Extremities: Right foot bandaged Skin: No rashes, lesions or ulcers Psychiatry: Mood & affect appropriate.     Data Reviewed: I have personally reviewed following labs and imaging studies  CBC: Recent Labs  Lab 10/29/20 1441 10/29/20 2149 10/30/20 0627 10/31/20 0445 11/02/20 0501  WBC 7.3 6.7 4.8 4.6 6.8  NEUTROABS 5.8  --  2.9  --   --   HGB 8.5* 8.4* 7.6* 7.8* 6.8*  HCT 26.5* 25.8* 23.4* 25.2* 21.7*  MCV 95.7 96.3 96.7 96.6 96.9  PLT 330 325 306 344 546    Basic Metabolic Panel: Recent Labs  Lab 10/29/20 1441 10/29/20 2149 10/30/20 0627 10/31/20 0445 11/02/20 0501  NA 137  --  139 138 135  K 4.0  --  4.5 3.7 4.8  CL 102  --  106 106 104  CO2 26  --  27 26 26   GLUCOSE 108*  --  106* 105* 134*  BUN 17  --  13 14 17   CREATININE 0.56  --  0.50 0.52 0.55  CALCIUM 8.7*  --  8.0* 7.9* 8.1*  MG  --  1.9 1.8  --   --   PHOS  --  3.3 3.2  --   --     GFR: Estimated Creatinine Clearance: 53.4 mL/min (by C-G formula based on SCr of 0.55 mg/dL).  Liver Function Tests: Recent Labs  Lab 10/29/20 1441 10/30/20 0627  AST 12* 12*  ALT 7 9  ALKPHOS 53 45  BILITOT 0.5 0.6  PROT 5.9* 5.2*  ALBUMIN 3.0* 2.2*    CBG: No results  for input(s): GLUCAP in the last 168 hours.   Recent Results (from the past 240 hour(s))  Blood culture (routine x 2)     Status: None (Preliminary result)   Collection Time: 10/29/20  2:30 PM   Specimen: BLOOD  Result Value Ref Range Status   Specimen Description   Final    BLOOD RIGHT ANTECUBITAL Performed at [facility], [address], [place], [place] [address]    Special Requests   Final  BOTTLES DRAWN AEROBIC AND ANAEROBIC Blood Culture results may not be optimal due to an inadequate volume of blood received in culture bottles Performed at [facility], [address], [place], [place] [address]    Culture   Final    NO GROWTH 4 DAYS Performed at [facility], [place] [address], [place], [place] [address]    Report Status PENDING  Incomplete  Resp Panel by RT-PCR (Flu A&B, Covid) Nasopharyngeal Swab     Status: None   Collection Time: 10/29/20  2:32 PM   Specimen: Nasopharyngeal Swab; Nasopharyngeal(NP) swabs in vial transport medium  Result Value Ref Range Status   SARS Coronavirus 2 by RT PCR NEGATIVE NEGATIVE Final    Comment: (NOTE) SARS-CoV-2 target nucleic acids are NOT DETECTED.  The SARS-CoV-2 RNA is generally detectable in upper respiratory specimens during the acute phase of infection. The lowest concentration of SARS-CoV-2 viral copies this assay can detect is 138 copies/mL. A negative result does not preclude SARS-Cov-2 infection and should not be used as the sole basis for treatment or other patient management decisions. A negative result may occur with  improper specimen collection/handling, submission of specimen other than nasopharyngeal swab, presence of viral mutation(s) within the areas targeted by this assay, and inadequate number of viral copies(<138 copies/mL). A negative result must be combined with clinical observations, patient history, and epidemiological information. The expected result is  Negative.  Fact Sheet for Patients:  EntrepreneurPulse.com.au  Fact Sheet for Healthcare Providers:  IncredibleEmployment.be  This test is no t yet approved or cleared by the [place] FDA and  has been authorized for detection and/or diagnosis of SARS-CoV-2 by FDA under an Emergency Use Authorization (EUA). This EUA will remain  in effect (meaning this test can be used) for the duration of the COVID-19 declaration under Section 564(b)(1) of the Act, 21 U.S.C.section 360bbb-3(b)(1), unless the authorization is terminated  or revoked sooner.       Influenza A by PCR NEGATIVE NEGATIVE Final   Influenza B by PCR NEGATIVE NEGATIVE Final    Comment: (NOTE) The Xpert Xpress SARS-CoV-2/FLU/RSV plus assay is intended as an aid in the diagnosis of influenza from Nasopharyngeal swab specimens and should not be used as a sole basis for treatment. Nasal washings and aspirates are unacceptable for Xpert Xpress SARS-CoV-2/FLU/RSV testing.  Fact Sheet for Patients: EntrepreneurPulse.com.au  Fact Sheet for Healthcare Providers: IncredibleEmployment.be  This test is not yet approved or cleared by the [place] FDA and has been authorized for detection and/or diagnosis of SARS-CoV-2 by FDA under an Emergency Use Authorization (EUA). This EUA will remain in effect (meaning this test can be used) for the duration of the COVID-19 declaration under Section 564(b)(1) of the Act, 21 U.S.C. section 360bbb-3(b)(1), unless the authorization is terminated or revoked.  Performed at [facility], [address] [place], [place] [address]   Blood culture (routine x 2)     Status: None (Preliminary result)   Collection Time: 10/29/20  2:35 PM   Specimen: BLOOD  Result Value Ref Range Status   Specimen Description   Final    BLOOD LEFT ANTECUBITAL Performed at [facility], [address], [place],  [address]    Special Requests   Final    BOTTLES DRAWN AEROBIC AND ANAEROBIC Blood Culture results may not be optimal due to an inadequate volume of blood received in culture bottles Performed at [facility], [address] [place],  [address]  Culture   Final    NO GROWTH 4 DAYS Performed at [facility], [place] [address], [place], [place] [address]    Report Status PENDING  Incomplete  Aerobic/Anaerobic Culture w Gram Stain (surgical/deep wound)     Status: None (Preliminary result)   Collection Time: 11/01/20  2:14 PM   Specimen: PATH Soft tissue  Result Value Ref Range Status   Specimen Description   Final    TISSUE RIGHT FOOT Performed at [facility] [address], [place], [place] [address]    Special Requests   Final    NONE Performed at [facility], [place] [address] [place], [place] [address]    Gram Stain   Final    ABUNDANT WBC PRESENT, PREDOMINANTLY PMN RARE GRAM POSITIVE COCCI IN PAIRS FEW GRAM NEGATIVE RODS Performed at [facility], [place] [address] [place],  [address]    Culture RARE GRAM NEGATIVE RODS  Final   Report Status PENDING  Incomplete         Radiology Studies: No results found.      Scheduled Meds:  vitamin C  250 mg Oral BID   feeding supplement  237 mL Oral BID BM   ferrous gluconate  324 mg Oral BID WC   linezolid  600 mg Oral Q12H   metoprolol tartrate  12.5 mg Oral BID   multivitamin with minerals  1 tablet Oral Daily   pantoprazole  40 mg Oral BID   PARoxetine  25 mg Oral q morning   Continuous Infusions:  imipenem-cilastatin 500 mg (11/02/20 0626)     LOS: 4 days    Time spent: 38 minutes.     Hosie Poisson, MD [facility]   To contact the attending provider between 7A-7P or the covering provider during after hours 7P-7A, please log into the web site [email or web address] and access using universal  [place] password for that web site. If you do not have the password, please call the hospital operator.  11/02/2020, 12:38 PM

## 2020-11-02 NOTE — Progress Notes (Signed)
1 Day Post-Op  Subjective: 80 year old female status postdebridement of right foot wound with Dr. Claudia Desanctis yesterday, she reports minimal pain today.  She reports Tylenol was used for pain control and this worked well.  She is not having any infectious symptoms.  She has no complaints at this time.  Objective: Vital signs in last 24 hours: Temp:  [97.8 F (36.6 C)-98.8 F (37.1 C)] 98.8 F (37.1 C) (09/21 1414) Pulse Rate:  [78-98] 87 (09/21 1414) Resp:  [14-20] 16 (09/21 1414) BP: (92-110)/(38-60) 110/60 (09/21 1414) SpO2:  [92 %-96 %] 93 % (09/21 1414) Last BM Date: 11/01/20  Intake/Output from previous day: 09/20 0701 - 09/21 0700 In: 1100 [P.O.:150; I.V.:500; IV Piggyback:450] Out: 10 [Blood:10] Intake/Output this shift: Total I/O In: 1099 [P.O.:480; Blood:619] Out: -   General appearance: alert, cooperative, no distress, and family at bedside Extremities: Left lower extremity normal, right lower extremity with dressing in place, all 5 toes with good range of motion, distal extremities warm to touch, no erythema noted of exposed skin.  Dressing in place, is not soiled. Pulses: 2+ and symmetric, did not evaluate right lower extremity pulse.  Lab Results:  CBC Latest Ref Rng & Units 11/02/2020 10/31/2020 10/30/2020  WBC 4.0 - 10.5 K/uL 6.8 4.6 4.8  Hemoglobin 12.0 - 15.0 g/dL 6.8(LL) 7.8(L) 7.6(L)  Hematocrit 36.0 - 46.0 % 21.7(L) 25.2(L) 23.4(L)  Platelets 150 - 400 K/uL 363 344 306    BMET Recent Labs    10/31/20 0445 11/02/20 0501  NA 138 135  K 3.7 4.8  CL 106 104  CO2 26 26  GLUCOSE 105* 134*  BUN 14 17  CREATININE 0.52 0.55  CALCIUM 7.9* 8.1*   PT/INR No results for input(s): LABPROT, INR in the last 72 hours. ABG No results for input(s): PHART, HCO3 in the last 72 hours.  Invalid input(s): PCO2, PO2  Studies/Results: No results found.  Anti-infectives: Anti-infectives (From admission, onward)    Start     Dose/Rate Route Frequency Ordered Stop    11/02/20 0600  ceFAZolin (ANCEF) IVPB 2g/100 mL premix        2 g 200 mL/hr over 30 Minutes Intravenous On call to O.R. 11/01/20 1205 11/01/20 1410   11/01/20 2245  imipenem-cilastatin (PRIMAXIN) 500 mg in sodium chloride 0.9 % 100 mL IVPB        500 mg 200 mL/hr over 30 Minutes Intravenous Every 8 hours 11/01/20 2156     11/01/20 2230  linezolid (ZYVOX) tablet 600 mg        600 mg Oral Every 12 hours 11/01/20 2143     10/31/20 0500  vancomycin (VANCOCIN) IVPB 1000 mg/200 mL premix  Status:  Discontinued        1,000 mg 200 mL/hr over 60 Minutes Intravenous Every 24 hours 10/30/20 1057 10/31/20 1108   10/30/20 0500  vancomycin (VANCOREADY) IVPB 1250 mg/250 mL  Status:  Discontinued        1,250 mg 166.7 mL/hr over 90 Minutes Intravenous Every 24 hours 10/29/20 2344 10/30/20 1057   10/30/20 0400  ceFEPIme (MAXIPIME) 2 g in sodium chloride 0.9 % 100 mL IVPB  Status:  Discontinued        2 g 200 mL/hr over 30 Minutes Intravenous Every 12 hours 10/29/20 2344 10/31/20 1108   10/29/20 1630  vancomycin (VANCOCIN) IVPB 1000 mg/200 mL premix        1,000 mg 200 mL/hr over 60 Minutes Intravenous  Once 10/29/20 1614 10/29/20 1911   10/29/20  1615  ceFEPIme (MAXIPIME) 2 g in sodium chloride 0.9 % 100 mL IVPB        2 g 200 mL/hr over 30 Minutes Intravenous  Once 10/29/20 1614 10/29/20 1911       Assessment/Plan: s/p Procedure(s): DEBRIDEMENT RIGHT FOOT WOUND   Await cultures and Pathology.  Plan for additional debridement, application of wound matrix and application of wound VAC on 11/08/2020 with Dr. Claudia Desanctis.  All the patient's questions were answered to her content.  All of the patient's family's questions were answered to her content.  Recommend continuing with wet-to-dry dressing changes daily to right lower extremity wound.  Recommend removing Mepitel at next dressing change which would be tomorrow.  Replace this with Adaptic.  Patient is receiving protein shakes for increased protein  intake.  Case management has been consulted for assistance with setting up home health upon discharge, patient will need home wound VAC.   LOS: 4 days    Charlies Constable, PA-C 11/02/2020

## 2020-11-02 NOTE — Progress Notes (Signed)

## 2020-11-02 NOTE — Progress Notes (Signed)
Subjective: No new complaints   Antibiotics:  Anti-infectives (From admission, onward)    Start     Dose/Rate Route Frequency Ordered Stop   11/02/20 0600  ceFAZolin (ANCEF) IVPB 2g/100 mL premix        2 g 200 mL/hr over 30 Minutes Intravenous On call to O.R. 11/01/20 1205 11/01/20 1410   11/01/20 2245  imipenem-cilastatin (PRIMAXIN) 500 mg in sodium chloride 0.9 % 100 mL IVPB        500 mg 200 mL/hr over 30 Minutes Intravenous Every 8 hours 11/01/20 2156     11/01/20 2230  linezolid (ZYVOX) tablet 600 mg        600 mg Oral Every 12 hours 11/01/20 2143     10/31/20 0500  vancomycin (VANCOCIN) IVPB 1000 mg/200 mL premix  Status:  Discontinued        1,000 mg 200 mL/hr over 60 Minutes Intravenous Every 24 hours 10/30/20 1057 10/31/20 1108   10/30/20 0500  vancomycin (VANCOREADY) IVPB 1250 mg/250 mL  Status:  Discontinued        1,250 mg 166.7 mL/hr over 90 Minutes Intravenous Every 24 hours 10/29/20 2344 10/30/20 1057   10/30/20 0400  ceFEPIme (MAXIPIME) 2 g in sodium chloride 0.9 % 100 mL IVPB  Status:  Discontinued        2 g 200 mL/hr over 30 Minutes Intravenous Every 12 hours 10/29/20 2344 10/31/20 1108   10/29/20 1630  vancomycin (VANCOCIN) IVPB 1000 mg/200 mL premix        1,000 mg 200 mL/hr over 60 Minutes Intravenous  Once 10/29/20 1614 10/29/20 1911   10/29/20 1615  ceFEPIme (MAXIPIME) 2 g in sodium chloride 0.9 % 100 mL IVPB        2 g 200 mL/hr over 30 Minutes Intravenous  Once 10/29/20 1614 10/29/20 1911       Medications: Scheduled Meds:  vitamin C  250 mg Oral BID   feeding supplement  237 mL Oral BID BM   ferrous gluconate  324 mg Oral BID WC   linezolid  600 mg Oral Q12H   metoprolol tartrate  12.5 mg Oral BID   multivitamin with minerals  1 tablet Oral Daily   pantoprazole  40 mg Oral BID   PARoxetine  25 mg Oral q morning   Continuous Infusions:  imipenem-cilastatin 500 mg (11/02/20 0626)   PRN Meds:.acetaminophen **OR** acetaminophen,  fentaNYL (SUBLIMAZE) injection    Objective: Weight change:   Intake/Output Summary (Last 24 hours) at 11/02/2020 1247 Last data filed at 11/02/2020 1142 Gross per 24 hour  Intake 1625 ml  Output 10 ml  Net 1615 ml    Blood pressure (!) 101/58, pulse 87, temperature 98.6 F (37 C), resp. rate 18, height 5\' 3"  (1.6 m), weight 64.9 kg, SpO2 93 %. Temp:  [97.6 F (36.4 C)-98.6 F (37 C)] 98.6 F (37 C) (09/21 1157) Pulse Rate:  [78-119] 87 (09/21 1157) Resp:  [13-45] 18 (09/21 1157) BP: (92-132)/(38-119) 101/58 (09/21 1157) SpO2:  [90 %-100 %] 93 % (09/21 1129)  Physical Exam: Physical Exam Constitutional:      General: She is not in acute distress.    Appearance: She is well-developed. She is not diaphoretic.  HENT:     Head: Normocephalic and atraumatic.     Right Ear: External ear normal.     Left Ear: External ear normal.     Mouth/Throat:     Pharynx: No oropharyngeal exudate.  Eyes:     General: No scleral icterus.    Conjunctiva/sclera: Conjunctivae normal.     Pupils: Pupils are equal, round, and reactive to light.  Cardiovascular:     Rate and Rhythm: Normal rate and regular rhythm.  Pulmonary:     Effort: Pulmonary effort is normal. No respiratory distress.     Breath sounds: Normal breath sounds. No wheezing.  Abdominal:     General: There is no distension.     Palpations: Abdomen is soft.     Tenderness: There is no abdominal tenderness.  Musculoskeletal:        General: No tenderness. Normal range of motion.  Lymphadenopathy:     Cervical: No cervical adenopathy.  Skin:    General: Skin is warm.     Coloration: Skin is not pale.     Findings: Erythema and rash present.  Neurological:     General: No focal deficit present.     Mental Status: She is alert and oriented to person, place, and time.     Motor: No abnormal muscle tone.     Coordination: Coordination normal.  Psychiatric:        Mood and Affect: Mood normal.        Behavior: Behavior  normal.        Thought Content: Thought content normal.        Judgment: Judgment normal.     Picture of her leg that she took after her hospital discharge:       Picture of leg  10/31/2020       CBC:    BMET Recent Labs    10/31/20 0445 11/02/20 0501  NA 138 135  K 3.7 4.8  CL 106 104  CO2 26 26  GLUCOSE 105* 134*  BUN 14 17  CREATININE 0.52 0.55  CALCIUM 7.9* 8.1*      Liver Panel  No results for input(s): PROT, ALBUMIN, AST, ALT, ALKPHOS, BILITOT, BILIDIR, IBILI in the last 72 hours.      Sedimentation Rate Recent Labs    10/31/20 1102  ESRSEDRATE 48*    C-Reactive Protein Recent Labs    10/31/20 1102  CRP 4.2*     Micro Results: Recent Results (from the past 720 hour(s))  Resp Panel by RT-PCR (Flu A&B, Covid) Nasopharyngeal Swab     Status: None   Collection Time: 10/08/20  1:57 PM   Specimen: Nasopharyngeal Swab; Nasopharyngeal(NP) swabs in vial transport medium  Result Value Ref Range Status   SARS Coronavirus 2 by RT PCR NEGATIVE NEGATIVE Final    Comment: (NOTE) SARS-CoV-2 target nucleic acids are NOT DETECTED.  The SARS-CoV-2 RNA is generally detectable in upper respiratory specimens during the acute phase of infection. The lowest concentration of SARS-CoV-2 viral copies this assay can detect is 138 copies/mL. A negative result does not preclude SARS-Cov-2 infection and should not be used as the sole basis for treatment or other patient management decisions. A negative result may occur with  improper specimen collection/handling, submission of specimen other than nasopharyngeal swab, presence of viral mutation(s) within the areas targeted by this assay, and inadequate number of viral copies(<138 copies/mL). A negative result must be combined with clinical observations, patient history, and epidemiological information. The expected result is Negative.  Fact Sheet for Patients:   EntrepreneurPulse.com.au  Fact Sheet for Healthcare Providers:  IncredibleEmployment.be  This test is no t yet approved or cleared by the [place] FDA and  has been authorized for detection and/or diagnosis  of SARS-CoV-2 by FDA under an Emergency Use Authorization (EUA). This EUA will remain  in effect (meaning this test can be used) for the duration of the COVID-19 declaration under Section 564(b)(1) of the Act, 21 U.S.C.section 360bbb-3(b)(1), unless the authorization is terminated  or revoked sooner.       Influenza A by PCR NEGATIVE NEGATIVE Final   Influenza B by PCR NEGATIVE NEGATIVE Final    Comment: (NOTE) The Xpert Xpress SARS-CoV-2/FLU/RSV plus assay is intended as an aid in the diagnosis of influenza from Nasopharyngeal swab specimens and should not be used as a sole basis for treatment. Nasal washings and aspirates are unacceptable for Xpert Xpress SARS-CoV-2/FLU/RSV testing.  Fact Sheet for Patients: EntrepreneurPulse.com.au  Fact Sheet for Healthcare Providers: IncredibleEmployment.be  This test is not yet approved or cleared by the [place] FDA and has been authorized for detection and/or diagnosis of SARS-CoV-2 by FDA under an Emergency Use Authorization (EUA). This EUA will remain in effect (meaning this test can be used) for the duration of the COVID-19 declaration under Section 564(b)(1) of the Act, 21 U.S.C. section 360bbb-3(b)(1), unless the authorization is terminated or revoked.  Performed at [facility], [place] [address] [place], [place] [address]   Blood Culture (routine x 2)     Status: None   Collection Time: 10/08/20  2:05 PM   Specimen: BLOOD  Result Value Ref Range Status   Specimen Description BLOOD LEFT ANTECUBITAL  Final   Special Requests   Final    BOTTLES DRAWN AEROBIC AND ANAEROBIC Blood Culture adequate volume   Culture   Final    NO GROWTH 5  DAYS Performed at [facility], [place] [address], [place], [place] [address]    Report Status 10/13/2020 FINAL  Final  Blood Culture (routine x 2)     Status: None   Collection Time: 10/08/20  2:14 PM   Specimen: BLOOD  Result Value Ref Range Status   Specimen Description BLOOD SITE NOT SPECIFIED  Final   Special Requests   Final    BOTTLES DRAWN AEROBIC AND ANAEROBIC Blood Culture results may not be optimal due to an inadequate volume of blood received in culture bottles   Culture   Final    NO GROWTH 5 DAYS Performed at [facility], [place] [address] [place], [place] [address]    Report Status 10/13/2020 FINAL  Final  C Difficile Quick Screen w PCR reflex     Status: None   Collection Time: 10/08/20  4:24 PM   Specimen: STOOL  Result Value Ref Range Status   C Diff antigen NEGATIVE NEGATIVE Final   C Diff toxin NEGATIVE NEGATIVE Final   C Diff interpretation No C. difficile detected.  Final    Comment: Performed at [facility], [place] [address] [place], [place] [address]  Blood culture (routine x 2)     Status: None (Preliminary result)   Collection Time: 10/29/20  2:30 PM   Specimen: BLOOD  Result Value Ref Range Status   Specimen Description   Final    BLOOD RIGHT ANTECUBITAL Performed at [facility], [address] [place], [place] [address]    Special Requests   Final    BOTTLES DRAWN AEROBIC AND ANAEROBIC Blood Culture results may not be optimal due to an inadequate volume of blood received in culture bottles Performed at [facility], [address] [place], [place] [address]    Culture  Final    NO GROWTH 4 DAYS Performed at [facility], [place] [address] [place], [place] [address]    Report Status PENDING  Incomplete  Resp Panel by RT-PCR (Flu A&B, Covid) Nasopharyngeal Swab     Status: None   Collection Time: 10/29/20  2:32 PM   Specimen: Nasopharyngeal Swab; Nasopharyngeal(NP) swabs in vial  transport medium  Result Value Ref Range Status   SARS Coronavirus 2 by RT PCR NEGATIVE NEGATIVE Final    Comment: (NOTE) SARS-CoV-2 target nucleic acids are NOT DETECTED.  The SARS-CoV-2 RNA is generally detectable in upper respiratory specimens during the acute phase of infection. The lowest concentration of SARS-CoV-2 viral copies this assay can detect is 138 copies/mL. A negative result does not preclude SARS-Cov-2 infection and should not be used as the sole basis for treatment or other patient management decisions. A negative result may occur with  improper specimen collection/handling, submission of specimen other than nasopharyngeal swab, presence of viral mutation(s) within the areas targeted by this assay, and inadequate number of viral copies(<138 copies/mL). A negative result must be combined with clinical observations, patient history, and epidemiological information. The expected result is Negative.  Fact Sheet for Patients:  EntrepreneurPulse.com.au  Fact Sheet for Healthcare Providers:  IncredibleEmployment.be  This test is no t yet approved or cleared by the [place] FDA and  has been authorized for detection and/or diagnosis of SARS-CoV-2 by FDA under an Emergency Use Authorization (EUA). This EUA will remain  in effect (meaning this test can be used) for the duration of the COVID-19 declaration under Section 564(b)(1) of the Act, 21 U.S.C.section 360bbb-3(b)(1), unless the authorization is terminated  or revoked sooner.       Influenza A by PCR NEGATIVE NEGATIVE Final   Influenza B by PCR NEGATIVE NEGATIVE Final    Comment: (NOTE) The Xpert Xpress SARS-CoV-2/FLU/RSV plus assay is intended as an aid in the diagnosis of influenza from Nasopharyngeal swab specimens and should not be used as a sole basis for treatment. Nasal washings and aspirates are unacceptable for Xpert Xpress SARS-CoV-2/FLU/RSV testing.  Fact  Sheet for Patients: EntrepreneurPulse.com.au  Fact Sheet for Healthcare Providers: IncredibleEmployment.be  This test is not yet approved or cleared by the [place] FDA and has been authorized for detection and/or diagnosis of SARS-CoV-2 by FDA under an Emergency Use Authorization (EUA). This EUA will remain in effect (meaning this test can be used) for the duration of the COVID-19 declaration under Section 564(b)(1) of the Act, 21 U.S.C. section 360bbb-3(b)(1), unless the authorization is terminated or revoked.  Performed at [facility], [address] [place], [place] [address]   Blood culture (routine x 2)     Status: None (Preliminary result)   Collection Time: 10/29/20  2:35 PM   Specimen: BLOOD  Result Value Ref Range Status   Specimen Description   Final    BLOOD LEFT ANTECUBITAL Performed at [facility], [address] [place], [place] [address]    Special Requests   Final    BOTTLES DRAWN AEROBIC AND ANAEROBIC Blood Culture results may not be optimal due to an inadequate volume of blood received in culture bottles Performed at [facility], [address] [place], [place] [address]    Culture   Final    NO GROWTH 4 DAYS Performed at [facility], [place] [address], [place], [place] [address]    Report Status PENDING  Incomplete  Aerobic/Anaerobic Culture w Gram Stain (surgical/deep  wound)     Status: None (Preliminary result)   Collection Time: 11/01/20  2:14 PM   Specimen: PATH Soft tissue  Result Value Ref Range Status   Specimen Description   Final    TISSUE RIGHT FOOT Performed at [facility] [address], [place], [place] [address]    Special Requests   Final    NONE Performed at [facility], [place] [address], [place], [place] [address]    Gram Stain   Final    ABUNDANT WBC PRESENT, PREDOMINANTLY PMN RARE  GRAM POSITIVE COCCI IN PAIRS FEW GRAM NEGATIVE RODS Performed at [facility], [place] [address], [place], [place] [address]    Culture RARE GRAM NEGATIVE RODS  Final   Report Status PENDING  Incomplete    Studies/Results: No results found.    Assessment/Plan:  INTERVAL HISTORY:   She underwent debridement of her right sided foot ulcer with 22 x 16 cm debrided including skin and subcutaneous tissue which has been sent for pathology and culture   Active Problems:   Sepsis ([place])   Right foot infection   Anemia   HTN (hypertension)    [name] Price is a 81 y.o. female with  subacute onset of right foot ulcer that followed initial problems with allergies, scratching, then pedicure and a week later with pain, swelling and erythema. She was seen at [facility] and prescribed Keflex, but had worsening of necrotic like appearance to the wound and came back, treat again with cefazolin and had unroofing of her blister with bloody material that came out, briefly treated with steroids then with vancomycin and cefepime through number the 6, then discharged on Augmentin and doxycycline but apparent worsening of her wound.  Is again on broad-spectrum antibiotics in the form of vancomycin and cefepime.  And talk to the patient she does not have pain in her ankle and she never had fever or other systemic symptoms to suggest severe infection.   I do not think this is a pyogenic bacterial infection.  It could certainly be a manifestation of a nontuberculous mycobacteria given her recent pedicure, or another subacute organism such as nocardia as she did do gardening with her feet exposed in the time.  Prior to onset of illness.  This could also be an autoimmune phenomenon such as pyoderma gangrenosum  He has now undergone debridement by plastic surgery with cultures sent and also pathology sent.  I started her on Zyvox and imipenem in case we were dealing with the nocardia and to  give her some coverage for typical bacteria though again I am highly skeptical that this is due to a typical bacterial infection.  We will follow-up her path report as well as her AFB stains and cultures and fungal stains and cultures.  I spent more than 37 minutes with the patient including  face to face counseling of the patient guarding her ulcerative wound differential diagnosis for this personally reviewing her CBC BMP her updated cultures her [facility] medical records in preparation for the visit and during the visit and in coordination of her care.       LOS: 4 days   Alcide Evener 11/02/2020, 12:47 PM

## 2020-11-03 DIAGNOSIS — I10 Essential (primary) hypertension: Secondary | ICD-10-CM | POA: Diagnosis not present

## 2020-11-03 DIAGNOSIS — L089 Local infection of the skin and subcutaneous tissue, unspecified: Secondary | ICD-10-CM | POA: Diagnosis not present

## 2020-11-03 LAB — BPAM RBC
Blood Product Expiration Date: 202209292359
ISSUE DATE / TIME: 202209211138
Unit Type and Rh: 600

## 2020-11-03 LAB — CBC WITH DIFFERENTIAL/PLATELET
Abs Immature Granulocytes: 0.04 10*3/uL (ref 0.00–0.07)
Basophils Absolute: 0.1 10*3/uL (ref 0.0–0.1)
Basophils Relative: 1 %
Eosinophils Absolute: 0.2 10*3/uL (ref 0.0–0.5)
Eosinophils Relative: 3 %
HCT: 25.9 % — ABNORMAL LOW (ref 36.0–46.0)
Hemoglobin: 8.2 g/dL — ABNORMAL LOW (ref 12.0–15.0)
Immature Granulocytes: 1 %
Lymphocytes Relative: 19 %
Lymphs Abs: 1.4 10*3/uL (ref 0.7–4.0)
MCH: 30.3 pg (ref 26.0–34.0)
MCHC: 31.7 g/dL (ref 30.0–36.0)
MCV: 95.6 fL (ref 80.0–100.0)
Monocytes Absolute: 0.8 10*3/uL (ref 0.1–1.0)
Monocytes Relative: 11 %
Neutro Abs: 4.9 10*3/uL (ref 1.7–7.7)
Neutrophils Relative %: 65 %
Platelets: 397 10*3/uL (ref 150–400)
RBC: 2.71 MIL/uL — ABNORMAL LOW (ref 3.87–5.11)
RDW: 16.5 % — ABNORMAL HIGH (ref 11.5–15.5)
WBC: 7.5 10*3/uL (ref 4.0–10.5)
nRBC: 0 % (ref 0.0–0.2)

## 2020-11-03 LAB — BASIC METABOLIC PANEL
Anion gap: 9 (ref 5–15)
BUN: 20 mg/dL (ref 8–23)
CO2: 26 mmol/L (ref 22–32)
Calcium: 8.6 mg/dL — ABNORMAL LOW (ref 8.9–10.3)
Chloride: 107 mmol/L (ref 98–111)
Creatinine, Ser: 0.64 mg/dL (ref 0.44–1.00)
GFR, Estimated: >60 mL/min (ref >60)
Glucose, Bld: 92 mg/dL (ref 70–99)
Potassium: 4.3 mmol/L (ref 3.5–5.1)
Sodium: 142 mmol/L (ref 135–145)

## 2020-11-03 LAB — TYPE AND SCREEN
ABO/RH(D): A NEG
Antibody Screen: NEGATIVE
Unit division: 0

## 2020-11-03 LAB — CULTURE, BLOOD (ROUTINE X 2)
Culture: NO GROWTH
Culture: NO GROWTH

## 2020-11-03 NOTE — Evaluation (Signed)
Occupational Therapy Evaluation Patient Details Name: [name] Price MRN: [id number] DOB: [birth year] Today's Date: 11/03/2020   History of Present Illness Patient is a 80 year old female admitted for treatment of right foot skin necrosis secondary to suspected infection. S/p debridement 9/20 with likely additional debridement and placement of wound vac on 9/27. PMH includes breast CA and bilateral THA   Clinical Impression   Patient reporting mild pain in R foot "I'm glad I can feel some pain, they talked I may end up with nerve damage." Patient very pleasant, cooperative and demonstrates independence with functional transfers and ambulation up full length of hallway without AD. Only needed assist donning R sock due to bulk of dressing/ace wrap otherwise functionally able to complete all ADL tasks. No acute OT needs, will sign off.       Recommendations for follow up therapy are one component of a multi-disciplinary discharge planning process, led by the attending physician.  Recommendations may be updated based on patient status, additional functional criteria and insurance authorization.   Follow Up Recommendations  No OT follow up    Equipment Recommendations  None recommended by OT       Precautions / Restrictions Restrictions Weight Bearing Restrictions: No RLE Weight Bearing: Weight bearing as tolerated      Mobility Bed Mobility Overal bed mobility: Modified Independent                  Transfers Overall transfer level: Independent                    Balance Overall balance assessment: Independent                                         ADL either performed or assessed with clinical judgement   ADL Overall ADL's : Independent                                       General ADL Comments: patient is able to perform functional ambulation and transfers without AD use, has been up to bathroom with nursing and reports no  difficulties. Is able to pull up L sock, does need assist donning R sock due to significant bulk of ace wrap/bandaging otherwise would be fully able      Pertinent Vitals/Pain Pain Assessment: Faces Faces Pain Scale: Hurts a little bit Pain Location: R foot Pain Descriptors / Indicators: Discomfort Pain Intervention(s): Monitored during session     Hand Dominance Right   Extremity/Trunk Assessment Upper Extremity Assessment Upper Extremity Assessment: Overall WFL for tasks assessed   Lower Extremity Assessment Lower Extremity Assessment: Defer to PT evaluation   Cervical / Trunk Assessment Cervical / Trunk Assessment: Normal   Communication Communication Communication: No difficulties                 Home Living Family/patient expects to be discharged to:: Private residence Living Arrangements: Spouse/significant other Available Help at Discharge: Family Type of Home: House Home Access: Ramped entrance   Entrance Stairs-Rails: None Home Layout: One level     Bathroom Shower/Tub: Occupational psychologist: Handicapped height     Home Equipment: Grab bars - tub/shower;Shower seat;Walker - 2 wheels          Prior Functioning/Environment Level of Independence: Independent  with assistive device(s)        Comments: Had been using RW secondary to pain; drives; I with ADLs/IADLs        OT Problem List: Pain         OT Goals(Current goals can be found in the care plan section) Acute Rehab OT Goals Patient Stated Goal: "get my foot better" OT Goal Formulation: All assessment and education complete, DC therapy   AM-PAC OT "6 Clicks" Daily Activity     Outcome Measure Help from another person eating meals?: None Help from another person taking care of personal grooming?: None Help from another person toileting, which includes using toliet, bedpan, or urinal?: None Help from another person bathing (including washing, rinsing, drying)?: None Help  from another person to put on and taking off regular upper body clothing?: None Help from another person to put on and taking off regular lower body clothing?: None 6 Click Score: 24   End of Session Nurse Communication: Mobility status  Activity Tolerance: Patient tolerated treatment well Patient left: in bed;with call bell/phone within reach  OT Visit Diagnosis: Pain Pain - Right/Left: Right Pain - part of body: Ankle and joints of foot                Time: 2694-8546 OT Time Calculation (min): 17 min Charges:  OT General Charges $OT Visit: 1 Visit OT Evaluation $OT Eval Low Complexity: [place] OT OT pager: [place] 11/03/2020, 11:34 AM

## 2020-11-03 NOTE — Progress Notes (Signed)
PT Cancellation Note  Patient Details Name: [name] Price MRN: [id number] DOB: [birth year]   Cancelled Treatment:    Reason Eval/Treat Not Completed: PT screened, no needs identified, will sign off (Per OT pt is ambulating in hallway with no device and supervision level. No acute PT needs. Please reconsult if there is a change/decline in functional status.)  Verner Mould, DPT Acute Rehabilitation Services Office [phone] Pager [phone]

## 2020-11-03 NOTE — Progress Notes (Signed)
Subjective: No new complaints   Antibiotics:  Anti-infectives (From admission, onward)    Start     Dose/Rate Route Frequency Ordered Stop   11/02/20 2200  sulfamethoxazole-trimethoprim (BACTRIM DS) 800-160 MG per tablet 2 tablet        2 tablet Oral Every 12 hours 11/02/20 2010     11/02/20 0600  ceFAZolin (ANCEF) IVPB 2g/100 mL premix        2 g 200 mL/hr over 30 Minutes Intravenous On call to O.R. 11/01/20 1205 11/01/20 1410   11/01/20 2245  imipenem-cilastatin (PRIMAXIN) 500 mg in sodium chloride 0.9 % 100 mL IVPB  Status:  Discontinued        500 mg 200 mL/hr over 30 Minutes Intravenous Every 8 hours 11/01/20 2156 11/02/20 2010   11/01/20 2230  linezolid (ZYVOX) tablet 600 mg  Status:  Discontinued        600 mg Oral Every 12 hours 11/01/20 2143 11/02/20 2010   10/31/20 0500  vancomycin (VANCOCIN) IVPB 1000 mg/200 mL premix  Status:  Discontinued        1,000 mg 200 mL/hr over 60 Minutes Intravenous Every 24 hours 10/30/20 1057 10/31/20 1108   10/30/20 0500  vancomycin (VANCOREADY) IVPB 1250 mg/250 mL  Status:  Discontinued        1,250 mg 166.7 mL/hr over 90 Minutes Intravenous Every 24 hours 10/29/20 2344 10/30/20 1057   10/30/20 0400  ceFEPIme (MAXIPIME) 2 g in sodium chloride 0.9 % 100 mL IVPB  Status:  Discontinued        2 g 200 mL/hr over 30 Minutes Intravenous Every 12 hours 10/29/20 2344 10/31/20 1108   10/29/20 1630  vancomycin (VANCOCIN) IVPB 1000 mg/200 mL premix        1,000 mg 200 mL/hr over 60 Minutes Intravenous  Once 10/29/20 1614 10/29/20 1911   10/29/20 1615  ceFEPIme (MAXIPIME) 2 g in sodium chloride 0.9 % 100 mL IVPB        2 g 200 mL/hr over 30 Minutes Intravenous  Once 10/29/20 1614 10/29/20 1911       Medications: Scheduled Meds:  vitamin C  250 mg Oral BID   feeding supplement  237 mL Oral BID BM   ferrous gluconate  324 mg Oral BID WC   metoprolol tartrate  12.5 mg Oral BID   multivitamin with minerals  1 tablet Oral Daily    pantoprazole  40 mg Oral BID   PARoxetine  25 mg Oral q morning   sulfamethoxazole-trimethoprim  2 tablet Oral Q12H   Continuous Infusions:   PRN Meds:.acetaminophen **OR** acetaminophen, fentaNYL (SUBLIMAZE) injection    Objective: Weight change:   Intake/Output Summary (Last 24 hours) at 11/03/2020 1535 Last data filed at 11/03/2020 1223 Gross per 24 hour  Intake 920 ml  Output --  Net 920 ml    Blood pressure 112/65, pulse 87, temperature 99.2 F (37.3 C), temperature source Oral, resp. rate 18, height 5\' 3"  (1.6 m), weight 64.9 kg, SpO2 98 %. Temp:  [98.5 F (36.9 C)-99.2 F (37.3 C)] 99.2 F (37.3 C) (09/22 1350) Pulse Rate:  [80-104] 87 (09/22 1350) Resp:  [14-18] 18 (09/22 1350) BP: (112-127)/(52-85) 112/65 (09/22 1350) SpO2:  [92 %-98 %] 98 % (09/22 1350)  Physical Exam: Physical Exam Constitutional:      General: She is not in acute distress.    Appearance: She is well-developed. She is not diaphoretic.  HENT:     Head: Normocephalic  and atraumatic.     Right Ear: External ear normal.     Left Ear: External ear normal.     Mouth/Throat:     Pharynx: No oropharyngeal exudate.  Eyes:     General: No scleral icterus.    Conjunctiva/sclera: Conjunctivae normal.     Pupils: Pupils are equal, round, and reactive to light.  Cardiovascular:     Rate and Rhythm: Normal rate and regular rhythm.  Pulmonary:     Effort: Pulmonary effort is normal. No respiratory distress.     Breath sounds: No wheezing.  Abdominal:     General: There is no distension.     Palpations: Abdomen is soft.     Tenderness: There is abdominal tenderness. There is rebound.  Musculoskeletal:        General: No tenderness. Normal range of motion.  Lymphadenopathy:     Cervical: No cervical adenopathy.  Skin:    General: Skin is warm and dry.     Coloration: Skin is not pale.     Findings: No erythema or rash.  Neurological:     General: No focal deficit present.     Mental Status:  She is alert and oriented to person, place, and time.     Motor: No abnormal muscle tone.     Coordination: Coordination normal.  Psychiatric:        Mood and Affect: Mood normal.        Behavior: Behavior normal.        Thought Content: Thought content normal.        Judgment: Judgment normal.     Picture of her leg that she took after her hospital discharge:       Picture of leg  10/31/2020      Not examined today  CBC:    BMET Recent Labs    11/02/20 0501 11/03/20 0942  NA 135 142  K 4.8 4.3  CL 104 107  CO2 26 26  GLUCOSE 134* 92  BUN 17 20  CREATININE 0.55 0.64  CALCIUM 8.1* 8.6*      Liver Panel  No results for input(s): PROT, ALBUMIN, AST, ALT, ALKPHOS, BILITOT, BILIDIR, IBILI in the last 72 hours.      Sedimentation Rate No results for input(s): ESRSEDRATE in the last 72 hours.  C-Reactive Protein No results for input(s): CRP in the last 72 hours.   Micro Results: Recent Results (from the past 720 hour(s))  Resp Panel by RT-PCR (Flu A&B, Covid) Nasopharyngeal Swab     Status: None   Collection Time: 10/08/20  1:57 PM   Specimen: Nasopharyngeal Swab; Nasopharyngeal(NP) swabs in vial transport medium  Result Value Ref Range Status   SARS Coronavirus 2 by RT PCR NEGATIVE NEGATIVE Final    Comment: (NOTE) SARS-CoV-2 target nucleic acids are NOT DETECTED.  The SARS-CoV-2 RNA is generally detectable in upper respiratory specimens during the acute phase of infection. The lowest concentration of SARS-CoV-2 viral copies this assay can detect is 138 copies/mL. A negative result does not preclude SARS-Cov-2 infection and should not be used as the sole basis for treatment or other patient management decisions. A negative result may occur with  improper specimen collection/handling, submission of specimen other than nasopharyngeal swab, presence of viral mutation(s) within the areas targeted by this assay, and inadequate number of  viral copies(<138 copies/mL). A negative result must be combined with clinical observations, patient history, and epidemiological information. The expected result is Negative.  Fact Sheet for Patients:  EntrepreneurPulse.com.au  Fact Sheet for Healthcare Providers:  IncredibleEmployment.be  This test is no t yet approved or cleared by the [place] FDA and  has been authorized for detection and/or diagnosis of SARS-CoV-2 by FDA under an Emergency Use Authorization (EUA). This EUA will remain  in effect (meaning this test can be used) for the duration of the COVID-19 declaration under Section 564(b)(1) of the Act, 21 U.S.C.section 360bbb-3(b)(1), unless the authorization is terminated  or revoked sooner.       Influenza A by PCR NEGATIVE NEGATIVE Final   Influenza B by PCR NEGATIVE NEGATIVE Final    Comment: (NOTE) The Xpert Xpress SARS-CoV-2/FLU/RSV plus assay is intended as an aid in the diagnosis of influenza from Nasopharyngeal swab specimens and should not be used as a sole basis for treatment. Nasal washings and aspirates are unacceptable for Xpert Xpress SARS-CoV-2/FLU/RSV testing.  Fact Sheet for Patients: EntrepreneurPulse.com.au  Fact Sheet for Healthcare Providers: IncredibleEmployment.be  This test is not yet approved or cleared by the [place] FDA and has been authorized for detection and/or diagnosis of SARS-CoV-2 by FDA under an Emergency Use Authorization (EUA). This EUA will remain in effect (meaning this test can be used) for the duration of the COVID-19 declaration under Section 564(b)(1) of the Act, 21 U.S.C. section 360bbb-3(b)(1), unless the authorization is terminated or revoked.  Performed at [facility], [place] [address], [place], [place] [address]   Blood Culture (routine x 2)     Status: None   Collection Time: 10/08/20  2:05 PM   Specimen: BLOOD  Result  Value Ref Range Status   Specimen Description BLOOD LEFT ANTECUBITAL  Final   Special Requests   Final    BOTTLES DRAWN AEROBIC AND ANAEROBIC Blood Culture adequate volume   Culture   Final    NO GROWTH 5 DAYS Performed at [facility], [place] [address] [place], [place] [address]    Report Status 10/13/2020 FINAL  Final  Blood Culture (routine x 2)     Status: None   Collection Time: 10/08/20  2:14 PM   Specimen: BLOOD  Result Value Ref Range Status   Specimen Description BLOOD SITE NOT SPECIFIED  Final   Special Requests   Final    BOTTLES DRAWN AEROBIC AND ANAEROBIC Blood Culture results may not be optimal due to an inadequate volume of blood received in culture bottles   Culture   Final    NO GROWTH 5 DAYS Performed at [facility], [place] [address] [place], [place] [address]    Report Status 10/13/2020 FINAL  Final  C Difficile Quick Screen w PCR reflex     Status: None   Collection Time: 10/08/20  4:24 PM   Specimen: STOOL  Result Value Ref Range Status   C Diff antigen NEGATIVE NEGATIVE Final   C Diff toxin NEGATIVE NEGATIVE Final   C Diff interpretation No C. difficile detected.  Final    Comment: Performed at [facility], [place] [address], [place], [place] [address]  Blood culture (routine x 2)     Status: None   Collection Time: 10/29/20  2:30 PM   Specimen: BLOOD  Result Value Ref Range Status   Specimen Description   Final    BLOOD RIGHT ANTECUBITAL Performed at [facility], [address], [place],  [address]    Special Requests   Final    BOTTLES DRAWN AEROBIC AND ANAEROBIC Blood Culture results may not  be optimal due to an inadequate volume of blood received in culture bottles Performed at [facility], [address], [place], [place] [address]    Culture   Final    NO GROWTH 5 DAYS Performed at [facility], [place] [address] [place], [place] [address]    Report Status 11/03/2020 FINAL   Final  Resp Panel by RT-PCR (Flu A&B, Covid) Nasopharyngeal Swab     Status: None   Collection Time: 10/29/20  2:32 PM   Specimen: Nasopharyngeal Swab; Nasopharyngeal(NP) swabs in vial transport medium  Result Value Ref Range Status   SARS Coronavirus 2 by RT PCR NEGATIVE NEGATIVE Final    Comment: (NOTE) SARS-CoV-2 target nucleic acids are NOT DETECTED.  The SARS-CoV-2 RNA is generally detectable in upper respiratory specimens during the acute phase of infection. The lowest concentration of SARS-CoV-2 viral copies this assay can detect is 138 copies/mL. A negative result does not preclude SARS-Cov-2 infection and should not be used as the sole basis for treatment or other patient management decisions. A negative result may occur with  improper specimen collection/handling, submission of specimen other than nasopharyngeal swab, presence of viral mutation(s) within the areas targeted by this assay, and inadequate number of viral copies(<138 copies/mL). A negative result must be combined with clinical observations, patient history, and epidemiological information. The expected result is Negative.  Fact Sheet for Patients:  EntrepreneurPulse.com.au  Fact Sheet for Healthcare Providers:  IncredibleEmployment.be  This test is no t yet approved or cleared by the [place] FDA and  has been authorized for detection and/or diagnosis of SARS-CoV-2 by FDA under an Emergency Use Authorization (EUA). This EUA will remain  in effect (meaning this test can be used) for the duration of the COVID-19 declaration under Section 564(b)(1) of the Act, 21 U.S.C.section 360bbb-3(b)(1), unless the authorization is terminated  or revoked sooner.       Influenza A by PCR NEGATIVE NEGATIVE Final   Influenza B by PCR NEGATIVE NEGATIVE Final    Comment: (NOTE) The Xpert Xpress SARS-CoV-2/FLU/RSV plus assay is intended as an aid in the diagnosis of influenza from  Nasopharyngeal swab specimens and should not be used as a sole basis for treatment. Nasal washings and aspirates are unacceptable for Xpert Xpress SARS-CoV-2/FLU/RSV testing.  Fact Sheet for Patients: EntrepreneurPulse.com.au  Fact Sheet for Healthcare Providers: IncredibleEmployment.be  This test is not yet approved or cleared by the [place] FDA and has been authorized for detection and/or diagnosis of SARS-CoV-2 by FDA under an Emergency Use Authorization (EUA). This EUA will remain in effect (meaning this test can be used) for the duration of the COVID-19 declaration under Section 564(b)(1) of the Act, 21 U.S.C. section 360bbb-3(b)(1), unless the authorization is terminated or revoked.  Performed at [facility], [address], [place], [place] [address]   Blood culture (routine x 2)     Status: None   Collection Time: 10/29/20  2:35 PM   Specimen: BLOOD  Result Value Ref Range Status   Specimen Description   Final    BLOOD LEFT ANTECUBITAL Performed at [facility], [address] [place], [place] [address]    Special Requests   Final    BOTTLES DRAWN AEROBIC AND ANAEROBIC Blood Culture results may not be optimal due to an inadequate volume of blood received in culture bottles Performed at [facility], [address] [place], [place] [address]    Culture   Final    NO GROWTH  5 DAYS Performed at [facility], [place] [address], [place], [place] [address]    Report Status 11/03/2020 FINAL  Final  Aerobic/Anaerobic Culture w Gram Stain (surgical/deep wound)     Status: None (Preliminary result)   Collection Time: 11/01/20  2:14 PM   Specimen: PATH Soft tissue  Result Value Ref Range Status   Specimen Description   Final    TISSUE RIGHT FOOT Performed at [facility] [address], [place], [place] [address]    Special Requests   Final     NONE Performed at [facility], [place] [address], [place], [place] [address]    Gram Stain   Final    ABUNDANT WBC PRESENT, PREDOMINANTLY PMN RARE GRAM POSITIVE COCCI IN PAIRS FEW GRAM NEGATIVE RODS Performed at [facility], [place] [address], [place], [place] [address]    Culture   Final    ABUNDANT STENOTROPHOMONAS MALTOPHILIA FEW STAPHYLOCOCCUS AUREUS CULTURE REINCUBATED FOR BETTER GROWTH NO ANAEROBES ISOLATED; CULTURE IN PROGRESS FOR 5 DAYS    Report Status PENDING  Incomplete   Organism ID, Bacteria STENOTROPHOMONAS MALTOPHILIA  Final      Susceptibility   Stenotrophomonas maltophilia - MIC*    LEVOFLOXACIN 0.5 SENSITIVE Sensitive     TRIMETH/SULFA <=20 SENSITIVE Sensitive     * ABUNDANT STENOTROPHOMONAS MALTOPHILIA    Studies/Results: No results found.    Assessment/Plan:  INTERVAL HISTORY:   Stenotrophomonas isolated from culture   Active Problems:   Sepsis ([place])   Right foot infection   Anemia   HTN (hypertension)    [name] Price is a 80 y.o. female with  subacute onset of right foot ulcer that followed initial problems with allergies, scratching, then pedicure and a week later with pain, swelling and erythema. She was seen at [facility] and prescribed Keflex, but had worsening of necrotic like appearance to the wound and came back, treat again with cefazolin and had unroofing of her blister with bloody material that came out, briefly treated with steroids then with vancomycin and cefepime through number the 6, then discharged on Augmentin and doxycycline but apparent worsening of her wound.  Is again on broad-spectrum antibiotics in the form of vancomycin and cefepime.  And talk to the patient she does not have pain in her ankle and she never had fever or other systemic symptoms to suggest severe infection.   I do not think this is a pyogenic bacterial infection.  It could certainly be a manifestation of a nontuberculous  mycobacteria given her recent pedicure, or another subacute organism such as nocardia as she did do gardening with her feet exposed in the time.  Prior to onset of illness.  This could also be an autoimmune phenomenon such as pyoderma gangrenosum  He has now undergone debridement by plastic surgery with cultures sent and also pathology sent.  I started her on Zyvox and imipenem in case we were dealing with the nocardia and to give her some coverage for typical bacteria though again I am highly skeptical that this is due to a typical bacterial infection.  Stenotrophomonas and Staphylococcus growing from cultures and she has been switched to Bactrim DS 2 BID  Former organism does NOT cause fulminant infections in my experience and is more an opportunist and seen when patients have had prolonged antibiotic exposure  Hopefully the path report can be informative re possible NTM,  Nocardia, fungi or NON infectious cause of her ulcer  I  spent 37 minutes with the patient including face to face counseling of the patient re her ulcer and differential diagnosis for this, personally reviewing updated cultures, CBC< BMP along with review of medical records before and during the visit and in coordination of her care.       LOS: 5 days   Alcide Evener 11/03/2020, 3:35 PM

## 2020-11-03 NOTE — Progress Notes (Signed)
PROGRESS NOTE    [name] Price  IHK:[id number] DOB: 03-22-1940 DOA: 10/29/2020 PCP: Tonia Ghent, MD    No chief complaint on file.   Brief Narrative:   80 year old lady with prior history of hypertension, depression presents with right ankle cellulitis. She was recently hospitalized from 8/27 till 9/7 for right ankle cellulitis, patient received IV antibiotics.  Orthopedic surgery was consulted and she underwent blister aspiration and eventually discharged home on 10/19/20.  Despite being on antibiotics patient continues to have right foot/ankle redness worsening for purulent discharge and ulceration.  She was admitted on 10/29/2020 and underwent an MRI which was negative for any deep abscesses.  Assessment & Plan:   Active Problems:   Sepsis ([place])   Right foot infection   Anemia   HTN (hypertension)   Right foot cellulitis/wound Sepsis ruled out MRI of the right ankle shows Very large necrotic ulceration involving the lower calf anterolaterally, the lateral ankle, and extending into the lateral dorsal forefoot at least as far as the MTP joints. No osteomyelitis or deep abscesses found. Small effusion of the posterior subtalar joint, without marrow edema along the bony surfaces to further indicate septic arthritis ID consulted, suggested differential could be an autoimmune phenomenon such as pyoderma gangrenosum versus Non- tuberculosis mycobacterial infection.  Auto immune work up so far is negative. CK wnl,  CRP is 4.2. sed rate is 48.  TSH wnl.  HIV screen is negative.  ABI reviewed.  Plastic surgery consulted and she underwent debridement with Integra on 11/01/2020. Plan for additional debridement, application of wound matrix and application of wound VAC on 11/08/2020 as per plastic surgery.  She was started on Primaxin on 11/02/2008 and Zyvox and discontinued on 11/03/20 Wound cultures growing  abundant Stenotrophomonas Maltophilia and bactrim was added to the  regimen.   Essential hypertension Blood pressure parameters are well controlled.  Continue with metoprolol   History of depression Patient on Paxil,  continue the same    Acute anemia of blood loss superimposed on anemia of chronic disease/iron deficiency anemia Iron supplementation on board and 1 unit of  PRBC transfusion ordered. Recheck hemoglobin shows improvement to 8.2   DVT prophylaxis: scd's Code Status: Full code  family Communication: None at bedside Disposition:   Status is: Inpatient  Remains inpatient appropriate because:Ongoing diagnostic testing needed not appropriate for outpatient work up, Unsafe d/c plan, and IV treatments appropriate due to intensity of illness or inability to take PO  Dispo: The patient is from: Home              Anticipated d/c is to:  Pending              Patient currently is not medically stable to d/c.   Difficult to place patient No       Consultants:  Plastic surgery Dr. Claudia Desanctis Orthopedics Dr. Hiram Comber Infectious disease Dr. Drucilla Schmidt  Procedures: Debridement by plastic surgery on 11/02/2019  Antimicrobials:  Antibiotics Given (last 72 hours)     Date/Time Action Medication Dose Rate   11/01/20 1400 Given   ceFAZolin (ANCEF) IVPB 2g/100 mL premix 2 g    11/01/20 2320 Given   linezolid (ZYVOX) tablet 600 mg 600 mg    11/01/20 2323 New Bag/Given   imipenem-cilastatin (PRIMAXIN) 500 mg in sodium chloride 0.9 % 100 mL IVPB 500 mg 200 mL/hr   11/02/20 0626 New Bag/Given   imipenem-cilastatin (PRIMAXIN) 500 mg in sodium chloride 0.9 % 100 mL IVPB 500 mg 200 mL/hr  11/02/20 0928 Given   linezolid (ZYVOX) tablet 600 mg 600 mg    11/02/20 1445 New Bag/Given   imipenem-cilastatin (PRIMAXIN) 500 mg in sodium chloride 0.9 % 100 mL IVPB 500 mg 200 mL/hr   11/02/20 2107 Given   sulfamethoxazole-trimethoprim (BACTRIM DS) 800-160 MG per tablet 2 tablet 2 tablet    11/03/20 0848 Given   sulfamethoxazole-trimethoprim (BACTRIM DS)  800-160 MG per tablet 2 tablet 2 tablet          Subjective: Pain is well controlled.   Objective: Vitals:   11/02/20 1414 11/02/20 2111 11/03/20 0515 11/03/20 1350  BP: 110/60 127/85 (!) 120/52 112/65  Pulse: 87 (!) 104 80 87  Resp: 16 18 14 18   Temp: 98.8 F (37.1 C) 98.5 F (36.9 C) 98.5 F (36.9 C) 99.2 F (37.3 C)  TempSrc: Oral Oral Oral Oral  SpO2: 93% 96% 92% 98%  Weight:      Height:        Intake/Output Summary (Last 24 hours) at 11/03/2020 1357 Last data filed at 11/03/2020 1223 Gross per 24 hour  Intake 1224 ml  Output --  Net 1224 ml    Filed Weights   10/29/20 1318 11/01/20 1216  Weight: 64.9 kg 64.9 kg    Examination:  General exam: Appears calm and comfortable  Respiratory system: Clear to auscultation. Respiratory effort normal. Cardiovascular system: S1 & S2 heard, RRR. No JVD,  No pedal edema. Gastrointestinal system: Abdomen is nondistended, soft and nontender.  Normal bowel sounds heard. Central nervous system: Alert and oriented. No focal neurological deficits. Extremities: right foot is bandaged.  Skin: no pedal edema on the left.  Psychiatry: Mood & affect appropriate.      Data Reviewed: I have personally reviewed following labs and imaging studies  CBC: Recent Labs  Lab 10/29/20 1441 10/29/20 2149 10/30/20 0627 10/31/20 0445 11/02/20 0501 11/03/20 0942  WBC 7.3 6.7 4.8 4.6 6.8 7.5  NEUTROABS 5.8  --  2.9  --   --  4.9  HGB 8.5* 8.4* 7.6* 7.8* 6.8* 8.2*  HCT 26.5* 25.8* 23.4* 25.2* 21.7* 25.9*  MCV 95.7 96.3 96.7 96.6 96.9 95.6  PLT 330 325 306 344 363 397     Basic Metabolic Panel: Recent Labs  Lab 10/29/20 1441 10/29/20 2149 10/30/20 0627 10/31/20 0445 11/02/20 0501 11/03/20 0942  NA 137  --  139 138 135 142  K 4.0  --  4.5 3.7 4.8 4.3  CL 102  --  106 106 104 107  CO2 26  --  27 26 26 26   GLUCOSE 108*  --  106* 105* 134* 92  BUN 17  --  13 14 17 20   CREATININE 0.56  --  0.50 0.52 0.55 0.64  CALCIUM  8.7*  --  8.0* 7.9* 8.1* 8.6*  MG  --  1.9 1.8  --   --   --   PHOS  --  3.3 3.2  --   --   --      GFR: Estimated Creatinine Clearance: 53.4 mL/min (by C-G formula based on SCr of 0.64 mg/dL).  Liver Function Tests: Recent Labs  Lab 10/29/20 1441 10/30/20 0627  AST 12* 12*  ALT 7 9  ALKPHOS 53 45  BILITOT 0.5 0.6  PROT 5.9* 5.2*  ALBUMIN 3.0* 2.2*     CBG: No results for input(s): GLUCAP in the last 168 hours.   Recent Results (from the past 240 hour(s))  Blood culture (routine x 2)  Status: None   Collection Time: 10/29/20  2:30 PM   Specimen: BLOOD  Result Value Ref Range Status   Specimen Description   Final    BLOOD RIGHT ANTECUBITAL Performed at [facility], [address] [place], [place] [address]    Special Requests   Final    BOTTLES DRAWN AEROBIC AND ANAEROBIC Blood Culture results may not be optimal due to an inadequate volume of blood received in culture bottles Performed at [facility], [address] [place], [place] [address]    Culture   Final    NO GROWTH 5 DAYS Performed at [facility], [place] [address], [place], [place] [address]    Report Status 11/03/2020 FINAL  Final  Resp Panel by RT-PCR (Flu A&B, Covid) Nasopharyngeal Swab     Status: None   Collection Time: 10/29/20  2:32 PM   Specimen: Nasopharyngeal Swab; Nasopharyngeal(NP) swabs in vial transport medium  Result Value Ref Range Status   SARS Coronavirus 2 by RT PCR NEGATIVE NEGATIVE Final    Comment: (NOTE) SARS-CoV-2 target nucleic acids are NOT DETECTED.  The SARS-CoV-2 RNA is generally detectable in upper respiratory specimens during the acute phase of infection. The lowest concentration of SARS-CoV-2 viral copies this assay can detect is 138 copies/mL. A negative result does not preclude SARS-Cov-2 infection and should not be used as the sole basis for treatment or other patient management decisions. A negative result may  occur with  improper specimen collection/handling, submission of specimen other than nasopharyngeal swab, presence of viral mutation(s) within the areas targeted by this assay, and inadequate number of viral copies(<138 copies/mL). A negative result must be combined with clinical observations, patient history, and epidemiological information. The expected result is Negative.  Fact Sheet for Patients:  EntrepreneurPulse.com.au  Fact Sheet for Healthcare Providers:  IncredibleEmployment.be  This test is no t yet approved or cleared by the [place] FDA and  has been authorized for detection and/or diagnosis of SARS-CoV-2 by FDA under an Emergency Use Authorization (EUA). This EUA will remain  in effect (meaning this test can be used) for the duration of the COVID-19 declaration under Section 564(b)(1) of the Act, 21 U.S.C.section 360bbb-3(b)(1), unless the authorization is terminated  or revoked sooner.       Influenza A by PCR NEGATIVE NEGATIVE Final   Influenza B by PCR NEGATIVE NEGATIVE Final    Comment: (NOTE) The Xpert Xpress SARS-CoV-2/FLU/RSV plus assay is intended as an aid in the diagnosis of influenza from Nasopharyngeal swab specimens and should not be used as a sole basis for treatment. Nasal washings and aspirates are unacceptable for Xpert Xpress SARS-CoV-2/FLU/RSV testing.  Fact Sheet for Patients: EntrepreneurPulse.com.au  Fact Sheet for Healthcare Providers: IncredibleEmployment.be  This test is not yet approved or cleared by the [place] FDA and has been authorized for detection and/or diagnosis of SARS-CoV-2 by FDA under an Emergency Use Authorization (EUA). This EUA will remain in effect (meaning this test can be used) for the duration of the COVID-19 declaration under Section 564(b)(1) of the Act, 21 U.S.C. section 360bbb-3(b)(1), unless the authorization is terminated  or revoked.  Performed at [facility], [address], [place], [place] [address]   Blood culture (routine x 2)     Status: None   Collection Time: 10/29/20  2:35 PM   Specimen: BLOOD  Result Value Ref Range Status   Specimen Description   Final    BLOOD LEFT ANTECUBITAL Performed at [facility], [address], [place], [place] [address]    Special Requests   Final    BOTTLES DRAWN AEROBIC AND ANAEROBIC Blood Culture results may not be optimal due to an inadequate volume of blood received in culture bottles Performed at [facility], [address] [place], [place] [address]    Culture   Final    NO GROWTH 5 DAYS Performed at [facility], [place] [address] [place], [place] [address]    Report Status 11/03/2020 FINAL  Final  Aerobic/Anaerobic Culture w Gram Stain (surgical/deep wound)     Status: None (Preliminary result)   Collection Time: 11/01/20  2:14 PM   Specimen: PATH Soft tissue  Result Value Ref Range Status   Specimen Description   Final    TISSUE RIGHT FOOT Performed at [facility] [address], [place], [place] [address]    Special Requests   Final    NONE Performed at [facility], [place] [address] [place], [place] [address]    Gram Stain   Final    ABUNDANT WBC PRESENT, PREDOMINANTLY PMN RARE GRAM POSITIVE COCCI IN PAIRS FEW GRAM NEGATIVE RODS Performed at [facility], [place] [address], [place], [place] [address]    Culture   Final    ABUNDANT STENOTROPHOMONAS MALTOPHILIA FEW STAPHYLOCOCCUS AUREUS CULTURE REINCUBATED FOR BETTER GROWTH NO ANAEROBES ISOLATED; CULTURE IN PROGRESS FOR 5 DAYS    Report Status PENDING  Incomplete   Organism ID, Bacteria STENOTROPHOMONAS MALTOPHILIA  Final      Susceptibility   Stenotrophomonas maltophilia - MIC*    LEVOFLOXACIN 0.5 SENSITIVE Sensitive     TRIMETH/SULFA <=20 SENSITIVE Sensitive     * ABUNDANT  STENOTROPHOMONAS MALTOPHILIA          Radiology Studies: No results found.      Scheduled Meds:  vitamin C  250 mg Oral BID   feeding supplement  237 mL Oral BID BM   ferrous gluconate  324 mg Oral BID WC   metoprolol tartrate  12.5 mg Oral BID   multivitamin with minerals  1 tablet Oral Daily   pantoprazole  40 mg Oral BID   PARoxetine  25 mg Oral q morning   sulfamethoxazole-trimethoprim  2 tablet Oral Q12H   Continuous Infusions:     LOS: 5 days    Time spent: 38 minutes.     Hosie Poisson, MD [facility]   To contact the attending provider between 7A-7P or the covering provider during after hours 7P-7A, please log into the web site [email or web address] and access using universal [place] password for that web site. If you do not have the password, please call the hospital operator.  11/03/2020, 1:57 PM

## 2020-11-04 ENCOUNTER — Ambulatory Visit: Payer: Medicare Other | Admitting: Family Medicine

## 2020-11-04 DIAGNOSIS — M7989 Other specified soft tissue disorders: Secondary | ICD-10-CM | POA: Diagnosis not present

## 2020-11-04 DIAGNOSIS — L089 Local infection of the skin and subcutaneous tissue, unspecified: Secondary | ICD-10-CM | POA: Diagnosis not present

## 2020-11-04 DIAGNOSIS — I10 Essential (primary) hypertension: Secondary | ICD-10-CM | POA: Diagnosis not present

## 2020-11-04 LAB — ACID FAST SMEAR (AFB, MYCOBACTERIA): Acid Fast Smear: NEGATIVE

## 2020-11-04 LAB — SURGICAL PATHOLOGY

## 2020-11-04 MED ORDER — SULFAMETHOXAZOLE-TRIMETHOPRIM 800-160 MG PO TABS
1.0000 | ORAL_TABLET | Freq: Two times a day (BID) | ORAL | Status: DC
  Administered 2020-11-04 – 2020-11-05 (×2): 1 via ORAL
  Filled 2020-11-04 (×2): qty 1

## 2020-11-04 NOTE — Progress Notes (Signed)
PROGRESS NOTE    [name] Price  YNW:[id number] DOB: 1940-05-04 DOA: 10/29/2020 PCP: Tonia Ghent, MD    No chief complaint on file.   Brief Narrative:   80 year old lady with prior history of hypertension, depression presents with right ankle cellulitis. She was recently hospitalized from 8/27 till 9/7 for right ankle cellulitis, patient received IV antibiotics.  Orthopedic surgery was consulted and she underwent blister aspiration and eventually discharged home on 10/19/20.  Despite being on antibiotics patient continues to have right foot/ankle redness worsening for purulent discharge and ulceration.  She was admitted on 10/29/2020 and underwent an MRI which was negative for any deep abscesses.  Assessment & Plan:   Active Problems:   Sepsis ([place])   Right foot infection   Anemia   HTN (hypertension)   Right foot cellulitis/wound Sepsis ruled out MRI of the right ankle shows Very large necrotic ulceration involving the lower calf anterolaterally, the lateral ankle, and extending into the lateral dorsal forefoot at least as far as the MTP joints. No osteomyelitis or deep abscesses found. Small effusion of the posterior subtalar joint, without marrow edema along the bony surfaces to further indicate septic arthritis ID consulted, suggested differential could be an autoimmune phenomenon such as pyoderma gangrenosum versus Non- tuberculosis mycobacterial infection.  Auto immune work up so far is negative. CK wnl,  CRP is 4.2. sed rate is 48.  TSH wnl.  HIV screen is negative.  ABI reviewed.  Plastic surgery consulted and she underwent debridement with Integra on 11/01/2020. Plan for additional debridement, application of wound matrix and application of wound VAC on 11/08/2020 as per plastic surgery.  She was started on Primaxin on 11/02/2008 and Zyvox and discontinued on 11/03/20 Wound cultures growing  abundant Stenotrophomonas Maltophilia and bactrim was added to the  regimen. Reviewed images of the foot today. Low grade temp 99.2. Surgical pathology Inflamed and necrotic skin and subcutaneous tissue. No malignancy.  Essential hypertension Blood pressure parameters are optimal.  Continue with metoprolol   History of depression Patient on Paxil,  continue the same   Acute anemia of blood loss superimposed on anemia of chronic disease/iron deficiency anemia Iron supplementation on board and 1 unit of  PRBC transfusion ordered. Recheck hemoglobin shows improvement to 8.2 .   DVT prophylaxis: scd's Code Status: Full code  family Communication: None at bedside Disposition:   Status is: Inpatient  Remains inpatient appropriate because:Ongoing diagnostic testing needed not appropriate for outpatient work up, Unsafe d/c plan, and IV treatments appropriate due to intensity of illness or inability to take PO  Dispo: The patient is from: Home              Anticipated d/c is to:  Pending              Patient currently is not medically stable to d/c.   Difficult to place patient No       Consultants:  Plastic surgery Dr. Claudia Desanctis Orthopedics Dr. Hiram Comber Infectious disease Dr. Drucilla Schmidt  Procedures: Debridement by plastic surgery on 11/02/2019  Antimicrobials:  Antibiotics Given (last 72 hours)     Date/Time Action Medication Dose Rate   11/01/20 2320 Given   linezolid (ZYVOX) tablet 600 mg 600 mg    11/01/20 2323 New Bag/Given   imipenem-cilastatin (PRIMAXIN) 500 mg in sodium chloride 0.9 % 100 mL IVPB 500 mg 200 mL/hr   11/02/20 0626 New Bag/Given   imipenem-cilastatin (PRIMAXIN) 500 mg in sodium chloride 0.9 % 100 mL IVPB 500  mg 200 mL/hr   11/02/20 0928 Given   linezolid (ZYVOX) tablet 600 mg 600 mg    11/02/20 1445 New Bag/Given   imipenem-cilastatin (PRIMAXIN) 500 mg in sodium chloride 0.9 % 100 mL IVPB 500 mg 200 mL/hr   11/02/20 2107 Given   sulfamethoxazole-trimethoprim (BACTRIM DS) 800-160 MG per tablet 2 tablet 2 tablet    11/03/20  0848 Given   sulfamethoxazole-trimethoprim (BACTRIM DS) 800-160 MG per tablet 2 tablet 2 tablet    11/03/20 2126 Given   sulfamethoxazole-trimethoprim (BACTRIM DS) 800-160 MG per tablet 2 tablet 2 tablet    11/04/20 1009 Given   sulfamethoxazole-trimethoprim (BACTRIM DS) 800-160 MG per tablet 2 tablet 2 tablet          Subjective: Pain is well controlled.   Objective: Vitals:   11/03/20 1350 11/03/20 2105 11/04/20 0510 11/04/20 1517  BP: 112/65 (!) 118/50 (!) 155/87 (!) 113/51  Pulse: 87 93 88 73  Resp: 18 18 20 15   Temp: 99.2 F (37.3 C) 97.9 F (36.6 C) 98.1 F (36.7 C) 98.5 F (36.9 C)  TempSrc: Oral Oral Oral Oral  SpO2: 98% 95% 96% 91%  Weight:      Height:        Intake/Output Summary (Last 24 hours) at 11/04/2020 1538 Last data filed at 11/04/2020 1010 Gross per 24 hour  Intake 720 ml  Output --  Net 720 ml    Filed Weights   10/29/20 1318 11/01/20 1216  Weight: 64.9 kg 64.9 kg    Examination:  General exam: Appears calm and comfortable.  Respiratory system: air entry fair , no wheezing heard.  Cardiovascular system: S1 & S2 heard, RRR. No JVD,  No pedal edema. Gastrointestinal system: Abdomen is soft,NT ND BS+ Central nervous system: Alert and Oriented, non focal.  Extremities: see image of the right leg Skin: no pedal edema on the left.  Psychiatry: Mood is appropriate.       Data Reviewed: I have personally reviewed following labs and imaging studies  CBC: Recent Labs  Lab 10/29/20 1441 10/29/20 2149 10/30/20 0627 10/31/20 0445 11/02/20 0501 11/03/20 0942  WBC 7.3 6.7 4.8 4.6 6.8 7.5  NEUTROABS 5.8  --  2.9  --   --  4.9  HGB 8.5* 8.4* 7.6* 7.8* 6.8* 8.2*  HCT 26.5* 25.8* 23.4* 25.2* 21.7* 25.9*  MCV 95.7 96.3 96.7 96.6 96.9 95.6  PLT 330 325 306 344 363 397     Basic Metabolic Panel: Recent Labs  Lab 10/29/20 1441 10/29/20 2149 10/30/20 0627 10/31/20 0445 11/02/20 0501 11/03/20 0942  NA 137  --  139 138 135 142  K 4.0   --  4.5 3.7 4.8 4.3  CL 102  --  106 106 104 107  CO2 26  --  27 26 26 26   GLUCOSE 108*  --  106* 105* 134* 92  BUN 17  --  13 14 17 20   CREATININE 0.56  --  0.50 0.52 0.55 0.64  CALCIUM 8.7*  --  8.0* 7.9* 8.1* 8.6*  MG  --  1.9 1.8  --   --   --   PHOS  --  3.3 3.2  --   --   --      GFR: Estimated Creatinine Clearance: 53.4 mL/min (by C-G formula based on SCr of 0.64 mg/dL).  Liver Function Tests: Recent Labs  Lab 10/29/20 1441 10/30/20 0627  AST 12* 12*  ALT 7 9  ALKPHOS 53 45  BILITOT 0.5 0.6  PROT 5.9* 5.2*  ALBUMIN 3.0* 2.2*     CBG: No results for input(s): GLUCAP in the last 168 hours.   Recent Results (from the past 240 hour(s))  Blood culture (routine x 2)     Status: None   Collection Time: 10/29/20  2:30 PM   Specimen: BLOOD  Result Value Ref Range Status   Specimen Description   Final    BLOOD RIGHT ANTECUBITAL Performed at [facility], [address] [place], [place] [address]    Special Requests   Final    BOTTLES DRAWN AEROBIC AND ANAEROBIC Blood Culture results may not be optimal due to an inadequate volume of blood received in culture bottles Performed at [facility], [address] [place], [place] [address]    Culture   Final    NO GROWTH 5 DAYS Performed at [facility], [place] [address] [place], [place] [address]    Report Status 11/03/2020 FINAL  Final  Resp Panel by RT-PCR (Flu A&B, Covid) Nasopharyngeal Swab     Status: None   Collection Time: 10/29/20  2:32 PM   Specimen: Nasopharyngeal Swab; Nasopharyngeal(NP) swabs in vial transport medium  Result Value Ref Range Status   SARS Coronavirus 2 by RT PCR NEGATIVE NEGATIVE Final    Comment: (NOTE) SARS-CoV-2 target nucleic acids are NOT DETECTED.  The SARS-CoV-2 RNA is generally detectable in upper respiratory specimens during the acute phase of infection. The lowest concentration of SARS-CoV-2 viral copies this assay can detect  is 138 copies/mL. A negative result does not preclude SARS-Cov-2 infection and should not be used as the sole basis for treatment or other patient management decisions. A negative result may occur with  improper specimen collection/handling, submission of specimen other than nasopharyngeal swab, presence of viral mutation(s) within the areas targeted by this assay, and inadequate number of viral copies(<138 copies/mL). A negative result must be combined with clinical observations, patient history, and epidemiological information. The expected result is Negative.  Fact Sheet for Patients:  EntrepreneurPulse.com.au  Fact Sheet for Healthcare Providers:  IncredibleEmployment.be  This test is no t yet approved or cleared by the [place] FDA and  has been authorized for detection and/or diagnosis of SARS-CoV-2 by FDA under an Emergency Use Authorization (EUA). This EUA will remain  in effect (meaning this test can be used) for the duration of the COVID-19 declaration under Section 564(b)(1) of the Act, 21 U.S.C.section 360bbb-3(b)(1), unless the authorization is terminated  or revoked sooner.       Influenza A by PCR NEGATIVE NEGATIVE Final   Influenza B by PCR NEGATIVE NEGATIVE Final    Comment: (NOTE) The Xpert Xpress SARS-CoV-2/FLU/RSV plus assay is intended as an aid in the diagnosis of influenza from Nasopharyngeal swab specimens and should not be used as a sole basis for treatment. Nasal washings and aspirates are unacceptable for Xpert Xpress SARS-CoV-2/FLU/RSV testing.  Fact Sheet for Patients: EntrepreneurPulse.com.au  Fact Sheet for Healthcare Providers: IncredibleEmployment.be  This test is not yet approved or cleared by the [place] FDA and has been authorized for detection and/or diagnosis of SARS-CoV-2 by FDA under an Emergency Use Authorization (EUA). This EUA will remain in effect  (meaning this test can be used) for the duration of the COVID-19 declaration under Section 564(b)(1) of the Act, 21 U.S.C. section 360bbb-3(b)(1), unless the authorization is terminated or revoked.  Performed at [facility], [address], [place],  [address]   Blood culture (routine x 2)  Status: None   Collection Time: 10/29/20  2:35 PM   Specimen: BLOOD  Result Value Ref Range Status   Specimen Description   Final    BLOOD LEFT ANTECUBITAL Performed at [facility], [address] [place], [place] [address]    Special Requests   Final    BOTTLES DRAWN AEROBIC AND ANAEROBIC Blood Culture results may not be optimal due to an inadequate volume of blood received in culture bottles Performed at [facility], [address] [place], [place] [address]    Culture   Final    NO GROWTH 5 DAYS Performed at [facility], [place] [address], [place], [place] [address]    Report Status 11/03/2020 FINAL  Final  Aerobic/Anaerobic Culture w Gram Stain (surgical/deep wound)     Status: None (Preliminary result)   Collection Time: 11/01/20  2:14 PM   Specimen: PATH Soft tissue  Result Value Ref Range Status   Specimen Description   Final    TISSUE RIGHT FOOT Performed at [facility] [address] [place], [place] [address]    Special Requests   Final    NONE Performed at [facility], [place] [address], [place], [place] [address]    Gram Stain   Final    ABUNDANT WBC PRESENT, PREDOMINANTLY PMN RARE GRAM POSITIVE COCCI IN PAIRS FEW GRAM NEGATIVE RODS Performed at [facility], [place] [address] [place], [place] [address]    Culture   Final    ABUNDANT STENOTROPHOMONAS MALTOPHILIA FEW STAPHYLOCOCCUS AUREUS SUSCEPTIBILITIES TO FOLLOW NO ANAEROBES ISOLATED; CULTURE IN PROGRESS FOR 5 DAYS    Report Status PENDING  Incomplete   Organism ID, Bacteria STENOTROPHOMONAS  MALTOPHILIA  Final      Susceptibility   Stenotrophomonas maltophilia - MIC*    LEVOFLOXACIN 0.5 SENSITIVE Sensitive     TRIMETH/SULFA <=20 SENSITIVE Sensitive     * ABUNDANT STENOTROPHOMONAS MALTOPHILIA          Radiology Studies: No results found.      Scheduled Meds:  vitamin C  250 mg Oral BID   feeding supplement  237 mL Oral BID BM   ferrous gluconate  324 mg Oral BID WC   metoprolol tartrate  12.5 mg Oral BID   multivitamin with minerals  1 tablet Oral Daily   pantoprazole  40 mg Oral BID   PARoxetine  25 mg Oral q morning   sulfamethoxazole-trimethoprim  1 tablet Oral Q12H   Continuous Infusions:     LOS: 6 days    Time spent: 38 minutes.     Hosie Poisson, MD [facility]   To contact the attending provider between 7A-7P or the covering provider during after hours 7P-7A, please log into the web site [email or web address] and access using universal [place] password for that web site. If you do not have the password, please call the hospital operator.  11/04/2020, 3:38 PM

## 2020-11-04 NOTE — Progress Notes (Signed)
Subjective:  He is feeling better and showed me a photo of her foot with the dressing off with no more necrotic tissue present that I could appreciate  Antibiotics:  Anti-infectives (From admission, onward)    Start     Dose/Rate Route Frequency Ordered Stop   11/04/20 2200  sulfamethoxazole-trimethoprim (BACTRIM DS) 800-160 MG per tablet 1 tablet        1 tablet Oral Every 12 hours 11/04/20 1408     11/02/20 2200  sulfamethoxazole-trimethoprim (BACTRIM DS) 800-160 MG per tablet 2 tablet  Status:  Discontinued        2 tablet Oral Every 12 hours 11/02/20 2010 11/04/20 1408   11/02/20 0600  ceFAZolin (ANCEF) IVPB 2g/100 mL premix        2 g 200 mL/hr over 30 Minutes Intravenous On call to O.R. 11/01/20 1205 11/01/20 1410   11/01/20 2245  imipenem-cilastatin (PRIMAXIN) 500 mg in sodium chloride 0.9 % 100 mL IVPB  Status:  Discontinued        500 mg 200 mL/hr over 30 Minutes Intravenous Every 8 hours 11/01/20 2156 11/02/20 2010   11/01/20 2230  linezolid (ZYVOX) tablet 600 mg  Status:  Discontinued        600 mg Oral Every 12 hours 11/01/20 2143 11/02/20 2010   10/31/20 0500  vancomycin (VANCOCIN) IVPB 1000 mg/200 mL premix  Status:  Discontinued        1,000 mg 200 mL/hr over 60 Minutes Intravenous Every 24 hours 10/30/20 1057 10/31/20 1108   10/30/20 0500  vancomycin (VANCOREADY) IVPB 1250 mg/250 mL  Status:  Discontinued        1,250 mg 166.7 mL/hr over 90 Minutes Intravenous Every 24 hours 10/29/20 2344 10/30/20 1057   10/30/20 0400  ceFEPIme (MAXIPIME) 2 g in sodium chloride 0.9 % 100 mL IVPB  Status:  Discontinued        2 g 200 mL/hr over 30 Minutes Intravenous Every 12 hours 10/29/20 2344 10/31/20 1108   10/29/20 1630  vancomycin (VANCOCIN) IVPB 1000 mg/200 mL premix        1,000 mg 200 mL/hr over 60 Minutes Intravenous  Once 10/29/20 1614 10/29/20 1911   10/29/20 1615  ceFEPIme (MAXIPIME) 2 g in sodium chloride 0.9 % 100 mL IVPB        2 g 200 mL/hr over 30  Minutes Intravenous  Once 10/29/20 1614 10/29/20 1911       Medications: Scheduled Meds:  vitamin C  250 mg Oral BID   feeding supplement  237 mL Oral BID BM   ferrous gluconate  324 mg Oral BID WC   metoprolol tartrate  12.5 mg Oral BID   multivitamin with minerals  1 tablet Oral Daily   pantoprazole  40 mg Oral BID   PARoxetine  25 mg Oral q morning   sulfamethoxazole-trimethoprim  1 tablet Oral Q12H   Continuous Infusions:   PRN Meds:.acetaminophen **OR** acetaminophen, fentaNYL (SUBLIMAZE) injection    Objective: Weight change:   Intake/Output Summary (Last 24 hours) at 11/04/2020 1413 Last data filed at 11/04/2020 1010 Gross per 24 hour  Intake 720 ml  Output --  Net 720 ml    Blood pressure (!) 155/87, pulse 88, temperature 98.1 F (36.7 C), temperature source Oral, resp. rate 20, height 5\' 3"  (1.6 m), weight 64.9 kg, SpO2 96 %. Temp:  [97.9 F (36.6 C)-98.1 F (36.7 C)] 98.1 F (36.7 C) (09/23 0510) Pulse Rate:  [88-93] 88 (  09/23 0510) Resp:  [18-20] 20 (09/23 0510) BP: (118-155)/(50-87) 155/87 (09/23 0510) SpO2:  [95 %-96 %] 96 % (09/23 0510)  Physical Exam: Physical Exam Constitutional:      General: She is not in acute distress.    Appearance: She is well-developed. She is not diaphoretic.  HENT:     Head: Normocephalic and atraumatic.     Right Ear: External ear normal.     Left Ear: External ear normal.     Mouth/Throat:     Pharynx: No oropharyngeal exudate.  Eyes:     General: No scleral icterus.    Conjunctiva/sclera: Conjunctivae normal.     Pupils: Pupils are equal, round, and reactive to light.  Cardiovascular:     Rate and Rhythm: Normal rate and regular rhythm.     Heart sounds:    No gallop.  Pulmonary:     Effort: Pulmonary effort is normal. No respiratory distress.     Breath sounds: No wheezing.  Abdominal:     General: Bowel sounds are normal. There is no distension.     Palpations: Abdomen is soft.     Tenderness: There is  no abdominal tenderness. There is no rebound.  Musculoskeletal:        General: No tenderness. Normal range of motion.  Lymphadenopathy:     Cervical: No cervical adenopathy.  Skin:    General: Skin is warm and dry.     Findings: No erythema.  Neurological:     General: No focal deficit present.     Mental Status: She is alert and oriented to person, place, and time.     Motor: No abnormal muscle tone.  Psychiatric:        Mood and Affect: Mood normal.        Behavior: Behavior normal.        Thought Content: Thought content normal.        Judgment: Judgment normal.     Picture of her leg that she took after her hospital discharge:       Picture of leg  10/31/2020      Foot bandaged  CBC:    BMET Recent Labs    11/02/20 0501 11/03/20 0942  NA 135 142  K 4.8 4.3  CL 104 107  CO2 26 26  GLUCOSE 134* 92  BUN 17 20  CREATININE 0.55 0.64  CALCIUM 8.1* 8.6*      Liver Panel  No results for input(s): PROT, ALBUMIN, AST, ALT, ALKPHOS, BILITOT, BILIDIR, IBILI in the last 72 hours.      Sedimentation Rate No results for input(s): ESRSEDRATE in the last 72 hours.  C-Reactive Protein No results for input(s): CRP in the last 72 hours.   Micro Results: Recent Results (from the past 720 hour(s))  Resp Panel by RT-PCR (Flu A&B, Covid) Nasopharyngeal Swab     Status: None   Collection Time: 10/08/20  1:57 PM   Specimen: Nasopharyngeal Swab; Nasopharyngeal(NP) swabs in vial transport medium  Result Value Ref Range Status   SARS Coronavirus 2 by RT PCR NEGATIVE NEGATIVE Final    Comment: (NOTE) SARS-CoV-2 target nucleic acids are NOT DETECTED.  The SARS-CoV-2 RNA is generally detectable in upper respiratory specimens during the acute phase of infection. The lowest concentration of SARS-CoV-2 viral copies this assay can detect is 138 copies/mL. A negative result does not preclude SARS-Cov-2 infection and should not be used as the sole basis for treatment  or other patient management decisions. A negative  result may occur with  improper specimen collection/handling, submission of specimen other than nasopharyngeal swab, presence of viral mutation(s) within the areas targeted by this assay, and inadequate number of viral copies(<138 copies/mL). A negative result must be combined with clinical observations, patient history, and epidemiological information. The expected result is Negative.  Fact Sheet for Patients:  EntrepreneurPulse.com.au  Fact Sheet for Healthcare Providers:  IncredibleEmployment.be  This test is no t yet approved or cleared by the [place] FDA and  has been authorized for detection and/or diagnosis of SARS-CoV-2 by FDA under an Emergency Use Authorization (EUA). This EUA will remain  in effect (meaning this test can be used) for the duration of the COVID-19 declaration under Section 564(b)(1) of the Act, 21 U.S.C.section 360bbb-3(b)(1), unless the authorization is terminated  or revoked sooner.       Influenza A by PCR NEGATIVE NEGATIVE Final   Influenza B by PCR NEGATIVE NEGATIVE Final    Comment: (NOTE) The Xpert Xpress SARS-CoV-2/FLU/RSV plus assay is intended as an aid in the diagnosis of influenza from Nasopharyngeal swab specimens and should not be used as a sole basis for treatment. Nasal washings and aspirates are unacceptable for Xpert Xpress SARS-CoV-2/FLU/RSV testing.  Fact Sheet for Patients: EntrepreneurPulse.com.au  Fact Sheet for Healthcare Providers: IncredibleEmployment.be  This test is not yet approved or cleared by the [place] FDA and has been authorized for detection and/or diagnosis of SARS-CoV-2 by FDA under an Emergency Use Authorization (EUA). This EUA will remain in effect (meaning this test can be used) for the duration of the COVID-19 declaration under Section 564(b)(1) of the Act, 21 U.S.C. section  360bbb-3(b)(1), unless the authorization is terminated or revoked.  Performed at [facility], [place] [address], [place], [place] [address]   Blood Culture (routine x 2)     Status: None   Collection Time: 10/08/20  2:05 PM   Specimen: BLOOD  Result Value Ref Range Status   Specimen Description BLOOD LEFT ANTECUBITAL  Final   Special Requests   Final    BOTTLES DRAWN AEROBIC AND ANAEROBIC Blood Culture adequate volume   Culture   Final    NO GROWTH 5 DAYS Performed at [facility], [place] [address], [place], [place] [address]    Report Status 10/13/2020 FINAL  Final  Blood Culture (routine x 2)     Status: None   Collection Time: 10/08/20  2:14 PM   Specimen: BLOOD  Result Value Ref Range Status   Specimen Description BLOOD SITE NOT SPECIFIED  Final   Special Requests   Final    BOTTLES DRAWN AEROBIC AND ANAEROBIC Blood Culture results may not be optimal due to an inadequate volume of blood received in culture bottles   Culture   Final    NO GROWTH 5 DAYS Performed at [facility], [place] [address] [place], [place] [address]    Report Status 10/13/2020 FINAL  Final  C Difficile Quick Screen w PCR reflex     Status: None   Collection Time: 10/08/20  4:24 PM   Specimen: STOOL  Result Value Ref Range Status   C Diff antigen NEGATIVE NEGATIVE Final   C Diff toxin NEGATIVE NEGATIVE Final   C Diff interpretation No C. difficile detected.  Final    Comment: Performed at [facility], [place] [address] [place], [place] [address]  Blood culture (routine x 2)     Status: None   Collection Time: 10/29/20  2:30 PM   Specimen: BLOOD  Result Value Ref Range Status   Specimen Description   Final    BLOOD RIGHT ANTECUBITAL Performed at [facility], [address] [place], [place] [address]    Special Requests   Final    BOTTLES DRAWN AEROBIC AND ANAEROBIC Blood Culture results may not be optimal due to an inadequate volume of blood received in  culture bottles Performed at [facility], [address], [place], [place] [address]    Culture   Final    NO GROWTH 5 DAYS Performed at [facility], [place] [address] [place], [place] [address]    Report Status 11/03/2020 FINAL  Final  Resp Panel by RT-PCR (Flu A&B, Covid) Nasopharyngeal Swab     Status: None   Collection Time: 10/29/20  2:32 PM   Specimen: Nasopharyngeal Swab; Nasopharyngeal(NP) swabs in vial transport medium  Result Value Ref Range Status   SARS Coronavirus 2 by RT PCR NEGATIVE NEGATIVE Final    Comment: (NOTE) SARS-CoV-2 target nucleic acids are NOT DETECTED.  The SARS-CoV-2 RNA is generally detectable in upper respiratory specimens during the acute phase of infection. The lowest concentration of SARS-CoV-2 viral copies this assay can detect is 138 copies/mL. A negative result does not preclude SARS-Cov-2 infection and should not be used as the sole basis for treatment or other patient management decisions. A negative result may occur with  improper specimen collection/handling, submission of specimen other than nasopharyngeal swab, presence of viral mutation(s) within the areas targeted by this assay, and inadequate number of viral copies(<138 copies/mL). A negative result must be combined with clinical observations, patient history, and epidemiological information. The expected result is Negative.  Fact Sheet for Patients:  EntrepreneurPulse.com.au  Fact Sheet for Healthcare Providers:  IncredibleEmployment.be  This test is no t yet approved or cleared by the [place] FDA and  has been authorized for detection and/or diagnosis of SARS-CoV-2 by FDA under an Emergency Use Authorization (EUA). This EUA will remain  in effect (meaning this test can be used) for the duration of the COVID-19 declaration under Section 564(b)(1) of the Act, 21 U.S.C.section 360bbb-3(b)(1), unless the authorization  is terminated  or revoked sooner.       Influenza A by PCR NEGATIVE NEGATIVE Final   Influenza B by PCR NEGATIVE NEGATIVE Final    Comment: (NOTE) The Xpert Xpress SARS-CoV-2/FLU/RSV plus assay is intended as an aid in the diagnosis of influenza from Nasopharyngeal swab specimens and should not be used as a sole basis for treatment. Nasal washings and aspirates are unacceptable for Xpert Xpress SARS-CoV-2/FLU/RSV testing.  Fact Sheet for Patients: EntrepreneurPulse.com.au  Fact Sheet for Healthcare Providers: IncredibleEmployment.be  This test is not yet approved or cleared by the [place] FDA and has been authorized for detection and/or diagnosis of SARS-CoV-2 by FDA under an Emergency Use Authorization (EUA). This EUA will remain in effect (meaning this test can be used) for the duration of the COVID-19 declaration under Section 564(b)(1) of the Act, 21 U.S.C. section 360bbb-3(b)(1), unless the authorization is terminated or revoked.  Performed at [facility], [address] [place] [address] [place], [place] [address]   Blood culture (routine x 2)     Status: None   Collection Time: 10/29/20  2:35 PM   Specimen: BLOOD  Result Value Ref Range Status   Specimen Description   Final    BLOOD LEFT ANTECUBITAL Performed at [facility], [address] [place], [place] [address]  Special Requests   Final    BOTTLES DRAWN AEROBIC AND ANAEROBIC Blood Culture results may not be optimal due to an inadequate volume of blood received in culture bottles Performed at [facility], [address] [place], [place] [address]    Culture   Final    NO GROWTH 5 DAYS Performed at [facility], [place] [address], [place], [place] [address]    Report Status 11/03/2020 FINAL  Final  Aerobic/Anaerobic Culture w Gram Stain (surgical/deep wound)     Status: None (Preliminary result)   Collection Time:  11/01/20  2:14 PM   Specimen: PATH Soft tissue  Result Value Ref Range Status   Specimen Description   Final    TISSUE RIGHT FOOT Performed at [facility] [address] [place], [place] [address]    Special Requests   Final    NONE Performed at [facility], [place] [address], [place], [place] [address]    Gram Stain   Final    ABUNDANT WBC PRESENT, PREDOMINANTLY PMN RARE GRAM POSITIVE COCCI IN PAIRS FEW GRAM NEGATIVE RODS Performed at [facility], [place] [address], [place], [place] [address]    Culture   Final    ABUNDANT STENOTROPHOMONAS MALTOPHILIA FEW STAPHYLOCOCCUS AUREUS SUSCEPTIBILITIES TO FOLLOW NO ANAEROBES ISOLATED; CULTURE IN PROGRESS FOR 5 DAYS    Report Status PENDING  Incomplete   Organism ID, Bacteria STENOTROPHOMONAS MALTOPHILIA  Final      Susceptibility   Stenotrophomonas maltophilia - MIC*    LEVOFLOXACIN 0.5 SENSITIVE Sensitive     TRIMETH/SULFA <=20 SENSITIVE Sensitive     * ABUNDANT STENOTROPHOMONAS MALTOPHILIA    Studies/Results: No results found.    Assessment/Plan:  INTERVAL HISTORY:   Pathology came back with ulcerated skin and necrotic fat tissue   Active Problems:   Sepsis ([place])   Right foot infection   Anemia   HTN (hypertension)    [name] Price is a 80 y.o. female with  subacute onset of right foot ulcer that followed initial problems with allergies, scratching, then pedicure and a week later with pain, swelling and erythema. She was seen at [facility] and prescribed Keflex, but had worsening of necrotic like appearance to the wound and came back, treat again with cefazolin and had unroofing of her blister with bloody material that came out, briefly treated with steroids then with vancomycin and cefepime through number the 6, then discharged on Augmentin and doxycycline but apparent worsening of her wound.  Is again on broad-spectrum antibiotics in the form of vancomycin and  cefepime.  And talk to the patient she does not have pain in her ankle and she never had fever or other systemic symptoms to suggest severe infection.   I do not think this is a pyogenic bacterial infection.  It could certainly be a manifestation of a nontuberculous mycobacteria given her recent pedicure, or another subacute organism such as nocardia as she did do gardening with her feet exposed in the time.  Prior to onset of illness.  This could also be an autoimmune phenomenon such as pyoderma gangrenosum  He has now undergone debridement by plastic surgery with cultures sent and also pathology sent.  I started her on Zyvox and imipenem in case we were dealing with the nocardia and to give her some coverage for typical bacteria though again I am highly skeptical that this is due to a typical bacterial infection.  Stenotrophomonas and Staphylococcus growing from  cultures and she has been switched to Bactrim DS 2 BID  Former organism does NOT cause fulminant infections in my experience and is more an opportunist and seen when patients have had prolonged antibiotic exposure  Hopefully the path report can be informative re possible NTM,  Nocardia, fungi or NON infectious cause of her ulcer  Her path report shows ulceration and fat necrosis, I do not see that special stains done for AFB and fungi  Understands she may go back to the operating room again next week.  We will continue to follow-up her culture data with particular interest of the AFB stains and cultures.  I spent 36 minutes with the patient including  face to face counseling of the patient re her ulcer personally reviewing updated culture data pathology report, [facility] medical records in preparation for the visit and during the visit and in coordination of her care.   Dr Gale Journey is available this weekend and will take over the service on Monday.       LOS: 6 days   Alcide Evener 11/04/2020, 2:13 PM

## 2020-11-05 DIAGNOSIS — I10 Essential (primary) hypertension: Secondary | ICD-10-CM | POA: Diagnosis not present

## 2020-11-05 DIAGNOSIS — L089 Local infection of the skin and subcutaneous tissue, unspecified: Secondary | ICD-10-CM | POA: Diagnosis not present

## 2020-11-05 MED ORDER — LINEZOLID 600 MG PO TABS
600.0000 mg | ORAL_TABLET | Freq: Two times a day (BID) | ORAL | Status: DC
  Administered 2020-11-05 – 2020-11-10 (×11): 600 mg via ORAL
  Filled 2020-11-05 (×13): qty 1

## 2020-11-05 MED ORDER — CEFDINIR 300 MG PO CAPS
300.0000 mg | ORAL_CAPSULE | Freq: Two times a day (BID) | ORAL | Status: DC
  Administered 2020-11-05 – 2020-11-10 (×11): 300 mg via ORAL
  Filled 2020-11-05 (×11): qty 1

## 2020-11-05 NOTE — Progress Notes (Signed)
PROGRESS NOTE    [name] Price  NWG:[id number] DOB: 02-06-1941 DOA: 10/29/2020 PCP: Tonia Ghent, MD    No chief complaint on file.   Brief Narrative:   80 year old lady with prior history of hypertension, depression presents with right ankle cellulitis. She was recently hospitalized from 8/27 till 9/7 for right ankle cellulitis, patient received IV antibiotics.  Orthopedic surgery was consulted and she underwent blister aspiration and eventually discharged home on 10/19/20.  Despite being on antibiotics patient continues to have right foot/ankle redness worsening for purulent discharge and ulceration.  She was admitted on 10/29/2020 and underwent an MRI which was negative for any deep abscesses.  Assessment & Plan:   Active Problems:   Sepsis ([place])   Right foot infection   Anemia   HTN (hypertension)   Right foot cellulitis/wound Sepsis ruled out MRI of the right ankle shows Very large necrotic ulceration involving the lower calf anterolaterally, the lateral ankle, and extending into the lateral dorsal forefoot at least as far as the MTP joints. No osteomyelitis or deep abscesses found. Small effusion of the posterior subtalar joint, without marrow edema along the bony surfaces to further indicate septic arthritis ID consulted, suggested differential could be an autoimmune phenomenon such as pyoderma gangrenosum versus Non- tuberculosis mycobacterial infection.  Auto immune work up so far is negative. CK wnl,  CRP is 4.2. sed rate is 48.  TSH wnl.  HIV screen is negative.  ABI reviewed.  Plastic surgery consulted and she underwent debridement with Integra on 11/01/2020. Plan for additional debridement, application of wound matrix and application of wound VAC on 11/08/2020 as per plastic surgery.  She was started on Primaxin on 11/02/2008 and Zyvox and discontinued on 11/03/20 Wound cultures growing  abundant Stenotrophomonas Maltophilia and MRSA, resistant to bactrim.she was  transitioned to linezolid and cefnidir on 11/05/20 Reviewed images of the foot today.  Surgical pathology shows  Inflamed and necrotic skin and subcutaneous tissue. No malignancy.  Essential hypertension Blood pressure parameters are well controlled.  Continue with metoprolol 12.5 mg BID.    History of depression Patient on Paxil,  continue the same   Acute anemia of blood loss superimposed on anemia of chronic disease/iron deficiency anemia Iron supplementation on board and 1 unit of  PRBC transfusion ordered. Recheck hemoglobin shows improvement to 8.2 . Repeat labs for tomorrow will be ordered.    Gerd:  Continue with protonix.    DVT prophylaxis: scd's Code Status: Full code  family Communication: None at bedside Disposition:   Status is: Inpatient  Remains inpatient appropriate because:Ongoing diagnostic testing needed not appropriate for outpatient work up, Unsafe d/c plan, and IV treatments appropriate due to intensity of illness or inability to take PO  Dispo: The patient is from: Home              Anticipated d/c is to:  Pending              Patient currently is not medically stable to d/c.   Difficult to place patient No       Consultants:  Plastic surgery Dr. Claudia Desanctis Orthopedics Dr. Hiram Comber Infectious disease Dr. Drucilla Schmidt  Procedures: Debridement by plastic surgery on 11/02/2019  Antimicrobials:  Antibiotics Given (last 72 hours)     Date/Time Action Medication Dose   11/02/20 2107 Given   sulfamethoxazole-trimethoprim (BACTRIM DS) 800-160 MG per tablet 2 tablet 2 tablet   11/03/20 0848 Given   sulfamethoxazole-trimethoprim (BACTRIM DS) 800-160 MG per tablet 2 tablet 2  tablet   11/03/20 2126 Given   sulfamethoxazole-trimethoprim (BACTRIM DS) 800-160 MG per tablet 2 tablet 2 tablet   11/04/20 1009 Given   sulfamethoxazole-trimethoprim (BACTRIM DS) 800-160 MG per tablet 2 tablet 2 tablet   11/04/20 2146 Given   sulfamethoxazole-trimethoprim (BACTRIM DS)  800-160 MG per tablet 1 tablet 1 tablet   11/05/20 0945 Given   sulfamethoxazole-trimethoprim (BACTRIM DS) 800-160 MG per tablet 1 tablet 1 tablet   11/05/20 1449 Given   linezolid (ZYVOX) tablet 600 mg 600 mg   11/05/20 1449 Given   cefdinir (OMNICEF) capsule 300 mg 300 mg         Subjective: Pain is controlled.   Objective: Vitals:   11/04/20 1517 11/04/20 2055 11/05/20 0516 11/05/20 1402  BP: (!) 113/51 (!) 103/49 123/62 (!) 111/51  Pulse: 73 81 90 73  Resp: 15 19 19 15   Temp: 98.5 F (36.9 C) 98.6 F (37 C) 98.4 F (36.9 C) 98.4 F (36.9 C)  TempSrc: Oral Oral Oral Oral  SpO2: 91% 94% 93% 94%  Weight:      Height:        Intake/Output Summary (Last 24 hours) at 11/05/2020 1555 Last data filed at 11/05/2020 0830 Gross per 24 hour  Intake 480 ml  Output --  Net 480 ml    Filed Weights   10/29/20 1318 11/01/20 1216  Weight: 64.9 kg 64.9 kg    Examination:  General exam: Appears calm and comfortable  Respiratory system: Clear to auscultation. Respiratory effort normal. Cardiovascular system: S1 & S2 heard, RRR. No JVD,  No pedal edema. Gastrointestinal system: Abdomen is nondistended, soft and nontender.  Normal bowel sounds heard. Central nervous system: Alert and oriented. No focal neurological deficits. Extremities: see image below Skin: No rashes, lesions or ulcers Psychiatry:  Mood & affect appropriate.         Data Reviewed: I have personally reviewed following labs and imaging studies  CBC: Recent Labs  Lab 10/29/20 2149 10/30/20 0627 10/31/20 0445 11/02/20 0501 11/03/20 0942  WBC 6.7 4.8 4.6 6.8 7.5  NEUTROABS  --  2.9  --   --  4.9  HGB 8.4* 7.6* 7.8* 6.8* 8.2*  HCT 25.8* 23.4* 25.2* 21.7* 25.9*  MCV 96.3 96.7 96.6 96.9 95.6  PLT 325 306 344 363 397     Basic Metabolic Panel: Recent Labs  Lab 10/29/20 2149 10/30/20 0627 10/31/20 0445 11/02/20 0501 11/03/20 0942  NA  --  139 138 135 142  K  --  4.5 3.7 4.8 4.3  CL  --   106 106 104 107  CO2  --  27 26 26 26   GLUCOSE  --  106* 105* 134* 92  BUN  --  13 14 17 20   CREATININE  --  0.50 0.52 0.55 0.64  CALCIUM  --  8.0* 7.9* 8.1* 8.6*  MG 1.9 1.8  --   --   --   PHOS 3.3 3.2  --   --   --      GFR: Estimated Creatinine Clearance: 53.4 mL/min (by C-G formula based on SCr of 0.64 mg/dL).  Liver Function Tests: Recent Labs  Lab 10/30/20 0627  AST 12*  ALT 9  ALKPHOS 45  BILITOT 0.6  PROT 5.2*  ALBUMIN 2.2*     CBG: No results for input(s): GLUCAP in the last 168 hours.   Recent Results (from the past 240 hour(s))  Blood culture (routine x 2)     Status: None  Collection Time: 10/29/20  2:30 PM   Specimen: BLOOD  Result Value Ref Range Status   Specimen Description   Final    BLOOD RIGHT ANTECUBITAL Performed at [facility], [address] [place], [place] [address]    Special Requests   Final    BOTTLES DRAWN AEROBIC AND ANAEROBIC Blood Culture results may not be optimal due to an inadequate volume of blood received in culture bottles Performed at [facility], [address] [place], [place] [address]    Culture   Final    NO GROWTH 5 DAYS Performed at [facility], [place] [address], [place], [place] [address]    Report Status 11/03/2020 FINAL  Final  Resp Panel by RT-PCR (Flu A&B, Covid) Nasopharyngeal Swab     Status: None   Collection Time: 10/29/20  2:32 PM   Specimen: Nasopharyngeal Swab; Nasopharyngeal(NP) swabs in vial transport medium  Result Value Ref Range Status   SARS Coronavirus 2 by RT PCR NEGATIVE NEGATIVE Final    Comment: (NOTE) SARS-CoV-2 target nucleic acids are NOT DETECTED.  The SARS-CoV-2 RNA is generally detectable in upper respiratory specimens during the acute phase of infection. The lowest concentration of SARS-CoV-2 viral copies this assay can detect is 138 copies/mL. A negative result does not preclude SARS-Cov-2 infection and should not be used as the  sole basis for treatment or other patient management decisions. A negative result may occur with  improper specimen collection/handling, submission of specimen other than nasopharyngeal swab, presence of viral mutation(s) within the areas targeted by this assay, and inadequate number of viral copies(<138 copies/mL). A negative result must be combined with clinical observations, patient history, and epidemiological information. The expected result is Negative.  Fact Sheet for Patients:  EntrepreneurPulse.com.au  Fact Sheet for Healthcare Providers:  IncredibleEmployment.be  This test is no t yet approved or cleared by the [place] FDA and  has been authorized for detection and/or diagnosis of SARS-CoV-2 by FDA under an Emergency Use Authorization (EUA). This EUA will remain  in effect (meaning this test can be used) for the duration of the COVID-19 declaration under Section 564(b)(1) of the Act, 21 U.S.C.section 360bbb-3(b)(1), unless the authorization is terminated  or revoked sooner.       Influenza A by PCR NEGATIVE NEGATIVE Final   Influenza B by PCR NEGATIVE NEGATIVE Final    Comment: (NOTE) The Xpert Xpress SARS-CoV-2/FLU/RSV plus assay is intended as an aid in the diagnosis of influenza from Nasopharyngeal swab specimens and should not be used as a sole basis for treatment. Nasal washings and aspirates are unacceptable for Xpert Xpress SARS-CoV-2/FLU/RSV testing.  Fact Sheet for Patients: EntrepreneurPulse.com.au  Fact Sheet for Healthcare Providers: IncredibleEmployment.be  This test is not yet approved or cleared by the [place] FDA and has been authorized for detection and/or diagnosis of SARS-CoV-2 by FDA under an Emergency Use Authorization (EUA). This EUA will remain in effect (meaning this test can be used) for the duration of the COVID-19 declaration under Section 564(b)(1) of the  Act, 21 U.S.C. section 360bbb-3(b)(1), unless the authorization is terminated or revoked.  Performed at [facility], [address] [place], [place] [address]   Blood culture (routine x 2)     Status: None   Collection Time: 10/29/20  2:35 PM   Specimen: BLOOD  Result Value Ref Range Status   Specimen Description   Final    BLOOD LEFT ANTECUBITAL Performed at [facility], [address]  [place], [place], [place] [address]    Special Requests   Final    BOTTLES DRAWN AEROBIC AND ANAEROBIC Blood Culture results may not be optimal due to an inadequate volume of blood received in culture bottles Performed at [facility], [address] [place], [place] [address]    Culture   Final    NO GROWTH 5 DAYS Performed at [facility], [place] [address] [place], [place] [address]    Report Status 11/03/2020 FINAL  Final  Aerobic/Anaerobic Culture w Gram Stain (surgical/deep wound)     Status: None (Preliminary result)   Collection Time: 11/01/20  2:14 PM   Specimen: PATH Soft tissue  Result Value Ref Range Status   Specimen Description   Final    TISSUE RIGHT FOOT Performed at [facility] [address], [place], [place] [address]    Special Requests   Final    NONE Performed at [facility], [place] [address], [place], [place] [address]    Gram Stain   Final    ABUNDANT WBC PRESENT, PREDOMINANTLY PMN RARE GRAM POSITIVE COCCI IN PAIRS FEW GRAM NEGATIVE RODS Performed at [facility], [place] [address] [place], [place] [address]    Culture   Final    ABUNDANT STENOTROPHOMONAS MALTOPHILIA FEW METHICILLIN RESISTANT STAPHYLOCOCCUS AUREUS NO ANAEROBES ISOLATED; CULTURE IN PROGRESS FOR 5 DAYS    Report Status PENDING  Incomplete   Organism ID, Bacteria STENOTROPHOMONAS MALTOPHILIA  Final   Organism ID, Bacteria METHICILLIN RESISTANT STAPHYLOCOCCUS AUREUS  Final      Susceptibility    Methicillin resistant staphylococcus aureus - MIC*    CIPROFLOXACIN >=8 RESISTANT Resistant     ERYTHROMYCIN >=8 RESISTANT Resistant     GENTAMICIN <=0.5 SENSITIVE Sensitive     OXACILLIN >=4 RESISTANT Resistant     TETRACYCLINE <=1 SENSITIVE Sensitive     VANCOMYCIN <=0.5 SENSITIVE Sensitive     TRIMETH/SULFA >=320 RESISTANT Resistant     CLINDAMYCIN <=0.25 SENSITIVE Sensitive     RIFAMPIN <=0.5 SENSITIVE Sensitive     Inducible Clindamycin NEGATIVE Sensitive     * FEW METHICILLIN RESISTANT STAPHYLOCOCCUS AUREUS   Stenotrophomonas maltophilia - MIC*    LEVOFLOXACIN 0.5 SENSITIVE Sensitive     TRIMETH/SULFA <=20 SENSITIVE Sensitive     * ABUNDANT STENOTROPHOMONAS MALTOPHILIA  Acid Fast Smear (AFB)     Status: None   Collection Time: 11/01/20  2:14 PM   Specimen: PATH Soft tissue  Result Value Ref Range Status   AFB Specimen Processing Comment  Final    Comment: Tissue Grinding and Digestion/Decontamination   Acid Fast Smear Negative  Final    Comment: (NOTE) Performed At: [facility] [place], [place] [id number] Rush Farmer MD HU:[id number]    Source (AFB) TISSUE  Final    Comment: RIGHT FOOT Performed at [facility], [place] [address] [place], [place] [address]           Radiology Studies: No results found.      Scheduled Meds:  vitamin C  250 mg Oral BID   cefdinir  300 mg Oral Q12H   feeding supplement  237 mL Oral BID BM   ferrous gluconate  324 mg Oral BID WC   linezolid  600 mg Oral Q12H   metoprolol tartrate  12.5 mg Oral BID   multivitamin with minerals  1 tablet Oral Daily   pantoprazole  40 mg Oral BID   PARoxetine  25  mg Oral q morning   Continuous Infusions:     LOS: 7 days       Hosie Poisson, MD [facility]   To contact the attending provider between 7A-7P or the covering provider during after hours 7P-7A, please log into the web site [email or web address] and access using universal [facility] password for that web site. If you do not have the password, please call the hospital operator.  11/05/2020, 3:55 PM

## 2020-11-05 NOTE — Plan of Care (Signed)
  Problem: Clinical Measurements: Goal: Ability to maintain clinical measurements within normal limits will improve Outcome: Progressing Goal: Diagnostic test results will improve Outcome: Progressing   

## 2020-11-06 DIAGNOSIS — L089 Local infection of the skin and subcutaneous tissue, unspecified: Secondary | ICD-10-CM | POA: Diagnosis not present

## 2020-11-06 DIAGNOSIS — I10 Essential (primary) hypertension: Secondary | ICD-10-CM | POA: Diagnosis not present

## 2020-11-06 DIAGNOSIS — S91301A Unspecified open wound, right foot, initial encounter: Secondary | ICD-10-CM | POA: Diagnosis not present

## 2020-11-06 LAB — BASIC METABOLIC PANEL
Anion gap: 8 (ref 5–15)
BUN: 21 mg/dL (ref 8–23)
CO2: 26 mmol/L (ref 22–32)
Calcium: 8.8 mg/dL — ABNORMAL LOW (ref 8.9–10.3)
Chloride: 101 mmol/L (ref 98–111)
Creatinine, Ser: 0.83 mg/dL (ref 0.44–1.00)
GFR, Estimated: >60 mL/min (ref >60)
Glucose, Bld: 105 mg/dL — ABNORMAL HIGH (ref 70–99)
Potassium: 4.6 mmol/L (ref 3.5–5.1)
Sodium: 135 mmol/L (ref 135–145)

## 2020-11-06 LAB — CBC WITH DIFFERENTIAL/PLATELET
Abs Immature Granulocytes: 0.02 10*3/uL (ref 0.00–0.07)
Basophils Absolute: 0.1 10*3/uL (ref 0.0–0.1)
Basophils Relative: 1 %
Eosinophils Absolute: 0.1 10*3/uL (ref 0.0–0.5)
Eosinophils Relative: 1 %
HCT: 30.5 % — ABNORMAL LOW (ref 36.0–46.0)
Hemoglobin: 9.6 g/dL — ABNORMAL LOW (ref 12.0–15.0)
Immature Granulocytes: 0 %
Lymphocytes Relative: 21 %
Lymphs Abs: 1.4 10*3/uL (ref 0.7–4.0)
MCH: 30 pg (ref 26.0–34.0)
MCHC: 31.5 g/dL (ref 30.0–36.0)
MCV: 95.3 fL (ref 80.0–100.0)
Monocytes Absolute: 0.8 10*3/uL (ref 0.1–1.0)
Monocytes Relative: 12 %
Neutro Abs: 4.3 10*3/uL (ref 1.7–7.7)
Neutrophils Relative %: 65 %
Platelets: 452 10*3/uL — ABNORMAL HIGH (ref 150–400)
RBC: 3.2 MIL/uL — ABNORMAL LOW (ref 3.87–5.11)
RDW: 14.6 % (ref 11.5–15.5)
WBC: 6.6 10*3/uL (ref 4.0–10.5)
nRBC: 0 % (ref 0.0–0.2)

## 2020-11-06 MED ORDER — INFLUENZA VAC A&B SA ADJ QUAD 0.5 ML IM PRSY
0.5000 mL | PREFILLED_SYRINGE | INTRAMUSCULAR | Status: AC
  Administered 2020-11-07: 0.5 mL via INTRAMUSCULAR
  Filled 2020-11-06: qty 0.5

## 2020-11-06 NOTE — Progress Notes (Signed)
   11/06/20 1200  Mobility  Activity Refused mobility   Pt stated she walked with NT this morning about 525ft, and is independently ambulating in her room.    [facility] Acute Rehab Services Office: [phone]

## 2020-11-06 NOTE — Plan of Care (Signed)
  Problem: Clinical Measurements: Goal: Ability to maintain clinical measurements within normal limits will improve Outcome: Progressing   Problem: Activity: Goal: Risk for activity intolerance will decrease Outcome: Progressing   Problem: Pain Managment: Goal: General experience of comfort will improve Outcome: Progressing   Problem: Safety: Goal: Ability to remain free from injury will improve Outcome: Progressing   

## 2020-11-06 NOTE — Progress Notes (Signed)
[name] NOTE    SYMPHONIE [name]  IRS:[id number] DOB: 18-Dec-1940 DOA: 10/29/2020 PCP: Tonia Ghent, [name]    No chief complaint on file.   Brief Narrative:   80 year old lady with prior history of [name], [name] [name], [name] [name].  Orthopedic surgery was consulted and she underwent blister aspiration and eventually discharged home on 10/19/20.  Despite being on [name] [name] continues to have right foot/ankle redness worsening for purulent discharge and ulceration.  She was admitted on 10/29/2020 and underwent an MRI which was negative for any deep abscesses.   Assessment & Plan:   Active Problems:   Sepsis ([place])   Right foot infection   Anemia   HTN ([name])   Right foot [name]/wound Sepsis ruled out MRI of the right ankle shows Very large necrotic ulceration involving the lower calf anterolaterally, the lateral ankle, and extending into the lateral dorsal forefoot at least as far as the MTP joints. No osteomyelitis or deep abscesses found. Small effusion of the posterior subtalar joint, without marrow edema along the bony surfaces to further indicate septic arthritis ID consulted, suggested differential could be an autoimmune phenomenon such as pyoderma gangrenosum versus Non- tuberculosis mycobacterial infection.  Auto immune work up so far is negative. CK wnl,  CRP is 4.2. sed rate is 48.  TSH wnl.  HIV screen is negative.  ABI reviewed.  Plastic surgery consulted and she underwent debridement with Integra on 11/01/2020. Plan for additional debridement, application of wound matrix and application of wound VAC on 11/08/2020 as per plastic surgery.  She was started on Primaxin on 11/02/2008 and Zyvox and discontinued on 11/03/20 Wound cultures growing  abundant Stenotrophomonas Maltophilia and MRSA, resistant to bactrim.she was  transitioned to linezolid and cefnidir on 11/05/20 Reviewed images of the foot today.  Surgical pathology shows  Inflamed and necrotic skin and subcutaneous tissue. No malignancy. Pain control with fentanyl and typlenol.   Essential [name] Blood pressure parameters are optimal.  Continue with metoprolol 12.5 mg BID.    History of [name] [name] on Paxil,  continue the same   Acute anemia of blood loss superimposed on anemia of chronic disease/iron deficiency anemia Iron supplementation on board and 1 unit of  PRBC transfusion ordered. Recheck hemoglobin shows improvement to 8.2 . Repeat labs for tomorrow will be ordered.    Gerd:  Continue with protonix.    DVT prophylaxis: scd's Code Status: Full code  family Communication: None at bedside Disposition:   Status is: Inpatient  Remains inpatient appropriate because:Ongoing diagnostic testing needed not appropriate for outpatient work up, Unsafe d/c plan, and IV treatments appropriate due to intensity of illness or inability to take PO  Dispo: The [name] is from: Home              Anticipated d/c is to:  Pending              [name] currently is not medically stable to d/c.   Difficult to place [name] No       Consultants:  Plastic surgery Dr. Claudia Desanctis Orthopedics Dr. Hiram Comber Infectious disease Dr. Drucilla Schmidt  Procedures: Debridement by plastic surgery on 11/02/2019  Antimicrobials:  [name] Given (last 72 hours)     Date/Time Action Medication Dose   11/03/20 2126 Given   sulfamethoxazole-trimethoprim (BACTRIM DS) 800-160 MG per tablet 2 tablet 2 tablet   11/04/20 1009 Given   sulfamethoxazole-trimethoprim (BACTRIM DS)  800-160 MG per tablet 2 tablet 2 tablet   11/04/20 2146 Given   sulfamethoxazole-trimethoprim (BACTRIM DS) 800-160 MG per tablet 1 tablet 1 tablet   11/05/20 0945 Given   sulfamethoxazole-trimethoprim (BACTRIM DS) 800-160 MG per tablet 1 tablet 1 tablet   11/05/20 1449 Given    linezolid (ZYVOX) tablet 600 mg 600 mg   11/05/20 1449 Given   cefdinir (OMNICEF) capsule 300 mg 300 mg   11/05/20 2140 Given   cefdinir (OMNICEF) capsule 300 mg 300 mg   11/05/20 2141 Given   linezolid (ZYVOX) tablet 600 mg 600 mg   11/06/20 0813 Given   cefdinir (OMNICEF) capsule 300 mg 300 mg   11/06/20 1245 Given   linezolid (ZYVOX) tablet 600 mg 600 mg         Subjective: No new complaints, worked with PT today and ambulated in the hallway for 500 feet.    Objective: Vitals:   11/05/20 0516 11/05/20 1402 11/05/20 2043 11/06/20 0517  BP: 123/62 (!) 111/51 (!) 118/59 (!) 85/70  Pulse: 90 73 82 75  Resp: 19 15 14 14   Temp: 98.4 F (36.9 C) 98.4 F (36.9 C) 99.2 F (37.3 C) 98.6 F (37 C)  TempSrc: Oral Oral Oral Oral  SpO2: 93% 94% 97% 98%  Weight:      Height:        Intake/Output Summary (Last 24 hours) at 11/06/2020 1530 Last data filed at 11/06/2020 0810 Gross per 24 hour  Intake 480 ml  Output --  Net 480 ml    Filed Weights   10/29/20 1318 11/01/20 1216  Weight: 64.9 kg 64.9 kg    Examination:  General exam: Appears calm and comfortable  Respiratory system: Clear to auscultation. Respiratory effort normal. Cardiovascular system: S1 & S2 heard, RRR. No JVD, . No pedal edema. Gastrointestinal system: Abdomen is nondistended, soft and nontender. Normal bowel sounds heard. Central nervous system: Alert and oriented. No focal neurological deficits. Extremities: right foot ulcer.  Skin: see below.  Psychiatry:  Mood & affect appropriate.          Data Reviewed: I have personally reviewed following labs and imaging studies  CBC: Recent Labs  Lab 10/31/20 0445 11/02/20 0501 11/03/20 0942  WBC 4.6 6.8 7.5  NEUTROABS  --   --  4.9  HGB 7.8* 6.8* 8.2*  HCT 25.2* 21.7* 25.9*  MCV 96.6 96.9 95.6  PLT 344 363 397     Basic Metabolic Panel: Recent Labs  Lab 10/31/20 0445 11/02/20 0501 11/03/20 0942  NA 138 135 142  K 3.7 4.8 4.3  CL  106 104 107  CO2 26 26 26   GLUCOSE 105* 134* 92  BUN 14 17 20   CREATININE 0.52 0.55 0.64  CALCIUM 7.9* 8.1* 8.6*     GFR: Estimated Creatinine Clearance: 53.4 mL/min (by C-G formula based on SCr of 0.64 mg/dL).  Liver Function Tests: No results for input(s): AST, ALT, ALKPHOS, BILITOT, PROT, ALBUMIN in the last 168 hours.   CBG: No results for input(s): GLUCAP in the last 168 hours.   Recent Results (from the past 240 hour(s))  Blood culture (routine x 2)     Status: None   Collection Time: 10/29/20  2:30 PM   Specimen: BLOOD  Result Value Ref Range Status   Specimen Description   Final    BLOOD RIGHT ANTECUBITAL Performed at [facility], [address] [place], [place] [address]    Special Requests   Final  BOTTLES DRAWN AEROBIC AND ANAEROBIC Blood Culture results may not be optimal due to an inadequate volume of blood received in culture bottles Performed at [facility], [address] [place], [place] [address]    Culture   Final    NO GROWTH 5 DAYS Performed at [facility], [place] [address] [place], [place] [address]    Report Status 11/03/2020 FINAL  Final  Resp Panel by RT-PCR (Flu A&B, Covid) Nasopharyngeal Swab     Status: None   Collection Time: 10/29/20  2:32 PM   Specimen: Nasopharyngeal Swab; Nasopharyngeal(NP) swabs in vial transport medium  Result Value Ref Range Status   SARS Coronavirus 2 by RT PCR NEGATIVE NEGATIVE Final    Comment: (NOTE) SARS-CoV-2 target nucleic acids are NOT DETECTED.  The SARS-CoV-2 RNA is generally detectable in upper respiratory specimens during the acute phase of infection. The lowest concentration of SARS-CoV-2 viral copies this assay can detect is 138 copies/mL. A negative result does not preclude SARS-Cov-2 infection and should not be used as the sole basis for treatment or other [name] management decisions. A negative result may occur with  improper specimen  collection/handling, submission of specimen other than nasopharyngeal swab, presence of viral mutation(s) within the areas targeted by this assay, and inadequate number of viral copies(<138 copies/mL). A negative result must be combined with clinical observations, [name] history, and epidemiological information. The expected result is Negative.  Fact Sheet for Patients:  EntrepreneurPulse.com.au  Fact Sheet for Healthcare Providers:  IncredibleEmployment.be  This test is no t yet approved or cleared by the [place] FDA and  has been authorized for detection and/or diagnosis of SARS-CoV-2 by FDA under an Emergency Use Authorization (EUA). This EUA will remain  in effect (meaning this test can be used) for the duration of the COVID-19 declaration under Section 564(b)(1) of the Act, 21 U.S.C.section 360bbb-3(b)(1), unless the authorization is terminated  or revoked sooner.       Influenza A by PCR NEGATIVE NEGATIVE Final   Influenza B by PCR NEGATIVE NEGATIVE Final    Comment: (NOTE) The Xpert Xpress SARS-CoV-2/FLU/RSV plus assay is intended as an aid in the diagnosis of influenza from Nasopharyngeal swab specimens and should not be used as a sole basis for treatment. Nasal washings and aspirates are unacceptable for Xpert Xpress SARS-CoV-2/FLU/RSV testing.  Fact Sheet for Patients: EntrepreneurPulse.com.au  Fact Sheet for Healthcare Providers: IncredibleEmployment.be  This test is not yet approved or cleared by the [place] FDA and has been authorized for detection and/or diagnosis of SARS-CoV-2 by FDA under an Emergency Use Authorization (EUA). This EUA will remain in effect (meaning this test can be used) for the duration of the COVID-19 declaration under Section 564(b)(1) of the Act, 21 U.S.C. section 360bbb-3(b)(1), unless the authorization is terminated or revoked.  Performed at [facility], [address] [place], [place] [address]   Blood culture (routine x 2)     Status: None   Collection Time: 10/29/20  2:35 PM   Specimen: BLOOD  Result Value Ref Range Status   Specimen Description   Final    BLOOD LEFT ANTECUBITAL Performed at [facility], [address], [place], [place] [address]    Special Requests   Final    BOTTLES DRAWN AEROBIC AND ANAEROBIC Blood Culture results may not be optimal due to an inadequate volume of blood received in culture bottles Performed at [facility], [address] [place], [place] [address]  Culture   Final    NO GROWTH 5 DAYS Performed at [facility], [place] [address], [place], [place] [address]    Report Status 11/03/2020 FINAL  Final  Aerobic/Anaerobic Culture w Gram Stain (surgical/deep wound)     Status: None (Preliminary result)   Collection Time: 11/01/20  2:14 PM   Specimen: PATH Soft tissue  Result Value Ref Range Status   Specimen Description   Final    TISSUE RIGHT FOOT Performed at [facility] [address] [place], [place] [address]    Special Requests   Final    NONE Performed at [facility], [place] [address], [place], [place] [address]    Gram Stain   Final    ABUNDANT WBC PRESENT, PREDOMINANTLY PMN RARE GRAM POSITIVE COCCI IN PAIRS FEW GRAM NEGATIVE RODS Performed at [facility], [place] [address] [place], [place] [address]    Culture   Final    ABUNDANT STENOTROPHOMONAS MALTOPHILIA FEW METHICILLIN RESISTANT STAPHYLOCOCCUS AUREUS NO ANAEROBES ISOLATED; CULTURE IN [name] FOR 5 DAYS    Report Status PENDING  Incomplete   Organism ID, Bacteria STENOTROPHOMONAS MALTOPHILIA  Final   Organism ID, Bacteria METHICILLIN RESISTANT STAPHYLOCOCCUS AUREUS  Final      Susceptibility   Methicillin resistant staphylococcus aureus - MIC*    CIPROFLOXACIN >=8 RESISTANT Resistant     ERYTHROMYCIN >=8  RESISTANT Resistant     GENTAMICIN <=0.5 SENSITIVE Sensitive     OXACILLIN >=4 RESISTANT Resistant     TETRACYCLINE <=1 SENSITIVE Sensitive     VANCOMYCIN <=0.5 SENSITIVE Sensitive     TRIMETH/SULFA >=320 RESISTANT Resistant     CLINDAMYCIN <=0.25 SENSITIVE Sensitive     RIFAMPIN <=0.5 SENSITIVE Sensitive     Inducible Clindamycin NEGATIVE Sensitive     * FEW METHICILLIN RESISTANT STAPHYLOCOCCUS AUREUS   Stenotrophomonas maltophilia - MIC*    LEVOFLOXACIN 0.5 SENSITIVE Sensitive     TRIMETH/SULFA <=20 SENSITIVE Sensitive     * ABUNDANT STENOTROPHOMONAS MALTOPHILIA  Acid Fast Smear (AFB)     Status: None   Collection Time: 11/01/20  2:14 PM   Specimen: PATH Soft tissue  Result Value Ref Range Status   AFB Specimen Processing Comment  Final    Comment: Tissue Grinding and Digestion/Decontamination   Acid Fast Smear Negative  Final    Comment: (NOTE) Performed At: [facility] [place], [place] [id number] [address] [name] MO:[id number]    Source (AFB) TISSUE  Final    Comment: RIGHT FOOT Performed at [facility], [place] [address] [place], [place] [address]           Radiology Studies: No results found.      Scheduled Meds:  vitamin C  250 mg Oral BID   cefdinir  300 mg Oral Q12H   feeding supplement  237 mL Oral BID BM   ferrous gluconate  324 mg Oral BID WC   linezolid  600 mg Oral Q12H   metoprolol tartrate  12.5 mg Oral BID   multivitamin with minerals  1 tablet Oral Daily   pantoprazole  40 mg Oral BID   PARoxetine  25 mg Oral q morning   Continuous Infusions:     LOS: 8 days       Hosie Poisson, [name] [facility]   To contact the attending provider between 7A-7P or the covering provider during after hours 7P-7A, please log into the web site [email or web address] and access using  universal [place] password for that web site. If you do not have the password, please call the hospital operator.  11/06/2020,  3:30 PM

## 2020-11-07 DIAGNOSIS — S91301A Unspecified open wound, right foot, initial encounter: Secondary | ICD-10-CM | POA: Diagnosis not present

## 2020-11-07 DIAGNOSIS — I10 Essential (primary) hypertension: Secondary | ICD-10-CM | POA: Diagnosis not present

## 2020-11-07 LAB — AEROBIC/ANAEROBIC CULTURE W GRAM STAIN (SURGICAL/DEEP WOUND)

## 2020-11-07 LAB — SURGICAL PCR SCREEN
MRSA, PCR: NEGATIVE
Staphylococcus aureus: NEGATIVE

## 2020-11-07 MED ORDER — CHLORHEXIDINE GLUCONATE CLOTH 2 % EX PADS
6.0000 | MEDICATED_PAD | Freq: Every day | CUTANEOUS | Status: DC
Start: 2020-11-08 — End: 2020-11-10
  Administered 2020-11-07 – 2020-11-10 (×4): 6 via TOPICAL

## 2020-11-07 MED ORDER — MUPIROCIN 2 % EX OINT
1.0000 "application " | TOPICAL_OINTMENT | Freq: Two times a day (BID) | CUTANEOUS | Status: DC
  Administered 2020-11-07 – 2020-11-10 (×6): 1 via NASAL
  Filled 2020-11-07 (×2): qty 22

## 2020-11-07 NOTE — Progress Notes (Signed)
PROGRESS NOTE    [name] Price  IZT:[id number] DOB: Jun 22, 1940 DOA: 10/29/2020 PCP: Tonia Ghent, MD    No chief complaint on file.   Brief Narrative:   80 year old lady with prior history of hypertension, depression presents with right ankle cellulitis. She was recently hospitalized from 8/27 till 9/7 for right ankle cellulitis, patient received IV antibiotics.  Orthopedic surgery was consulted and she underwent blister aspiration and eventually discharged home on 10/19/20.  Despite being on antibiotics patient continues to have right foot/ankle redness worsening for purulent discharge and ulceration.  She was admitted on 10/29/2020 and underwent an MRI which was negative for any deep abscesses. PT SEEN and examined, no new complaints.    Assessment & Plan:   Active Problems:   Sepsis ([place])   Wound of right foot   Anemia   HTN (hypertension)   Right foot cellulitis/wound Sepsis ruled out MRI of the right ankle shows Very large necrotic ulceration involving the lower calf anterolaterally, the lateral ankle, and extending into the lateral dorsal forefoot at least as far as the MTP joints. No osteomyelitis or deep abscesses found. Small effusion of the posterior subtalar joint, without marrow edema along the bony surfaces to further indicate septic arthritis ID consulted, suggested differential could be an autoimmune phenomenon such as pyoderma gangrenosum versus Non- tuberculosis mycobacterial infection.  ID recommends wound edge biopsy and specimen to be sent out to test to [facility] for fungal and afb pcr, for our pathology to test for afb and fungal immunohistochemistry stains Auto immune work up so far is negative. CK wnl,  CRP is 4.2. sed rate is 48.  TSH wnl.  HIV screen is negative.  ABI reviewed.  Plastic surgery consulted and she underwent debridement with Integra on 11/01/2020. Plan for additional debridement, application of wound matrix and application of wound VAC on  11/08/2020 as per plastic surgery. NPO after midnight.  She was started on Primaxin on 11/02/2008 and Zyvox and discontinued on 11/03/20 Wound cultures growing  abundant Stenotrophomonas Maltophilia and MRSA, resistant to bactrim.she was transitioned to linezolid and cefnidir on 11/05/20 to complete a 7 day course.  Surgical pathology shows  Inflamed and necrotic skin and subcutaneous tissue. No malignancy. Pain control with fentanyl and typlenol.  She remains afebrile and wbc counts are wnl.   Essential hypertension Blood pressure parameters are optimal.  Continue with metoprolol 12.5 mg BID.    History of depression Patient on Paxil,  continue the same   Acute anemia of blood loss superimposed on anemia of chronic disease/iron deficiency anemia Iron supplementation on board and 1 unit of  PRBC transfusion ordered. Hemoglobin improved to 9.6 today.  Repeat labs for tomorrow will be ordered.    Gerd:  Continue with protonix.    DVT prophylaxis: scd's Code Status: Full code  family Communication: None at bedside Disposition:   Status is: Inpatient  Remains inpatient appropriate because:Ongoing diagnostic testing needed not appropriate for outpatient work up, Unsafe d/c plan, and IV treatments appropriate due to intensity of illness or inability to take PO  Dispo: The patient is from: Home              Anticipated d/c is to:  Pending              Patient currently is not medically stable to d/c.   Difficult to place patient No       Consultants:  Plastic surgery Dr. Claudia Desanctis Orthopedics Dr. Hiram Comber Infectious disease Dr. Drucilla Schmidt  Procedures: Debridement by plastic surgery on 11/02/2019  Antimicrobials:  Antibiotics Given (last 72 hours)     Date/Time Action Medication Dose   11/04/20 2146 Given   sulfamethoxazole-trimethoprim (BACTRIM DS) 800-160 MG per tablet 1 tablet 1 tablet   11/05/20 0945 Given   sulfamethoxazole-trimethoprim (BACTRIM DS) 800-160 MG per tablet 1  tablet 1 tablet   11/05/20 1449 Given   linezolid (ZYVOX) tablet 600 mg 600 mg   11/05/20 1449 Given   cefdinir (OMNICEF) capsule 300 mg 300 mg   11/05/20 2140 Given   cefdinir (OMNICEF) capsule 300 mg 300 mg   11/05/20 2141 Given   linezolid (ZYVOX) tablet 600 mg 600 mg   11/06/20 0813 Given   cefdinir (OMNICEF) capsule 300 mg 300 mg   11/06/20 1245 Given   linezolid (ZYVOX) tablet 600 mg 600 mg   11/06/20 2102 Given   linezolid (ZYVOX) tablet 600 mg 600 mg   11/06/20 2103 Given   cefdinir (OMNICEF) capsule 300 mg 300 mg   11/07/20 0955 Given   linezolid (ZYVOX) tablet 600 mg 600 mg   11/07/20 0956 Given   cefdinir (OMNICEF) capsule 300 mg 300 mg         Subjective: No new complaints.    Objective: Vitals:   11/06/20 1542 11/06/20 2040 11/07/20 0425 11/07/20 1340  BP: (!) 103/53 (!) 108/58 (!) 100/56 106/60  Pulse: 73 82 74 78  Resp: 16 16 14 15   Temp: 98.7 F (37.1 C) 99.2 F (37.3 C) 98.7 F (37.1 C) 98.5 F (36.9 C)  TempSrc: Oral Oral Oral Oral  SpO2: 93% 98% 94% 95%  Weight:      Height:        Intake/Output Summary (Last 24 hours) at 11/07/2020 1501 Last data filed at 11/07/2020 0314 Gross per 24 hour  Intake 360 ml  Output --  Net 360 ml    Filed Weights   10/29/20 1318 11/01/20 1216  Weight: 64.9 kg 64.9 kg    Examination:  General exam: Appears calm and comfortable  Respiratory system: Clear to auscultation. Respiratory effort normal. Cardiovascular system: S1 & S2 heard, RRR. No JVD, Gastrointestinal system: Abdomen is nondistended, soft and nontender. Normal bowel sounds heard. Central nervous system: Alert and oriented. No focal neurological deficits. Extremities: right foot ulcer.  Skin: see below Psychiatry:  Mood & affect appropriate.           Data Reviewed: I have personally reviewed following labs and imaging studies  CBC: Recent Labs  Lab 11/02/20 0501 11/03/20 0942 11/06/20 1551  WBC 6.8 7.5 6.6  NEUTROABS  --   4.9 4.3  HGB 6.8* 8.2* 9.6*  HCT 21.7* 25.9* 30.5*  MCV 96.9 95.6 95.3  PLT 363 397 452*     Basic Metabolic Panel: Recent Labs  Lab 11/02/20 0501 11/03/20 0942 11/06/20 1551  NA 135 142 135  K 4.8 4.3 4.6  CL 104 107 101  CO2 26 26 26   GLUCOSE 134* 92 105*  BUN 17 20 21   CREATININE 0.55 0.64 0.83  CALCIUM 8.1* 8.6* 8.8*     GFR: Estimated Creatinine Clearance: 51.4 mL/min (by C-G formula based on SCr of 0.83 mg/dL).  Liver Function Tests: No results for input(s): AST, ALT, ALKPHOS, BILITOT, PROT, ALBUMIN in the last 168 hours.   CBG: No results for input(s): GLUCAP in the last 168 hours.   Recent Results (from the past 240 hour(s))  Blood culture (routine x 2)     Status: None  Collection Time: 10/29/20  2:30 PM   Specimen: BLOOD  Result Value Ref Range Status   Specimen Description   Final    BLOOD RIGHT ANTECUBITAL Performed at [facility], [address] [place], [place] [address]    Special Requests   Final    BOTTLES DRAWN AEROBIC AND ANAEROBIC Blood Culture results may not be optimal due to an inadequate volume of blood received in culture bottles Performed at [facility], [address], [place], [place] [address]    Culture   Final    NO GROWTH 5 DAYS Performed at [facility], [place] [address], [place], [place] [address]    Report Status 11/03/2020 FINAL  Final  Resp Panel by RT-PCR (Flu A&B, Covid) Nasopharyngeal Swab     Status: None   Collection Time: 10/29/20  2:32 PM   Specimen: Nasopharyngeal Swab; Nasopharyngeal(NP) swabs in vial transport medium  Result Value Ref Range Status   SARS Coronavirus 2 by RT PCR NEGATIVE NEGATIVE Final    Comment: (NOTE) SARS-CoV-2 target nucleic acids are NOT DETECTED.  The SARS-CoV-2 RNA is generally detectable in upper respiratory specimens during the acute phase of infection. The lowest concentration of SARS-CoV-2 viral copies this assay can detect is 138  copies/mL. A negative result does not preclude SARS-Cov-2 infection and should not be used as the sole basis for treatment or other patient management decisions. A negative result may occur with  improper specimen collection/handling, submission of specimen other than nasopharyngeal swab, presence of viral mutation(s) within the areas targeted by this assay, and inadequate number of viral copies(<138 copies/mL). A negative result must be combined with clinical observations, patient history, and epidemiological information. The expected result is Negative.  Fact Sheet for Patients:  EntrepreneurPulse.com.au  Fact Sheet for Healthcare Providers:  IncredibleEmployment.be  This test is no t yet approved or cleared by the [place] FDA and  has been authorized for detection and/or diagnosis of SARS-CoV-2 by FDA under an Emergency Use Authorization (EUA). This EUA will remain  in effect (meaning this test can be used) for the duration of the COVID-19 declaration under Section 564(b)(1) of the Act, 21 U.S.C.section 360bbb-3(b)(1), unless the authorization is terminated  or revoked sooner.       Influenza A by PCR NEGATIVE NEGATIVE Final   Influenza B by PCR NEGATIVE NEGATIVE Final    Comment: (NOTE) The Xpert Xpress SARS-CoV-2/FLU/RSV plus assay is intended as an aid in the diagnosis of influenza from Nasopharyngeal swab specimens and should not be used as a sole basis for treatment. Nasal washings and aspirates are unacceptable for Xpert Xpress SARS-CoV-2/FLU/RSV testing.  Fact Sheet for Patients: EntrepreneurPulse.com.au  Fact Sheet for Healthcare Providers: IncredibleEmployment.be  This test is not yet approved or cleared by the [place] FDA and has been authorized for detection and/or diagnosis of SARS-CoV-2 by FDA under an Emergency Use Authorization (EUA). This EUA will remain in effect (meaning  this test can be used) for the duration of the COVID-19 declaration under Section 564(b)(1) of the Act, 21 U.S.C. section 360bbb-3(b)(1), unless the authorization is terminated or revoked.  Performed at [facility], [address], [place], [place] [address]   Blood culture (routine x 2)     Status: None   Collection Time: 10/29/20  2:35 PM   Specimen: BLOOD  Result Value Ref Range Status   Specimen Description   Final    BLOOD LEFT ANTECUBITAL Performed at [facility], [place]  [address], [place], [place] [address]    Special Requests   Final    BOTTLES DRAWN AEROBIC AND ANAEROBIC Blood Culture results may not be optimal due to an inadequate volume of blood received in culture bottles Performed at [facility], [address] [place], [place] [address]    Culture   Final    NO GROWTH 5 DAYS Performed at [facility], [place] [address] [place], [place] [address]    Report Status 11/03/2020 FINAL  Final  Aerobic/Anaerobic Culture w Gram Stain (surgical/deep wound)     Status: None   Collection Time: 11/01/20  2:14 PM   Specimen: PATH Soft tissue  Result Value Ref Range Status   Specimen Description   Final    TISSUE RIGHT FOOT Performed at [facility] [address] [place], [place] [address]    Special Requests   Final    NONE Performed at [facility], [place] [address], [place], [place] [address]    Gram Stain   Final    ABUNDANT WBC PRESENT, PREDOMINANTLY PMN RARE GRAM POSITIVE COCCI IN PAIRS FEW GRAM NEGATIVE RODS    Culture   Final    ABUNDANT STENOTROPHOMONAS MALTOPHILIA FEW METHICILLIN RESISTANT STAPHYLOCOCCUS AUREUS NO ANAEROBES ISOLATED Performed at [facility], [place] [address] [place], [place] [address]    Report Status 11/07/2020 FINAL  Final   Organism ID, Bacteria STENOTROPHOMONAS MALTOPHILIA  Final   Organism ID, Bacteria METHICILLIN RESISTANT  STAPHYLOCOCCUS AUREUS  Final      Susceptibility   Methicillin resistant staphylococcus aureus - MIC*    CIPROFLOXACIN >=8 RESISTANT Resistant     ERYTHROMYCIN >=8 RESISTANT Resistant     GENTAMICIN <=0.5 SENSITIVE Sensitive     OXACILLIN >=4 RESISTANT Resistant     TETRACYCLINE <=1 SENSITIVE Sensitive     VANCOMYCIN <=0.5 SENSITIVE Sensitive     TRIMETH/SULFA >=320 RESISTANT Resistant     CLINDAMYCIN <=0.25 SENSITIVE Sensitive     RIFAMPIN <=0.5 SENSITIVE Sensitive     Inducible Clindamycin NEGATIVE Sensitive     * FEW METHICILLIN RESISTANT STAPHYLOCOCCUS AUREUS   Stenotrophomonas maltophilia - MIC*    LEVOFLOXACIN 0.5 SENSITIVE Sensitive     TRIMETH/SULFA <=20 SENSITIVE Sensitive     * ABUNDANT STENOTROPHOMONAS MALTOPHILIA  Acid Fast Smear (AFB)     Status: None   Collection Time: 11/01/20  2:14 PM   Specimen: PATH Soft tissue  Result Value Ref Range Status   AFB Specimen Processing Comment  Final    Comment: Tissue Grinding and Digestion/Decontamination   Acid Fast Smear Negative  Final    Comment: (NOTE) Performed At: [facility] [employer] [place], [place] [id number] Rush Farmer MD YW:[id number]    Source (AFB) TISSUE  Final    Comment: RIGHT FOOT Performed at [facility], [place] [address] [place],  [address]           Radiology Studies: No results found.      Scheduled Meds:  vitamin C  250 mg Oral BID   cefdinir  300 mg Oral Q12H   feeding supplement  237 mL Oral BID BM   ferrous gluconate  324 mg Oral BID WC   linezolid  600 mg Oral Q12H   metoprolol tartrate  12.5 mg Oral BID   multivitamin with minerals  1 tablet Oral Daily   pantoprazole  40 mg Oral BID   PARoxetine  25 mg Oral q morning   Continuous  Infusions:     LOS: 9 days       Hosie Poisson, MD [facility]   To contact the attending provider between 7A-7P or the covering provider during after hours 7P-7A, please log into the web  site [email or web address] and access using universal [place] password for that web site. If you do not have the password, please call the hospital operator.  11/07/2020, 3:01 PM

## 2020-11-07 NOTE — Progress Notes (Signed)
Subjective:  Chart reviewed History reviewed  Itchy blister red rash on foot even before pedicure, then pedicure starting july 2022. Pcp given [place] cream 2 weeks with ?some improvement but ultimately progressed itch/blister. No f/c. Given keflex course, no improvement and starting to ulcerate/become necrotic, admitted 8/27-9/07 given brief steroids/vanc/cefepime --> augmentin/doxy. Readmitted 9/17 for same; s/p biopsy  9/20 4th admission the past 30 days for ulcerating minimally tender right foot without sepsis, with planned skin graft   Pathology 9/16  Inflamed necrotic skin and sq tissue; no malignancy   She felt fine, no fever chill  No hx gi inflammatory bowel disease No hx rheumatologic disease No family hx sarcoidosis Hx breast cancer distant past but not active.   Antibiotics:  Anti-infectives (From admission, onward)    Start     Dose/Rate Route Frequency Ordered Stop   11/05/20 1445  linezolid (ZYVOX) tablet 600 mg        600 mg Oral Every 12 hours 11/05/20 1354     11/05/20 1445  cefdinir (OMNICEF) capsule 300 mg        300 mg Oral Every 12 hours 11/05/20 1354     11/04/20 2200  sulfamethoxazole-trimethoprim (BACTRIM DS) 800-160 MG per tablet 1 tablet  Status:  Discontinued        1 tablet Oral Every 12 hours 11/04/20 1408 11/05/20 1353   11/02/20 2200  sulfamethoxazole-trimethoprim (BACTRIM DS) 800-160 MG per tablet 2 tablet  Status:  Discontinued        2 tablet Oral Every 12 hours 11/02/20 2010 11/04/20 1408   11/02/20 0600  ceFAZolin (ANCEF) IVPB 2g/100 mL premix        2 g 200 mL/hr over 30 Minutes Intravenous On call to O.R. 11/01/20 1205 11/01/20 1410   11/01/20 2245  imipenem-cilastatin (PRIMAXIN) 500 mg in sodium chloride 0.9 % 100 mL IVPB  Status:  Discontinued        500 mg 200 mL/hr over 30 Minutes Intravenous Every 8 hours 11/01/20 2156 11/02/20 2010   11/01/20 2230  linezolid (ZYVOX) tablet 600 mg  Status:  Discontinued        600 mg  Oral Every 12 hours 11/01/20 2143 11/02/20 2010   10/31/20 0500  vancomycin (VANCOCIN) IVPB 1000 mg/200 mL premix  Status:  Discontinued        1,000 mg 200 mL/hr over 60 Minutes Intravenous Every 24 hours 10/30/20 1057 10/31/20 1108   10/30/20 0500  vancomycin (VANCOREADY) IVPB 1250 mg/250 mL  Status:  Discontinued        1,250 mg 166.7 mL/hr over 90 Minutes Intravenous Every 24 hours 10/29/20 2344 10/30/20 1057   10/30/20 0400  ceFEPIme (MAXIPIME) 2 g in sodium chloride 0.9 % 100 mL IVPB  Status:  Discontinued        2 g 200 mL/hr over 30 Minutes Intravenous Every 12 hours 10/29/20 2344 10/31/20 1108   10/29/20 1630  vancomycin (VANCOCIN) IVPB 1000 mg/200 mL premix        1,000 mg 200 mL/hr over 60 Minutes Intravenous  Once 10/29/20 1614 10/29/20 1911   10/29/20 1615  ceFEPIme (MAXIPIME) 2 g in sodium chloride 0.9 % 100 mL IVPB        2 g 200 mL/hr over 30 Minutes Intravenous  Once 10/29/20 1614 10/29/20 1911       Medications: Scheduled Meds:  vitamin C  250 mg Oral BID   cefdinir  300 mg Oral Q12H   feeding  supplement  237 mL Oral BID BM   ferrous gluconate  324 mg Oral BID WC   linezolid  600 mg Oral Q12H   metoprolol tartrate  12.5 mg Oral BID   multivitamin with minerals  1 tablet Oral Daily   pantoprazole  40 mg Oral BID   PARoxetine  25 mg Oral q morning   Continuous Infusions:   PRN Meds:.acetaminophen **OR** acetaminophen, fentaNYL (SUBLIMAZE) injection    Objective: Weight change:   Intake/Output Summary (Last 24 hours) at 11/07/2020 1224 Last data filed at 11/07/2020 0314 Gross per 24 hour  Intake 600 ml  Output --  Net 600 ml    Blood pressure (!) 100/56, pulse 74, temperature 98.7 F (37.1 C), temperature source Oral, resp. rate 14, height 5\' 3"  (1.6 m), weight 64.9 kg, SpO2 94 %. Temp:  [98.7 F (37.1 C)-99.2 F (37.3 C)] 98.7 F (37.1 C) (09/26 0425) Pulse Rate:  [73-82] 74 (09/26 0425) Resp:  [14-16] 14 (09/26 0425) BP: (100-108)/(53-58)  100/56 (09/26 0425) SpO2:  [93 %-98 %] 94 % (09/26 0425)  Physical Exam: General/constitutional: no distress, pleasant HEENT: Normocephalic, PER, Conj Clear, EOMI, Oropharynx clear Neck supple CV: rrr no mrg Lungs: clear to auscultation, normal respiratory effort Abd: Soft, Nontender Ext: no edema Skin: right foot in bulky dressing Neuro: nonfocal MSK: no peripheral joint swelling/tenderness/warmth; back spines nontender     I reviewed the pictures from previous admission and current picture (patient's phone). It looks stable in terms of well demarcated clean based wound over the right foot/ankle)   LABs: Lab Results  Component Value Date   WBC 6.6 11/06/2020   HGB 9.6 (L) 11/06/2020   HCT 30.5 (L) 11/06/2020   MCV 95.3 11/06/2020   PLT 452 (H) 31/54/0086   Last metabolic panel Lab Results  Component Value Date   GLUCOSE 105 (H) 11/06/2020   NA 135 11/06/2020   K 4.6 11/06/2020   CL 101 11/06/2020   CO2 26 11/06/2020   BUN 21 11/06/2020   CREATININE 0.83 11/06/2020   GFRNONAA >60 11/06/2020   CALCIUM 8.8 (L) 11/06/2020   PHOS 3.2 10/30/2020   PROT 5.2 (L) 10/30/2020   ALBUMIN 2.2 (L) 10/30/2020   BILITOT 0.6 10/30/2020   ALKPHOS 45 10/30/2020   AST 12 (L) 10/30/2020   ALT 9 10/30/2020   ANIONGAP 8 11/06/2020     Micro Results: Recent Results (from the past 720 hour(s))  Resp Panel by RT-PCR (Flu A&B, Covid) Nasopharyngeal Swab     Status: None   Collection Time: 10/08/20  1:57 PM   Specimen: Nasopharyngeal Swab; Nasopharyngeal(NP) swabs in vial transport medium  Result Value Ref Range Status   SARS Coronavirus 2 by RT PCR NEGATIVE NEGATIVE Final    Comment: (NOTE) SARS-CoV-2 target nucleic acids are NOT DETECTED.  The SARS-CoV-2 RNA is generally detectable in upper respiratory specimens during the acute phase of infection. The lowest concentration of SARS-CoV-2 viral copies this assay can detect is 138 copies/mL. A negative result does not preclude  SARS-Cov-2 infection and should not be used as the sole basis for treatment or other patient management decisions. A negative result may occur with  improper specimen collection/handling, submission of specimen other than nasopharyngeal swab, presence of viral mutation(s) within the areas targeted by this assay, and inadequate number of viral copies(<138 copies/mL). A negative result must be combined with clinical observations, patient history, and epidemiological information. The expected result is Negative.  Fact Sheet for Patients:  EntrepreneurPulse.com.au  Fact  Sheet for Healthcare Providers:  IncredibleEmployment.be  This test is no t yet approved or cleared by the [place] FDA and  has been authorized for detection and/or diagnosis of SARS-CoV-2 by FDA under an Emergency Use Authorization (EUA). This EUA will remain  in effect (meaning this test can be used) for the duration of the COVID-19 declaration under Section 564(b)(1) of the Act, 21 U.S.C.section 360bbb-3(b)(1), unless the authorization is terminated  or revoked sooner.       Influenza A by PCR NEGATIVE NEGATIVE Final   Influenza B by PCR NEGATIVE NEGATIVE Final    Comment: (NOTE) The Xpert Xpress SARS-CoV-2/FLU/RSV plus assay is intended as an aid in the diagnosis of influenza from Nasopharyngeal swab specimens and should not be used as a sole basis for treatment. Nasal washings and aspirates are unacceptable for Xpert Xpress SARS-CoV-2/FLU/RSV testing.  Fact Sheet for Patients: EntrepreneurPulse.com.au  Fact Sheet for Healthcare Providers: IncredibleEmployment.be  This test is not yet approved or cleared by the [place] FDA and has been authorized for detection and/or diagnosis of SARS-CoV-2 by FDA under an Emergency Use Authorization (EUA). This EUA will remain in effect (meaning this test can be used) for the duration of  the COVID-19 declaration under Section 564(b)(1) of the Act, 21 U.S.C. section 360bbb-3(b)(1), unless the authorization is terminated or revoked.  Performed at [facility], [place] [address], [place], [place] [address]   Blood Culture (routine x 2)     Status: None   Collection Time: 10/08/20  2:05 PM   Specimen: BLOOD  Result Value Ref Range Status   Specimen Description BLOOD LEFT ANTECUBITAL  Final   Special Requests   Final    BOTTLES DRAWN AEROBIC AND ANAEROBIC Blood Culture adequate volume   Culture   Final    NO GROWTH 5 DAYS Performed at [facility], [place] [address], [place], [place] [address]    Report Status 10/13/2020 FINAL  Final  Blood Culture (routine x 2)     Status: None   Collection Time: 10/08/20  2:14 PM   Specimen: BLOOD  Result Value Ref Range Status   Specimen Description BLOOD SITE NOT SPECIFIED  Final   Special Requests   Final    BOTTLES DRAWN AEROBIC AND ANAEROBIC Blood Culture results may not be optimal due to an inadequate volume of blood received in culture bottles   Culture   Final    NO GROWTH 5 DAYS Performed at [facility], [place] [address] [place], [place] [address]    Report Status 10/13/2020 FINAL  Final  C Difficile Quick Screen w PCR reflex     Status: None   Collection Time: 10/08/20  4:24 PM   Specimen: STOOL  Result Value Ref Range Status   C Diff antigen NEGATIVE NEGATIVE Final   C Diff toxin NEGATIVE NEGATIVE Final   C Diff interpretation No C. difficile detected.  Final    Comment: Performed at [facility], [place] [address], [place], [place] [address]  Blood culture (routine x 2)     Status: None   Collection Time: 10/29/20  2:30 PM   Specimen: BLOOD  Result Value Ref Range Status   Specimen Description   Final    BLOOD RIGHT ANTECUBITAL Performed at [facility], [address] [place], [place] [address]    Special Requests   Final    BOTTLES DRAWN AEROBIC AND ANAEROBIC Blood  Culture results may not be optimal  due to an inadequate volume of blood received in culture bottles Performed at [facility], [address] [place], [place] [address]    Culture   Final    NO GROWTH 5 DAYS Performed at [facility], [place] [address] [place], [place] [address]    Report Status 11/03/2020 FINAL  Final  Resp Panel by RT-PCR (Flu A&B, Covid) Nasopharyngeal Swab     Status: None   Collection Time: 10/29/20  2:32 PM   Specimen: Nasopharyngeal Swab; Nasopharyngeal(NP) swabs in vial transport medium  Result Value Ref Range Status   SARS Coronavirus 2 by RT PCR NEGATIVE NEGATIVE Final    Comment: (NOTE) SARS-CoV-2 target nucleic acids are NOT DETECTED.  The SARS-CoV-2 RNA is generally detectable in upper respiratory specimens during the acute phase of infection. The lowest concentration of SARS-CoV-2 viral copies this assay can detect is 138 copies/mL. A negative result does not preclude SARS-Cov-2 infection and should not be used as the sole basis for treatment or other patient management decisions. A negative result may occur with  improper specimen collection/handling, submission of specimen other than nasopharyngeal swab, presence of viral mutation(s) within the areas targeted by this assay, and inadequate number of viral copies(<138 copies/mL). A negative result must be combined with clinical observations, patient history, and epidemiological information. The expected result is Negative.  Fact Sheet for Patients:  EntrepreneurPulse.com.au  Fact Sheet for Healthcare Providers:  IncredibleEmployment.be  This test is no t yet approved or cleared by the [place] FDA and  has been authorized for detection and/or diagnosis of SARS-CoV-2 by FDA under an Emergency Use Authorization (EUA). This EUA will remain  in effect (meaning this test can be used) for the duration of the COVID-19 declaration under  Section 564(b)(1) of the Act, 21 U.S.C.section 360bbb-3(b)(1), unless the authorization is terminated  or revoked sooner.       Influenza A by PCR NEGATIVE NEGATIVE Final   Influenza B by PCR NEGATIVE NEGATIVE Final    Comment: (NOTE) The Xpert Xpress SARS-CoV-2/FLU/RSV plus assay is intended as an aid in the diagnosis of influenza from Nasopharyngeal swab specimens and should not be used as a sole basis for treatment. Nasal washings and aspirates are unacceptable for Xpert Xpress SARS-CoV-2/FLU/RSV testing.  Fact Sheet for Patients: EntrepreneurPulse.com.au  Fact Sheet for Healthcare Providers: IncredibleEmployment.be  This test is not yet approved or cleared by the [place] FDA and has been authorized for detection and/or diagnosis of SARS-CoV-2 by FDA under an Emergency Use Authorization (EUA). This EUA will remain in effect (meaning this test can be used) for the duration of the COVID-19 declaration under Section 564(b)(1) of the Act, 21 U.S.C. section 360bbb-3(b)(1), unless the authorization is terminated or revoked.  Performed at [facility], [address], [place], [place] [address]   Blood culture (routine x 2)     Status: None   Collection Time: 10/29/20  2:35 PM   Specimen: BLOOD  Result Value Ref Range Status   Specimen Description   Final    BLOOD LEFT ANTECUBITAL Performed at [facility], [address], [place], [place] [address]    Special Requests   Final    BOTTLES DRAWN AEROBIC AND ANAEROBIC Blood Culture results may not be optimal due to an inadequate volume of blood received in culture bottles Performed at [facility], [address], [place], [place] [address]    Culture   Final    NO GROWTH 5 DAYS Performed  at [facility], [place] [address] [place], [place] [address]    Report Status 11/03/2020 FINAL  Final  Aerobic/Anaerobic Culture w  Gram Stain (surgical/deep wound)     Status: None (Preliminary result)   Collection Time: 11/01/20  2:14 PM   Specimen: PATH Soft tissue  Result Value Ref Range Status   Specimen Description   Final    TISSUE RIGHT FOOT Performed at [facility] [address] [place], [place] [address]    Special Requests   Final    NONE Performed at [facility], [place] [address], [place], [place] [address]    Gram Stain   Final    ABUNDANT WBC PRESENT, PREDOMINANTLY PMN RARE GRAM POSITIVE COCCI IN PAIRS FEW GRAM NEGATIVE RODS Performed at [facility], [place] [address], [place], [place] [address]    Culture   Final    ABUNDANT STENOTROPHOMONAS MALTOPHILIA FEW METHICILLIN RESISTANT STAPHYLOCOCCUS AUREUS NO ANAEROBES ISOLATED; CULTURE IN PROGRESS FOR 5 DAYS    Report Status PENDING  Incomplete   Organism ID, Bacteria STENOTROPHOMONAS MALTOPHILIA  Final   Organism ID, Bacteria METHICILLIN RESISTANT STAPHYLOCOCCUS AUREUS  Final      Susceptibility   Methicillin resistant staphylococcus aureus - MIC*    CIPROFLOXACIN >=8 RESISTANT Resistant     ERYTHROMYCIN >=8 RESISTANT Resistant     GENTAMICIN <=0.5 SENSITIVE Sensitive     OXACILLIN >=4 RESISTANT Resistant     TETRACYCLINE <=1 SENSITIVE Sensitive     VANCOMYCIN <=0.5 SENSITIVE Sensitive     TRIMETH/SULFA >=320 RESISTANT Resistant     CLINDAMYCIN <=0.25 SENSITIVE Sensitive     RIFAMPIN <=0.5 SENSITIVE Sensitive     Inducible Clindamycin NEGATIVE Sensitive     * FEW METHICILLIN RESISTANT STAPHYLOCOCCUS AUREUS   Stenotrophomonas maltophilia - MIC*    LEVOFLOXACIN 0.5 SENSITIVE Sensitive     TRIMETH/SULFA <=20 SENSITIVE Sensitive     * ABUNDANT STENOTROPHOMONAS MALTOPHILIA  Acid Fast Smear (AFB)     Status: None   Collection Time: 11/01/20  2:14 PM   Specimen: PATH Soft tissue  Result Value Ref Range Status   AFB Specimen Processing Comment  Final    Comment: Tissue Grinding and  Digestion/Decontamination   Acid Fast Smear Negative  Final    Comment: (NOTE) Performed At: [facility] [place], [place] [id number] Rush Farmer MD MW:[id number]    Source (AFB) TISSUE  Final    Comment: RIGHT FOOT Performed at [facility], [place] [address] [place], [place] [address]     Studies/Results: No results found.    Assessment/Plan:  Active Problems:   Sepsis ([place])   Right foot infection   Anemia   HTN (hypertension)    [name] Price is a 80 y.o. female with  subacute onset of right foot ulcer that followed initial problems with allergies, scratching, then pedicure and a week later with pain, swelling and erythema. She was seen at [facility] and prescribed Keflex, but had worsening of necrotic like appearance to the wound and came back, treat again with cefazolin and had unroofing of her blister with bloody material that came out, briefly treated with steroids then with vancomycin and cefepime through number the 6, then discharged on Augmentin and doxycycline   No hx systemic sx, gi diarrheal disease, rheumatologic disease Distant hx breast cancer in remission No fam hx sarcoid  She has wound cx steno and mrsa on 9/20 but again the ulcer had  remain stable/clean based without evidence active typical bacterial infection. I continue her on gpc/gnr coverage plan for 7 days anticipating skin grafting  The main concern is this is concerning for an NTM process. I have sent pathology a communication to see if afb/fungal immunohistochemistry stain was done. But even if that is negative, I would pursue a repeat edge biopsy of the wound and send to [facility] for fungal/afb pcr  Other ddx is pathergy/pyoderma although she has no obvious underlying systemic disease for such    -continue planned 7 days cefdinir/linezolid starting 9/25 until 10/01 -I have sent secure chat message to primary team, dr Pace's team including his PA  asking for edge of wound biopsy and to send to [facility] for molecular study for AFB/fungal infection -I have ordered the sent out test for [facility] -I have also sent secure chat message/email message to Dr Martinique to do IHC stain for afb/fungal process  I spent more than 35 minute reviewing data/chart, and coordinating care and >50% direct face to face time providing counseling/discussing diagnostics/treatment plan with patient       LOS: 9 days   Ravon Mcilhenny T Virginio Isidore 11/07/2020, 12:24 PM

## 2020-11-07 NOTE — Care Management Important Message (Signed)
Important Message  Patient Details IM Letter given to the Patient. Name: [name] Price MRN: [id number] Date of Birth: [birth year]   Medicare Important Message Given:  Yes     Kerin Salen 11/07/2020, 12:49 PM

## 2020-11-07 NOTE — Progress Notes (Signed)
   11/07/20 1100  Mobility  Activity Ambulated in hall  Level of Assistance Independent after set-up  Assistive Device None  Distance Ambulated (ft) 600 ft  Mobility Ambulated independently in hallway;Ambulated with assistance in hallway  Mobility Response Tolerated well  Mobility performed by Mobility specialist  $Mobility charge 1 Mobility   Pt agreeable to mobilizing today. Ambulated in the hall about 638ft with no device, tolerated well. NO complaints. Let pt in bed with call bell at side. Notified RN of session.    [facility] Specialist Acute Rehab Services Office: [phone]

## 2020-11-07 NOTE — Progress Notes (Signed)
Patient reports that she would like dressing change completed after MD assesses wound in am. She is anticipating that MD will look at wound Monday am before performing procedure Tuesday. Will pass this on to day RN.Roderick Pee

## 2020-11-07 NOTE — Progress Notes (Signed)
6 Days Post-Op  Subjective: 80 year old female status postdebridement of right foot wound with Dr. Claudia Desanctis on 11/01/2020.  Patient is doing well today.  She has been up ambulating.  She is not having any issues.  She does not endorse much pain.  She is ready for scheduled procedure tomorrow, she has some questions about the planned procedure.  She is aware to remain n.p.o.  Objective: Vital signs in last 24 hours: Temp:  [98.7 F (37.1 C)-99.2 F (37.3 C)] 98.7 F (37.1 C) (09/26 0425) Pulse Rate:  [73-82] 74 (09/26 0425) Resp:  [14-16] 14 (09/26 0425) BP: (100-108)/(53-58) 100/56 (09/26 0425) SpO2:  [93 %-98 %] 94 % (09/26 0425) Last BM Date: 11/06/20  Intake/Output from previous day: 09/25 0701 - 09/26 0700 In: 840 [P.O.:840] Out: -  Intake/Output this shift: No intake/output data recorded.  General appearance: alert, cooperative, no distress, and resting in bed, RN at bedside Extremities: Right lower extremity wound with good base of granulation tissue noted, a few satellite areas of nonviable tissue noted.  No foul odor is noted.  No surrounding erythema.  No cellulitic changes.  No purulence noted.  No crepitus noted.  She is able to move all 5 toes, normal sensation and temperature distally.   Lab Results:  CBC Latest Ref Rng & Units 11/06/2020 11/03/2020 11/02/2020  WBC 4.0 - 10.5 K/uL 6.6 7.5 6.8  Hemoglobin 12.0 - 15.0 g/dL 9.6(L) 8.2(L) 6.8(LL)  Hematocrit 36.0 - 46.0 % 30.5(L) 25.9(L) 21.7(L)  Platelets 150 - 400 K/uL 452(H) 397 363    BMET Recent Labs    11/06/20 1551  NA 135  K 4.6  CL 101  CO2 26  GLUCOSE 105*  BUN 21  CREATININE 0.83  CALCIUM 8.8*   PT/INR No results for input(s): LABPROT, INR in the last 72 hours. ABG No results for input(s): PHART, HCO3 in the last 72 hours.  Invalid input(s): PCO2, PO2  Studies/Results: No results found.  Anti-infectives: Anti-infectives (From admission, onward)    Start     Dose/Rate Route Frequency Ordered Stop    11/05/20 1445  linezolid (ZYVOX) tablet 600 mg        600 mg Oral Every 12 hours 11/05/20 1354     11/05/20 1445  cefdinir (OMNICEF) capsule 300 mg        300 mg Oral Every 12 hours 11/05/20 1354     11/04/20 2200  sulfamethoxazole-trimethoprim (BACTRIM DS) 800-160 MG per tablet 1 tablet  Status:  Discontinued        1 tablet Oral Every 12 hours 11/04/20 1408 11/05/20 1353   11/02/20 2200  sulfamethoxazole-trimethoprim (BACTRIM DS) 800-160 MG per tablet 2 tablet  Status:  Discontinued        2 tablet Oral Every 12 hours 11/02/20 2010 11/04/20 1408   11/02/20 0600  ceFAZolin (ANCEF) IVPB 2g/100 mL premix        2 g 200 mL/hr over 30 Minutes Intravenous On call to O.R. 11/01/20 1205 11/01/20 1410   11/01/20 2245  imipenem-cilastatin (PRIMAXIN) 500 mg in sodium chloride 0.9 % 100 mL IVPB  Status:  Discontinued        500 mg 200 mL/hr over 30 Minutes Intravenous Every 8 hours 11/01/20 2156 11/02/20 2010   11/01/20 2230  linezolid (ZYVOX) tablet 600 mg  Status:  Discontinued        600 mg Oral Every 12 hours 11/01/20 2143 11/02/20 2010   10/31/20 0500  vancomycin (VANCOCIN) IVPB 1000 mg/200 mL premix  Status:  Discontinued        1,000 mg 200 mL/hr over 60 Minutes Intravenous Every 24 hours 10/30/20 1057 10/31/20 1108   10/30/20 0500  vancomycin (VANCOREADY) IVPB 1250 mg/250 mL  Status:  Discontinued        1,250 mg 166.7 mL/hr over 90 Minutes Intravenous Every 24 hours 10/29/20 2344 10/30/20 1057   10/30/20 0400  ceFEPIme (MAXIPIME) 2 g in sodium chloride 0.9 % 100 mL IVPB  Status:  Discontinued        2 g 200 mL/hr over 30 Minutes Intravenous Every 12 hours 10/29/20 2344 10/31/20 1108   10/29/20 1630  vancomycin (VANCOCIN) IVPB 1000 mg/200 mL premix        1,000 mg 200 mL/hr over 60 Minutes Intravenous  Once 10/29/20 1614 10/29/20 1911   10/29/20 1615  ceFEPIme (MAXIPIME) 2 g in sodium chloride 0.9 % 100 mL IVPB        2 g 200 mL/hr over 30 Minutes Intravenous  Once 10/29/20 1614  10/29/20 1911       Assessment/Plan: s/p Procedure(s): DEBRIDEMENT RIGHT FOOT WOUND  Plan for debridement of right lower extremity wound, application of wound matrix and application of wound VAC on 11/09/2018 with Dr. Claudia Desanctis.  N.p.o. after midnight tonight.  All the patient's questions were answered to her content, we discussed the scheduled surgical procedure.  Will need assistance from case management with setting up home health RN upon discharge and home wound VAC.   Recommend continuing with protein shakes as able   LOS: 9 days    Charlies Constable, PA-C 11/07/2020

## 2020-11-07 NOTE — Progress Notes (Signed)
Nutrition Follow-up  INTERVENTION:   -Ensure Surgery PO BID, each provides 330 kcals and 18g protein   -Multivitamin with minerals daily   -Vitamin C 250 mg BID  NUTRITION DIAGNOSIS:   Increased nutrient needs related to wound healing as evidenced by estimated needs.  Ongoing.  GOAL:   Patient will meet greater than or equal to 90% of their needs  Progressing.  MONITOR:   PO intake, Supplement acceptance, Labs, Weight trends, I & O's, Diet advancement  REASON FOR ASSESSMENT:   Consult Wound healing  ASSESSMENT:   80 year old female with history of depression, hypertension, but otherwise fairly healthy who comes into the hospital with persistent right ankle cellulitis.  She was recently hospitalized 8/27-9/7 for right ankle cellulitis.  9/20: s/p rt foot debridement  Patient is currently consuming 100% of meals. Will continue current supplements ordered. Will be NPO after midnight for planned further debridement, application of wound matrix and Wound VAC 9/27.  Admission weight: 143 lbs. Needs updated weight for admission.  Medications: Vitamin C, [place], Multivitamin with minerals daily  Labs reviewed.  Diet Order:   Diet Order             Diet NPO time specified  Diet effective midnight           Diet regular Room service appropriate? Yes; Fluid consistency: Thin  Diet effective now                   EDUCATION NEEDS:   No education needs have been identified at this time  Skin:  Skin Assessment: Skin Integrity Issues: Skin Integrity Issues:: Incisions Incisions: 9/2 rt foot  Last BM:  9/19  Height:   Ht Readings from Last 1 Encounters:  11/01/20 5\' 3"  (1.6 m)    Weight:   Wt Readings from Last 1 Encounters:  11/01/20 64.9 kg    BMI:  Body mass index is 25.35 kg/m.  Estimated Nutritional Needs:   Kcal:  1800-2000  Protein:  85-100g  Fluid:  2L/day  Clayton Bibles, MS, RD, LDN Inpatient Clinical Dietitian Contact  information available via Amion

## 2020-11-07 NOTE — TOC Initial Note (Signed)
Transition of Care [facility] - Initial/Assessment Note    Patient Details  Name: [name] Price MRN: [id number] Date of Birth: 1940-05-15  Transition of [facility]) CM/SW Contact:    Lynnell Catalan, RN Phone Number: 11/07/2020, 2:17 PM  Clinical Narrative:                 [facility] consult for home VAC. Leontine Locket from [facility] contacted to alert of need for home vac at DC. Pt to go to the OR tomorrow for vac placement. Olivia Mackie to send docu sign order to [employer] [facility]. Wound measurements will be needed in OR note for home vac.  Pt was active with [place] for home health RN prior to admission. [place] liaison contacted to alert of need for home health RN at dc for vac change 2-3 times per week at home. Will need home health RN orders with specific dressing change instructions prior to dc. TOC will continue to follow.  Expected Discharge Plan: [place] Barriers to Discharge: Continued Medical Work up    Expected Discharge Plan and Services Expected Discharge Plan: [place]   Discharge Planning Services: CM Consult                     DME Arranged: Vac DME Agency: [employer] Date DME Agency Contacted: 11/07/20 Time DME Agency Contacted: [id number] Representative spoke with at DME Agency: Leontine Locket HH Arranged: RN [place] Agency: [place]        Prior Living Arrangements/Services   Lives with:: Spouse Patient language and need for interpreter reviewed:: Yes        Need for Family Participation in Patient Care: Yes (Comment) Care giver support system in place?: Yes (comment) Current home services: Home RN Criminal Activity/Legal Involvement Pertinent to Current Situation/Hospitalization: No - Comment as needed  Activities of Daily Living Home Assistive Devices/Equipment: Walker (specify type) ADL Screening (condition at time of admission) Patient's cognitive ability adequate to safely complete daily activities?: Yes Is the patient deaf  or have difficulty hearing?: No Does the patient have difficulty seeing, even when wearing glasses/contacts?: No Does the patient have difficulty concentrating, remembering, or making decisions?: No Patient able to express need for assistance with ADLs?: Yes Does the patient have difficulty dressing or bathing?: No Independently performs ADLs?: No Communication: Independent Dressing (OT): Independent Grooming: Independent Feeding: Independent Bathing: Needs assistance Is this a change from baseline?: Change from baseline, expected to last <3 days Toileting: Needs assistance Is this a change from baseline?: Change from baseline, expected to last <3 days In/Out Bed: Needs assistance Is this a change from baseline?: Change from baseline, expected to last <3 days Walks in Home: Independent with device (comment) Does the patient have difficulty walking or climbing stairs?: Yes Weakness of Legs: Right Weakness of Arms/Hands: None     Admission diagnosis:  Right foot infection [L08.9] Wound of right foot [S91.301A] Patient Active Problem List   Diagnosis Date Noted   Wound of right foot 10/29/2020   Anemia 10/29/2020   HTN (hypertension) 10/29/2020   Sepsis ([place])    Rash 09/05/2020   History of hip replacement    Advance care planning 06/25/2017   Health care maintenance 06/25/2017   Hot flashes 06/25/2017   BPV (benign positional vertigo) 03/15/2015   Ganglion cyst of wrist 02/23/2015   Extensor tendon rupture of hand 02/01/2015   Left wrist effusion 01/26/2015   KNEE PAIN, RIGHT 11/13/2007   CHONDROMALACIA  PATELLA, LEFT 03/19/2007   Osteoporosis 03/19/2007   HEMATURIA, HX OF 03/19/2007   DIVERTICULOSIS, COLON 08/10/2002   HYPERCHOLESTEROLEMIA 06/05/1996   PCP:  Tonia Ghent, MD Pharmacy:   [id number] [place], [address] [address] Phone: [phone] Fax: [phone]     Social Determinants of Health (SDOH)  Interventions    Readmission Risk Interventions Readmission Risk Prevention Plan 11/07/2020  Transportation Screening Complete  PCP or Specialist Appt within 5-7 Days Complete  Home Care Screening Complete  Medication Review (RN CM) Complete  Some recent data might be hidden

## 2020-11-07 NOTE — Progress Notes (Signed)
Orthopedic Tech Progress Note Patient Details:  [name] Price 30-Aug-1940 [id number]  Ortho Devices Type of Ortho Device: CAM walker Ortho Device/Splint Location: right   Post Interventions Patient Tolerated: Well Instructions Provided: Care of device  [name] 11/07/2020, 6:12 PM

## 2020-11-08 ENCOUNTER — Inpatient Hospital Stay (HOSPITAL_COMMUNITY): Payer: Medicare Other | Admitting: Anesthesiology

## 2020-11-08 ENCOUNTER — Other Ambulatory Visit: Payer: Self-pay

## 2020-11-08 ENCOUNTER — Encounter (HOSPITAL_COMMUNITY)
Admission: EM | Disposition: A | Discharge status: Home / Self Care | Payer: Self-pay | Source: Home / Self Care | Attending: Internal Medicine

## 2020-11-08 ENCOUNTER — Encounter (HOSPITAL_COMMUNITY): Payer: Self-pay | Admitting: Internal Medicine

## 2020-11-08 DIAGNOSIS — L97511 Non-pressure chronic ulcer of other part of right foot limited to breakdown of skin: Secondary | ICD-10-CM

## 2020-11-08 HISTORY — PX: APPLICATION OF WOUND VAC: SHX5189

## 2020-11-08 HISTORY — PX: DEBRIDEMENT AND CLOSURE WOUND: SHX5614

## 2020-11-08 SURGERY — DEBRIDEMENT, WOUND, WITH CLOSURE
Anesthesia: General | Site: Ankle | Laterality: Right

## 2020-11-08 MED ORDER — BUPIVACAINE-EPINEPHRINE (PF) 0.25% -1:200000 IJ SOLN
INTRAMUSCULAR | Status: DC | PRN
  Administered 2020-11-08: 30 mL

## 2020-11-08 MED ORDER — FENTANYL CITRATE (PF) 100 MCG/2ML IJ SOLN
INTRAMUSCULAR | Status: DC | PRN
  Administered 2020-11-08 (×4): 25 ug via INTRAVENOUS

## 2020-11-08 MED ORDER — EPHEDRINE 5 MG/ML INJ
INTRAVENOUS | Status: AC
  Filled 2020-11-08: qty 10

## 2020-11-08 MED ORDER — BUPIVACAINE HCL (PF) 0.25 % IJ SOLN
INTRAMUSCULAR | Status: AC
  Filled 2020-11-08: qty 30

## 2020-11-08 MED ORDER — CEFAZOLIN SODIUM-DEXTROSE 2-4 GM/100ML-% IV SOLN
2.0000 g | INTRAVENOUS | Status: AC
  Administered 2020-11-08: 2 g via INTRAVENOUS
  Filled 2020-11-08: qty 100

## 2020-11-08 MED ORDER — EPHEDRINE SULFATE 50 MG/ML IJ SOLN
INTRAMUSCULAR | Status: DC | PRN
  Administered 2020-11-08: 10 mg via INTRAVENOUS

## 2020-11-08 MED ORDER — SODIUM CHLORIDE 0.9 % IR SOLN
Status: DC | PRN
  Administered 2020-11-08: 1000 mL

## 2020-11-08 MED ORDER — OXYCODONE HCL 5 MG/5ML PO SOLN: 5.0000 mg | Freq: Once | ORAL | Status: DC | PRN

## 2020-11-08 MED ORDER — ONDANSETRON HCL 4 MG/2ML IJ SOLN
INTRAMUSCULAR | Status: AC
  Filled 2020-11-08: qty 2

## 2020-11-08 MED ORDER — FENTANYL CITRATE (PF) 100 MCG/2ML IJ SOLN
INTRAMUSCULAR | Status: AC
  Filled 2020-11-08: qty 2

## 2020-11-08 MED ORDER — PROMETHAZINE HCL 25 MG/ML IJ SOLN: 6.2500 mg | INTRAMUSCULAR | Status: DC | PRN

## 2020-11-08 MED ORDER — FENTANYL CITRATE PF 50 MCG/ML IJ SOSY: 25.0000 ug | PREFILLED_SYRINGE | INTRAMUSCULAR | Status: DC | PRN

## 2020-11-08 MED ORDER — PHENYLEPHRINE 40 MCG/ML (10ML) SYRINGE FOR IV PUSH (FOR BLOOD PRESSURE SUPPORT)
PREFILLED_SYRINGE | INTRAVENOUS | Status: AC
  Filled 2020-11-08: qty 30

## 2020-11-08 MED ORDER — DEXAMETHASONE SODIUM PHOSPHATE 10 MG/ML IJ SOLN
INTRAMUSCULAR | Status: DC | PRN
  Administered 2020-11-08: 4 mg via INTRAVENOUS

## 2020-11-08 MED ORDER — DEXAMETHASONE SODIUM PHOSPHATE 10 MG/ML IJ SOLN
INTRAMUSCULAR | Status: AC
  Filled 2020-11-08: qty 1

## 2020-11-08 MED ORDER — LACTATED RINGERS IV SOLN: INTRAVENOUS | Status: DC | PRN

## 2020-11-08 MED ORDER — OXYCODONE HCL 5 MG PO TABS
5.0000 mg | ORAL_TABLET | Freq: Once | ORAL | Status: DC | PRN
Start: 2020-11-08 — End: 2020-11-08

## 2020-11-08 MED ORDER — PROPOFOL 10 MG/ML IV BOLUS
INTRAVENOUS | Status: AC
  Filled 2020-11-08: qty 20

## 2020-11-08 MED ORDER — PROPOFOL 10 MG/ML IV BOLUS
INTRAVENOUS | Status: DC | PRN
  Administered 2020-11-08: 80 mg via INTRAVENOUS
  Administered 2020-11-08: 120 mg via INTRAVENOUS

## 2020-11-08 MED ORDER — ONDANSETRON HCL 4 MG/2ML IJ SOLN
INTRAMUSCULAR | Status: DC | PRN
  Administered 2020-11-08: 4 mg via INTRAVENOUS

## 2020-11-08 MED ORDER — LIDOCAINE 2% (20 MG/ML) 5 ML SYRINGE
INTRAMUSCULAR | Status: DC | PRN
  Administered 2020-11-08: 60 mg via INTRAVENOUS

## 2020-11-08 MED ORDER — PHENYLEPHRINE 40 MCG/ML (10ML) SYRINGE FOR IV PUSH (FOR BLOOD PRESSURE SUPPORT)
PREFILLED_SYRINGE | INTRAVENOUS | Status: DC | PRN
  Administered 2020-11-08: 120 ug via INTRAVENOUS

## 2020-11-08 MED ORDER — MIDAZOLAM HCL 2 MG/2ML IJ SOLN: 0.5000 mg | Freq: Once | INTRAMUSCULAR | Status: DC | PRN

## 2020-11-08 MED ORDER — MEPERIDINE HCL 50 MG/ML IJ SOLN: 6.2500 mg | INTRAMUSCULAR | Status: DC | PRN

## 2020-11-08 SURGICAL SUPPLY — 30 items
BAG COUNTER SPONGE SURGICOUNT (BAG) IMPLANT
BLADE HEX COATED 2.75 (ELECTRODE) ×2 IMPLANT
BNDG ELASTIC 6X5.8 VLCR STR LF (GAUZE/BANDAGES/DRESSINGS) ×2 IMPLANT
DECANTER SPIKE VIAL GLASS SM (MISCELLANEOUS) IMPLANT
DRAPE INCISE IOBAN 66X45 STRL (DRAPES) ×4 IMPLANT
DRAPE LAPAROSCOPIC ABDOMINAL (DRAPES) IMPLANT
DRAPE LAPAROTOMY T 102X78X121 (DRAPES) IMPLANT
DRAPE UTILITY XL STRL (DRAPES) ×2 IMPLANT
DRSG EMULSION OIL 3X16 NADH (GAUZE/BANDAGES/DRESSINGS) ×2 IMPLANT
DRSG PAD ABDOMINAL 8X10 ST (GAUZE/BANDAGES/DRESSINGS) ×6 IMPLANT
DRSG VAC ATS LRG SENSATRAC (GAUZE/BANDAGES/DRESSINGS) ×2 IMPLANT
ELECT REM PT RETURN 15FT ADLT (MISCELLANEOUS) ×2 IMPLANT
GAUZE SPONGE 4X4 12PLY STRL (GAUZE/BANDAGES/DRESSINGS) ×2 IMPLANT
GLOVE SURG ENC TEXT LTX SZ7.5 (GLOVE) ×2 IMPLANT
GOWN STRL REUS W/TWL LRG LVL3 (GOWN DISPOSABLE) ×4 IMPLANT
KIT BASIN OR (CUSTOM PROCEDURE TRAY) ×2 IMPLANT
KIT TURNOVER KIT A (KITS) ×2 IMPLANT
MATRIX TISSUE MESHED BI 4X10 (Tissue) ×2 IMPLANT
NEEDLE HYPO 22GX1.5 SAFETY (NEEDLE) IMPLANT
NS IRRIG 1000ML POUR BTL (IV SOLUTION) ×2 IMPLANT
PACK GENERAL/GYN (CUSTOM PROCEDURE TRAY) ×2 IMPLANT
STAPLER VISISTAT (STAPLE) ×4 IMPLANT
STAPLER VISISTAT 35W (STAPLE) ×2 IMPLANT
SURGILUBE 2OZ TUBE FLIPTOP (MISCELLANEOUS) ×2 IMPLANT
SUT MON AB 3-0 SH 27 (SUTURE)
SUT MON AB 3-0 SH27 (SUTURE) IMPLANT
SUT VIC AB 4-0 PS2 27 (SUTURE) IMPLANT
SYR CONTROL 10ML LL (SYRINGE) IMPLANT
TAPE CLOTH SURG 6X10 WHT LF (GAUZE/BANDAGES/DRESSINGS) ×2 IMPLANT
TOWEL OR 17X26 10 PK STRL BLUE (TOWEL DISPOSABLE) ×2 IMPLANT

## 2020-11-08 NOTE — Anesthesia Postprocedure Evaluation (Signed)
Anesthesia Post Note  Patient: RAYLIN DIGUGLIELMO  Procedure(s) Performed: Debridement of r. foot wound (Right: Ankle) APPLICATION OF WOUND VAC (Right: Ankle) APPLICATION OF SKIN SUBSTITUTE (Right: Ankle)     Patient location during evaluation: PACU Anesthesia Type: General Level of consciousness: awake and alert, patient cooperative and oriented Pain management: pain level controlled Vital Signs Assessment: post-procedure vital signs reviewed and stable Respiratory status: spontaneous breathing, nonlabored ventilation and respiratory function stable Cardiovascular status: blood pressure returned to baseline and stable Postop Assessment: no apparent nausea or vomiting Anesthetic complications: no   No notable events documented.  Last Vitals:  Vitals:   11/08/20 1645 11/08/20 1700  BP: (!) 114/58 (!) 114/59  Pulse: 93 93  Resp: 16 19  Temp:  36.9 C  SpO2: 99% 99%    Last Pain:  Vitals:   11/08/20 1700  TempSrc:   PainSc: 2                  Armonie Mettler,E. Delois Tolbert

## 2020-11-08 NOTE — Progress Notes (Signed)
80 year old female status post debridement of right foot wound with application of Integra wound matrix and application of wound VAC.   Patient had wound VAC placed over the distal right foot wound, the application was difficult due to the location of the wound and the wound VAC tape encompassing the toes. **If seal is not adequate throughout the night, can attempt to reseal using wound VAC tape or Ioban wound vac tape.  If after multiple attempts the [facility] does not obtain adequate seal, recommend removing wound VAC.  Of note there are staples holding the VAC sponge in place so these will need to be removed prior to removing the VAC sponge.  Recommended dressing change if VAC is not adequate due to seal: Recommend applying Adaptic over entire wound matrix/wound, cover this with 4 x 4 gauze, ABD, Kerlix, Ace wrap.  Recommend wrapping from distal foot to proximal calf.  Recommend placing patient in cam boot when she arrives back to her room after leaving the PACU.  This should remain in place at all times to prevent shearing of the graft.  Patient is fine to heel touch ambulate, but limit ambulation to prevent shearing of the wound matrix.

## 2020-11-08 NOTE — Progress Notes (Signed)
Id brief note   Patient in surgery  Had discussed with primary/surgery team to send for wound edge for [facility] molecular testing for infectious agent. Lab ordered as miscellaneous sent out    Will follow

## 2020-11-08 NOTE — Progress Notes (Signed)
PROGRESS NOTE    [name] Price  EPP:[id number] DOB: 09/21/1940 DOA: 10/29/2020 PCP: Tonia Ghent, MD    No chief complaint on file.   Brief Narrative:   80 year old lady with prior history of hypertension, depression presents with right ankle cellulitis. She was recently hospitalized from 8/27 till 9/7 for right ankle cellulitis, patient received IV antibiotics.  Orthopedic surgery was consulted and she underwent blister aspiration and eventually discharged home on 10/19/20.  Despite being on antibiotics patient continues to have right foot/ankle redness worsening for purulent discharge and ulceration.  She was admitted on 10/29/2020 and underwent an MRI which was negative for any deep abscesses.  Plastic surgery consulted and she underwent debridement of the right foot, scheduled to undergo repeat debridement and application of the wound VAC today.   Assessment & Plan:   Active Problems:   Sepsis ([place])   Wound of right foot   Anemia   HTN (hypertension)   Right foot cellulitis/wound Sepsis ruled out MRI of the right ankle shows Very large necrotic ulceration involving the lower calf anterolaterally, the lateral ankle, and extending into the lateral dorsal forefoot at least as far as the MTP joints. No osteomyelitis or deep abscesses found. Small effusion of the posterior subtalar joint, without marrow edema along the bony surfaces to further indicate septic arthritis ID consulted, suggested differential could be an autoimmune phenomenon such as pyoderma gangrenosum versus Non- tuberculosis mycobacterial infection.  ID recommends wound edge biopsy and specimen to be sent out to test to [facility] for fungal and afb pcr, for our pathology to test for afb and fungal immunohistochemistry stains Auto immune work up so far is negative. CK wnl,  CRP is 4.2. sed rate is 48.  TSH wnl.  HIV screen is negative.  ABI reviewed.  Plastic surgery consulted and she underwent debridement with  Integra on 11/01/2020. Plan for additional debridement, application of wound matrix and application of wound VAC on today by plastic surgery  she was started on Primaxin on 11/02/2008 and Zyvox and discontinued on 11/03/20 Wound cultures growing  abundant Stenotrophomonas Maltophilia and MRSA, resistant to bactrim.she was transitioned to linezolid and cefnidir on 11/05/20 to complete a 7 day course.  Surgical pathology shows  Inflamed and necrotic skin and subcutaneous tissue. No malignancy. Pain control with fentanyl and typlenol.  She remains afebrile and wbc counts are wnl.   Essential hypertension Blood pressure parameters are optimal Continue with metoprolol 12.5 mg BID.    History of depression Patient on Paxil,  continue the same   Acute anemia of blood loss superimposed on anemia of chronic disease/iron deficiency anemia Iron supplementation on board and 1 unit of  PRBC transfusion ordered. Hemoglobin improved to 9.6 today.     Gerd:  Continue with protonix.    DVT prophylaxis: scd's Code Status: Full code  family Communication: None at bedside Disposition:   Status is: Inpatient  Remains inpatient appropriate because:Ongoing diagnostic testing needed not appropriate for outpatient work up, Unsafe d/c plan, and IV treatments appropriate due to intensity of illness or inability to take PO  Dispo: The patient is from: Home              Anticipated d/c is to:  Pending              Patient currently is not medically stable to d/c.   Difficult to place patient No       Consultants:  Plastic surgery Dr. Claudia Desanctis Orthopedics Dr. Hiram Comber Infectious  disease Dr. Drucilla Schmidt  Procedures: Debridement by plastic surgery on 11/02/2019  Antimicrobials:  Antibiotics Given (last 72 hours)     Date/Time Action Medication Dose   11/05/20 1449 Given   [MAR Hold] linezolid (ZYVOX) tablet 600 mg (MAR Hold since Tue 11/08/2020 at 1337.Hold Reason: Transfer to a Procedural area) 600 mg    11/05/20 1449 Given   [MAR Hold] cefdinir (OMNICEF) capsule 300 mg (MAR Hold since Tue 11/08/2020 at 1337.Hold Reason: Transfer to a Procedural area) 300 mg   11/05/20 2140 Given   [MAR Hold] cefdinir (OMNICEF) capsule 300 mg (MAR Hold since Tue 11/08/2020 at 1337.Hold Reason: Transfer to a Procedural area) 300 mg   11/05/20 2141 Given   [MAR Hold] linezolid (ZYVOX) tablet 600 mg (MAR Hold since Tue 11/08/2020 at 1337.Hold Reason: Transfer to a Procedural area) 600 mg   11/06/20 0813 Given   [MAR Hold] cefdinir (OMNICEF) capsule 300 mg (MAR Hold since Tue 11/08/2020 at 1337.Hold Reason: Transfer to a Procedural area) 300 mg   11/06/20 1245 Given   [MAR Hold] linezolid (ZYVOX) tablet 600 mg (MAR Hold since Tue 11/08/2020 at 1337.Hold Reason: Transfer to a Procedural area) 600 mg   11/06/20 2102 Given   [MAR Hold] linezolid (ZYVOX) tablet 600 mg (MAR Hold since Tue 11/08/2020 at 1337.Hold Reason: Transfer to a Procedural area) 600 mg   11/06/20 2103 Given   [MAR Hold] cefdinir (OMNICEF) capsule 300 mg (MAR Hold since Tue 11/08/2020 at 1337.Hold Reason: Transfer to a Procedural area) 300 mg   11/07/20 0955 Given   [MAR Hold] linezolid (ZYVOX) tablet 600 mg (MAR Hold since Tue 11/08/2020 at 1337.Hold Reason: Transfer to a Procedural area) 600 mg   11/07/20 0956 Given   [MAR Hold] cefdinir (OMNICEF) capsule 300 mg (MAR Hold since Tue 11/08/2020 at 1337.Hold Reason: Transfer to a Procedural area) 300 mg   11/07/20 2143 Given   [MAR Hold] cefdinir (OMNICEF) capsule 300 mg (MAR Hold since Tue 11/08/2020 at 1337.Hold Reason: Transfer to a Procedural area) 300 mg   11/07/20 2247 Given   [MAR Hold] linezolid (ZYVOX) tablet 600 mg (MAR Hold since Tue 11/08/2020 at 1337.Hold Reason: Transfer to a Procedural area) 600 mg   11/08/20 0823 Given   [MAR Hold] linezolid (ZYVOX) tablet 600 mg (MAR Hold since Tue 11/08/2020 at 1337.Hold Reason: Transfer to a Procedural area) 600 mg   11/08/20 0823 Given   [MAR Hold]  cefdinir (OMNICEF) capsule 300 mg (MAR Hold since Tue 11/08/2020 at 1337.Hold Reason: Transfer to a Procedural area) 300 mg         Subjective: No new complaints   Objective: Vitals:   11/07/20 2025 11/08/20 0442 11/08/20 1327 11/08/20 1343  BP: 101/65 118/71 (!) 115/51 111/63  Pulse: 81 75 68 70  Resp: 14 14 16 16   Temp: 99.1 F (37.3 C) 98.6 F (37 C) 98.6 F (37 C) 98.9 F (37.2 C)  TempSrc: Oral Oral Oral Oral  SpO2: 97% 97% 96% 96%  Weight:      Height:        Intake/Output Summary (Last 24 hours) at 11/08/2020 1439 Last data filed at 11/07/2020 2159 Gross per 24 hour  Intake 935 ml  Output --  Net 935 ml    Filed Weights   10/29/20 1318 11/01/20 1216  Weight: 64.9 kg 64.9 kg    Examination:  General exam: Appears calm and comfortable  Respiratory system: Clear to auscultation. Respiratory effort normal. Cardiovascular system: S1 & S2 heard, RRR. No JVD,  murmurs, rubs, gallops or clicks. No pedal edema. Gastrointestinal system: Abdomen is nondistended, soft and nontender. No organomegaly or masses felt. Normal bowel sounds heard. Central nervous system: Alert and oriented. No focal neurological deficits. Extremities:right foot ulcer bandaged.  Skin: see below Psychiatry: Judgement and insight appear normal. Mood & affect appropriate.             Data Reviewed: I have personally reviewed following labs and imaging studies  CBC: Recent Labs  Lab 11/02/20 0501 11/03/20 0942 11/06/20 1551  WBC 6.8 7.5 6.6  NEUTROABS  --  4.9 4.3  HGB 6.8* 8.2* 9.6*  HCT 21.7* 25.9* 30.5*  MCV 96.9 95.6 95.3  PLT 363 397 452*     Basic Metabolic Panel: Recent Labs  Lab 11/02/20 0501 11/03/20 0942 11/06/20 1551  NA 135 142 135  K 4.8 4.3 4.6  CL 104 107 101  CO2 26 26 26   GLUCOSE 134* 92 105*  BUN 17 20 21   CREATININE 0.55 0.64 0.83  CALCIUM 8.1* 8.6* 8.8*     GFR: Estimated Creatinine Clearance: 51.4 mL/min (by C-G formula based on SCr of  0.83 mg/dL).  Liver Function Tests: No results for input(s): AST, ALT, ALKPHOS, BILITOT, PROT, ALBUMIN in the last 168 hours.   CBG: No results for input(s): GLUCAP in the last 168 hours.   Recent Results (from the past 240 hour(s))  Fungus Culture With Stain     Status: None (Preliminary result)   Collection Time: 11/01/20  2:14 PM   Specimen: PATH Soft tissue  Result Value Ref Range Status   Fungus Stain Final report  Final    Comment: (NOTE) Performed At: [facility] [place], [place] [id number] Rush Farmer MD [id number]    Fungus (Mycology) Culture PENDING  Incomplete   Fungal Source TISSUE  Final    Comment: RIGHT FOOT Performed at [facility], [place] [address] [place], [place] [address]   Aerobic/Anaerobic Culture w Gram Stain (surgical/deep wound)     Status: None   Collection Time: 11/01/20  2:14 PM   Specimen: PATH Soft tissue  Result Value Ref Range Status   Specimen Description   Final    TISSUE RIGHT FOOT Performed at [facility] [address] [place], [place] [address]    Special Requests   Final    NONE Performed at [facility], [place] [address], [place], [place] [address]    Gram Stain   Final    ABUNDANT WBC PRESENT, PREDOMINANTLY PMN RARE GRAM POSITIVE COCCI IN PAIRS FEW GRAM NEGATIVE RODS    Culture   Final    ABUNDANT STENOTROPHOMONAS MALTOPHILIA FEW METHICILLIN RESISTANT STAPHYLOCOCCUS AUREUS NO ANAEROBES ISOLATED Performed at [facility], [place] [address] [place], [place] [address]    Report Status 11/07/2020 FINAL  Final   Organism ID, Bacteria STENOTROPHOMONAS MALTOPHILIA  Final   Organism ID, Bacteria METHICILLIN RESISTANT STAPHYLOCOCCUS AUREUS  Final      Susceptibility   Methicillin resistant staphylococcus aureus - MIC*    CIPROFLOXACIN >=8 RESISTANT Resistant     ERYTHROMYCIN >=8 RESISTANT Resistant     GENTAMICIN <=0.5 SENSITIVE Sensitive      OXACILLIN >=4 RESISTANT Resistant     TETRACYCLINE <=1 SENSITIVE Sensitive     VANCOMYCIN <=0.5 SENSITIVE Sensitive     TRIMETH/SULFA >=320 RESISTANT Resistant     CLINDAMYCIN <=0.25 SENSITIVE Sensitive     RIFAMPIN <=0.5 SENSITIVE Sensitive     Inducible  Clindamycin NEGATIVE Sensitive     * FEW METHICILLIN RESISTANT STAPHYLOCOCCUS AUREUS   Stenotrophomonas maltophilia - MIC*    LEVOFLOXACIN 0.5 SENSITIVE Sensitive     TRIMETH/SULFA <=20 SENSITIVE Sensitive     * ABUNDANT STENOTROPHOMONAS MALTOPHILIA  Acid Fast Smear (AFB)     Status: None   Collection Time: 11/01/20  2:14 PM   Specimen: PATH Soft tissue  Result Value Ref Range Status   AFB Specimen Processing Comment  Final    Comment: Tissue Grinding and Digestion/Decontamination   Acid Fast Smear Negative  Final    Comment: (NOTE) Performed At: [facility] [place], [place] [id number] Rush Farmer MD YW:[id number]    Source (AFB) TISSUE  Final    Comment: RIGHT FOOT Performed at [facility] [place] [address], [place], [place] [address]   Fungus Culture Result     Status: None   Collection Time: 11/01/20  2:14 PM  Result Value Ref Range Status   Result 1 Comment  Final    Comment: (NOTE) KOH/Calcofluor preparation:  no fungus observed. Performed At: [facility] [place], [place] [id number] Rush Farmer MD HW:[id number]   Surgical pcr screen     Status: None   Collection Time: 11/07/20  9:20 PM   Specimen: Nasal Mucosa; Nasal Swab  Result Value Ref Range Status   MRSA, PCR NEGATIVE NEGATIVE Final   Staphylococcus aureus NEGATIVE NEGATIVE Final    Comment: (NOTE) The Xpert SA Assay (FDA approved for NASAL specimens in patients 83 years of age and older), is one component of a comprehensive surveillance program. It is not intended to diagnose infection nor to guide or monitor treatment. Performed at [facility], [place] [address] [place], [place] [address]           Radiology Studies: No results found.      Scheduled Meds:  [MAR Hold] vitamin C  250 mg Oral BID   [MAR Hold] cefdinir  300 mg Oral Q12H   [MAR Hold] Chlorhexidine Gluconate Cloth  6 each Topical Q0600   [MAR Hold] feeding supplement  237 mL Oral BID BM   [MAR Hold] ferrous gluconate  324 mg Oral BID WC   [MAR Hold] linezolid  600 mg Oral Q12H   [MAR Hold] metoprolol tartrate  12.5 mg Oral BID   [MAR Hold] multivitamin with minerals  1 tablet Oral Daily   [MAR Hold] mupirocin ointment  1 application Nasal BID   [MAR Hold] pantoprazole  40 mg Oral BID   [MAR Hold] PARoxetine  25 mg Oral q morning   Continuous Infusions:   ceFAZolin (ANCEF) IV        LOS: 10 days       Hosie Poisson, MD [facility]   To contact the attending provider between 7A-7P or the covering provider during after hours 7P-7A, please log into the web site [email or web address] and access using universal [place] password for that web site. If you do not have the password, please call the hospital operator.  11/08/2020, 2:39 PM

## 2020-11-08 NOTE — Transfer of Care (Signed)
Immediate Anesthesia Transfer of Care Note  Patient: [name] Price  Procedure(s) Performed: Debridement of r. foot wound (Right: Ankle) APPLICATION OF WOUND VAC (Right: Ankle) APPLICATION OF SKIN SUBSTITUTE (Right: Ankle)  Patient Location: PACU  Anesthesia Type:General  Level of Consciousness: awake  Airway & Oxygen Therapy: Patient Spontanous Breathing and Patient connected to face mask oxygen  Post-op Assessment: Report given to RN and Post -op Vital signs reviewed and stable  Post vital signs: Reviewed and stable  Last Vitals:  Vitals Value Taken Time  BP 126/36 11/08/20 1634  Temp    Pulse 98 11/08/20 1638  Resp 17 11/08/20 1638  SpO2 100 % 11/08/20 1638  Vitals shown include unvalidated device data.  Last Pain:  Vitals:   11/08/20 1403  TempSrc:   PainSc: 2       Patients Stated Pain Goal: 3 (39/53/20 2334)  Complications: No notable events documented.

## 2020-11-08 NOTE — Anesthesia Procedure Notes (Signed)
Procedure Name: LMA Insertion Date/Time: 11/08/2020 3:30 PM Performed by: Milford Cage, CRNA Pre-anesthesia Checklist: Patient identified, Emergency Drugs available, Suction available and Patient being monitored Patient Re-evaluated:Patient Re-evaluated prior to induction Oxygen Delivery Method: Circle system utilized Preoxygenation: Pre-oxygenation with 100% oxygen Induction Type: IV induction Ventilation: Mask ventilation without difficulty LMA: LMA inserted LMA Size: 4.0 Number of attempts: 1 Tube secured with: Tape Dental Injury: Teeth and Oropharynx as per pre-operative assessment

## 2020-11-08 NOTE — Brief Op Note (Signed)
10/29/2020 - 11/08/2020  4:25 PM  PATIENT:  [name] Price  80 y.o. female  PRE-OPERATIVE DIAGNOSIS:  Right ankle wound  POST-OPERATIVE DIAGNOSIS:  Right ankle wound  PROCEDURE:  Procedure(s): Debridement of r. foot wound (Right) APPLICATION OF WOUND VAC (Right) APPLICATION OF SKIN SUBSTITUTE (Right)  SURGEON:  Surgeon(s) and Role:    * Ketih Goodie, Steffanie Dunn, MD - Primary  PHYSICIAN ASSISTANT: Software engineer, PA  ASSISTANTS: none   ANESTHESIA:   general  EBL:  25   BLOOD ADMINISTERED:none  DRAINS: none   LOCAL MEDICATIONS USED:  MARCAINE     SPECIMEN:  Source of Specimen:  tissue biopsy  DISPOSITION OF SPECIMEN:  PATHOLOGY  COUNTS:  YES  TOURNIQUET:  * Missing tourniquet times found for documented tourniquets in log: [id number] *  DICTATION: .Viviann Spare Dictation  PLAN OF CARE: Admit to inpatient   PATIENT DISPOSITION:  PACU - hemodynamically stable.   Delay start of Pharmacological VTE agent (>24hrs) due to surgical blood loss or risk of bleeding: not applicable

## 2020-11-08 NOTE — Anesthesia Preprocedure Evaluation (Addendum)
Anesthesia Evaluation  Patient identified by MRN, date of birth, ID band Patient awake    Reviewed: Allergy & Precautions, NPO status , Patient's Chart, lab work & pertinent test results, reviewed documented beta blocker date and time   History of Anesthesia Complications Negative for: history of anesthetic complications  Airway Mallampati: II  TM Distance: >3 FB Neck ROM: Full    Dental  (+) Edentulous Upper, Partial Lower, Dental Advisory Given   Pulmonary neg pulmonary ROS,    breath sounds clear to auscultation       Cardiovascular hypertension, Pt. on medications and Pt. on home beta blockers (-) angina Rhythm:Regular Rate:Normal  10/16/2020 ECHO: EF 60-65%, normal LVF, mild MR, mild-mod TR   Neuro/Psych Anxiety negative neurological ROS     GI/Hepatic negative GI ROS, Neg liver ROS,   Endo/Other  negative endocrine ROS  Renal/GU negative Renal ROS     Musculoskeletal   Abdominal   Peds  Hematology  (+) Blood dyscrasia (Hb 9.6), anemia ,   Anesthesia Other Findings H/o breast cancer  Reproductive/Obstetrics                            Anesthesia Physical Anesthesia Plan  ASA: 2  Anesthesia Plan: General   Post-op Pain Management:    Induction: Intravenous  PONV Risk Score and Plan: 3 and Ondansetron, Dexamethasone and Treatment may vary due to age or medical condition  Airway Management Planned: LMA  Additional Equipment: None  Intra-op Plan:   Post-operative Plan:   Informed Consent: I have reviewed the patients History and Physical, chart, labs and discussed the procedure including the risks, benefits and alternatives for the proposed anesthesia with the patient or authorized representative who has indicated his/her understanding and acceptance.     Dental advisory given  Plan Discussed with: CRNA and Surgeon  Anesthesia Plan Comments:         Anesthesia Quick  Evaluation

## 2020-11-08 NOTE — Op Note (Signed)
Operative Note   DATE OF OPERATION: 11/08/2020  SURGICAL DEPARTMENT: Plastic Surgery  PREOPERATIVE DIAGNOSES: Right foot wound  POSTOPERATIVE DIAGNOSES:  same  PROCEDURE: 1.  Excisional debridement right foot wound including skin and subcutaneous tissue totaling 24 x 11 cm 2.  Application of Integra meshed bilayer skin substitute to right foot wound totaling 24 x 11 cm 3.  Wound VAC application right foot totaling 24 x 11 cm  SURGEON: Talmadge Coventry, MD  ASSISTANT: Verdie Shire, PA The advanced practice practitioner (APP) assisted throughout the case.  The APP was essential in retraction and counter traction when needed to make the case progress smoothly.  This retraction and assistance made it possible to see the tissue plans for the procedure.  The assistance was needed for blood control, tissue re-approximation and assisted with closure of the incision site.  ANESTHESIA:  General.   COMPLICATIONS: None.   INDICATIONS FOR PROCEDURE:  The patient, Denishia Citro is a 80 y.o. female born on [birth year], is here for treatment of right foot wound MRN: [id number]  CONSENT:  Informed consent was obtained directly from the patient. Risks, benefits and alternatives were fully discussed. Specific risks including but not limited to bleeding, infection, hematoma, seroma, scarring, pain, contracture, asymmetry, wound healing problems, and need for further surgery were all discussed. The patient did have an ample opportunity to have questions answered to satisfaction.   DESCRIPTION OF PROCEDURE:  The patient was taken to the operating room. SCDs were placed and antibiotics were given.  General anesthesia was administered.  The patient's operative site was prepped and draped in a sterile fashion. A time out was performed and all information was confirmed to be correct.  Started by inspecting the wound.  This looks much improved from the week before.  The majority of the wound bed was covered  by healthy granulation tissue.  No purulence or drainage from the surrounding areas.  Very little erythema.  I then infiltrated Marcaine with epinephrine circumferentially around the wound.  I then used a curette to debride any nonviable tissue.  This including skin and subcutaneous tissue.  I did take a biopsy of the periwound skin and sent this in accordance with the orders of our medicine colleagues.  Wound dimensions measured 24 x 11 cm.  There was no outright exposure of tendons or bone.  I then irrigated the wound with Irrisept irrigation.  This was followed by over a liter of pulse lavage.  The Integra meshed bilayer wound matrix was then brought onto the field and rinsed in saline.  This was then inset into the wound with staples.  A wound VAC was then applied with black sponge and Ioban.  Soft wrap was then applied.  The patient tolerated the procedure well.  There were no complications. The patient was allowed to wake from anesthesia, extubated and taken to the recovery room in satisfactory condition.

## 2020-11-08 NOTE — Interval H&P Note (Signed)
History and Physical Interval Note:  11/08/2020 2:20 PM  [name] Price  has presented today for surgery, with the diagnosis of Right ankle wound.  The various methods of treatment have been discussed with the patient and family. After consideration of risks, benefits and other options for treatment, the patient has consented to  Procedure(s): Debridement of r. foot wound (Right) APPLICATION OF WOUND VAC (Right) APPLICATION OF SKIN SUBSTITUTE (Right) as a surgical intervention.  The patient's history has been reviewed, patient examined, no change in status, stable for surgery.  I have reviewed the patient's chart and labs.  Questions were answered to the patient's satisfaction.     [name]

## 2020-11-09 ENCOUNTER — Encounter (HOSPITAL_COMMUNITY): Payer: Self-pay | Admitting: Plastic Surgery

## 2020-11-09 DIAGNOSIS — F32A Depression, unspecified: Secondary | ICD-10-CM

## 2020-11-09 DIAGNOSIS — K219 Gastro-esophageal reflux disease without esophagitis: Secondary | ICD-10-CM

## 2020-11-09 LAB — CBC
HCT: 27.4 % — ABNORMAL LOW (ref 36.0–46.0)
Hemoglobin: 8.5 g/dL — ABNORMAL LOW (ref 12.0–15.0)
MCH: 29.8 pg (ref 26.0–34.0)
MCHC: 31 g/dL (ref 30.0–36.0)
MCV: 96.1 fL (ref 80.0–100.0)
Platelets: 338 10*3/uL (ref 150–400)
RBC: 2.85 MIL/uL — ABNORMAL LOW (ref 3.87–5.11)
RDW: 14.2 % (ref 11.5–15.5)
WBC: 6.5 10*3/uL (ref 4.0–10.5)
nRBC: 0 % (ref 0.0–0.2)

## 2020-11-09 MED ORDER — METOPROLOL TARTRATE 25 MG PO TABS: 12.5000 mg | ORAL_TABLET | Freq: Two times a day (BID) | ORAL | Status: DC

## 2020-11-09 MED ORDER — SODIUM CHLORIDE 0.9 % IV SOLN: INTRAVENOUS | Status: DC

## 2020-11-09 NOTE — Progress Notes (Signed)
PROGRESS NOTE    [name] Price  ZMO:[id number] DOB: November 23, 1940 DOA: 10/29/2020 PCP: Tonia Ghent, MD (Confirm with patient/family/NH records and if not entered, this HAS to be entered at [facility] point of entry. "No PCP" if truly none.)   No chief complaint on file.   Brief Narrative:  80 year old lady with prior history of hypertension, depression presents with right ankle cellulitis. She was recently hospitalized from 8/27 till 9/7 for right ankle cellulitis, patient received IV antibiotics.  Orthopedic surgery was consulted and she underwent blister aspiration and eventually discharged home on 10/19/20.  Despite being on antibiotics patient continues to have right foot/ankle redness worsening for purulent discharge and ulceration.  She was admitted on 10/29/2020 and underwent an MRI which was negative for any deep abscesses.  Plastic surgery consulted and she underwent debridement of the right foot, scheduled to undergo repeat debridement and application of the wound VAC 11/08/2020.   Assessment & Plan:   Active Problems:   Sepsis ([place])   Wound of right foot   Anemia   HTN (hypertension)  #1 chronic right foot wound/cellulitis -Sepsis ruled out on admission. -MRI of the right ankle showed a very large necrotic ulceration involving the lower calf anterior laterally, lateral ankle, extending into the lateral dorsal forefoot at least as far as the MTP joints. -No osteomyelitis or deep abscess noted.  Small effusion of the posterior subtalar joint, without marrow edema along the bony surfaces to further indicate septic arthritis. -ID consulted and felt differential could be an autoimmune phenomena such as pyoderma gangrenosum versus known to be alkalosis mycobacterial infection. -ID recommended wound edge biopsy and specimen to be sent out to test for UW for fungal and pathology to test for fungal immunochemistry stains. -Autoimmune work-up so far negative to date. -CK within normal  limits.  CRP 4.2, sed rate 48. -TSH within normal limits, HIV nonreactive.  ABIs also done. -Patient seen by plastic surgery underwent debridement with Integra on 11/01/2020 and patient subsequently underwent additional debridement, application of wound matrix and application of wound VAC on 11/08/2020 per plastic surgery. -Patient had multiple antibiotics including Primaxin on 11/02/2020, Zyvox which was subsequently discontinued 11/03/2020. -Wound cultures growing abundant stenotrophomonas maltophilia and MRSA resistant to Bactrim and patient transition back to cefdinir There is a need on 11/05/2020 to complete a 7-day course of treatment per ID recommendations. -Per plastics. -We will need home health therapy and outpatient follow-up with ID and plastic surgery.  2.  Hypertension -Continue metoprolol 12.5 mg twice daily.  3.  Depression Paxil.  4.  Acute anemia of blood loss superimposed on anemia of chronic disease/iron deficiency anemia -Status post transfusion 1 unit packed red blood cells.-Continue oral iron supplementation. -Hemoglobin stable at 8.5. -Transfusion threshold hemoglobin < 7.  5.  GERD -PPI.  -Statins   DVT prophylaxis: SCDs Code Status: Full Family Communication: Updated patient.  No family at bedside. Disposition:   Status is: Inpatient  Remains inpatient appropriate because:Inpatient level of care appropriate due to severity of illness  Dispo: The patient is from: Home              Anticipated d/c is to: Home              Patient currently is not medically stable to d/c.   Difficult to place patient No       Consultants:  Plastic surgery: Dr. Claudia Desanctis 10/31/2020 Orthopedics: Dr.Norris 10/29/2020 ID: Dr. Juleen China 10/30/2020  Procedures:  Plain films of the right foot  10/29/2020 MRI right foot 10/30/2020 ABI 10/31/2020 with/without TBI Excisional debridement right foot wound including skin and subcutaneous tissue, application of Integra meshed bilayer skin  substitute to right foot wound totaling 24 x 11 cm, wound VAC per plastic surgery Dr. Claudia Desanctis 11/08/2020 Transfusion 1 unit packed red blood cells   Antimicrobials:  Omnicef 11/05/2020>>>>> 11/12/2020 Zyvox 11/01/2020>>> 11/02/2020 Bactrim DS 11/02/2020>>>>> 11/05/2020 IV cefepime 10/29/2020>>>> 10/31/2020  IV Primaxin 11/01/2020>>>> 11/02/2020 IV vancomycin 10/30/2020>>>> 10/31/2020 Zyvox 11/05/2020>>>> 11/12/2020      Subjective: Sitting up in chair eating lunch.  Denies any chest pain.  No shortness of breath.  No abdominal pain.  Still with some pain in the right lower extremity but improving.  Overall feeling better.  Concerned about home health being set up prior to discharge as she feels this was one of the reasons why she was admitted readmitted.  Objective: Vitals:   11/08/20 1754 11/08/20 2048 11/08/20 2312 11/09/20 0542  BP: (!) 103/53 111/62 (!) 110/55 91/60  Pulse: 87 99 93 81  Resp: 17 20  20   Temp: 98.1 F (36.7 C) 98 F (36.7 C)  98.3 F (36.8 C)  TempSrc: Oral Oral  Oral  SpO2:  95%  92%  Weight:      Height:        Intake/Output Summary (Last 24 hours) at 11/09/2020 1234 Last data filed at 11/08/2020 1638 Gross per 24 hour  Intake 1100 ml  Output 100 ml  Net 1000 ml   Filed Weights   10/29/20 1318 11/01/20 1216  Weight: 64.9 kg 64.9 kg    Examination:  General exam: Appears calm and comfortable  Respiratory system: Clear to auscultation. Respiratory effort normal. Cardiovascular system: S1 & S2 heard, RRR. No JVD, murmurs, rubs, gallops or clicks. No pedal edema. Gastrointestinal system: Abdomen is nondistended, soft and nontender. No organomegaly or masses felt. Normal bowel sounds heard. Central nervous system: Alert and oriented. No focal neurological deficits. Extremities: Right lower extremity bandaged with wound VAC in place.  Skin: No rashes, lesions or ulcers Psychiatry: Judgement and insight appear normal. Mood & affect appropriate.     Data  Reviewed: I have personally reviewed following labs and imaging studies  CBC: Recent Labs  Lab 11/03/20 0942 11/06/20 1551 11/09/20 0656  WBC 7.5 6.6 6.5  NEUTROABS 4.9 4.3  --   HGB 8.2* 9.6* 8.5*  HCT 25.9* 30.5* 27.4*  MCV 95.6 95.3 96.1  PLT 397 452* 937    Basic Metabolic Panel: Recent Labs  Lab 11/03/20 0942 11/06/20 1551  NA 142 135  K 4.3 4.6  CL 107 101  CO2 26 26  GLUCOSE 92 105*  BUN 20 21  CREATININE 0.64 0.83  CALCIUM 8.6* 8.8*    GFR: Estimated Creatinine Clearance: 51.4 mL/min (by C-G formula based on SCr of 0.83 mg/dL).  Liver Function Tests: No results for input(s): AST, ALT, ALKPHOS, BILITOT, PROT, ALBUMIN in the last 168 hours.  CBG: No results for input(s): GLUCAP in the last 168 hours.   Recent Results (from the past 240 hour(s))  Fungus Culture With Stain     Status: None (Preliminary result)   Collection Time: 11/01/20  2:14 PM   Specimen: PATH Soft tissue  Result Value Ref Range Status   Fungus Stain Final report  Final    Comment: (NOTE) Performed At: [facility] [place], [place] [id number] Rush Farmer MD [id number]    Fungus (Mycology) Culture PENDING  Incomplete   Fungal Source TISSUE  Final    Comment: RIGHT FOOT Performed at [facility] [address] [place], [place] [address]   Aerobic/Anaerobic Culture w Gram Stain (surgical/deep wound)     Status: None   Collection Time: 11/01/20  2:14 PM   Specimen: PATH Soft tissue  Result Value Ref Range Status   Specimen Description   Final    TISSUE RIGHT FOOT Performed at [facility] [address] [place], [place] [address]    Special Requests   Final    NONE Performed at [facility], [place] [address] [place], [place] [address]    Gram Stain   Final    ABUNDANT WBC PRESENT, PREDOMINANTLY PMN RARE GRAM POSITIVE COCCI IN PAIRS FEW GRAM NEGATIVE RODS    Culture   Final    ABUNDANT  STENOTROPHOMONAS MALTOPHILIA FEW METHICILLIN RESISTANT STAPHYLOCOCCUS AUREUS NO ANAEROBES ISOLATED Performed at [facility], [place] [address], [place], [place] [address]    Report Status 11/07/2020 FINAL  Final   Organism ID, Bacteria STENOTROPHOMONAS MALTOPHILIA  Final   Organism ID, Bacteria METHICILLIN RESISTANT STAPHYLOCOCCUS AUREUS  Final      Susceptibility   Methicillin resistant staphylococcus aureus - MIC*    CIPROFLOXACIN >=8 RESISTANT Resistant     ERYTHROMYCIN >=8 RESISTANT Resistant     GENTAMICIN <=0.5 SENSITIVE Sensitive     OXACILLIN >=4 RESISTANT Resistant     TETRACYCLINE <=1 SENSITIVE Sensitive     VANCOMYCIN <=0.5 SENSITIVE Sensitive     TRIMETH/SULFA >=320 RESISTANT Resistant     CLINDAMYCIN <=0.25 SENSITIVE Sensitive     RIFAMPIN <=0.5 SENSITIVE Sensitive     Inducible Clindamycin NEGATIVE Sensitive     * FEW METHICILLIN RESISTANT STAPHYLOCOCCUS AUREUS   Stenotrophomonas maltophilia - MIC*    LEVOFLOXACIN 0.5 SENSITIVE Sensitive     TRIMETH/SULFA <=20 SENSITIVE Sensitive     * ABUNDANT STENOTROPHOMONAS MALTOPHILIA  Acid Fast Smear (AFB)     Status: None   Collection Time: 11/01/20  2:14 PM   Specimen: PATH Soft tissue  Result Value Ref Range Status   AFB Specimen Processing Comment  Final    Comment: Tissue Grinding and Digestion/Decontamination   Acid Fast Smear Negative  Final    Comment: (NOTE) Performed At: [facility] [employer] [place], [place] [id number] Rush Farmer MD GY:[id number]    Source (AFB) TISSUE  Final    Comment: RIGHT FOOT Performed at [facility] [place] [address] [place], [place] [address]   Fungus Culture Result     Status: None   Collection Time: 11/01/20  2:14 PM  Result Value Ref Range Status   Result 1 Comment  Final    Comment: (NOTE) KOH/Calcofluor preparation:  no fungus observed. Performed At: [facility] [place], [place] [id number] Rush Farmer MD  TM:[id number]   Surgical pcr screen     Status: None   Collection Time: 11/07/20  9:20 PM   Specimen: Nasal Mucosa; Nasal Swab  Result Value Ref Range Status   MRSA, PCR NEGATIVE NEGATIVE Final   Staphylococcus aureus NEGATIVE NEGATIVE Final    Comment: (NOTE) The Xpert SA Assay (FDA approved for NASAL specimens in patients 25 years of age and older), is one component of a comprehensive surveillance program. It is not intended to diagnose infection nor to guide or monitor treatment. Performed at [facility], [place] [address], [place], [place] [address]  Radiology Studies: No results found.      Scheduled Meds:  vitamin C  250 mg Oral BID   cefdinir  300 mg Oral Q12H   Chlorhexidine Gluconate Cloth  6 each Topical Q0600   feeding supplement  237 mL Oral BID BM   ferrous gluconate  324 mg Oral BID WC   linezolid  600 mg Oral Q12H   [START ON 11/10/2020] metoprolol tartrate  12.5 mg Oral BID   multivitamin with minerals  1 tablet Oral Daily   mupirocin ointment  1 application Nasal BID   pantoprazole  40 mg Oral BID   PARoxetine  25 mg Oral q morning   Continuous Infusions:  sodium chloride 100 mL/hr at 11/09/20 1014     LOS: 11 days    Time spent: 35 minutes    Irine Seal, MD [facility]   To contact the attending provider between 7A-7P or the covering provider during after hours 7P-7A, please log into the web site [email or web address] and access using universal [place] password for that web site. If you do not have the password, please call the hospital operator.  11/09/2020, 12:34 PM

## 2020-11-09 NOTE — Progress Notes (Signed)
   11/09/20 1200  Mobility  Activity Transferred to/from [facility];Transferred:  Bed to chair  Level of Assistance Contact guard assist, steadying assist  Assistive Device None  Distance Ambulated (ft) 10 ft  Mobility Out of bed for toileting;Out of bed to chair with meals  Mobility Response Tolerated well  Mobility performed by Mobility specialist  $Mobility charge 1 Mobility   Upon entering, pt was in good spirit. Agreed to sit in chair for lunch. Pt asked if she could use BSC prior to sitting. Transferred pt from bed to [facility], about 5 ft, tolerated well. Once finished, had pt ambulate about 56ft to chair. No complaints of pain, dizziness, or any other symptoms. Left pt in chair with call bell at side. Notified RN of session.    [place] [facility] Office: [phone]

## 2020-11-09 NOTE — Progress Notes (Signed)
Diagnosis: Chronic ulcerating wound right foot  Culture Result: wound swab cx mrsa/steno (after a few courses of abx (doxy/augmentin, cefepime/vanc)  Clinical impression:  doesn't appear to have wound associated typical bacterial infection. Patient under the process of getting skin graft so opted to treat 7 days linezolid/cefdinir on 9/25 until 10/01  Concern is for ntm (atypical mycobacterial) process given her scratching and pedicure exposure 2 weeks prior to onset of sx.  9/27 s/p the most recent wound I&D; plastic surgery performed wound edge sampling --> I have requested UW sent out to perform afb/fungal broad range pcr  I have also sent email to pathology 9/26 which I haven't heard back to perform afb/fungal immunohistochemistry stain on her 9/16 pathology sample (which just mentioned necrosis/inflammation)    For now, she can discharge when ready and follow up with me in the id clinic  Clinic Follow Up Appt: 10/24 @ 330pm, with Dr Gale Journey  @  [facility] [address], [place], [place] [address] Phone: [phone]

## 2020-11-10 ENCOUNTER — Other Ambulatory Visit (HOSPITAL_COMMUNITY): Payer: Self-pay

## 2020-11-10 LAB — CBC WITH DIFFERENTIAL/PLATELET
Abs Immature Granulocytes: 0.02 10*3/uL (ref 0.00–0.07)
Basophils Absolute: 0 10*3/uL (ref 0.0–0.1)
Basophils Relative: 1 %
Eosinophils Absolute: 0.1 10*3/uL (ref 0.0–0.5)
Eosinophils Relative: 2 %
HCT: 26.5 % — ABNORMAL LOW (ref 36.0–46.0)
Hemoglobin: 8.1 g/dL — ABNORMAL LOW (ref 12.0–15.0)
Immature Granulocytes: 0 %
Lymphocytes Relative: 29 %
Lymphs Abs: 1.6 10*3/uL (ref 0.7–4.0)
MCH: 30.1 pg (ref 26.0–34.0)
MCHC: 30.6 g/dL (ref 30.0–36.0)
MCV: 98.5 fL (ref 80.0–100.0)
Monocytes Absolute: 0.5 10*3/uL (ref 0.1–1.0)
Monocytes Relative: 9 %
Neutro Abs: 3.4 10*3/uL (ref 1.7–7.7)
Neutrophils Relative %: 59 %
Platelets: 311 10*3/uL (ref 150–400)
RBC: 2.69 MIL/uL — ABNORMAL LOW (ref 3.87–5.11)
RDW: 14.6 % (ref 11.5–15.5)
WBC: 5.6 10*3/uL (ref 4.0–10.5)
nRBC: 0 % (ref 0.0–0.2)

## 2020-11-10 LAB — BASIC METABOLIC PANEL
Anion gap: 4 — ABNORMAL LOW (ref 5–15)
BUN: 17 mg/dL (ref 8–23)
CO2: 26 mmol/L (ref 22–32)
Calcium: 7.8 mg/dL — ABNORMAL LOW (ref 8.9–10.3)
Chloride: 111 mmol/L (ref 98–111)
Creatinine, Ser: 0.57 mg/dL (ref 0.44–1.00)
GFR, Estimated: >60 mL/min (ref >60)
Glucose, Bld: 102 mg/dL — ABNORMAL HIGH (ref 70–99)
Potassium: 4 mmol/L (ref 3.5–5.1)
Sodium: 141 mmol/L (ref 135–145)

## 2020-11-10 MED ORDER — FERROUS GLUCONATE 324 (38 FE) MG PO TABS: 324.0000 mg | ORAL_TABLET | Freq: Two times a day (BID) | ORAL | 3 refills | Status: DC

## 2020-11-10 MED ORDER — CEFDINIR 300 MG PO CAPS
300.0000 mg | ORAL_CAPSULE | Freq: Two times a day (BID) | ORAL | 0 refills | Status: AC
  Filled 2020-11-10: qty 5, 3d supply, fill #0

## 2020-11-10 MED ORDER — LINEZOLID 600 MG PO TABS
600.0000 mg | ORAL_TABLET | Freq: Two times a day (BID) | ORAL | 0 refills | Status: AC
  Filled 2020-11-10: qty 5, 3d supply, fill #0

## 2020-11-10 MED ORDER — PANTOPRAZOLE SODIUM 40 MG PO TBEC
40.0000 mg | DELAYED_RELEASE_TABLET | Freq: Two times a day (BID) | ORAL | 0 refills | Status: DC
  Filled 2020-11-10: qty 60, 30d supply, fill #0

## 2020-11-10 MED ORDER — ENSURE SURGERY PO LIQD
237.0000 mL | Freq: Two times a day (BID) | ORAL | Status: DC
Start: 2020-11-10 — End: 2020-12-08

## 2020-11-10 NOTE — Discharge Summary (Signed)
Physician Discharge Summary  [name] Price TTS:[id number] DOB: 1940/05/29 DOA: 10/29/2020  PCP: Tonia Ghent, MD  Admit date: 10/29/2020 Discharge date: 11/10/2020  Time spent: 50 minutes  Recommendations for Outpatient Follow-up:  Follow-up with Dr. Gale Journey, ID on 12/05/2020 at 3:30 PM. Follow-up with Dr. Claudia Desanctis, plastic surgery in 2 to 3 weeks. Patient was discharged home with home health therapies. Follow-up with Tonia Ghent, MD in 2 weeks.  Patient's antihypertensive medications and blood pressure need to be reassessed as patient's metoprolol was discontinued on discharge.  Patient will need a basic metabolic profile, CBC done to follow-up on electrolytes, renal function, H&H.   Discharge Diagnoses:  Active Problems:   Sepsis ([place])   Wound of right foot   Anemia   HTN (hypertension)   Gastroesophageal reflux disease   Depression   Discharge Condition: Stable and improved.  Diet recommendation: Regular  Filed Weights   10/29/20 1318 11/01/20 1216  Weight: 64.9 kg 64.9 kg    History of present illness:  HPI per Dr. Garnett Farm is a 80 y.o. female with medical history significant of right foot cellulitis, anemia GERD, anxiety, depression      Presented with   continuous inflammation drainage from right foot Patient has recently been admitted from 27 August to 7 September for right foot cellulitis and early sepsis comes back with similar symptoms.  Discharged on Augmentin and doxycycline Patient has been compliant her medications took her last antibiotics today.  She has been followed by home health nurse noted to have still edema and weeping.  No generalized signs no nausea vomiting or diarrhea no fevers or chills But she continues to have purulent discharge. Denies any history of spider bites never seen any spiders in the house.  She does states that about a month ago she woke up with a blister eventually popped and became significant ulceration No prior  history of diabetes or peripheral vascular disease   Reports her stools have been black for the past 2 wks but she though it was due to antibiotics Still a bit loose no diarrhea No blood in stool   She used to be anemic when she was young   Has  been vaccinated against [place] Hospital Course:  1 chronic right foot wound/cellulitis -Sepsis ruled out on admission. -MRI of the right ankle showed a very large necrotic ulceration involving the lower calf anterior laterally, lateral ankle, extending into the lateral dorsal forefoot at least as far as the MTP joints. -No osteomyelitis or deep abscess noted.  Small effusion of the posterior subtalar joint, without marrow edema along the bony surfaces to further indicate septic arthritis. -ID consulted and felt differential could be an autoimmune phenomena such as pyoderma gangrenosum versus known to be alkalosis mycobacterial infection. -ID recommended wound edge biopsy and specimen to be sent out to test for UW for fungal and pathology to test for fungal immunochemistry stains. -Autoimmune work-up so far negative to date. -CK within normal limits.  CRP 4.2, sed rate 48. -TSH within normal limits, HIV nonreactive.  ABIs also done. -Patient seen by plastic surgery underwent debridement with Integra on 11/01/2020 and patient subsequently underwent additional debridement, application of wound matrix and application of wound VAC on 11/08/2020 per plastic surgery. -Patient had multiple antibiotics including Primaxin on 11/02/2020, Zyvox which was initially discontinued 11/03/2020. -Wound cultures growing abundant stenotrophomonas maltophilia and MRSA resistant to Bactrim and patient transition  back to cefdinir and Zyvox with recommendations to complete a 7-day course of treatment per ID recommendations through 11/12/2020. -Patient seen by plastics wound care recommendations/orders placed. -Patient with discharge home with  home health therapies with outpatient follow-up with ID and plastics.  2.  Hypertension -Patient maintained on home regimen metoprolol twice daily however blood pressure was soft/borderline low/metoprolol discontinued on discharge.   -Outpatient follow-up with PCP.  3.  Depression Patient maintained on home regimen Paxil.  4.  Acute anemia of blood loss superimposed on anemia of chronic disease/iron deficiency anemia -Status post transfusion 1 unit packed red blood cells.- -Patient maintained on oral iron supplementation. -Hemoglobin stabilized at 8.1 by day of discharge. -Outpatient follow-up with PCP.    5.  GERD -Patient maintained on PPI.        Procedures: Plain films of the right foot 10/29/2020 MRI right foot 10/30/2020 ABI 10/31/2020 with/without TBI Excisional debridement right foot wound including skin and subcutaneous tissue, application of Integra meshed bilayer skin substitute to right foot wound totaling 24 x 11 cm, wound VAC per plastic surgery Dr. Claudia Desanctis 11/08/2020 Transfusion 1 unit packed red blood cells    Consultations: Plastic surgery: Dr. Claudia Desanctis 10/31/2020 Orthopedics: Dr.Norris 10/29/2020 ID: Dr. Juleen China 10/30/2020  Discharge Exam: Vitals:   11/09/20 2020 11/10/20 0433  BP: (!) 90/53 (!) 101/52  Pulse: 96 89  Resp: 20 19  Temp: 98.5 F (36.9 C) 98.2 F (36.8 C)  SpO2: 98% 95%    General: NAD Cardiovascular: Regular rate and rhythm no murmurs rubs or gallops.  No JVD.  No lower extremity edema. Respiratory: CTA B.  No wheezes, no crackles, no rhonchi.  Discharge Instructions   Discharge Instructions     Diet general   Complete by: As directed    Discharge wound care:   Complete by: As directed    As above   Increase activity slowly   Complete by: As directed       Allergies as of 11/10/2020       Reactions   Codeine Nausea And Vomiting        Medication List     STOP taking these medications    amoxicillin-clavulanate 875-125 MG  tablet Commonly known as: AUGMENTIN   doxycycline 100 MG tablet Commonly known as: VIBRA-TABS   metoprolol tartrate 25 MG tablet Commonly known as: LOPRESSOR       TAKE these medications    acetaminophen 500 MG tablet Commonly known as: TYLENOL Take 500 mg by mouth daily as needed (pain).   acidophilus Caps capsule Take 1 capsule by mouth daily.   CALCIUM-D PO Take 1 tablet by mouth 2 (two) times daily.   cefdinir 300 MG capsule Commonly known as: OMNICEF Take 1 capsule (300 mg total) by mouth every 12 (twelve) hours for 5 doses.   feeding supplement Liqd Take 237 mLs by mouth 2 (two) times daily between meals.   ferrous gluconate 324 MG tablet Commonly known as: FERGON Take 1 tablet (324 mg total) by mouth 2 (two) times daily with a meal.   hydrOXYzine 25 MG tablet Commonly known as: ATARAX/VISTARIL Take 1 tablet (25 mg total) by mouth 3 (three) times daily as needed for itching.   linezolid 600 MG tablet Commonly known as: ZYVOX Take 1 tablet (600 mg total) by mouth every 12 (twelve) hours for 5 doses.   pantoprazole 40 MG tablet Commonly known as: PROTONIX Take 1 tablet (40 mg total) by mouth 2 (two) times daily.   PARoxetine  25 MG 24 hr tablet Commonly known as: PAXIL-CR Take 25 mg by mouth every morning.   Vitamin B Complex Tabs Take 1 tablet by mouth every morning.   VITAMIN D-3 PO Take 1 tablet by mouth every morning.               Durable Medical Equipment  (From admission, onward)           Start     Ordered   11/08/20 1635  For home use only DME Negative pressure wound device  Once       Question Answer Comment  Frequency of dressing change 2 times per week   Length of need 6 Months   Dressing type Foam   Amount of suction 125 mm/Hg   Pressure application Continuous pressure   Supplies 10 canisters and 15 dressings per month for duration of therapy      11/02/20 1636              Discharge Care Instructions  (From  admission, onward)           Start     Ordered   11/10/20 0000  Discharge wound care:       Comments: As above   11/10/20 1238           Allergies  Allergen Reactions   Codeine Nausea And Vomiting    Follow-up Information     [facility] Follow up.   Specialty: Home Health Services Why: For home health RN Contact information: [address] [place] [address] [phone]         Tonia Ghent, MD. Schedule an appointment as soon as possible for a visit in 2 week(s).   Specialty: Family Medicine Contact information: [place] [address] [phone]         Jabier Mutton, MD Follow up on 12/05/2020.   Specialty: Infectious Diseases Why: Follow-up at 3:30 PM. Contact information: [address] [place] [address] [phone]         [name], MD. Schedule an appointment as soon as possible for a visit in 2 week(s).   Specialty: Plastic Surgery Why: f/u in 2-3 weeks or as previously scheduled. Contact information: [address] [place] [address] [phone]                  The results of significant diagnostics from this hospitalization (including imaging, microbiology, ancillary and laboratory) are listed below for reference.    Significant Diagnostic Studies: MR FOOT RIGHT W WO CONTRAST  Result Date: 10/30/2020 CLINICAL DATA:  Chronic nonhealing right foot ulcer anterolaterally along the foot and ankle. EXAM: MRI OF THE RIGHT HINDFOOT WITHOUT AND WITH CONTRAST TECHNIQUE: Multiplanar, multisequence MR imaging of the ankle was performed. No intravenous contrast was administered. COMPARISON:  Radiographs 10/29/2020 and CT scan from 10/08/2020 FINDINGS: TENDONS Peroneal: Longitudinal tearing of the peroneus brevis in the vicinity of the lateral malleolus. Posteromedial: Distal tibialis posterior tenosynovitis and tendinopathy, correlate clinically in  assessing for tibialis posterior dysfunction. Anterior: Grossly unremarkable Achilles: Unremarkable Plantar Fascia: Unremarkable LIGAMENTS Lateral: Unremarkable Medial: Unremarkable CARTILAGE Ankle Joint: No tibiotalar joint effusion or specific lesion along the plafond or talar dome. Subtalar Joints/Sinus Tarsi: Effusion of the posterior subtalar joint extending posteriorly and into the sinus tarsi. No substantial marrow edema along the joint to suggest septic arthritis. Bones: No significant degree of marrow edema or enhancement  indicate osteomyelitis involving the hindfoot, midfoot, or proximal metatarsals. Other: Very large necrotic ulceration of the anterolateral distal calf, ankle, and dorsal foot. Subtraction images in series 100 demonstrate the large irregular region of nonenhancing tissue in fluid along the margin of the extensive ulceration, with necrotic tissue extending in a flap like manner anterior to the distal tibia for example on image 1 of series 100. The proximal extent of the necrotic ulceration is not completely included, nor is the distal extent, with nonenhancing tissues extending least as far as the MTP joints dorsally, for example on image 14 series 12. Along the margins of the large necrotic region there is a rind of enhancement favoring granulation tissue. IMPRESSION: 1. Very large necrotic ulceration involving the lower calf anterolaterally, the lateral ankle, and extending into the lateral dorsal forefoot at least as far as the MTP joints. Subtraction series 100 depicts this large region of necrosis as nonenhancing tissue bordered by enhancing granulation tissue. 2. No compelling findings of active osteomyelitis involving the hindfoot, midfoot, or proximal metatarsals. I do not see a specific drainable contained abscess separate from the large ulceration. 3. Longitudinal tearing of the peroneus brevis tendon adjacent to the lateral malleolus. 4. Distal tibialis posterior tenosynovitis  and tendinopathy. 5. Small effusion of the posterior subtalar joint, without marrow edema along the bony surfaces to further indicate septic arthritis. Electronically Signed   By: Van Clines M.D.   On: 10/30/2020 09:26   DG Foot Complete Right  Result Date: 10/29/2020 CLINICAL DATA:  Right foot wound for 1 month. EXAM: RIGHT FOOT COMPLETE - 3+ VIEW COMPARISON:  Right foot x-ray 10/07/2020. FINDINGS: There is soft tissue swelling of the entire foot. This is most significant along the dorsal aspect of the foot and ankle where there is soft tissue gas. There is no radiopaque foreign body. There is no acute fracture or dislocation. There is no periosteal reaction or cortical erosion identified. The joint spaces are maintained. IMPRESSION: 1. Soft tissue swelling and air overlying the dorsal aspect of the foot and ankle worrisome for infection. Abscess or necrotizing infection cannot be excluded. 2. No acute bony abnormality. Electronically Signed   By: Ronney Asters M.D.   On: 10/29/2020 15:21   VAS [place] ABI WITH/WO TBI  Result Date: 10/31/2020  LOWER EXTREMITY DOPPLER STUDY Patient Name:  DAMILOLA FLAMM  Date of Exam:   10/31/2020 Medical Rec #: [id number]          Accession #:    [id number] Date of Birth: 1940/03/02          Patient Gender: F Patient Age:   30 years Exam Location:  [facility] Procedure:      VAS [place] ABI WITH/WO TBI Referring Phys: Nyoka Lint DOUTOVA --------------------------------------------------------------------------------  Indications: Ulceration. High Risk Factors: None.  Limitations: Today's exam was limited due to an open wound and bandages. Comparison Study: No prior studies. Performing Technologist: Carlos Levering RVT  Examination Guidelines: A complete evaluation includes at minimum, Doppler waveform signals and systolic blood pressure reading at the level of bilateral brachial, anterior tibial, and posterior tibial arteries, when vessel segments are accessible.  Bilateral testing is considered an integral part of a complete examination. Photoelectric Plethysmograph (PPG) waveforms and toe systolic pressure readings are included as required and additional duplex testing as needed. Limited examinations for reoccurring indications may be performed as noted.  ABI Findings: +---------+------------------+-----+---------+--------+ Right    Rt Pressure (mmHg)IndexWaveform Comment  +---------+------------------+-----+---------+--------+ Brachial 140  triphasic         +---------+------------------+-----+---------+--------+ PTA      194               1.39 biphasic          +---------+------------------+-----+---------+--------+ DP       193               1.38 biphasic          +---------+------------------+-----+---------+--------+ Great Toe84                0.60                   +---------+------------------+-----+---------+--------+ +---------+------------------+-----+---------+-------+ Left     Lt Pressure (mmHg)IndexWaveform Comment +---------+------------------+-----+---------+-------+ Brachial 135                    triphasic        +---------+------------------+-----+---------+-------+ PTA      192               1.37 triphasic        +---------+------------------+-----+---------+-------+ DP       170               1.21 triphasic        +---------+------------------+-----+---------+-------+ Great Toe116               0.83                  +---------+------------------+-----+---------+-------+ +-------+-----------+-----------+------------+------------+ ABI/TBIToday's ABIToday's TBIPrevious ABIPrevious TBI +-------+-----------+-----------+------------+------------+ Right  1.39       0.6                                 +-------+-----------+-----------+------------+------------+ Left   1.37       0.83                                 +-------+-----------+-----------+------------+------------+  Summary: Right: Resting right ankle-brachial index indicates noncompressible right lower extremity arteries. The right toe-brachial index is abnormal. Left: Resting left ankle-brachial index indicates noncompressible left lower extremity arteries. The left toe-brachial index is normal.  *See table(s) above for measurements and observations.  Electronically signed by Servando Snare MD on 10/31/2020 at 4:43:39 PM.    Final    ECHOCARDIOGRAM COMPLETE  Result Date: 10/16/2020    ECHOCARDIOGRAM REPORT   Patient Name:   JAYSHA LASURE Date of Exam: 10/16/2020 Medical Rec #:  [id number]         Height:       63.0 in Accession #:    [id number]        Weight:       155.4 lb Date of Birth:  1940/12/04         BSA:          1.737 m Patient Age:    24 years          BP:           115/65 mmHg Patient Gender: F                 HR:           93 bpm. Exam Location:  Inpatient Procedure: 2D Echo, Cardiac Doppler and Color Doppler Indications:    Other abnormalities of the heart  PSVT  History:        Patient has no prior history of Echocardiogram examinations.                 Arrythmias:PSVT; Risk Factors:Dyslipidemia.  Sonographer:    Wenda Low Referring Phys: [place]  1. Left ventricular ejection fraction, by estimation, is 60 to 65%. The left ventricle has normal function. The left ventricle has no regional wall motion abnormalities. Left ventricular diastolic parameters were normal.  2. Right ventricular systolic function is normal. The right ventricular size is normal. There is normal pulmonary artery systolic pressure.  3. The mitral valve is normal in structure. Mild mitral valve regurgitation. No evidence of mitral stenosis.  4. Tricuspid valve regurgitation is mild to moderate.  5. The aortic valve is normal in structure. Aortic valve regurgitation is trivial. No aortic stenosis is present.  6. The inferior vena cava  is normal in size with greater than 50% respiratory variability, suggesting right atrial pressure of 3 mmHg. FINDINGS  Left Ventricle: Left ventricular ejection fraction, by estimation, is 60 to 65%. The left ventricle has normal function. The left ventricle has no regional wall motion abnormalities. The left ventricular internal cavity size was normal in size. There is  no left ventricular hypertrophy. Left ventricular diastolic parameters were normal. Right Ventricle: The right ventricular size is normal. No increase in right ventricular wall thickness. Right ventricular systolic function is normal. There is normal pulmonary artery systolic pressure. The tricuspid regurgitant velocity is 2.76 m/s, and  with an assumed right atrial pressure of 3 mmHg, the estimated right ventricular systolic pressure is 86.7 mmHg. Left Atrium: Left atrial size was normal in size. Right Atrium: Right atrial size was normal in size. Pericardium: There is no evidence of pericardial effusion. Mitral Valve: The mitral valve is normal in structure. Mild mitral valve regurgitation, with centrally-directed jet. No evidence of mitral valve stenosis. MV peak gradient, 4.8 mmHg. The mean mitral valve gradient is 2.0 mmHg. Tricuspid Valve: The tricuspid valve is normal in structure. Tricuspid valve regurgitation is mild to moderate. No evidence of tricuspid stenosis. Aortic Valve: The aortic valve is normal in structure. Aortic valve regurgitation is trivial. Aortic regurgitation PHT measures 438 msec. No aortic stenosis is present. Aortic valve mean gradient measures 5.0 mmHg. Aortic valve peak gradient measures 9.9  mmHg. Aortic valve area, by VTI measures 2.37 cm. Pulmonic Valve: The pulmonic valve was normal in structure. Pulmonic valve regurgitation is not visualized. No evidence of pulmonic stenosis. Aorta: The aortic root is normal in size and structure. Venous: The inferior vena cava is normal in size with greater than 50% respiratory  variability, suggesting right atrial pressure of 3 mmHg. IAS/Shunts: No atrial level shunt detected by color flow Doppler.  LEFT VENTRICLE PLAX 2D LVIDd:         4.80 cm     Diastology LVIDs:         3.00 cm     LV e' medial:    8.49 cm/s LV PW:         0.90 cm     LV E/e' medial:  11.4 LV IVS:        1.00 cm     LV e' lateral:   9.68 cm/s LVOT diam:     2.00 cm     LV E/e' lateral: 10.0 LV SV:         74 LV SV Index:   43 LVOT Area:  3.14 cm  LV Volumes (MOD) LV vol d, MOD A2C: 70.3 ml LV vol d, MOD A4C: 69.7 ml LV vol s, MOD A2C: 29.6 ml LV vol s, MOD A4C: 26.5 ml LV SV MOD A2C:     40.7 ml LV SV MOD A4C:     69.7 ml LV SV MOD BP:      43.4 ml RIGHT VENTRICLE RV Basal diam:  3.00 cm RV Mid diam:    2.70 cm RV S prime:     15.60 cm/s TAPSE (M-mode): 2.4 cm LEFT ATRIUM             Index       RIGHT ATRIUM           Index LA diam:        3.70 cm 2.13 cm/m  RA Area:     11.90 cm LA Vol (A2C):   52.1 ml 29.99 ml/m RA Volume:   23.40 ml  13.47 ml/m LA Vol (A4C):   41.6 ml 23.95 ml/m LA Biplane Vol: 47.1 ml 27.11 ml/m  AORTIC VALVE AV Area (Vmax):    2.36 cm AV Area (Vmean):   2.28 cm AV Area (VTI):     2.37 cm AV Vmax:           157.00 cm/s AV Vmean:          106.000 cm/s AV VTI:            0.314 m AV Peak Grad:      9.9 mmHg AV Mean Grad:      5.0 mmHg LVOT Vmax:         118.00 cm/s LVOT Vmean:        76.900 cm/s LVOT VTI:          0.237 m LVOT/AV VTI ratio: 0.75 AI PHT:            438 msec  AORTA Ao Root diam: 3.20 cm MITRAL VALVE               TRICUSPID VALVE MV Area (PHT): 4.04 cm    TR Peak grad:   30.5 mmHg MV Area VTI:   2.36 cm    TR Vmax:        276.00 cm/s MV Peak grad:  4.8 mmHg MV Mean grad:  2.0 mmHg    SHUNTS MV Vmax:       1.10 m/s    Systemic VTI:  0.24 m MV Vmean:      67.6 cm/s   Systemic Diam: 2.00 cm MV Decel Time: 188 msec MV E velocity: 96.80 cm/s MV A velocity: 96.00 cm/s MV E/A ratio:  1.01 Mihai Croitoru MD Electronically signed by Sanda Klein MD Signature Date/Time:  10/16/2020/4:27:00 PM    Final     Microbiology: Recent Results (from the past 240 hour(s))  Fungus Culture With Stain     Status: None (Preliminary result)   Collection Time: 11/01/20  2:14 PM   Specimen: PATH Soft tissue  Result Value Ref Range Status   Fungus Stain Final report  Final    Comment: (NOTE) Performed At: [facility] [address] [place], [place] [id number] Rush Farmer MD [id number]    Fungus (Mycology) Culture PENDING  Incomplete   Fungal Source TISSUE  Final    Comment: RIGHT FOOT Performed at [facility] [address] [place], [place] [address]   Aerobic/Anaerobic Culture w Gram Stain (surgical/deep wound)     Status: None  Collection Time: 11/01/20  2:14 PM   Specimen: PATH Soft tissue  Result Value Ref Range Status   Specimen Description   Final    TISSUE RIGHT FOOT Performed at [facility] [address], [place], [place] [address]    Special Requests   Final    NONE Performed at [facility], [place] [address], [place], [place] [address]    Gram Stain   Final    ABUNDANT WBC PRESENT, PREDOMINANTLY PMN RARE GRAM POSITIVE COCCI IN PAIRS FEW GRAM NEGATIVE RODS    Culture   Final    ABUNDANT STENOTROPHOMONAS MALTOPHILIA FEW METHICILLIN RESISTANT STAPHYLOCOCCUS AUREUS NO ANAEROBES ISOLATED Performed at [facility], [place] [address], [place], [place] [address]    Report Status 11/07/2020 FINAL  Final   Organism ID, Bacteria STENOTROPHOMONAS MALTOPHILIA  Final   Organism ID, Bacteria METHICILLIN RESISTANT STAPHYLOCOCCUS AUREUS  Final      Susceptibility   Methicillin resistant staphylococcus aureus - MIC*    CIPROFLOXACIN >=8 RESISTANT Resistant     ERYTHROMYCIN >=8 RESISTANT Resistant     GENTAMICIN <=0.5 SENSITIVE Sensitive     OXACILLIN >=4 RESISTANT Resistant     TETRACYCLINE <=1 SENSITIVE Sensitive     VANCOMYCIN <=0.5 SENSITIVE Sensitive     TRIMETH/SULFA >=320  RESISTANT Resistant     CLINDAMYCIN <=0.25 SENSITIVE Sensitive     RIFAMPIN <=0.5 SENSITIVE Sensitive     Inducible Clindamycin NEGATIVE Sensitive     * FEW METHICILLIN RESISTANT STAPHYLOCOCCUS AUREUS   Stenotrophomonas maltophilia - MIC*    LEVOFLOXACIN 0.5 SENSITIVE Sensitive     TRIMETH/SULFA <=20 SENSITIVE Sensitive     * ABUNDANT STENOTROPHOMONAS MALTOPHILIA  Acid Fast Smear (AFB)     Status: None   Collection Time: 11/01/20  2:14 PM   Specimen: PATH Soft tissue  Result Value Ref Range Status   AFB Specimen Processing Comment  Final    Comment: Tissue Grinding and Digestion/Decontamination   Acid Fast Smear Negative  Final    Comment: (NOTE) Performed At: [facility] [employer] [place], [place] [id number] Rush Farmer MD OZ:[id number]    Source (AFB) TISSUE  Final    Comment: RIGHT FOOT Performed at [facility], [place] [address], [place], [place] [address]   Fungus Culture Result     Status: None   Collection Time: 11/01/20  2:14 PM  Result Value Ref Range Status   Result 1 Comment  Final    Comment: (NOTE) KOH/Calcofluor preparation:  no fungus observed. Performed At: [facility] [facility] [place], [place] [id number] Rush Farmer MD RJ:[id number]   Surgical pcr screen     Status: None   Collection Time: 11/07/20  9:20 PM   Specimen: Nasal Mucosa; Nasal Swab  Result Value Ref Range Status   MRSA, PCR NEGATIVE NEGATIVE Final   Staphylococcus aureus NEGATIVE NEGATIVE Final    Comment: (NOTE) The Xpert SA Assay (FDA approved for NASAL specimens in patients 17 years of age and older), is one component of a comprehensive surveillance program. It is not intended to diagnose infection nor to guide or monitor treatment. Performed at [facility], [place] [address] [place], [place] [address]      Labs: Basic Metabolic Panel: Recent Labs  Lab 11/06/20 1551 11/10/20 0510  NA 135 141  K 4.6 4.0  CL  101 111  CO2 26 26  GLUCOSE 105* 102*  BUN 21 17  CREATININE 0.83 0.57  CALCIUM 8.8* 7.8*   Liver Function Tests: No results for input(s): AST, ALT, ALKPHOS, BILITOT, PROT, ALBUMIN in the last 168 hours. No results for input(s): LIPASE, AMYLASE in the last 168 hours. No results for input(s): AMMONIA in the last 168 hours. CBC: Recent Labs  Lab 11/06/20 1551 11/09/20 0656 11/10/20 0510  WBC 6.6 6.5 5.6  NEUTROABS 4.3  --  3.4  HGB 9.6* 8.5* 8.1*  HCT 30.5* 27.4* 26.5*  MCV 95.3 96.1 98.5  PLT 452* 338 311   Cardiac Enzymes: No results for input(s): CKTOTAL, CKMB, CKMBINDEX, TROPONINI in the last 168 hours. BNP: BNP (last 3 results) No results for input(s): BNP in the last 8760 hours.  ProBNP (last 3 results) No results for input(s): PROBNP in the last 8760 hours.  CBG: No results for input(s): GLUCAP in the last 168 hours.     Signed:  Irine Seal MD.  [facility] 11/10/2020, 12:41 PM

## 2020-11-10 NOTE — TOC Transition Note (Signed)
Transition of Care [facility]) - CM/SW Discharge Note   Patient Details  Name: [name] Price MRN: [id number] Date of Birth: 1940/03/11  Transition of Care [facility]) CM/SW Contact:  Lynnell Catalan, RN Phone Number: 11/10/2020, 1:06 PM   Clinical Narrative:     Home wound Vac was delivered to pt room on 9/26. Nursing staff to switch pt over to home vac system prior to dc.  Home health orders received and [facility] alerted of new orders.   Final next level of care: [place] Barriers to Discharge: Continued Medical Work up     Discharge Plan and Services   Discharge Planning Services: CM Consult            DME Arranged: Vac DME Agency: [employer] Date DME Agency Contacted: 11/07/20 Time DME Agency Contacted: [phone] Representative spoke with at DME Agency: Leontine Locket HH Arranged: RN [place] Agency: [facility]        Social Determinants of Health ([place]) Interventions     Readmission Risk Interventions Readmission Risk Prevention Plan 11/07/2020  Transportation Screening Complete  PCP or Specialist Appt within 5-7 Days Complete  Home Care Screening Complete  Medication Review (RN CM) Complete  Some recent data might be hidden

## 2020-11-10 NOTE — Evaluation (Signed)
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 S/p debridement 9/20 and additional debridement with wound vac placement 9/27. PMH includes breast CA and bilateral THA   Clinical Impression   Patient seen for re-eval due to s/p second R foot debridement now with wound vac, cam walker and per plastics note heel weight bearing with limited ambulation. Educated patient on these precautions, verbalize understanding. Assisted patient with donning cam walker as this is first time someone has put it on, verbalizes understanding of how to don/doff. Patient continues to be mod I with bed mobility and does not need any physical assistance for functional transfers using rolling walker for increased safety. Educate patient that MD wants patient to minimize ambulation for wound heeling and to keep to shorter house hold distances, patient verbalize understanding stating her bathroom is very close to her bedroom and rest of her house is not that spread out. Patient does not have any concerns upon returning home regarding her self care, primarily is wanting to make sure [facility] RN for wound care is set up "we had some miscommunications with that last time." Acute OT to sign off.      Recommendations for follow up therapy are one component of a multi-disciplinary discharge planning process, led by the attending physician.  Recommendations may be updated based on patient status, additional functional criteria and insurance authorization.   Follow Up Recommendations  No OT follow up;Other (comment) (patient main concern is having [place] RN/wound care set up at D/C)    Equipment Recommendations  None recommended by OT       Precautions / Restrictions Precautions Precaution Comments: wound  vac Required Braces or Orthoses: Other Brace Other Brace: cam walker Restrictions Weight Bearing Restrictions: Yes Other Position/Activity Restrictions: per plastics note 9/27 is fine to heel touch ambulate, but limit ambulation to prevent shearing of the wound matrix.      Mobility Bed Mobility Overal bed mobility: Modified Independent                  Transfers Overall transfer level: Modified independent Equipment used: Rolling walker (2 wheeled) Transfers: Sit to/from [facility] Sit to Stand: Modified independent (Device/Increase time) Stand pivot transfers: Modified independent (Device/Increase time)       General transfer comment: No assistance needed to power up to standing from edge of bed, able to manage walker safely to tranasfer to recliner chair.    Balance Overall balance assessment: Modified Independent                                         ADL either performed or assessed with clinical judgement   ADL Overall ADL's : Modified independent                                       General ADL Comments: Patient does not need any physical assistance with bed mobility, transferring few steps to recliner chair with rolling walker, has been getting up to use bedside commode as needed. Assisted patient with donning cam walker as this is first time she has put it on, verbalizes good understanding.  Educate patient to utilize walker due to cam walker + heel weight bearing to maximize safety with transfers/household ambulation and patient verbalize understanding.      Pertinent Vitals/Pain Pain Assessment: 0-10 Pain Score: 3  Pain Location: R foot Pain Descriptors / Indicators: Discomfort Pain Intervention(s): Monitored during session     Hand Dominance Right   Extremity/Trunk Assessment Upper Extremity Assessment Upper Extremity Assessment: Overall WFL for tasks assessed   Lower Extremity Assessment Lower  Extremity Assessment: Defer to PT evaluation       Communication Communication Communication: No difficulties   Cognition Arousal/Alertness: Awake/alert Behavior During Therapy: WFL for tasks assessed/performed Overall Cognitive Status: Within Functional Limits for tasks assessed                                                Home Living Family/patient expects to be discharged to:: Private residence Living Arrangements: Spouse/significant other Available Help at Discharge: Family Type of Home: House Home Access: Ramped entrance Entrance Stairs-Number of Steps: 1 Entrance Stairs-Rails: None Home Layout: One level     Bathroom Shower/Tub: Occupational psychologist: Handicapped height     Home Equipment: Grab bars - tub/shower;Shower seat;Walker - 2 wheels          Prior Functioning/Environment Level of Independence: Independent with assistive device(s)        Comments: Had been using RW secondary to pain; drives; I with ADLs/IADLs        OT Problem List: Pain         OT Goals(Current goals can be found in the care plan section) Acute Rehab OT Goals Patient Stated Goal: "have help with wound management at home" OT Goal Formulation: All assessment and education complete, DC therapy   AM-PAC OT "6 Clicks" Daily Activity     Outcome Measure Help from another person eating meals?: None Help from another person taking care of personal grooming?: None Help from another person toileting, which includes using toliet, bedpan, or urinal?: None Help from another person bathing (including washing, rinsing, drying)?: None Help from another person to put on and taking off regular upper body clothing?: None Help from another person to put on and taking off regular lower body clothing?: A Little 6 Click Score: 23   End of Session Equipment Utilized During Treatment: Rolling walker Nurse Communication: Mobility status  Activity Tolerance: Patient  tolerated treatment well Patient left: in chair;with call bell/phone within reach  OT Visit Diagnosis: Pain Pain - Right/Left: Right Pain - part of body: Ankle and joints of foot                Time: 9678-9381 OT Time Calculation (min): 20 min Charges:  OT General Charges $OT Visit: 1 Visit OT Evaluation $OT Re-eval: 1 Re-eval  Delbert Phenix OT OT pager: [phone]  Rosemary Holms 11/10/2020, 9:33 AM

## 2020-11-10 NOTE — Progress Notes (Signed)
PT Cancellation Note  Patient Details Name: [name] Price MRN: [id number] DOB: 01/10/1941   Cancelled Treatment:    Reason Eval/Treat Not Completed: PT screened, no needs identified, will sign off (Patient seen by OT this AM and remains at Mod I level for transfers with RW. pt able to verablize precautions for foot wound to weight bear through heel only and remain in CAM walker boot. Pt feels she is at normal mobility level and Mod I with RW.) Educated on limiting ambulation distance to allow wound matrix to heel. Pt verbalized understanding and all questions addressed. Acute PT will sign off at this time.   Verner Mould, DPT Acute Rehabilitation Services Office [phone] Pager [phone]

## 2020-11-10 NOTE — Consult Note (Signed)
[facility] Inpatient Consult   11/10/2020  [name] Price 06-Jul-1940 [id number]  [place] [facility] [ACO]    Patient was screened for [place] [facility] CM) services due to unplanned 30 day readmission and high risk score for readmission. Patient's primary provider office has embedded chronic care management team which offers services for post hospital transition of care needs.   Plan: Will make referral for CCM follow up with embedded care management team at [facility].   Of note, [facility] Care Management services does not replace or interfere with any services that are arranged by inpatient case management or social work.   Netta Cedars, MSN, [facility] Liaison Nurse Mobile Phone [phone]  Toll free office [phone]

## 2020-11-10 NOTE — Progress Notes (Signed)
2 Days Post-Op  Subjective:  Today, patient is doing well and is excited to hopefully be discharged home soon.  She denies any fevers, new or worsening pain symptoms, streaking, or wound VAC dysfunction.   Objective: Vital signs in last 24 hours: Temp:  [98.2 F (36.8 C)-98.5 F (36.9 C)] 98.2 F (36.8 C) (09/29 0433) Pulse Rate:  [85-96] 89 (09/29 0433) Resp:  [18-20] 19 (09/29 0433) BP: (90-101)/(52-54) 101/52 (09/29 0433) SpO2:  [95 %-98 %] 95 % (09/29 0433) Last BM Date: 11/10/20  Intake/Output from previous day: 09/28 0701 - 09/29 0700 In: 3093.5 [P.O.:1440; I.V.:1653.5] Out: -  Intake/Output this shift: Total I/O In: 120 [P.O.:120] Out: -   General appearance: alert, cooperative, and well-appearing Resp: No increased work of breathing Extremities: Right lower extremity: Cam walker boot up to knee.  Toes are visualized, wound VAC dressing intact.  Good seal noted.  Distal sensation intact.  No erythema or swelling above boot.  Lab Results:  CBC Latest Ref Rng & Units 11/10/2020 11/09/2020 11/06/2020  WBC 4.0 - 10.5 K/uL 5.6 6.5 6.6  Hemoglobin 12.0 - 15.0 g/dL 8.1(L) 8.5(L) 9.6(L)  Hematocrit 36.0 - 46.0 % 26.5(L) 27.4(L) 30.5(L)  Platelets 150 - 400 K/uL 311 338 452(H)    BMET Recent Labs    11/10/20 0510  NA 141  K 4.0  CL 111  CO2 26  GLUCOSE 102*  BUN 17  CREATININE 0.57  CALCIUM 7.8*   PT/INR No results for input(s): LABPROT, INR in the last 72 hours. ABG No results for input(s): PHART, HCO3 in the last 72 hours.  Invalid input(s): PCO2, PO2  Studies/Results: No results found.  Anti-infectives: Anti-infectives (From admission, onward)    Start     Dose/Rate Route Frequency Ordered Stop   11/08/20 1139  ceFAZolin (ANCEF) IVPB 2g/100 mL premix        2 g 200 mL/hr over 30 Minutes Intravenous On call to O.R. 11/08/20 1133 11/08/20 1602   11/05/20 1445  linezolid (ZYVOX) tablet 600 mg        600 mg Oral Every 12 hours 11/05/20 1354 11/12/20 2359    11/05/20 1445  cefdinir (OMNICEF) capsule 300 mg        300 mg Oral Every 12 hours 11/05/20 1354 11/12/20 2359   11/04/20 2200  sulfamethoxazole-trimethoprim (BACTRIM DS) 800-160 MG per tablet 1 tablet  Status:  Discontinued        1 tablet Oral Every 12 hours 11/04/20 1408 11/05/20 1353   11/02/20 2200  sulfamethoxazole-trimethoprim (BACTRIM DS) 800-160 MG per tablet 2 tablet  Status:  Discontinued        2 tablet Oral Every 12 hours 11/02/20 2010 11/04/20 1408   11/02/20 0600  ceFAZolin (ANCEF) IVPB 2g/100 mL premix        2 g 200 mL/hr over 30 Minutes Intravenous On call to O.R. 11/01/20 1205 11/01/20 1410   11/01/20 2245  imipenem-cilastatin (PRIMAXIN) 500 mg in sodium chloride 0.9 % 100 mL IVPB  Status:  Discontinued        500 mg 200 mL/hr over 30 Minutes Intravenous Every 8 hours 11/01/20 2156 11/02/20 2010   11/01/20 2230  linezolid (ZYVOX) tablet 600 mg  Status:  Discontinued        600 mg Oral Every 12 hours 11/01/20 2143 11/02/20 2010   10/31/20 0500  vancomycin (VANCOCIN) IVPB 1000 mg/200 mL premix  Status:  Discontinued        1,000 mg 200 mL/hr over 60 Minutes  Intravenous Every 24 hours 10/30/20 1057 10/31/20 1108   10/30/20 0500  vancomycin (VANCOREADY) IVPB 1250 mg/250 mL  Status:  Discontinued        1,250 mg 166.7 mL/hr over 90 Minutes Intravenous Every 24 hours 10/29/20 2344 10/30/20 1057   10/30/20 0400  ceFEPIme (MAXIPIME) 2 g in sodium chloride 0.9 % 100 mL IVPB  Status:  Discontinued        2 g 200 mL/hr over 30 Minutes Intravenous Every 12 hours 10/29/20 2344 10/31/20 1108   10/29/20 1630  vancomycin (VANCOCIN) IVPB 1000 mg/200 mL premix        1,000 mg 200 mL/hr over 60 Minutes Intravenous  Once 10/29/20 1614 10/29/20 1911   10/29/20 1615  ceFEPIme (MAXIPIME) 2 g in sodium chloride 0.9 % 100 mL IVPB        2 g 200 mL/hr over 30 Minutes Intravenous  Once 10/29/20 1614 10/29/20 1911       Assessment/Plan: s/p Procedure(s): Debridement of r. foot  wound APPLICATION OF WOUND VAC APPLICATION OF SKIN SUBSTITUTE  Patient is s/p debridement of right foot wound with application of Integra wound matrix as well as wound VAC.  Wound VAC is intact, functioning well.  Normal-appearing drainage in tube.  Reminded patient that the wound VAC sponges held in place by staples.  Orders have been placed with transition to care team.  Her personal at home wound VAC will need to be there with her prior to her discharge.  Home health nurse agency also has to be established that she will require wound VAC changes twice weekly, ideally on Fridays and Mondays or Tuesdays.  She will need to leave her current wound VAC dressing intact until she can follow-up with our office next week.  Encouraged to keep cam boot in place at all times and ambulate by bearing weight on heel exclusively.  Sponge bathing preferred.  If wound VAC seal was compromised, recommending that she apply Adaptic over wound matrix covered by 4 x 4 gauze, ABD pads, Kerlix, and Ace wrap from distal foot to just below the knee.    Stable appearing and reasonable for discharge per our team once [facility] is arranged and at home wound VAC is provided.  Discharge antibiotics per primary team.  She understands to call the clinic should she develop any questions or concerns.      LOS: 12 days    Krista Blue, PA-C 11/10/2020

## 2020-11-11 DIAGNOSIS — A419 Sepsis, unspecified organism: Secondary | ICD-10-CM | POA: Diagnosis not present

## 2020-11-11 DIAGNOSIS — L03115 Cellulitis of right lower limb: Secondary | ICD-10-CM | POA: Diagnosis not present

## 2020-11-11 DIAGNOSIS — E876 Hypokalemia: Secondary | ICD-10-CM | POA: Diagnosis not present

## 2020-11-11 DIAGNOSIS — I10 Essential (primary) hypertension: Secondary | ICD-10-CM | POA: Diagnosis not present

## 2020-11-11 DIAGNOSIS — D649 Anemia, unspecified: Secondary | ICD-10-CM | POA: Diagnosis not present

## 2020-11-11 DIAGNOSIS — M94261 Chondromalacia, right knee: Secondary | ICD-10-CM | POA: Diagnosis not present

## 2020-11-12 DIAGNOSIS — A419 Sepsis, unspecified organism: Secondary | ICD-10-CM | POA: Diagnosis not present

## 2020-11-12 DIAGNOSIS — E876 Hypokalemia: Secondary | ICD-10-CM | POA: Diagnosis not present

## 2020-11-12 DIAGNOSIS — M94261 Chondromalacia, right knee: Secondary | ICD-10-CM | POA: Diagnosis not present

## 2020-11-12 DIAGNOSIS — I10 Essential (primary) hypertension: Secondary | ICD-10-CM | POA: Diagnosis not present

## 2020-11-12 DIAGNOSIS — D649 Anemia, unspecified: Secondary | ICD-10-CM | POA: Diagnosis not present

## 2020-11-12 DIAGNOSIS — L03115 Cellulitis of right lower limb: Secondary | ICD-10-CM | POA: Diagnosis not present

## 2020-11-14 ENCOUNTER — Telehealth: Payer: Self-pay | Admitting: Family Medicine

## 2020-11-14 ENCOUNTER — Telehealth: Payer: Self-pay | Admitting: *Deleted

## 2020-11-14 DIAGNOSIS — I10 Essential (primary) hypertension: Secondary | ICD-10-CM | POA: Diagnosis not present

## 2020-11-14 DIAGNOSIS — D649 Anemia, unspecified: Secondary | ICD-10-CM | POA: Diagnosis not present

## 2020-11-14 DIAGNOSIS — A419 Sepsis, unspecified organism: Secondary | ICD-10-CM | POA: Diagnosis not present

## 2020-11-14 DIAGNOSIS — M94261 Chondromalacia, right knee: Secondary | ICD-10-CM | POA: Diagnosis not present

## 2020-11-14 DIAGNOSIS — E876 Hypokalemia: Secondary | ICD-10-CM | POA: Diagnosis not present

## 2020-11-14 DIAGNOSIS — L03115 Cellulitis of right lower limb: Secondary | ICD-10-CM | POA: Diagnosis not present

## 2020-11-14 NOTE — Telephone Encounter (Signed)
Please give the order.  Thanks.   

## 2020-11-14 NOTE — Telephone Encounter (Signed)
Home Health verbal orders Caller Name: Mercy Moore [facility]  [place] number: [phone]  Requesting OT/PT/Skilled nursing/Social Work/Speech: PT  Reason: discharged from hospital and infection on her leg  Frequency: 1W5  Please forward to [facility] pool or providers CMA

## 2020-11-14 NOTE — Chronic Care Management (AMB) (Signed)
  Chronic Care Management   Note  11/14/2020 Name: TERRANCE LANAHAN MRN: [id number] DOB: 09-29-1940  ALIANAH LOFTON is a 80 y.o. year old female who is a primary care patient of Tonia Ghent, MD. I reached out to Joella Prince by phone today in response to a referral sent by Ms. Cleophas Dunker Barringer's PCP.  Ms. Freedman was given information about Chronic Care Management services today including:  CCM service includes personalized support from designated clinical staff supervised by her physician, including individualized plan of care and coordination with other care providers 24/7 contact phone numbers for assistance for urgent and routine care needs. Service will only be billed when office clinical staff spend 20 minutes or more in a month to coordinate care. Only one practitioner may furnish and bill the service in a calendar month. The patient may stop CCM services at any time (effective at the end of the month) by phone call to the office staff. The patient is responsible for co-pay (up to 20% after annual deductible is met) if co-pay is required by the individual health plan.   Patient agreed to services and verbal consent obtained.   Follow up plan: Telephone appointment with care management team member scheduled for: 11/28/2020  Julian Hy, [place] [facility]  Direct Dial: [phone]

## 2020-11-15 NOTE — Telephone Encounter (Signed)
lmtcb

## 2020-11-15 NOTE — Telephone Encounter (Signed)
Verbal orders given  

## 2020-11-16 DIAGNOSIS — A419 Sepsis, unspecified organism: Secondary | ICD-10-CM | POA: Diagnosis not present

## 2020-11-16 DIAGNOSIS — E876 Hypokalemia: Secondary | ICD-10-CM | POA: Diagnosis not present

## 2020-11-16 DIAGNOSIS — M94261 Chondromalacia, right knee: Secondary | ICD-10-CM | POA: Diagnosis not present

## 2020-11-16 DIAGNOSIS — D649 Anemia, unspecified: Secondary | ICD-10-CM | POA: Diagnosis not present

## 2020-11-16 DIAGNOSIS — I10 Essential (primary) hypertension: Secondary | ICD-10-CM | POA: Diagnosis not present

## 2020-11-16 DIAGNOSIS — L03115 Cellulitis of right lower limb: Secondary | ICD-10-CM | POA: Diagnosis not present

## 2020-11-18 DIAGNOSIS — D649 Anemia, unspecified: Secondary | ICD-10-CM | POA: Diagnosis not present

## 2020-11-18 DIAGNOSIS — L03115 Cellulitis of right lower limb: Secondary | ICD-10-CM | POA: Diagnosis not present

## 2020-11-18 DIAGNOSIS — A419 Sepsis, unspecified organism: Secondary | ICD-10-CM | POA: Diagnosis not present

## 2020-11-18 DIAGNOSIS — E876 Hypokalemia: Secondary | ICD-10-CM | POA: Diagnosis not present

## 2020-11-18 DIAGNOSIS — I10 Essential (primary) hypertension: Secondary | ICD-10-CM | POA: Diagnosis not present

## 2020-11-18 DIAGNOSIS — M94261 Chondromalacia, right knee: Secondary | ICD-10-CM | POA: Diagnosis not present

## 2020-11-20 DIAGNOSIS — E876 Hypokalemia: Secondary | ICD-10-CM | POA: Diagnosis not present

## 2020-11-20 DIAGNOSIS — M810 Age-related osteoporosis without current pathological fracture: Secondary | ICD-10-CM | POA: Diagnosis not present

## 2020-11-20 DIAGNOSIS — H8110 Benign paroxysmal vertigo, unspecified ear: Secondary | ICD-10-CM | POA: Diagnosis not present

## 2020-11-20 DIAGNOSIS — K5730 Diverticulosis of large intestine without perforation or abscess without bleeding: Secondary | ICD-10-CM | POA: Diagnosis not present

## 2020-11-20 DIAGNOSIS — D649 Anemia, unspecified: Secondary | ICD-10-CM | POA: Diagnosis not present

## 2020-11-20 DIAGNOSIS — F32A Depression, unspecified: Secondary | ICD-10-CM | POA: Diagnosis not present

## 2020-11-20 DIAGNOSIS — A419 Sepsis, unspecified organism: Secondary | ICD-10-CM | POA: Diagnosis not present

## 2020-11-20 DIAGNOSIS — M94261 Chondromalacia, right knee: Secondary | ICD-10-CM | POA: Diagnosis not present

## 2020-11-20 DIAGNOSIS — I10 Essential (primary) hypertension: Secondary | ICD-10-CM | POA: Diagnosis not present

## 2020-11-20 DIAGNOSIS — E7800 Pure hypercholesterolemia, unspecified: Secondary | ICD-10-CM | POA: Diagnosis not present

## 2020-11-20 DIAGNOSIS — L03115 Cellulitis of right lower limb: Secondary | ICD-10-CM | POA: Diagnosis not present

## 2020-11-20 DIAGNOSIS — K219 Gastro-esophageal reflux disease without esophagitis: Secondary | ICD-10-CM | POA: Diagnosis not present

## 2020-11-20 DIAGNOSIS — Z853 Personal history of malignant neoplasm of breast: Secondary | ICD-10-CM | POA: Diagnosis not present

## 2020-11-21 DIAGNOSIS — F32A Depression, unspecified: Secondary | ICD-10-CM | POA: Diagnosis not present

## 2020-11-21 DIAGNOSIS — A419 Sepsis, unspecified organism: Secondary | ICD-10-CM | POA: Diagnosis not present

## 2020-11-21 DIAGNOSIS — K219 Gastro-esophageal reflux disease without esophagitis: Secondary | ICD-10-CM | POA: Diagnosis not present

## 2020-11-21 DIAGNOSIS — M94261 Chondromalacia, right knee: Secondary | ICD-10-CM | POA: Diagnosis not present

## 2020-11-21 DIAGNOSIS — L03115 Cellulitis of right lower limb: Secondary | ICD-10-CM | POA: Diagnosis not present

## 2020-11-21 DIAGNOSIS — I10 Essential (primary) hypertension: Secondary | ICD-10-CM | POA: Diagnosis not present

## 2020-11-22 ENCOUNTER — Ambulatory Visit: Payer: Medicare Other | Admitting: Internal Medicine

## 2020-11-22 DIAGNOSIS — K219 Gastro-esophageal reflux disease without esophagitis: Secondary | ICD-10-CM | POA: Diagnosis not present

## 2020-11-22 DIAGNOSIS — F32A Depression, unspecified: Secondary | ICD-10-CM | POA: Diagnosis not present

## 2020-11-22 DIAGNOSIS — L03115 Cellulitis of right lower limb: Secondary | ICD-10-CM | POA: Diagnosis not present

## 2020-11-22 DIAGNOSIS — A419 Sepsis, unspecified organism: Secondary | ICD-10-CM | POA: Diagnosis not present

## 2020-11-22 DIAGNOSIS — I10 Essential (primary) hypertension: Secondary | ICD-10-CM | POA: Diagnosis not present

## 2020-11-22 DIAGNOSIS — M94261 Chondromalacia, right knee: Secondary | ICD-10-CM | POA: Diagnosis not present

## 2020-11-23 ENCOUNTER — Encounter (HOSPITAL_COMMUNITY): Payer: Self-pay | Admitting: Radiology

## 2020-11-23 ENCOUNTER — Ambulatory Visit: Payer: Medicare Other | Admitting: Internal Medicine

## 2020-11-23 DIAGNOSIS — L03115 Cellulitis of right lower limb: Secondary | ICD-10-CM | POA: Diagnosis not present

## 2020-11-23 DIAGNOSIS — M94261 Chondromalacia, right knee: Secondary | ICD-10-CM | POA: Diagnosis not present

## 2020-11-23 DIAGNOSIS — A419 Sepsis, unspecified organism: Secondary | ICD-10-CM | POA: Diagnosis not present

## 2020-11-23 DIAGNOSIS — K219 Gastro-esophageal reflux disease without esophagitis: Secondary | ICD-10-CM | POA: Diagnosis not present

## 2020-11-23 DIAGNOSIS — F32A Depression, unspecified: Secondary | ICD-10-CM | POA: Diagnosis not present

## 2020-11-23 DIAGNOSIS — I10 Essential (primary) hypertension: Secondary | ICD-10-CM | POA: Diagnosis not present

## 2020-11-24 ENCOUNTER — Ambulatory Visit (INDEPENDENT_AMBULATORY_CARE_PROVIDER_SITE_OTHER): Payer: Medicare Other | Admitting: Plastic Surgery

## 2020-11-24 ENCOUNTER — Other Ambulatory Visit: Payer: Self-pay

## 2020-11-24 DIAGNOSIS — S91301D Unspecified open wound, right foot, subsequent encounter: Secondary | ICD-10-CM | POA: Diagnosis not present

## 2020-11-24 NOTE — Progress Notes (Signed)
Patient presents postop from debridement of right foot wound and Integra placement.  She has been getting wound VAC at home.  She feels good and strong overall.  She has been in a walking boot and has been heel touch weightbearing and using a walker sometimes as well.  On exam the VAC is in place with no real surrounding erythema.  Foot looks healthy.  Minimal drainage in the canister.  We will plan to have her come back in 2 weeks to do a definitive examination of the wound and what I would expect is that we would plan for a skin graft after that.  She is comfortable with this plan we will plan to see her at her next visit.

## 2020-11-28 ENCOUNTER — Ambulatory Visit (INDEPENDENT_AMBULATORY_CARE_PROVIDER_SITE_OTHER): Payer: Medicare Other

## 2020-11-28 ENCOUNTER — Encounter: Payer: Self-pay | Admitting: Family Medicine

## 2020-11-28 DIAGNOSIS — M94261 Chondromalacia, right knee: Secondary | ICD-10-CM | POA: Diagnosis not present

## 2020-11-28 DIAGNOSIS — F32A Depression, unspecified: Secondary | ICD-10-CM | POA: Diagnosis not present

## 2020-11-28 DIAGNOSIS — S91301A Unspecified open wound, right foot, initial encounter: Secondary | ICD-10-CM

## 2020-11-28 DIAGNOSIS — I10 Essential (primary) hypertension: Secondary | ICD-10-CM

## 2020-11-28 DIAGNOSIS — K219 Gastro-esophageal reflux disease without esophagitis: Secondary | ICD-10-CM | POA: Diagnosis not present

## 2020-11-28 DIAGNOSIS — L03115 Cellulitis of right lower limb: Secondary | ICD-10-CM | POA: Diagnosis not present

## 2020-11-28 DIAGNOSIS — A419 Sepsis, unspecified organism: Secondary | ICD-10-CM | POA: Diagnosis not present

## 2020-11-29 ENCOUNTER — Telehealth: Payer: Self-pay | Admitting: Family Medicine

## 2020-11-29 NOTE — Telephone Encounter (Signed)
I'll work on the papers.  Thanks.

## 2020-11-29 NOTE — Telephone Encounter (Signed)
Daughter cara came in and dropped off FMLA paperwork for her to care for her mom.

## 2020-11-29 NOTE — Patient Instructions (Signed)
Visit Information:  Thank you for taking the time to speak with me today.   PATIENT GOALS:   Goals Addressed             This Visit's Progress    Improved wound healing and management of chronic conditions.   On track    Timeframe:  Long-Range Goal Priority:  High Start Date:     11/29/2020                        Expected End Date:   04/11/2020                     Follow up:02/09/2021  Take your medications as prescribed and refill timely Attend all scheduled provider appointments Call provider office for new concerns or questions Review education articles sent to you in MyChart on High blood pressure management   Work with the home health agency in management of wound care/ vac.  Call if you have questions Avoid rubbing and scratching areas near/ around wound.  Rubbing and scratching can cause further injury and delay healing Eat a diet that promotes healing ( high protein, calorie diet)  Notify doctor for signs/ symptoms of infection ( increased wound drainage, increasing pain, local warmth and redness, fever)        Consent to CCM Services: Ms. Magouirk was given information about Chronic Care Management services including:  CCM service includes personalized support from designated clinical staff supervised by her physician, including individualized plan of care and coordination with other care providers 24/7 contact phone numbers for assistance for urgent and routine care needs. Service will only be billed when office clinical staff spend 20 minutes or more in a month to coordinate care. Only one practitioner may furnish and bill the service in a calendar month. The patient may stop CCM services at any time (effective at the end of the month) by phone call to the office staff. The patient will be responsible for cost sharing (co-pay) of up to 20% of the service fee (after annual deductible is met).  Patient agreed to services and verbal consent obtained.   Patient  verbalizes understanding of instructions provided today and agrees to view in [place].   The patient has been provided with contact information for the care management team and has been advised to call with any health related questions or concerns.  The care management team will reach out to the patient again over the next 1-2 months .   Quinn Plowman RN,BSN,CCM RN Case Manager [facility]  [phone]   CLINICAL CARE PLAN: Patient Care Plan: RN- Plan of care     Problem Identified: Development of care plan for management of impaired tissue/skin/ wound integrity in patient with chronic conditions ( HTN,)   Priority: High     Long-Range Goal: improved skin integrity and management of chronic conditions   Start Date: 11/28/2020  Expected End Date: 04/11/2021  This Visit's Progress: On track  Priority: High  Note:   Current Barriers:  Knowledge Deficits related to plan of care for management of HTN and skin integrity  RNCM Clinical Goal(s):  Patient will verbalize understanding of plan for management of HTN and skin integrity  through collaboration with RN Care manager, provider, and care team.   Interventions: 1:1 collaboration with primary care provider regarding development and update of comprehensive plan of care as evidenced by provider attestation and co-signature Inter-disciplinary care team collaboration (see longitudinal plan  of care) Evaluation of current treatment plan related to  self management and patient's adherence to plan as established by provider  Hypertension Interventions:  New Goal Last practice recorded BP readings:  BP Readings from Last 3 Encounters:  11/10/20 (!) 101/52  10/19/20 100/71  10/07/20 (!) 112/50  Most recent eGFR/CrCl: No results found for: EGFR  No components found for: CRCL  Evaluation of current treatment plan related for hypertension self management and patient's adherence to plan as established by provider; Reviewed  medications with patient and discussed importance of compliance; Discussed plans with patient for ongoing care management follow up and provided patient with direct contact information for care management team; Advised patient, providing education and rationale, to monitor blood pressure daily and record, calling PCP for findings outside established parameters;  Reviewed scheduled/upcoming provider appointments Advised patient to call provider office to scheduled annual wellness visit.  Per chart review last annual wellness visit was 06/26/2018.  Provided patient with education information through Mychart on Hypertension management   Skin Integrity New goal. Evaluation of current treatment plan related for  skin integrity  , self-management and patient's adherence to plan as established by provider. Discussed plans with patient for ongoing care management follow up and provided patient with direct contact information for care management team Provided education to patient re: nutrition that promotes healing Reviewed medications with patient and discussed importance of compliance Reviewed scheduled/upcoming provider appointments  Discussed signs/ symptoms of infection.  Advised to notify provider of symptoms as soon as possible.  Advised patient to continue to work with home health regarding wound care/ wound vac Advised to follow up with providers as recommended.   Patient Goals/Self-Care Activities: Take your medications as prescribed and refill timely Attend all scheduled provider appointments Call provider office for new concerns or questions Review education articles sent to you in [place] on High blood pressure management   Work with the home health agency in management of wound care/ vac.  Call if you have questions Avoid rubbing and scratching areas near/ around wound.  Rubbing and scratching can cause further injury and delay healing Eat a diet that promotes healing ( high protein,  calorie diet)  Notify doctor for signs/ symptoms of infection ( increased wound drainage, increasing pain, local warmth and redness, fever)

## 2020-11-29 NOTE — Telephone Encounter (Signed)
[name] Price came in and dropped off FMLA paperwork to care for her mom [name] Price)

## 2020-11-29 NOTE — Chronic Care Management (AMB) (Signed)
Chronic Care Management   CCM RN Visit Note  11/29/2020 Name: [name] Price MRN: [id number] DOB: 02-Aug-1940  Subjective: [name] Price is a 80 y.o. year old female who is a primary care patient of Tonia Ghent, MD. The care management team was consulted for assistance with disease management and care coordination needs.    Engaged with patient by telephone for initial visit in response to provider referral for case management and/or care coordination services.   Consent to Services:  The patient was given the following information about Chronic Care Management services today, agreed to services, and gave verbal consent: 1. CCM service includes personalized support from designated clinical staff supervised by the primary care provider, including individualized plan of care and coordination with other care providers 2. 24/7 contact phone numbers for assistance for urgent and routine care needs. 3. Service will only be billed when office clinical staff spend 20 minutes or more in a month to coordinate care. 4. Only one practitioner may furnish and bill the service in a calendar month. 5.The patient may stop CCM services at any time (effective at the end of the month) by phone call to the office staff. 6. The patient will be responsible for cost sharing (co-pay) of up to 20% of the service fee (after annual deductible is met). Patient agreed to services and consent obtained.  Patient agreed to services and verbal consent obtained.   Assessment: Review of patient past medical history, allergies, medications, health status, including review of consultants reports, laboratory and other test data, was performed as part of comprehensive evaluation and provision of chronic care management services.   SDOH (Social Determinants of Health) assessments and interventions performed:  SDOH Interventions    Flowsheet Row Most Recent Value  SDOH Interventions   Food Insecurity Interventions  Intervention Not Indicated  Housing Interventions Intervention Not Indicated  Transportation Interventions Intervention Not Indicated        CCM Care Plan  Allergies  Allergen Reactions   Codeine Nausea And Vomiting    Outpatient Encounter Medications as of 11/28/2020  Medication Sig   acetaminophen (TYLENOL) 500 MG tablet Take 500 mg by mouth daily as needed (pain).   B Complex Vitamins (VITAMIN B COMPLEX) TABS Take 1 tablet by mouth every morning.   Calcium Carbonate-Vitamin D (CALCIUM-D PO) Take 1 tablet by mouth 2 (two) times daily.   Cholecalciferol (VITAMIN D-3 PO) Take 1 tablet by mouth every morning.   ferrous gluconate (FERGON) 324 MG tablet Take 1 tablet (324 mg total) by mouth 2 (two) times daily with a meal.   pantoprazole (PROTONIX) 40 MG tablet Take 1 tablet (40 mg total) by mouth 2 (two) times daily.   PARoxetine (PAXIL-CR) 25 MG 24 hr tablet Take 25 mg by mouth every morning.   acidophilus (RISAQUAD) CAPS capsule Take 1 capsule by mouth daily. (Patient not taking: Reported on 11/28/2020)   feeding supplement (ENSURE SURGERY) LIQD Take 237 mLs by mouth 2 (two) times daily between meals.   No facility-administered encounter medications on file as of 11/28/2020.    Patient Active Problem List   Diagnosis Date Noted   Gastroesophageal reflux disease    Depression    Wound of right foot 10/29/2020   Anemia 10/29/2020   HTN (hypertension) 10/29/2020   Sepsis ([place])    Rash 09/05/2020   History of hip replacement    Advance care planning 06/25/2017   Health care maintenance 06/25/2017   Hot flashes 06/25/2017   BPV (benign  positional vertigo) 03/15/2015   Ganglion cyst of wrist 02/23/2015   Extensor tendon rupture of hand 02/01/2015   Left wrist effusion 01/26/2015   KNEE PAIN, RIGHT 11/13/2007   CHONDROMALACIA PATELLA, LEFT 03/19/2007   Osteoporosis 03/19/2007   HEMATURIA, HX OF 03/19/2007   DIVERTICULOSIS, COLON 08/10/2002   HYPERCHOLESTEROLEMIA 06/05/1996     Conditions to be addressed/monitored:HTN and Impaired tissue/ skin integrity  Care Plan : RN- Plan of care  Updates made by Dannielle Karvonen, RN since 11/29/2020 12:00 AM     Problem: Development of care plan for management of impaired tissue/ skin/ wound integrity in patient with chronic conditions ( HTN,)   Priority: High     Long-Range Goal: improved skin integrity and management of chronic conditions   Start Date: 11/28/2020  Expected End Date: 04/11/2021  This Visit's Progress: On track  Priority: High  Note:   Current Barriers:  Knowledge Deficits related to plan of care for management of HTN and skin integrity  RNCM Clinical Goal(s):  Patient will verbalize understanding of plan for management of HTN and skin integrity  through collaboration with RN Care manager, provider, and care team.   Interventions: 1:1 collaboration with primary care provider regarding development and update of comprehensive plan of care as evidenced by provider attestation and co-signature Inter-disciplinary care team collaboration (see longitudinal plan of care) Evaluation of current treatment plan related to  self management and patient's adherence to plan as established by provider  Hypertension Interventions:  New Goal Last practice recorded BP readings:  BP Readings from Last 3 Encounters:  11/10/20 (!) 101/52  10/19/20 100/71  10/07/20 (!) 112/50  Most recent eGFR/CrCl: No results found for: EGFR  No components found for: CRCL  Evaluation of current treatment plan related for hypertension self management and patient's adherence to plan as established by provider; Reviewed medications with patient and discussed importance of compliance; Discussed plans with patient for ongoing care management follow up and provided patient with direct contact information for care management team; Advised patient, providing education and rationale, to monitor blood pressure daily and record, calling PCP for  findings outside established parameters;  Reviewed scheduled/upcoming provider appointments Advised patient to call provider office to scheduled annual wellness visit.  Per chart review last annual wellness visit was 06/26/2018.  Provided patient with education information through Mychart on Hypertension management   Skin Integrity New goal. Evaluation of current treatment plan related for  skin integrity  , self-management and patient's adherence to plan as established by provider. Discussed plans with patient for ongoing care management follow up and provided patient with direct contact information for care management team Provided education to patient re: nutrition that promotes healing Reviewed medications with patient and discussed importance of compliance Reviewed scheduled/upcoming provider appointments  Discussed signs/ symptoms of infection.  Advised to notify provider of symptoms as soon as possible.  Advised patient to continue to work with home health regarding wound care/ wound vac Advised to follow up with providers as recommended.   Patient Goals/Self-Care Activities: Take your medications as prescribed and refill timely Attend all scheduled provider appointments Call provider office for new concerns or questions Review education articles sent to you in [place] on High blood pressure management   Work with the home health agency in management of wound care/ vac.  Call if you have questions Avoid rubbing and scratching areas near/ around wound.  Rubbing and scratching can cause further injury and delay healing Eat a diet that promotes healing (  high protein, calorie diet)  Notify doctor for signs/ symptoms of infection ( increased wound drainage, increasing pain, local warmth and redness, fever)       Plan:The patient has been provided with contact information for the care management team and has been advised to call with any health related questions or concerns.  The care  management team will reach out to the patient again over the next 1-2 months Quinn Plowman RN,BSN,CCM RN Case Manager [facility]  [phone]

## 2020-11-30 DIAGNOSIS — F32A Depression, unspecified: Secondary | ICD-10-CM | POA: Diagnosis not present

## 2020-11-30 DIAGNOSIS — M94261 Chondromalacia, right knee: Secondary | ICD-10-CM | POA: Diagnosis not present

## 2020-11-30 DIAGNOSIS — L03115 Cellulitis of right lower limb: Secondary | ICD-10-CM | POA: Diagnosis not present

## 2020-11-30 DIAGNOSIS — A419 Sepsis, unspecified organism: Secondary | ICD-10-CM | POA: Diagnosis not present

## 2020-11-30 DIAGNOSIS — K219 Gastro-esophageal reflux disease without esophagitis: Secondary | ICD-10-CM | POA: Diagnosis not present

## 2020-11-30 DIAGNOSIS — I10 Essential (primary) hypertension: Secondary | ICD-10-CM | POA: Diagnosis not present

## 2020-12-02 DIAGNOSIS — M94261 Chondromalacia, right knee: Secondary | ICD-10-CM | POA: Diagnosis not present

## 2020-12-02 DIAGNOSIS — A419 Sepsis, unspecified organism: Secondary | ICD-10-CM | POA: Diagnosis not present

## 2020-12-02 DIAGNOSIS — F32A Depression, unspecified: Secondary | ICD-10-CM | POA: Diagnosis not present

## 2020-12-02 DIAGNOSIS — L03115 Cellulitis of right lower limb: Secondary | ICD-10-CM | POA: Diagnosis not present

## 2020-12-02 DIAGNOSIS — K219 Gastro-esophageal reflux disease without esophagitis: Secondary | ICD-10-CM | POA: Diagnosis not present

## 2020-12-02 DIAGNOSIS — I10 Essential (primary) hypertension: Secondary | ICD-10-CM | POA: Diagnosis not present

## 2020-12-05 ENCOUNTER — Other Ambulatory Visit: Payer: Self-pay

## 2020-12-05 ENCOUNTER — Telehealth: Payer: Self-pay | Admitting: Family Medicine

## 2020-12-05 ENCOUNTER — Encounter: Payer: Self-pay | Admitting: Internal Medicine

## 2020-12-05 ENCOUNTER — Ambulatory Visit (INDEPENDENT_AMBULATORY_CARE_PROVIDER_SITE_OTHER): Payer: Medicare Other | Admitting: Internal Medicine

## 2020-12-05 VITALS — BP 138/85 | HR 101 | Temp 98.0°F | Wt 145.0 lb

## 2020-12-05 DIAGNOSIS — L97529 Non-pressure chronic ulcer of other part of left foot with unspecified severity: Secondary | ICD-10-CM | POA: Diagnosis not present

## 2020-12-05 DIAGNOSIS — K219 Gastro-esophageal reflux disease without esophagitis: Secondary | ICD-10-CM | POA: Diagnosis not present

## 2020-12-05 DIAGNOSIS — I10 Essential (primary) hypertension: Secondary | ICD-10-CM | POA: Diagnosis not present

## 2020-12-05 DIAGNOSIS — F32A Depression, unspecified: Secondary | ICD-10-CM | POA: Diagnosis not present

## 2020-12-05 DIAGNOSIS — A419 Sepsis, unspecified organism: Secondary | ICD-10-CM | POA: Diagnosis not present

## 2020-12-05 DIAGNOSIS — L03115 Cellulitis of right lower limb: Secondary | ICD-10-CM | POA: Diagnosis not present

## 2020-12-05 DIAGNOSIS — L97519 Non-pressure chronic ulcer of other part of right foot with unspecified severity: Secondary | ICD-10-CM

## 2020-12-05 DIAGNOSIS — M94261 Chondromalacia, right knee: Secondary | ICD-10-CM | POA: Diagnosis not present

## 2020-12-05 NOTE — Telephone Encounter (Signed)
I teams Gorden Harms to see who is doing FMLA since Tamera left.

## 2020-12-05 NOTE — Progress Notes (Signed)
[facility]  Patient Active Problem List   Diagnosis Date Noted   Gastroesophageal reflux disease    Depression    Wound of right foot 10/29/2020   Anemia 10/29/2020   HTN (hypertension) 10/29/2020   Sepsis ([place])    Rash 09/05/2020   History of hip replacement    Advance care planning 06/25/2017   Health care maintenance 06/25/2017   Hot flashes 06/25/2017   BPV (benign positional vertigo) 03/15/2015   Ganglion cyst of wrist 02/23/2015   Extensor tendon rupture of hand 02/01/2015   Left wrist effusion 01/26/2015   KNEE PAIN, RIGHT 11/13/2007   CHONDROMALACIA PATELLA, LEFT 03/19/2007   Osteoporosis 03/19/2007   HEMATURIA, HX OF 03/19/2007   DIVERTICULOSIS, COLON 08/10/2002   HYPERCHOLESTEROLEMIA 06/05/1996      Subjective:    Patient ID: [name] Price, female    DOB: 1940/12/18, 80 y.o.   MRN: [id number]  No chief complaint on file.   HPI:  [name] MICUCCI is a 80 y.o. female here for hospital f/u right foot chronic wound   I saw patient who was also seen previously by my partner. Patient has a chronic ulcerating wound of unclear etiology but incited after spa appointment   There was initial concern for infection but she didn't respond to bsAbx.   I saw her on 11/09/20 in the hospital. There was wound cx with mrsa and steno. Due to plastic surgery plan for skin grafting, ID recommended 7 day tx with linezolid/cefdinir (finished 10/01). She underwent repeat I&D 9/27 with skin grafting  Of note, she has previous biopsy which didn't suggest vasculitis or pyoderma or malignancy except necrosis/inflammation. No granuloma mentioned. Path was 9/20   I requested sampling of skin edge to sent to [facility] for fungal/mtb-ntm pcr which was negative    Patient has a skin graft. Saw dr Claudia Desanctis 2 weeks prior and to see him in another 2 weeks  Of note she did receive some systemic steroid previously unclear if that made it better  She is not  on any antibiotics now   She has wound vac  No f/c  Allergies  Allergen Reactions   Codeine Nausea And Vomiting      Outpatient Medications Prior to Visit  Medication Sig Dispense Refill   acetaminophen (TYLENOL) 500 MG tablet Take 500 mg by mouth daily as needed (pain).     acidophilus (RISAQUAD) CAPS capsule Take 1 capsule by mouth daily. (Patient not taking: Reported on 11/28/2020) 14 capsule 0   B Complex Vitamins (VITAMIN B COMPLEX) TABS Take 1 tablet by mouth every morning.     Calcium Carbonate-Vitamin D (CALCIUM-D PO) Take 1 tablet by mouth 2 (two) times daily.     Cholecalciferol (VITAMIN D-3 PO) Take 1 tablet by mouth every morning.     feeding supplement (ENSURE SURGERY) LIQD Take 237 mLs by mouth 2 (two) times daily between meals.     ferrous gluconate (FERGON) 324 MG tablet Take 1 tablet (324 mg total) by mouth 2 (two) times daily with a meal.  3   pantoprazole (PROTONIX) 40 MG tablet Take 1 tablet (40 mg total) by mouth 2 (two) times daily. 60 tablet 0   PARoxetine (PAXIL-CR) 25 MG 24 hr tablet Take 25 mg by mouth every morning.     No facility-administered medications prior to visit.     Social History   Socioeconomic History   Marital status: Married    Spouse  name: Not on file   Number of children: Not on file   Years of education: Not on file   Highest education level: Not on file  Occupational History   Not on file  Tobacco Use   Smoking status: Never   Smokeless tobacco: Never  Vaping Use   Vaping Use: Never used  Substance and Sexual Activity   Alcohol use: No    Comment: rare once a year   Drug use: No   Sexual activity: Not Currently  Other Topics Concern   Not on file  Social History Narrative   Married 1963   Likes to travel   2 daughters   Social Determinants of Radio broadcast assistant Strain: Not on file  Food Insecurity: No Food Insecurity   Worried About Charity fundraiser in the Last Year: Never true   Arboriculturist in  the Last Year: Never true  Transportation Needs: No Transportation Needs   Lack of Transportation (Medical): No   Lack of Transportation (Non-Medical): No  Physical Activity: Not on file  Stress: Not on file  Social Connections: Not on file  Intimate Partner Violence: Not on file      Review of Systems     Objective:    Wt 145 lb (65.8 kg)   BMI 25.69 kg/m  Nursing note and vital signs reviewed.  Physical Exam      General/constitutional: no distress, pleasant HEENT: Normocephalic, PER, Conj Clear, EOMI, Oropharynx clear Neck supple CV: rrr no mrg Lungs: clear to auscultation, normal respiratory effort Abd: Soft, Nontender Ext: no edema Neuro: nonfocal MSK: no peripheral joint swelling/tenderness/warmth; back spines nontender  Skin: from 10/05 picture; she is in extensive dressing/wound vac/boot didn't open      Labs:  Micro:  Serology:  Imaging:  Assessment & Plan:   Problem List Items Addressed This Visit   None Visit Diagnoses     Ulcers of both great toes (HCC)    -  Primary      Unclear what this is Pcr from uw negative for atypical fungal/ntm infection Doesn't appear to be any typical bacterial infection  Pyoderma remains in differential. Appears to be doing better with pork skin graft. If continues to improve, per patient to have skin/flap coverage   No need for f/u Return as needed if superimposed infection   Follow-up: No follow-ups on file.   I have spent a total of 20 minutes of face-to-face and non-face-to-face time, excluding clinical staff time, preparing to see patient, ordering tests and/or medications, and provide counseling the patient    Jabier Mutton, [place] for [facility] [place] [phone]  pager   [phone] cell 12/05/2020, 3:46 PM

## 2020-12-05 NOTE — Patient Instructions (Signed)
It doesn't appear this is due to infection   So in a way it is optimistic as your wound is doing better.   If there is pus coming out or surrounding redness/warmth/pain please call my clinic

## 2020-12-06 LAB — FUNGUS CULTURE RESULT

## 2020-12-06 LAB — FUNGUS CULTURE WITH STAIN

## 2020-12-06 LAB — FUNGAL ORGANISM REFLEX

## 2020-12-07 ENCOUNTER — Other Ambulatory Visit: Payer: Self-pay | Admitting: Family Medicine

## 2020-12-07 DIAGNOSIS — R21 Rash and other nonspecific skin eruption: Secondary | ICD-10-CM

## 2020-12-07 NOTE — Telephone Encounter (Signed)
FMLA forms done and have been faxed. Copy has been sent to scan and patients daughter is aware this has been done.

## 2020-12-08 ENCOUNTER — Telehealth: Payer: Self-pay

## 2020-12-08 ENCOUNTER — Other Ambulatory Visit: Payer: Self-pay

## 2020-12-08 ENCOUNTER — Ambulatory Visit (INDEPENDENT_AMBULATORY_CARE_PROVIDER_SITE_OTHER): Payer: Medicare Other | Admitting: Surgical

## 2020-12-08 DIAGNOSIS — S91301D Unspecified open wound, right foot, subsequent encounter: Secondary | ICD-10-CM | POA: Diagnosis not present

## 2020-12-08 NOTE — Telephone Encounter (Signed)
confirmation received and forwarded to be scanned into chart.

## 2020-12-08 NOTE — Progress Notes (Signed)
   Referring Provider Tonia Ghent, MD [place],  [place] [address]   CC:  Chief Complaint  Patient presents with   Follow-up      [name] Price is an 80 y.o. female.  HPI: 80 year old female here for follow-up after debridement of right foot wound and Integra placement with Dr. Claudia Desanctis.  She has been doing wound VAC changes at home with assistance from home health.  She feels good and is doing well.  She is not having any infectious symptoms.  She has been using the walking boot.  She has no complaints.  Review of Systems General: No fevers or chills  Physical Exam Vitals with BMI 12/05/2020 11/10/2020 11/09/2020  Height - - -  Weight 145 lbs - -  BMI - - -  Systolic 001 749 90  Diastolic 85 52 53  Pulse 449 89 96    General:  No acute distress,  Alert and oriented, Non-Toxic, Normal speech and affect On exam of the right foot the majority of the Integra has begun to incorporate, there is some residual unincorporated Integra noted at the distal portion of the right foot that is approximately 4 x 4 cm in size.  The distal foot is warm to touch.  Good capillary refill and color.  There is some surrounding erythema, but this appears more irritated than infected at this time.    Assessment/Plan We will transition from wound VAC dressing changes 3 times per week to Xeroform dressing changes 3 times per week.  She can continue on the current dressing change schedule that she has with home health RN.  Recommend Xeroform followed by 4 x 4 gauze, ABD pad, Kerlix, Ace wrap 3 times per week.  She should continue to wear the walking boot and continue to only weight-bear on the heel, but she can now sleep without the walking boot.  I do not see any signs of infection on exam.  Recommend following up in 3 weeks for reevaluation.  Pictures were taken and placed in the patient's chart with patient's permission.  [name] Price [name] Price 12/08/2020, 1:20 PM

## 2020-12-09 DIAGNOSIS — L03115 Cellulitis of right lower limb: Secondary | ICD-10-CM | POA: Diagnosis not present

## 2020-12-09 DIAGNOSIS — I10 Essential (primary) hypertension: Secondary | ICD-10-CM | POA: Diagnosis not present

## 2020-12-09 DIAGNOSIS — M94261 Chondromalacia, right knee: Secondary | ICD-10-CM | POA: Diagnosis not present

## 2020-12-09 DIAGNOSIS — K219 Gastro-esophageal reflux disease without esophagitis: Secondary | ICD-10-CM | POA: Diagnosis not present

## 2020-12-09 DIAGNOSIS — A419 Sepsis, unspecified organism: Secondary | ICD-10-CM | POA: Diagnosis not present

## 2020-12-09 DIAGNOSIS — F32A Depression, unspecified: Secondary | ICD-10-CM | POA: Diagnosis not present

## 2020-12-12 DIAGNOSIS — I10 Essential (primary) hypertension: Secondary | ICD-10-CM

## 2020-12-13 DIAGNOSIS — I10 Essential (primary) hypertension: Secondary | ICD-10-CM | POA: Diagnosis not present

## 2020-12-13 DIAGNOSIS — L309 Dermatitis, unspecified: Secondary | ICD-10-CM | POA: Diagnosis not present

## 2020-12-13 DIAGNOSIS — L03115 Cellulitis of right lower limb: Secondary | ICD-10-CM | POA: Diagnosis not present

## 2020-12-13 DIAGNOSIS — K219 Gastro-esophageal reflux disease without esophagitis: Secondary | ICD-10-CM | POA: Diagnosis not present

## 2020-12-13 DIAGNOSIS — F32A Depression, unspecified: Secondary | ICD-10-CM | POA: Diagnosis not present

## 2020-12-13 DIAGNOSIS — Z23 Encounter for immunization: Secondary | ICD-10-CM | POA: Diagnosis not present

## 2020-12-13 DIAGNOSIS — A419 Sepsis, unspecified organism: Secondary | ICD-10-CM | POA: Diagnosis not present

## 2020-12-13 DIAGNOSIS — M94261 Chondromalacia, right knee: Secondary | ICD-10-CM | POA: Diagnosis not present

## 2020-12-14 DIAGNOSIS — K219 Gastro-esophageal reflux disease without esophagitis: Secondary | ICD-10-CM | POA: Diagnosis not present

## 2020-12-14 DIAGNOSIS — F32A Depression, unspecified: Secondary | ICD-10-CM | POA: Diagnosis not present

## 2020-12-14 DIAGNOSIS — L03115 Cellulitis of right lower limb: Secondary | ICD-10-CM | POA: Diagnosis not present

## 2020-12-14 DIAGNOSIS — A419 Sepsis, unspecified organism: Secondary | ICD-10-CM | POA: Diagnosis not present

## 2020-12-14 DIAGNOSIS — M94261 Chondromalacia, right knee: Secondary | ICD-10-CM | POA: Diagnosis not present

## 2020-12-14 DIAGNOSIS — I10 Essential (primary) hypertension: Secondary | ICD-10-CM | POA: Diagnosis not present

## 2020-12-16 DIAGNOSIS — M94261 Chondromalacia, right knee: Secondary | ICD-10-CM | POA: Diagnosis not present

## 2020-12-16 DIAGNOSIS — A419 Sepsis, unspecified organism: Secondary | ICD-10-CM | POA: Diagnosis not present

## 2020-12-16 DIAGNOSIS — F32A Depression, unspecified: Secondary | ICD-10-CM | POA: Diagnosis not present

## 2020-12-16 DIAGNOSIS — L03115 Cellulitis of right lower limb: Secondary | ICD-10-CM | POA: Diagnosis not present

## 2020-12-16 DIAGNOSIS — I10 Essential (primary) hypertension: Secondary | ICD-10-CM | POA: Diagnosis not present

## 2020-12-16 DIAGNOSIS — K219 Gastro-esophageal reflux disease without esophagitis: Secondary | ICD-10-CM | POA: Diagnosis not present

## 2020-12-18 LAB — ACID FAST CULTURE WITH REFLEXED SENSITIVITIES (MYCOBACTERIA): Acid Fast Culture: NEGATIVE

## 2020-12-19 DIAGNOSIS — M94261 Chondromalacia, right knee: Secondary | ICD-10-CM | POA: Diagnosis not present

## 2020-12-19 DIAGNOSIS — K219 Gastro-esophageal reflux disease without esophagitis: Secondary | ICD-10-CM | POA: Diagnosis not present

## 2020-12-19 DIAGNOSIS — A419 Sepsis, unspecified organism: Secondary | ICD-10-CM | POA: Diagnosis not present

## 2020-12-19 DIAGNOSIS — F32A Depression, unspecified: Secondary | ICD-10-CM | POA: Diagnosis not present

## 2020-12-19 DIAGNOSIS — I10 Essential (primary) hypertension: Secondary | ICD-10-CM | POA: Diagnosis not present

## 2020-12-19 DIAGNOSIS — L03115 Cellulitis of right lower limb: Secondary | ICD-10-CM | POA: Diagnosis not present

## 2020-12-20 DIAGNOSIS — M810 Age-related osteoporosis without current pathological fracture: Secondary | ICD-10-CM | POA: Diagnosis not present

## 2020-12-20 DIAGNOSIS — I10 Essential (primary) hypertension: Secondary | ICD-10-CM | POA: Diagnosis not present

## 2020-12-20 DIAGNOSIS — F32A Depression, unspecified: Secondary | ICD-10-CM | POA: Diagnosis not present

## 2020-12-20 DIAGNOSIS — L03115 Cellulitis of right lower limb: Secondary | ICD-10-CM | POA: Diagnosis not present

## 2020-12-20 DIAGNOSIS — Z853 Personal history of malignant neoplasm of breast: Secondary | ICD-10-CM | POA: Diagnosis not present

## 2020-12-20 DIAGNOSIS — K5730 Diverticulosis of large intestine without perforation or abscess without bleeding: Secondary | ICD-10-CM | POA: Diagnosis not present

## 2020-12-20 DIAGNOSIS — M94261 Chondromalacia, right knee: Secondary | ICD-10-CM | POA: Diagnosis not present

## 2020-12-20 DIAGNOSIS — A419 Sepsis, unspecified organism: Secondary | ICD-10-CM | POA: Diagnosis not present

## 2020-12-20 DIAGNOSIS — K219 Gastro-esophageal reflux disease without esophagitis: Secondary | ICD-10-CM | POA: Diagnosis not present

## 2020-12-20 DIAGNOSIS — D649 Anemia, unspecified: Secondary | ICD-10-CM | POA: Diagnosis not present

## 2020-12-20 DIAGNOSIS — H8110 Benign paroxysmal vertigo, unspecified ear: Secondary | ICD-10-CM | POA: Diagnosis not present

## 2020-12-20 DIAGNOSIS — E7800 Pure hypercholesterolemia, unspecified: Secondary | ICD-10-CM | POA: Diagnosis not present

## 2020-12-21 DIAGNOSIS — I10 Essential (primary) hypertension: Secondary | ICD-10-CM | POA: Diagnosis not present

## 2020-12-21 DIAGNOSIS — A419 Sepsis, unspecified organism: Secondary | ICD-10-CM | POA: Diagnosis not present

## 2020-12-21 DIAGNOSIS — L03115 Cellulitis of right lower limb: Secondary | ICD-10-CM | POA: Diagnosis not present

## 2020-12-21 DIAGNOSIS — F32A Depression, unspecified: Secondary | ICD-10-CM | POA: Diagnosis not present

## 2020-12-21 DIAGNOSIS — M94261 Chondromalacia, right knee: Secondary | ICD-10-CM | POA: Diagnosis not present

## 2020-12-21 DIAGNOSIS — K219 Gastro-esophageal reflux disease without esophagitis: Secondary | ICD-10-CM | POA: Diagnosis not present

## 2020-12-23 DIAGNOSIS — K219 Gastro-esophageal reflux disease without esophagitis: Secondary | ICD-10-CM | POA: Diagnosis not present

## 2020-12-23 DIAGNOSIS — I10 Essential (primary) hypertension: Secondary | ICD-10-CM | POA: Diagnosis not present

## 2020-12-23 DIAGNOSIS — M94261 Chondromalacia, right knee: Secondary | ICD-10-CM | POA: Diagnosis not present

## 2020-12-23 DIAGNOSIS — A419 Sepsis, unspecified organism: Secondary | ICD-10-CM | POA: Diagnosis not present

## 2020-12-23 DIAGNOSIS — L03115 Cellulitis of right lower limb: Secondary | ICD-10-CM | POA: Diagnosis not present

## 2020-12-23 DIAGNOSIS — F32A Depression, unspecified: Secondary | ICD-10-CM | POA: Diagnosis not present

## 2020-12-26 DIAGNOSIS — L03115 Cellulitis of right lower limb: Secondary | ICD-10-CM | POA: Diagnosis not present

## 2020-12-26 DIAGNOSIS — I10 Essential (primary) hypertension: Secondary | ICD-10-CM | POA: Diagnosis not present

## 2020-12-26 DIAGNOSIS — A419 Sepsis, unspecified organism: Secondary | ICD-10-CM | POA: Diagnosis not present

## 2020-12-26 DIAGNOSIS — M94261 Chondromalacia, right knee: Secondary | ICD-10-CM | POA: Diagnosis not present

## 2020-12-26 DIAGNOSIS — K219 Gastro-esophageal reflux disease without esophagitis: Secondary | ICD-10-CM | POA: Diagnosis not present

## 2020-12-26 DIAGNOSIS — F32A Depression, unspecified: Secondary | ICD-10-CM | POA: Diagnosis not present

## 2020-12-28 ENCOUNTER — Ambulatory Visit (INDEPENDENT_AMBULATORY_CARE_PROVIDER_SITE_OTHER): Payer: Medicare Other | Admitting: Surgical

## 2020-12-28 ENCOUNTER — Encounter: Payer: Self-pay | Admitting: Surgical

## 2020-12-28 ENCOUNTER — Other Ambulatory Visit: Payer: Self-pay

## 2020-12-28 DIAGNOSIS — S91301D Unspecified open wound, right foot, subsequent encounter: Secondary | ICD-10-CM | POA: Diagnosis not present

## 2020-12-28 NOTE — Progress Notes (Signed)
   Referring Provider Tonia Ghent, MD [place],  [place] [address]   CC:  Chief Complaint  Patient presents with   Follow-up      [name] Price is an 80 y.o. female.  HPI: 80 year old female here for follow-up after debridement of right foot wound and Integra placement with Dr. Claudia Desanctis.  She is here with her daughter.  She feels as if things are going well with dressing changes.  She has been receiving home health assistance 3 times per week.  She is not having any infectious symptoms.  She has continued to use the walking boot.  She is 2 months postop.  Review of Systems General: No fevers or chills  Physical Exam Vitals with BMI 12/05/2020 11/10/2020 11/09/2020  Height - - -  Weight 145 lbs - -  BMI - - -  Systolic 401 027 90  Diastolic 85 52 53  Pulse 253 89 96    General:  No acute distress,  Alert and oriented, Non-Toxic, Normal speech and affect Right lower extremity: Good base of granulation tissue noted, 16 x 16 cm wound.  The Integra has completely incorporated.  The distal foot is warm to touch.  Good capillary refill and color.  There is some surrounding erythema but appears more irritated than infected at this time.  I do not appreciate any cellulitic changes.  No foul odors are noted.   Assessment/Plan  Recommend continuing with Xeroform dressing changes 3 times per week.  Recommend continue with current dressing change schedule that she has with home health RN.  We will plan for split-thickness skin graft to right lower extremity wound with Dr. Claudia Desanctis in the next few weeks.  Patient was provided with the general surgical risk consent forms and was in courage to read this prior to her preoperative appointment.  We discussed the risks associated with the split-thickness skin graft today which included but did not limit failure of the graft, need for additional procedures, need for ongoing wound care, cardiac and pulmonary complications from anesthesia.   Patient was understanding of this and all of her questions were answered to her content.  Pictures taken and placed in her chart with patient's permission.  [name] Price 12/28/2020, 2:22 PM

## 2020-12-30 DIAGNOSIS — K219 Gastro-esophageal reflux disease without esophagitis: Secondary | ICD-10-CM | POA: Diagnosis not present

## 2020-12-30 DIAGNOSIS — A419 Sepsis, unspecified organism: Secondary | ICD-10-CM | POA: Diagnosis not present

## 2020-12-30 DIAGNOSIS — F32A Depression, unspecified: Secondary | ICD-10-CM | POA: Diagnosis not present

## 2020-12-30 DIAGNOSIS — M94261 Chondromalacia, right knee: Secondary | ICD-10-CM | POA: Diagnosis not present

## 2020-12-30 DIAGNOSIS — L03115 Cellulitis of right lower limb: Secondary | ICD-10-CM | POA: Diagnosis not present

## 2020-12-30 DIAGNOSIS — I10 Essential (primary) hypertension: Secondary | ICD-10-CM | POA: Diagnosis not present

## 2021-01-02 DIAGNOSIS — I10 Essential (primary) hypertension: Secondary | ICD-10-CM | POA: Diagnosis not present

## 2021-01-02 DIAGNOSIS — A419 Sepsis, unspecified organism: Secondary | ICD-10-CM | POA: Diagnosis not present

## 2021-01-02 DIAGNOSIS — K219 Gastro-esophageal reflux disease without esophagitis: Secondary | ICD-10-CM | POA: Diagnosis not present

## 2021-01-02 DIAGNOSIS — L03115 Cellulitis of right lower limb: Secondary | ICD-10-CM | POA: Diagnosis not present

## 2021-01-02 DIAGNOSIS — F32A Depression, unspecified: Secondary | ICD-10-CM | POA: Diagnosis not present

## 2021-01-02 DIAGNOSIS — M94261 Chondromalacia, right knee: Secondary | ICD-10-CM | POA: Diagnosis not present

## 2021-01-03 ENCOUNTER — Encounter (HOSPITAL_BASED_OUTPATIENT_CLINIC_OR_DEPARTMENT_OTHER): Payer: Self-pay | Admitting: Plastic Surgery

## 2021-01-03 ENCOUNTER — Other Ambulatory Visit: Payer: Self-pay

## 2021-01-04 ENCOUNTER — Telehealth: Payer: Self-pay | Admitting: Plastic Surgery

## 2021-01-04 DIAGNOSIS — K219 Gastro-esophageal reflux disease without esophagitis: Secondary | ICD-10-CM | POA: Diagnosis not present

## 2021-01-04 DIAGNOSIS — F32A Depression, unspecified: Secondary | ICD-10-CM | POA: Diagnosis not present

## 2021-01-04 DIAGNOSIS — I10 Essential (primary) hypertension: Secondary | ICD-10-CM | POA: Diagnosis not present

## 2021-01-04 DIAGNOSIS — M94261 Chondromalacia, right knee: Secondary | ICD-10-CM | POA: Diagnosis not present

## 2021-01-04 DIAGNOSIS — L03115 Cellulitis of right lower limb: Secondary | ICD-10-CM | POA: Diagnosis not present

## 2021-01-04 DIAGNOSIS — A419 Sepsis, unspecified organism: Secondary | ICD-10-CM | POA: Diagnosis not present

## 2021-01-04 NOTE — Telephone Encounter (Signed)
Louie Casa from [place] called this afternoon and said that he was concerned with the color of liquid coming from her drain.  He said it seems to be green and wanted to make sure there wasn't anything they needed to do.

## 2021-01-04 NOTE — Telephone Encounter (Signed)
-----   Message from [name], MD sent at 01/04/2021  3:12 PM EST ----- Regarding: RE: Drainage Color Sounds fine.  No concerns. ----- Message ----- From: Clemens Catholic Sent: 01/04/2021  12:32 PM EST To: Annett Gula, CMA, # Subject: Drainage Color                                 Randy from [place] called this afternoon and said that he was concerned with the color of liquid coming from her drain. He said it seems to be green and wanted to make sure there wasn't anything they needed to do.  If phone number is needed, [phone]  Let me know if there is anything I can do.

## 2021-01-04 NOTE — Telephone Encounter (Signed)
Informed [name] Price that per Dr. Claudia Desanctis, he has no concerns and that what he reported sounds fine. [name] Price conveyed understanding. He reported the green reminds him of pseudomonas. I adv him if the pt developed any new Sx such as fever, redness, n/v, tenderness, increased pain or change of appearance in drainage, call the office and he can leave a message and it will go straight to the provider on call which is Dr.Pace this week. He understood.

## 2021-01-06 DIAGNOSIS — F32A Depression, unspecified: Secondary | ICD-10-CM | POA: Diagnosis not present

## 2021-01-06 DIAGNOSIS — A419 Sepsis, unspecified organism: Secondary | ICD-10-CM | POA: Diagnosis not present

## 2021-01-06 DIAGNOSIS — I10 Essential (primary) hypertension: Secondary | ICD-10-CM | POA: Diagnosis not present

## 2021-01-06 DIAGNOSIS — K219 Gastro-esophageal reflux disease without esophagitis: Secondary | ICD-10-CM | POA: Diagnosis not present

## 2021-01-06 DIAGNOSIS — L03115 Cellulitis of right lower limb: Secondary | ICD-10-CM | POA: Diagnosis not present

## 2021-01-06 DIAGNOSIS — M94261 Chondromalacia, right knee: Secondary | ICD-10-CM | POA: Diagnosis not present

## 2021-01-09 ENCOUNTER — Other Ambulatory Visit: Payer: Self-pay

## 2021-01-09 ENCOUNTER — Ambulatory Visit (INDEPENDENT_AMBULATORY_CARE_PROVIDER_SITE_OTHER): Payer: Medicare Other | Admitting: Physician Assistant

## 2021-01-09 VITALS — BP 119/82 | HR 97 | Ht 63.0 in | Wt 143.8 lb

## 2021-01-09 DIAGNOSIS — F32A Depression, unspecified: Secondary | ICD-10-CM | POA: Diagnosis not present

## 2021-01-09 DIAGNOSIS — K219 Gastro-esophageal reflux disease without esophagitis: Secondary | ICD-10-CM | POA: Diagnosis not present

## 2021-01-09 DIAGNOSIS — S91301D Unspecified open wound, right foot, subsequent encounter: Secondary | ICD-10-CM

## 2021-01-09 DIAGNOSIS — A419 Sepsis, unspecified organism: Secondary | ICD-10-CM | POA: Diagnosis not present

## 2021-01-09 DIAGNOSIS — L03115 Cellulitis of right lower limb: Secondary | ICD-10-CM | POA: Diagnosis not present

## 2021-01-09 DIAGNOSIS — I10 Essential (primary) hypertension: Secondary | ICD-10-CM | POA: Diagnosis not present

## 2021-01-09 DIAGNOSIS — M94261 Chondromalacia, right knee: Secondary | ICD-10-CM | POA: Diagnosis not present

## 2021-01-09 MED ORDER — ONDANSETRON 4 MG PO TBDP: 4.0000 mg | ORAL_TABLET | Freq: Three times a day (TID) | ORAL | 0 refills | Status: DC | PRN

## 2021-01-09 MED ORDER — HYDROCODONE-ACETAMINOPHEN 5-325 MG PO TABS: 1.0000 | ORAL_TABLET | Freq: Four times a day (QID) | ORAL | 0 refills | Status: DC | PRN

## 2021-01-09 NOTE — H&P (View-Only) (Signed)
Patient ID: [name] Price, female    DOB: 25-Nov-1940, 80 y.o.   MRN: [id number]  Chief Complaint  Patient presents with   Pre-op Exam      ICD-10-CM   1. Open wound of right foot, subsequent encounter  S91.301D        History of Present Illness: [name] Price is a 80 y.o.  female  with a history of hospital admission for right foot cellulitis s/p debridement with placement of wound matrix and wound VAC 11/08/2020.  She presents for preoperative evaluation for upcoming procedure, split-thickness skin graft, scheduled for 01/13/2021 with Dr. Claudia Desanctis.  The patient has not had problems with anesthesia.  She endorses personal history of breast cancer, but denies any personal or family history of blood clots or clotting disorder.  No sickness or hospitalizations in the past 3 days.  She does report nasal congestion last week, but denies any fevers, chills, or significant illness.  She feels improved.  She is holding her vitamins.  She denies any tobacco use or history of diabetes.  Summary of Previous Visit: Patient was last seen here in clinic 12/28/2020 s/p debridement of right foot wound with Integra placement.  She is receiving home health assistance 3 times per week and feels as though she is doing well with dressing changes.  Continuing to use her walking boot.  Physical exam was reassuring.  16 x 16 cm wound, good base of granular tissue noted.  Integra had completely incorporated.  No cellulitic findings.  Plan was for continued Xeroform dressing changes 3 times per week as well as for STSG.  Risks of STSG discussed with the patient.  Pictures were taken and placed in chart.  She was provided with the General surgical consent forms for her to read prior to today's telephone encounter.  PMH Significant for: Right foot cellulitis.   Past Medical History: Allergies: Allergies  Allergen Reactions   Codeine Nausea And Vomiting    Current Medications:  Current Outpatient  Medications:    acetaminophen (TYLENOL) 500 MG tablet, Take 500 mg by mouth daily as needed (pain)., Disp: , Rfl:    acidophilus (RISAQUAD) CAPS capsule, Take 1 capsule by mouth daily., Disp: 14 capsule, Rfl: 0   B Complex Vitamins (VITAMIN B COMPLEX) TABS, Take 1 tablet by mouth every morning., Disp: , Rfl:    Calcium Carbonate-Vitamin D (CALCIUM-D PO), Take 1 tablet by mouth 2 (two) times daily., Disp: , Rfl:    cetirizine (ZYRTEC) 10 MG chewable tablet, Chew 10 mg by mouth daily., Disp: , Rfl:    Cholecalciferol (VITAMIN D-3 PO), Take 1 tablet by mouth every morning., Disp: , Rfl:    ferrous gluconate (FERGON) 324 MG tablet, Take 1 tablet (324 mg total) by mouth 2 (two) times daily with a meal., Disp: , Rfl: 3   PARoxetine (PAXIL-CR) 25 MG 24 hr tablet, Take 25 mg by mouth every morning., Disp: , Rfl:    HYDROcodone-acetaminophen (NORCO) 5-325 MG tablet, Take 1 tablet by mouth every 6 (six) hours as needed for up to 6 doses for severe pain., Disp: 6 tablet, Rfl: 0   ondansetron (ZOFRAN-ODT) 4 MG disintegrating tablet, Take 1 tablet (4 mg total) by mouth every 8 (eight) hours as needed for nausea or vomiting., Disp: 20 tablet, Rfl: 0   pantoprazole (PROTONIX) 40 MG tablet, Take 1 tablet (40 mg total) by mouth 2 (two) times daily., Disp: 60 tablet, Rfl: 0  Past Medical Problems: Past Medical History:  Diagnosis Date   Allergy    Anxiety    Arthritis    Blood transfusion without reported diagnosis    as baby   Breast cancer ([place]) 1999-2000   s/p lumpectomy and radiation, no chemo-right   History of chicken pox    History of hip replacement    Hot flashes    treated wtih paxil   Hyperlipidemia    Osteoporosis    DXA 11/12/17   UTI (urinary tract infection)     Past Surgical History: Past Surgical History:  Procedure Laterality Date   APPLICATION OF WOUND VAC Right 11/08/2020   Procedure: APPLICATION OF WOUND VAC;  Surgeon: [name], MD;  Location: [facility];  Service: Plastics;   Laterality: Right;   BREAST SURGERY Right    CATARACT EXTRACTION W/PHACO Left 12/28/2014   Procedure: CATARACT EXTRACTION PHACO AND INTRAOCULAR LENS PLACEMENT ([place]);  Surgeon: Birder Robson, MD;  Location: [facility];  Service: Ophthalmology;  Laterality: Left;  [place] 00:38 AP% 19.9 CDE 7.70 fluid pack lot # [id number]   CATARACT EXTRACTION W/PHACO Right 07/10/2016   Procedure: CATARACT EXTRACTION PHACO AND INTRAOCULAR LENS PLACEMENT ([facility]);  Surgeon: Birder Robson, MD;  Location: [facility];  Service: Ophthalmology;  Laterality: Right;  [place] 00:38 AP% 18.3 CDE 7.06 Fluid pack lot # [id number]   COLONOSCOPY  2016   DEBRIDEMENT AND CLOSURE WOUND Right 11/01/2020   Procedure: DEBRIDEMENT RIGHT FOOT WOUND;  Surgeon: [name], MD;  Location: [facility];  Service: Plastics;  Laterality: Right;  60 minutes   DEBRIDEMENT AND CLOSURE WOUND Right 11/08/2020   Procedure: Debridement of r. foot wound;  Surgeon: [name], MD;  Location: [facility];  Service: Plastics;  Laterality: Right;   EYE SURGERY     JOINT REPLACEMENT Bilateral    hip  / shoulder   OOPHORECTOMY Left    POLYPECTOMY     TOTAL HIP ARTHROPLASTY Bilateral    (2) Two   TOTAL SHOULDER REPLACEMENT      Social History: Social History   Socioeconomic History   Marital status: Married    Spouse name: Not on file   Number of children: Not on file   Years of education: Not on file   Highest education level: Not on file  Occupational History   Not on file  Tobacco Use   Smoking status: Never   Smokeless tobacco: Never  Vaping Use   Vaping Use: Never used  Substance and Sexual Activity   Alcohol use: No    Comment: rare once a year   Drug use: No   Sexual activity: Not Currently  Other Topics Concern   Not on file  Social History Narrative   Married 1963   Likes to travel   2 daughters   Social Determinants of Radio broadcast assistant Strain: Not on file  Food Insecurity: No Food Insecurity   Worried About Paediatric nurse in the Last Year: Never true   Arboriculturist in the Last Year: Never true  Transportation Needs: No Transportation Needs   Lack of Transportation (Medical): No   Lack of Transportation (Non-Medical): No  Physical Activity: Not on file  Stress: Not on file  Social Connections: Not on file  Intimate Partner Violence: Not on file    Family History: Family History  Problem Relation Age of Onset   Lung cancer Mother    Heart disease Mother    Colon cancer Mother  late 41's   Cancer Father    Colon cancer Father 66       died at 39   Breast cancer Maternal Aunt    Breast cancer Maternal Aunt    Breast cancer Maternal Aunt    Rectal cancer Neg Hx    Stomach cancer Neg Hx    Crohn's disease Neg Hx    Esophageal cancer Neg Hx     Review of Systems: ROS Endorses nasal congestion last week, improved.  Denies fevers, chills, or other symptoms.  Physical Exam: Vital Signs BP 119/82 (BP Location: Left Arm, Patient Position: Sitting, Cuff Size: Small)   Pulse 97   Ht 5\' 3"  (1.6 m)   Wt 143 lb 12.8 oz (65.2 kg)   SpO2 98%   BMI 25.47 kg/m   Physical Exam  Constitutional:      General: Not in acute distress.    Appearance: Normal appearance. Not ill-appearing.  HENT:     Head: Normocephalic and atraumatic.  Eyes:     Pupils: Pupils are equal, round Neck:     Musculoskeletal: Normal range of motion.  Cardiovascular:     Rate and Rhythm: Normal rate    Pulses: Normal pulses.  Pulmonary:     Effort: Pulmonary effort is normal. No respiratory distress.  Abdominal:     General: Abdomen is flat. There is no distension.  Musculoskeletal: Normal range of motion.  Right lower leg in walking boot.  Left leg without any swelling or evidence of varicosities. Skin:    General: Skin is warm and dry.     Findings: No erythema or rash.  Neurological:     General: No focal deficit present.     Mental Status: Alert and oriented to person, place, and time. Mental  status is at baseline.     Motor: No weakness.  Psychiatric:        Mood and Affect: Mood normal.        Behavior: Behavior normal.   Assessment/Plan: The patient is scheduled for STSG 01/13/2021 with Dr. Claudia Desanctis.  Risks, benefits, and alternatives of procedure discussed, questions answered and consent obtained.    Smoking Status: Non-smoker.  Caprini Score: 9; Risk Factors include: Age, personal history of malignancy, immobilization, BMI greater than 25, and length of planned surgery. Recommendation for mechanical and possibly pharmacologic prophylaxis.  Will discuss with surgeon.  Pictures obtained: 12/28/2020.  Post-op Rx sent to pharmacy: Zofran and Norco #8 pills.    Patient was provided with the General Surgical Risk consent document and Pain Medication Agreement prior to their appointment.  They had adequate time to read through the risk consent documents and Pain Medication Agreement. We also discussed them in person together during this preop appointment. All of their questions were answered to their satisfaction.  Recommended calling if they have any further questions.  Risk consent form and Pain Medication Agreement to be scanned into patient's chart.   Electronically signed by: Krista Blue, PA-C 01/09/2021 11:17 AM

## 2021-01-09 NOTE — Progress Notes (Signed)
Surgical soap given with instructions, pt verbalized understanding.  

## 2021-01-09 NOTE — Progress Notes (Signed)
Patient ID: [name] Price, female    DOB: September 06, 1940, 80 y.o.   MRN: [id number]  Chief Complaint  Patient presents with   Pre-op Exam      ICD-10-CM   1. Open wound of right foot, subsequent encounter  S91.301D        History of Present Illness: [name] Price is a 80 y.o.  female  with a history of hospital admission for right foot cellulitis s/p debridement with placement of wound matrix and wound VAC 11/08/2020.  She presents for preoperative evaluation for upcoming procedure, split-thickness skin graft, scheduled for 01/13/2021 with Dr. Claudia Desanctis.  The patient has not had problems with anesthesia.  She endorses personal history of breast cancer, but denies any personal or family history of blood clots or clotting disorder.  No sickness or hospitalizations in the past 3 days.  She does report nasal congestion last week, but denies any fevers, chills, or significant illness.  She feels improved.  She is holding her vitamins.  She denies any tobacco use or history of diabetes.  Summary of Previous Visit: Patient was last seen here in clinic 12/28/2020 s/p debridement of right foot wound with Integra placement.  She is receiving home health assistance 3 times per week and feels as though she is doing well with dressing changes.  Continuing to use her walking boot.  Physical exam was reassuring.  16 x 16 cm wound, good base of granular tissue noted.  Integra had completely incorporated.  No cellulitic findings.  Plan was for continued Xeroform dressing changes 3 times per week as well as for STSG.  Risks of STSG discussed with the patient.  Pictures were taken and placed in chart.  She was provided with the General surgical consent forms for her to read prior to today's telephone encounter.  PMH Significant for: Right foot cellulitis.   Past Medical History: Allergies: Allergies  Allergen Reactions   Codeine Nausea And Vomiting    Current Medications:  Current Outpatient  Medications:    acetaminophen (TYLENOL) 500 MG tablet, Take 500 mg by mouth daily as needed (pain)., Disp: , Rfl:    acidophilus (RISAQUAD) CAPS capsule, Take 1 capsule by mouth daily., Disp: 14 capsule, Rfl: 0   B Complex Vitamins (VITAMIN B COMPLEX) TABS, Take 1 tablet by mouth every morning., Disp: , Rfl:    Calcium Carbonate-Vitamin D (CALCIUM-D PO), Take 1 tablet by mouth 2 (two) times daily., Disp: , Rfl:    cetirizine (ZYRTEC) 10 MG chewable tablet, Chew 10 mg by mouth daily., Disp: , Rfl:    Cholecalciferol (VITAMIN D-3 PO), Take 1 tablet by mouth every morning., Disp: , Rfl:    ferrous gluconate (FERGON) 324 MG tablet, Take 1 tablet (324 mg total) by mouth 2 (two) times daily with a meal., Disp: , Rfl: 3   PARoxetine (PAXIL-CR) 25 MG 24 hr tablet, Take 25 mg by mouth every morning., Disp: , Rfl:    HYDROcodone-acetaminophen (NORCO) 5-325 MG tablet, Take 1 tablet by mouth every 6 (six) hours as needed for up to 6 doses for severe pain., Disp: 6 tablet, Rfl: 0   ondansetron (ZOFRAN-ODT) 4 MG disintegrating tablet, Take 1 tablet (4 mg total) by mouth every 8 (eight) hours as needed for nausea or vomiting., Disp: 20 tablet, Rfl: 0   pantoprazole (PROTONIX) 40 MG tablet, Take 1 tablet (40 mg total) by mouth 2 (two) times daily., Disp: 60 tablet, Rfl: 0  Past Medical Problems: Past Medical History:  Diagnosis Date   Allergy    Anxiety    Arthritis    Blood transfusion without reported diagnosis    as baby   Breast cancer ([place]) 1999-2000   s/p lumpectomy and radiation, no chemo-right   History of chicken pox    History of hip replacement    Hot flashes    treated wtih paxil   Hyperlipidemia    Osteoporosis    DXA 11/12/17   UTI (urinary tract infection)     Past Surgical History: Past Surgical History:  Procedure Laterality Date   APPLICATION OF WOUND VAC Right 11/08/2020   Procedure: APPLICATION OF WOUND VAC;  Surgeon: [name], MD;  Location: [facility];  Service: Plastics;   Laterality: Right;   BREAST SURGERY Right    CATARACT EXTRACTION W/PHACO Left 12/28/2014   Procedure: CATARACT EXTRACTION PHACO AND INTRAOCULAR LENS PLACEMENT ([place]);  Surgeon: Birder Robson, MD;  Location: [facility];  Service: Ophthalmology;  Laterality: Left;  [place] 00:38 AP% 19.9 CDE 7.70 fluid pack lot # [id number]   CATARACT EXTRACTION W/PHACO Right 07/10/2016   Procedure: CATARACT EXTRACTION PHACO AND INTRAOCULAR LENS PLACEMENT ([facility]);  Surgeon: Birder Robson, MD;  Location: [facility];  Service: Ophthalmology;  Laterality: Right;  [place] 00:38 AP% 18.3 CDE 7.06 Fluid pack lot # [id number]   COLONOSCOPY  2016   DEBRIDEMENT AND CLOSURE WOUND Right 11/01/2020   Procedure: DEBRIDEMENT RIGHT FOOT WOUND;  Surgeon: [name], MD;  Location: [facility];  Service: Plastics;  Laterality: Right;  60 minutes   DEBRIDEMENT AND CLOSURE WOUND Right 11/08/2020   Procedure: Debridement of r. foot wound;  Surgeon: [name], MD;  [facility];  Service: Plastics;  Laterality: Right;   EYE SURGERY     JOINT REPLACEMENT Bilateral    hip  / shoulder   OOPHORECTOMY Left    POLYPECTOMY     TOTAL HIP ARTHROPLASTY Bilateral    (2) Two   TOTAL SHOULDER REPLACEMENT      Social History: Social History   Socioeconomic History   Marital status: Married    Spouse name: Not on file   Number of children: Not on file   Years of education: Not on file   Highest education level: Not on file  Occupational History   Not on file  Tobacco Use   Smoking status: Never   Smokeless tobacco: Never  Vaping Use   Vaping Use: Never used  Substance and Sexual Activity   Alcohol use: No    Comment: rare once a year   Drug use: No   Sexual activity: Not Currently  Other Topics Concern   Not on file  Social History Narrative   Married 1963   Likes to travel   2 daughters   Social Determinants of Radio broadcast assistant Strain: Not on file  Food Insecurity: No Food Insecurity   Worried About Paediatric nurse in the Last Year: Never true   Arboriculturist in the Last Year: Never true  Transportation Needs: No Transportation Needs   Lack of Transportation (Medical): No   Lack of Transportation (Non-Medical): No  Physical Activity: Not on file  Stress: Not on file  Social Connections: Not on file  Intimate Partner Violence: Not on file    Family History: Family History  Problem Relation Age of Onset   Lung cancer Mother    Heart disease Mother    Colon cancer Mother  late 93's   Cancer Father    Colon cancer Father 48       died at 37   Breast cancer Maternal Aunt    Breast cancer Maternal Aunt    Breast cancer Maternal Aunt    Rectal cancer Neg Hx    Stomach cancer Neg Hx    Crohn's disease Neg Hx    Esophageal cancer Neg Hx     Review of Systems: ROS Endorses nasal congestion last week, improved.  Denies fevers, chills, or other symptoms.  Physical Exam: Vital Signs BP 119/82 (BP Location: Left Arm, Patient Position: Sitting, Cuff Size: Small)   Pulse 97   Ht 5\' 3"  (1.6 m)   Wt 143 lb 12.8 oz (65.2 kg)   SpO2 98%   BMI 25.47 kg/m   Physical Exam  Constitutional:      General: Not in acute distress.    Appearance: Normal appearance. Not ill-appearing.  HENT:     Head: Normocephalic and atraumatic.  Eyes:     Pupils: Pupils are equal, round Neck:     Musculoskeletal: Normal range of motion.  Cardiovascular:     Rate and Rhythm: Normal rate    Pulses: Normal pulses.  Pulmonary:     Effort: Pulmonary effort is normal. No respiratory distress.  Abdominal:     General: Abdomen is flat. There is no distension.  Musculoskeletal: Normal range of motion.  Right lower leg in walking boot.  Left leg without any swelling or evidence of varicosities. Skin:    General: Skin is warm and dry.     Findings: No erythema or rash.  Neurological:     General: No focal deficit present.     Mental Status: Alert and oriented to person, place, and time. Mental  status is at baseline.     Motor: No weakness.  Psychiatric:        Mood and Affect: Mood normal.        Behavior: Behavior normal.   Assessment/Plan: The patient is scheduled for STSG 01/13/2021 with Dr. Claudia Desanctis.  Risks, benefits, and alternatives of procedure discussed, questions answered and consent obtained.    Smoking Status: Non-smoker.  Caprini Score: 9; Risk Factors include: Age, personal history of malignancy, immobilization, BMI greater than 25, and length of planned surgery. Recommendation for mechanical and possibly pharmacologic prophylaxis.  Will discuss with surgeon.  Pictures obtained: 12/28/2020.  Post-op Rx sent to pharmacy: Zofran and Norco #8 pills.    Patient was provided with the General Surgical Risk consent document and Pain Medication Agreement prior to their appointment.  They had adequate time to read through the risk consent documents and Pain Medication Agreement. We also discussed them in person together during this preop appointment. All of their questions were answered to their satisfaction.  Recommended calling if they have any further questions.  Risk consent form and Pain Medication Agreement to be scanned into patient's chart.   Electronically signed by: Krista Blue, PA-C 01/09/2021 11:17 AM

## 2021-01-10 ENCOUNTER — Other Ambulatory Visit (HOSPITAL_COMMUNITY): Payer: Self-pay

## 2021-01-11 DIAGNOSIS — K219 Gastro-esophageal reflux disease without esophagitis: Secondary | ICD-10-CM | POA: Diagnosis not present

## 2021-01-11 DIAGNOSIS — A419 Sepsis, unspecified organism: Secondary | ICD-10-CM | POA: Diagnosis not present

## 2021-01-11 DIAGNOSIS — I10 Essential (primary) hypertension: Secondary | ICD-10-CM | POA: Diagnosis not present

## 2021-01-11 DIAGNOSIS — L03115 Cellulitis of right lower limb: Secondary | ICD-10-CM | POA: Diagnosis not present

## 2021-01-11 DIAGNOSIS — F32A Depression, unspecified: Secondary | ICD-10-CM | POA: Diagnosis not present

## 2021-01-11 DIAGNOSIS — M94261 Chondromalacia, right knee: Secondary | ICD-10-CM | POA: Diagnosis not present

## 2021-01-13 ENCOUNTER — Ambulatory Visit (HOSPITAL_BASED_OUTPATIENT_CLINIC_OR_DEPARTMENT_OTHER): Payer: Medicare Other | Admitting: Certified Registered"

## 2021-01-13 ENCOUNTER — Other Ambulatory Visit: Payer: Self-pay

## 2021-01-13 ENCOUNTER — Ambulatory Visit (HOSPITAL_BASED_OUTPATIENT_CLINIC_OR_DEPARTMENT_OTHER)
Admission: RE | Admit: 2021-01-13 | Discharge: 2021-01-13 | Disposition: A | Discharge status: Home / Self Care | Payer: Medicare Other | Attending: Plastic Surgery | Admitting: Plastic Surgery

## 2021-01-13 ENCOUNTER — Encounter (HOSPITAL_BASED_OUTPATIENT_CLINIC_OR_DEPARTMENT_OTHER)
Admission: RE | Disposition: A | Discharge status: Home / Self Care | Payer: Self-pay | Source: Home / Self Care | Attending: Plastic Surgery

## 2021-01-13 ENCOUNTER — Encounter (HOSPITAL_BASED_OUTPATIENT_CLINIC_OR_DEPARTMENT_OTHER): Payer: Self-pay | Admitting: Plastic Surgery

## 2021-01-13 DIAGNOSIS — Z79899 Other long term (current) drug therapy: Secondary | ICD-10-CM | POA: Diagnosis not present

## 2021-01-13 DIAGNOSIS — S91301D Unspecified open wound, right foot, subsequent encounter: Secondary | ICD-10-CM | POA: Insufficient documentation

## 2021-01-13 DIAGNOSIS — X58XXXD Exposure to other specified factors, subsequent encounter: Secondary | ICD-10-CM | POA: Diagnosis not present

## 2021-01-13 DIAGNOSIS — I10 Essential (primary) hypertension: Secondary | ICD-10-CM | POA: Diagnosis not present

## 2021-01-13 DIAGNOSIS — K219 Gastro-esophageal reflux disease without esophagitis: Secondary | ICD-10-CM | POA: Insufficient documentation

## 2021-01-13 DIAGNOSIS — S91301A Unspecified open wound, right foot, initial encounter: Secondary | ICD-10-CM | POA: Diagnosis not present

## 2021-01-13 HISTORY — PX: SKIN SPLIT GRAFT: SHX444

## 2021-01-13 SURGERY — APPLICATION, GRAFT, SKIN, SPLIT-THICKNESS
Anesthesia: General | Site: Foot | Laterality: Right

## 2021-01-13 MED ORDER — CHLORHEXIDINE GLUCONATE CLOTH 2 % EX PADS: 6.0000 | MEDICATED_PAD | Freq: Once | CUTANEOUS | Status: DC

## 2021-01-13 MED ORDER — FENTANYL CITRATE (PF) 100 MCG/2ML IJ SOLN
INTRAMUSCULAR | Status: AC
  Filled 2021-01-13: qty 2

## 2021-01-13 MED ORDER — PROPOFOL 10 MG/ML IV BOLUS
INTRAVENOUS | Status: AC
  Filled 2021-01-13: qty 20

## 2021-01-13 MED ORDER — LACTATED RINGERS IV SOLN: INTRAVENOUS | Status: DC | PRN

## 2021-01-13 MED ORDER — PROMETHAZINE HCL 25 MG/ML IJ SOLN: 6.2500 mg | INTRAMUSCULAR | Status: DC | PRN

## 2021-01-13 MED ORDER — PHENYLEPHRINE HCL (PRESSORS) 10 MG/ML IV SOLN
INTRAVENOUS | Status: DC | PRN
  Administered 2021-01-13 (×3): 120 ug via INTRAVENOUS

## 2021-01-13 MED ORDER — MINERAL OIL LIGHT OIL
TOPICAL_OIL | Status: AC
  Filled 2021-01-13: qty 10

## 2021-01-13 MED ORDER — LIDOCAINE 2% (20 MG/ML) 5 ML SYRINGE
INTRAMUSCULAR | Status: AC
  Filled 2021-01-13: qty 5

## 2021-01-13 MED ORDER — EPHEDRINE SULFATE 50 MG/ML IJ SOLN
INTRAMUSCULAR | Status: DC | PRN
  Administered 2021-01-13: 15 mg via INTRAVENOUS

## 2021-01-13 MED ORDER — LIDOCAINE-EPINEPHRINE 1 %-1:100000 IJ SOLN
INTRAMUSCULAR | Status: AC
  Filled 2021-01-13: qty 1

## 2021-01-13 MED ORDER — BUPIVACAINE-EPINEPHRINE (PF) 0.25% -1:200000 IJ SOLN
INTRAMUSCULAR | Status: AC
  Filled 2021-01-13: qty 30

## 2021-01-13 MED ORDER — OXYCODONE HCL 5 MG/5ML PO SOLN: 5.0000 mg | Freq: Once | ORAL | Status: DC | PRN

## 2021-01-13 MED ORDER — CEFAZOLIN SODIUM-DEXTROSE 2-4 GM/100ML-% IV SOLN: 2.0000 g | INTRAVENOUS | Status: DC

## 2021-01-13 MED ORDER — CEFAZOLIN SODIUM-DEXTROSE 2-3 GM-%(50ML) IV SOLR
INTRAVENOUS | Status: DC | PRN
  Administered 2021-01-13: 2 g via INTRAVENOUS

## 2021-01-13 MED ORDER — DEXAMETHASONE SODIUM PHOSPHATE 10 MG/ML IJ SOLN
INTRAMUSCULAR | Status: DC | PRN
  Administered 2021-01-13: 5 mg via INTRAVENOUS

## 2021-01-13 MED ORDER — DEXAMETHASONE SODIUM PHOSPHATE 10 MG/ML IJ SOLN
INTRAMUSCULAR | Status: AC
  Filled 2021-01-13: qty 1

## 2021-01-13 MED ORDER — ONDANSETRON HCL 4 MG/2ML IJ SOLN
INTRAMUSCULAR | Status: AC
  Filled 2021-01-13: qty 2

## 2021-01-13 MED ORDER — LIDOCAINE HCL (CARDIAC) PF 100 MG/5ML IV SOSY
PREFILLED_SYRINGE | INTRAVENOUS | Status: DC | PRN
  Administered 2021-01-13: 100 mg via INTRAVENOUS

## 2021-01-13 MED ORDER — ONDANSETRON HCL 4 MG/2ML IJ SOLN
INTRAMUSCULAR | Status: DC | PRN
  Administered 2021-01-13: 4 mg via INTRAVENOUS

## 2021-01-13 MED ORDER — OXYCODONE HCL 5 MG PO TABS: 5.0000 mg | ORAL_TABLET | Freq: Once | ORAL | Status: DC | PRN

## 2021-01-13 MED ORDER — EPINEPHRINE 1 MG/10ML IJ SOSY
PREFILLED_SYRINGE | INTRAMUSCULAR | Status: DC | PRN
  Administered 2021-01-13: 0.5 mg

## 2021-01-13 MED ORDER — PROPOFOL 10 MG/ML IV BOLUS
INTRAVENOUS | Status: DC | PRN
  Administered 2021-01-13 (×3): 20 mg via INTRAVENOUS
  Administered 2021-01-13: 140 mg via INTRAVENOUS
  Administered 2021-01-13: 20 mg via INTRAVENOUS

## 2021-01-13 MED ORDER — LACTATED RINGERS IV SOLN: INTRAVENOUS | Status: DC

## 2021-01-13 MED ORDER — BUPIVACAINE HCL (PF) 0.25 % IJ SOLN
INTRAMUSCULAR | Status: DC | PRN
  Administered 2021-01-13: 15 mL

## 2021-01-13 MED ORDER — CEFAZOLIN SODIUM-DEXTROSE 2-4 GM/100ML-% IV SOLN
INTRAVENOUS | Status: AC
  Filled 2021-01-13: qty 100

## 2021-01-13 MED ORDER — AMISULPRIDE (ANTIEMETIC) 5 MG/2ML IV SOLN: 10.0000 mg | Freq: Once | INTRAVENOUS | Status: DC | PRN

## 2021-01-13 MED ORDER — BUPIVACAINE HCL (PF) 0.25 % IJ SOLN
INTRAMUSCULAR | Status: AC
  Filled 2021-01-13: qty 30

## 2021-01-13 MED ORDER — EPINEPHRINE PF 1 MG/ML IJ SOLN
INTRAMUSCULAR | Status: AC
  Filled 2021-01-13: qty 1

## 2021-01-13 MED ORDER — FENTANYL CITRATE (PF) 100 MCG/2ML IJ SOLN
INTRAMUSCULAR | Status: DC | PRN
  Administered 2021-01-13: 50 ug via INTRAVENOUS
  Administered 2021-01-13 (×2): 25 ug via INTRAVENOUS

## 2021-01-13 MED ORDER — HYDROMORPHONE HCL 1 MG/ML IJ SOLN: 0.2500 mg | INTRAMUSCULAR | Status: DC | PRN

## 2021-01-13 MED ORDER — LACTATED RINGERS IV SOLN
INTRAVENOUS | Status: AC | PRN
  Administered 2021-01-13: 500 mL via INTRAVENOUS

## 2021-01-13 MED ORDER — MINERAL OIL LIGHT 100 % EX OIL
TOPICAL_OIL | CUTANEOUS | Status: DC | PRN
  Administered 2021-01-13: 1 via TOPICAL

## 2021-01-13 SURGICAL SUPPLY — 84 items
ADH SKN CLS APL DERMABOND .7 (GAUZE/BANDAGES/DRESSINGS)
APL SKNCLS STERI-STRIP NONHPOA (GAUZE/BANDAGES/DRESSINGS)
BALL CTTN LRG ABS STRL LF (GAUZE/BANDAGES/DRESSINGS)
BENZOIN TINCTURE PRP APPL 2/3 (GAUZE/BANDAGES/DRESSINGS) IMPLANT
BLADE CLIPPER SURG (BLADE) IMPLANT
BLADE DERMATOME SS (BLADE) IMPLANT
BLADE SURG 10 STRL SS (BLADE) ×3 IMPLANT
BLADE SURG 15 STRL LF DISP TIS (BLADE) ×2 IMPLANT
BLADE SURG 15 STRL SS (BLADE) ×3
BNDG COHESIVE 4X5 TAN ST LF (GAUZE/BANDAGES/DRESSINGS) ×3 IMPLANT
BNDG ELASTIC 3X5.8 VLCR STR LF (GAUZE/BANDAGES/DRESSINGS) IMPLANT
BNDG ELASTIC 4X5.8 VLCR STR LF (GAUZE/BANDAGES/DRESSINGS) ×6 IMPLANT
BNDG ELASTIC 6X5.8 VLCR STR LF (GAUZE/BANDAGES/DRESSINGS) IMPLANT
BNDG GAUZE ELAST 4 BULKY (GAUZE/BANDAGES/DRESSINGS) ×6 IMPLANT
CANISTER SUCT 1200ML W/VALVE (MISCELLANEOUS) ×3 IMPLANT
COTTONBALL LRG STERILE PKG (GAUZE/BANDAGES/DRESSINGS) IMPLANT
COVER BACK TABLE 60X90IN (DRAPES) ×3 IMPLANT
COVER MAYO STAND STRL (DRAPES) IMPLANT
DECANTER SPIKE VIAL GLASS SM (MISCELLANEOUS) IMPLANT
DEPRESSOR TONGUE BLADE STERILE (MISCELLANEOUS) ×3 IMPLANT
DERMABOND ADVANCED (GAUZE/BANDAGES/DRESSINGS)
DERMABOND ADVANCED .7 DNX12 (GAUZE/BANDAGES/DRESSINGS) IMPLANT
DERMACARRIERS GRAFT 1 TO 1.5 (DISPOSABLE) ×3
DRAPE INCISE IOBAN 66X45 STRL (DRAPES) ×3 IMPLANT
DRAPE LAPAROTOMY 100X72 PEDS (DRAPES) IMPLANT
DRAPE SURG 17X23 STRL (DRAPES) IMPLANT
DRAPE U-SHAPE 76X120 STRL (DRAPES) IMPLANT
DRAPE UTILITY XL STRL (DRAPES) ×3 IMPLANT
DRSG ADAPTIC 3X8 NADH LF (GAUZE/BANDAGES/DRESSINGS) IMPLANT
DRSG CALCIUM ALGINATE 4X4 (GAUZE/BANDAGES/DRESSINGS) ×6 IMPLANT
DRSG EMULSION OIL 3X3 NADH (GAUZE/BANDAGES/DRESSINGS) IMPLANT
DRSG MEPITEL 4X7.2 (GAUZE/BANDAGES/DRESSINGS) ×6 IMPLANT
DRSG PAD ABDOMINAL 8X10 ST (GAUZE/BANDAGES/DRESSINGS) ×6 IMPLANT
ELECT COATED BLADE 2.86 ST (ELECTRODE) IMPLANT
ELECT NEEDLE BLADE 2-5/6 (NEEDLE) IMPLANT
ELECT REM PT RETURN 9FT ADLT (ELECTROSURGICAL)
ELECTRODE REM PT RTRN 9FT ADLT (ELECTROSURGICAL) IMPLANT
GAUZE SPONGE 4X4 12PLY STRL (GAUZE/BANDAGES/DRESSINGS) IMPLANT
GAUZE SPONGE 4X4 12PLY STRL LF (GAUZE/BANDAGES/DRESSINGS) IMPLANT
GAUZE XEROFORM 1X8 LF (GAUZE/BANDAGES/DRESSINGS) IMPLANT
GAUZE XEROFORM 5X9 LF (GAUZE/BANDAGES/DRESSINGS) ×6 IMPLANT
GLOVE SRG 8 PF TXTR STRL LF DI (GLOVE) ×2 IMPLANT
GLOVE SURG ENC MOIS LTX SZ7.5 (GLOVE) ×3 IMPLANT
GLOVE SURG ENC TEXT LTX SZ7.5 (GLOVE) ×3 IMPLANT
GLOVE SURG POLYISO LF SZ6.5 (GLOVE) ×6 IMPLANT
GLOVE SURG UNDER POLY LF SZ7 (GLOVE) ×3 IMPLANT
GLOVE SURG UNDER POLY LF SZ8 (GLOVE) ×3
GOWN STRL REUS W/ TWL LRG LVL3 (GOWN DISPOSABLE) ×6 IMPLANT
GOWN STRL REUS W/TWL LRG LVL3 (GOWN DISPOSABLE) ×9
GRAFT DERMACARRIERS 1 TO 1.5 (DISPOSABLE) ×2 IMPLANT
HYDROGEN PEROXIDE 16OZ (MISCELLANEOUS) IMPLANT
NEEDLE HYPO 25X1 1.5 SAFETY (NEEDLE) ×3 IMPLANT
NS IRRIG 1000ML POUR BTL (IV SOLUTION) ×3 IMPLANT
PACK BASIN DAY SURGERY FS (CUSTOM PROCEDURE TRAY) ×3 IMPLANT
PAD CAST 3X4 CTTN HI CHSV (CAST SUPPLIES) IMPLANT
PAD CAST 4YDX4 CTTN HI CHSV (CAST SUPPLIES) IMPLANT
PADDING CAST COTTON 3X4 STRL (CAST SUPPLIES)
PADDING CAST COTTON 4X4 STRL (CAST SUPPLIES)
PENCIL SMOKE EVACUATOR (MISCELLANEOUS) IMPLANT
SHEET MEDIUM DRAPE 40X70 STRL (DRAPES) IMPLANT
SPONGE T-LAP 18X18 ~~LOC~~+RFID (SPONGE) ×6 IMPLANT
STAPLER VISISTAT 35W (STAPLE) ×3 IMPLANT
STOCKINETTE 4X48 STRL (DRAPES) IMPLANT
STOCKINETTE 6  STRL (DRAPES)
STOCKINETTE 6 STRL (DRAPES) IMPLANT
STOCKINETTE IMPERVIOUS LG (DRAPES) ×3 IMPLANT
STRIP CLOSURE SKIN 1/2X4 (GAUZE/BANDAGES/DRESSINGS) IMPLANT
SURGILUBE 2OZ TUBE FLIPTOP (MISCELLANEOUS) IMPLANT
SUT CHROMIC 4 0 PS 2 18 (SUTURE) IMPLANT
SUT CHROMIC 5 0 P 3 (SUTURE) IMPLANT
SUT MNCRL AB 4-0 PS2 18 (SUTURE) IMPLANT
SUT SILK 3 0 SH CR/8 (SUTURE) IMPLANT
SUT SILK 4 0 PS 2 (SUTURE) IMPLANT
SUT VIC AB 5-0 P-3 18X BRD (SUTURE) IMPLANT
SUT VIC AB 5-0 P3 18 (SUTURE)
SYR 20ML LL LF (SYRINGE) ×6 IMPLANT
SYR 50ML LL SCALE MARK (SYRINGE) ×3 IMPLANT
SYR BULB EAR ULCER 3OZ GRN STR (SYRINGE) ×3 IMPLANT
SYR CONTROL 10ML LL (SYRINGE) ×3 IMPLANT
TOWEL GREEN STERILE FF (TOWEL DISPOSABLE) ×3 IMPLANT
TRAY DSU PREP LF (CUSTOM PROCEDURE TRAY) ×3 IMPLANT
TUBE CONNECTING 20X1/4 (TUBING) IMPLANT
UNDERPAD 30X36 HEAVY ABSORB (UNDERPADS AND DIAPERS) ×3 IMPLANT
YANKAUER SUCT BULB TIP NO VENT (SUCTIONS) IMPLANT

## 2021-01-13 NOTE — Anesthesia Procedure Notes (Signed)
Procedure Name: LMA Insertion Date/Time: 01/13/2021 2:49 PM Performed by: Verita Lamb, CRNA Pre-anesthesia Checklist: Patient identified, Emergency Drugs available, Suction available and Patient being monitored Patient Re-evaluated:Patient Re-evaluated prior to induction Oxygen Delivery Method: Circle system utilized Preoxygenation: Pre-oxygenation with 100% oxygen Induction Type: IV induction Ventilation: Mask ventilation without difficulty LMA: LMA inserted LMA Size: 4.0 Number of attempts: 1 Airway Equipment and Method: Bite block Placement Confirmation: positive ETCO2, CO2 detector and breath sounds checked- equal and bilateral Tube secured with: Tape Dental Injury: Teeth and Oropharynx as per pre-operative assessment

## 2021-01-13 NOTE — Interval H&P Note (Signed)
History and Physical Interval Note:  01/13/2021 11:47 AM  [name] Price  has presented today for surgery, with the diagnosis of Open wound of right foot.  The various methods of treatment have been discussed with the patient and family. After consideration of risks, benefits and other options for treatment, the patient has consented to  Procedure(s): SKIN GRAFT SPLIT THICKNESS (Right) APPLICATION OF WOUND VAC (Right) as a surgical intervention.  The patient's history has been reviewed, patient examined, no change in status, stable for surgery.  I have reviewed the patient's chart and labs.  Questions were answered to the patient's satisfaction.     [name]

## 2021-01-13 NOTE — Anesthesia Postprocedure Evaluation (Signed)
Anesthesia Post Note  Patient: [name] Price  Procedure(s) Performed: SKIN GRAFT SPLIT THICKNESS (Right: Foot)     Patient location during evaluation: PACU Anesthesia Type: General Level of consciousness: awake Pain management: pain level controlled Vital Signs Assessment: post-procedure vital signs reviewed and stable Respiratory status: spontaneous breathing Postop Assessment: no apparent nausea or vomiting Anesthetic complications: no   No notable events documented.  Last Vitals:  Vitals:   01/13/21 1527 01/13/21 1530  BP: 138/64 (!) 141/65  Pulse: (!) 117 (!) 118  Resp: 16 (!) 22  Temp: 36.9 C   SpO2: 95% 97%    Last Pain:  Vitals:   01/13/21 1527  TempSrc:   PainSc: 0-No pain                 Anira Senegal

## 2021-01-13 NOTE — Anesthesia Preprocedure Evaluation (Signed)
Anesthesia Evaluation  Patient identified by MRN, date of birth, ID band Patient awake    Reviewed: Allergy & Precautions, NPO status , Patient's Chart, lab work & pertinent test results, reviewed documented beta blocker date and time   History of Anesthesia Complications Negative for: history of anesthetic complications  Airway Mallampati: II  TM Distance: >3 FB Neck ROM: Full    Dental  (+) Edentulous Upper, Partial Lower, Dental Advisory Given   Pulmonary neg pulmonary ROS,    breath sounds clear to auscultation       Cardiovascular hypertension, Pt. on medications and Pt. on home beta blockers (-) angina Rhythm:Regular Rate:Normal  10/16/2020 ECHO: EF 60-65%, normal LVF, mild MR, mild-mod TR   Neuro/Psych Anxiety Depression negative neurological ROS     GI/Hepatic Neg liver ROS, GERD  ,  Endo/Other  negative endocrine ROS  Renal/GU negative Renal ROS     Musculoskeletal  (+) Arthritis , Osteoarthritis,    Abdominal   Peds  Hematology  (+) Blood dyscrasia (Hb 9.6), anemia ,   Anesthesia Other Findings H/o breast cancer  Reproductive/Obstetrics                             Anesthesia Physical  Anesthesia Plan  ASA: 2  Anesthesia Plan: General   Post-op Pain Management:    Induction: Intravenous  PONV Risk Score and Plan: 3 and Ondansetron, Dexamethasone, Treatment may vary due to age or medical condition and Midazolam  Airway Management Planned: LMA  Additional Equipment: None  Intra-op Plan:   Post-operative Plan:   Informed Consent: I have reviewed the patients History and Physical, chart, labs and discussed the procedure including the risks, benefits and alternatives for the proposed anesthesia with the patient or authorized representative who has indicated his/her understanding and acceptance.     Dental advisory given  Plan Discussed with: CRNA and  Surgeon  Anesthesia Plan Comments:         Anesthesia Quick Evaluation

## 2021-01-13 NOTE — Op Note (Signed)
Operative Note   DATE OF OPERATION: 01/13/2021  SURGICAL DEPARTMENT: Plastic Surgery  PREOPERATIVE DIAGNOSES: Right foot wound  POSTOPERATIVE DIAGNOSES:  same  PROCEDURE: 1.  Surgical preparation for grafting right foot wound totaling 20 x 8 cm 2.  Split-thickness skin graft to right foot wound totaling 20 x 8 cm  SURGEON: Talmadge Coventry, MD  ASSISTANT: Verdie Shire, PA The advanced practice practitioner (APP) assisted throughout the case.  The APP was essential in retraction and counter traction when needed to make the case progress smoothly.  This retraction and assistance made it possible to see the tissue plans for the procedure.  The assistance was needed for blood control, tissue re-approximation and assisted with closure of the incision site.  ANESTHESIA:  General.   COMPLICATIONS: None.   INDICATIONS FOR PROCEDURE:  The patient, [name] Price is a 80 y.o. female born on [birth year], is here for treatment of right foot wound.  I had previously placed Integra and this is granulating nicely.  Plan for split-thickness skin graft today. MRN: [id number]  CONSENT:  Informed consent was obtained directly from the patient. Risks, benefits and alternatives were fully discussed. Specific risks including but not limited to bleeding, infection, hematoma, seroma, scarring, pain, contracture, asymmetry, wound healing problems, and need for further surgery were all discussed. The patient did have an ample opportunity to have questions answered to satisfaction.   DESCRIPTION OF PROCEDURE:  The patient was taken to the operating room. SCDs were placed and antibiotics were given.  General anesthesia was administered.  The patient's operative site was prepped and draped in a sterile fashion. A time out was performed and all information was confirmed to be correct.  Started by inspecting the wound.  This had nice healthy granulation tissue throughout.  This was debrided with the bevel of a 10  blade.  It was irrigated copiously with saline.  Hemostasis was obtained with cautery and tumescent soaked laps.  It measured 20 x 8 cm.  I then turned my attention to the right thigh.  Tumescent solution was infiltrated.  This was given time to work.  A split-thickness skin graft was then harvested at fourteen/1000th inch and meshed 1.5-1.  This was then inset with staples after ensuring hemostasis in the wound.  Mepitel was stapled over that followed by Xeroform and gauze wrap dressing.  The thigh was dressed with Melgisorb and Ioban.  The patient tolerated the procedure well.  There were no complications. The patient was allowed to wake from anesthesia, extubated and taken to the recovery room in satisfactory condition.

## 2021-01-13 NOTE — Discharge Instructions (Addendum)
Activity As tolerated: NO showers or getting the right leg wet. NO driving No heavy activities  Diet: Regular  Wound Care: Keep dressing clean & dry.  Do not remove your boot or the dressing on your right lower extremity.  We will remove this at your first postop follow-up to evaluate the skin graft.  Leave boot on 24/7. Do not change dressings  Call Doctor if any unusual problems occur such as pain, excessive Bleeding, unrelieved Nausea/vomiting, Fever &/or chills   Follow-up appointment: Scheduled for next week.  Post Anesthesia Home Care Instructions  Activity: Get plenty of rest for the remainder of the day. A responsible individual must stay with you for 24 hours following the procedure.  For the next 24 hours, DO NOT: -Drive a car -Paediatric nurse -Drink alcoholic beverages -Take any medication unless instructed by your physician -Make any legal decisions or sign important papers.  Meals: Start with liquid foods such as gelatin or soup. Progress to regular foods as tolerated. Avoid greasy, spicy, heavy foods. If nausea and/or vomiting occur, drink only clear liquids until the nausea and/or vomiting subsides. Call your physician if vomiting continues.  Special Instructions/Symptoms: Your throat may feel dry or sore from the anesthesia or the breathing tube placed in your throat during surgery. If this causes discomfort, gargle with warm salt water. The discomfort should disappear within 24 hours.  If you had a scopolamine patch placed behind your ear for the management of post- operative nausea and/or vomiting:  1. The medication in the patch is effective for 72 hours, after which it should be removed.  Wrap patch in a tissue and discard in the trash. Wash hands thoroughly with soap and water. 2. You may remove the patch earlier than 72 hours if you experience unpleasant side effects which may include dry mouth, dizziness or visual disturbances. 3. Avoid touching the patch.  Wash your hands with soap and water after contact with the patch.

## 2021-01-13 NOTE — Transfer of Care (Signed)
Immediate Anesthesia Transfer of Care Note  Patient: [name] Price  Procedure(s) Performed: SKIN GRAFT SPLIT THICKNESS (Right: Foot)  Patient Location: PACU  Anesthesia Type:General  Level of Consciousness: drowsy  Airway & Oxygen Therapy: Patient Spontanous Breathing and Patient connected to face mask oxygen  Post-op Assessment: Report given to RN and Post -op Vital signs reviewed and stable  Post vital signs: Reviewed and stable  Last Vitals:  Vitals Value Taken Time  BP 138/64 01/13/21 1526  Temp    Pulse 118 01/13/21 1530  Resp 22 01/13/21 1530  SpO2 91 % 01/13/21 1530  Vitals shown include unvalidated device data.  Last Pain:  Vitals:   01/13/21 1001  TempSrc: Oral  PainSc: 2       Patients Stated Pain Goal: 2 (42/87/68 1157)  Complications: No notable events documented.

## 2021-01-16 ENCOUNTER — Telehealth: Payer: Self-pay | Admitting: Plastic Surgery

## 2021-01-16 ENCOUNTER — Encounter (HOSPITAL_BASED_OUTPATIENT_CLINIC_OR_DEPARTMENT_OTHER): Payer: Self-pay | Admitting: Plastic Surgery

## 2021-01-16 DIAGNOSIS — L03115 Cellulitis of right lower limb: Secondary | ICD-10-CM | POA: Diagnosis not present

## 2021-01-16 DIAGNOSIS — A419 Sepsis, unspecified organism: Secondary | ICD-10-CM | POA: Diagnosis not present

## 2021-01-16 DIAGNOSIS — K219 Gastro-esophageal reflux disease without esophagitis: Secondary | ICD-10-CM | POA: Diagnosis not present

## 2021-01-16 DIAGNOSIS — M94261 Chondromalacia, right knee: Secondary | ICD-10-CM | POA: Diagnosis not present

## 2021-01-16 DIAGNOSIS — I10 Essential (primary) hypertension: Secondary | ICD-10-CM | POA: Diagnosis not present

## 2021-01-16 DIAGNOSIS — F32A Depression, unspecified: Secondary | ICD-10-CM | POA: Diagnosis not present

## 2021-01-16 NOTE — Telephone Encounter (Signed)
Katharine Look with home care called and stated she went to check on patient today and noticed that donor dressing was completely saturated. She also said she couldn't see any orders for the patient at the time either.   Katharine Look said that she only rewrapped and replaced the gauze that had fallen off because it was saturating the bed. Graft site looks good so she didn't have to do anything with it.  Katharine Look was unsure how much drainage was acceptable to Dr. Claudia Desanctis.  Follow Up

## 2021-01-17 NOTE — Telephone Encounter (Signed)
Called pt and informed her of Matt's recommendations. She conveyed understanding and is aware of her appt on 01/19/21.

## 2021-01-17 NOTE — Telephone Encounter (Signed)
Called [name] Price and informed her of Matt's recommendations. She conveyed understanding and is aware of pt's appt on 01/19/21.

## 2021-01-18 DIAGNOSIS — A419 Sepsis, unspecified organism: Secondary | ICD-10-CM | POA: Diagnosis not present

## 2021-01-18 DIAGNOSIS — I10 Essential (primary) hypertension: Secondary | ICD-10-CM | POA: Diagnosis not present

## 2021-01-18 DIAGNOSIS — M94261 Chondromalacia, right knee: Secondary | ICD-10-CM | POA: Diagnosis not present

## 2021-01-18 DIAGNOSIS — F32A Depression, unspecified: Secondary | ICD-10-CM | POA: Diagnosis not present

## 2021-01-18 DIAGNOSIS — L03115 Cellulitis of right lower limb: Secondary | ICD-10-CM | POA: Diagnosis not present

## 2021-01-18 DIAGNOSIS — K219 Gastro-esophageal reflux disease without esophagitis: Secondary | ICD-10-CM | POA: Diagnosis not present

## 2021-01-19 ENCOUNTER — Ambulatory Visit (INDEPENDENT_AMBULATORY_CARE_PROVIDER_SITE_OTHER): Payer: Medicare Other | Admitting: Plastic Surgery

## 2021-01-19 ENCOUNTER — Other Ambulatory Visit: Payer: Self-pay

## 2021-01-19 DIAGNOSIS — Z853 Personal history of malignant neoplasm of breast: Secondary | ICD-10-CM | POA: Diagnosis not present

## 2021-01-19 DIAGNOSIS — I10 Essential (primary) hypertension: Secondary | ICD-10-CM | POA: Diagnosis not present

## 2021-01-19 DIAGNOSIS — A419 Sepsis, unspecified organism: Secondary | ICD-10-CM | POA: Diagnosis not present

## 2021-01-19 DIAGNOSIS — K5730 Diverticulosis of large intestine without perforation or abscess without bleeding: Secondary | ICD-10-CM | POA: Diagnosis not present

## 2021-01-19 DIAGNOSIS — D649 Anemia, unspecified: Secondary | ICD-10-CM | POA: Diagnosis not present

## 2021-01-19 DIAGNOSIS — L03115 Cellulitis of right lower limb: Secondary | ICD-10-CM | POA: Diagnosis not present

## 2021-01-19 DIAGNOSIS — M810 Age-related osteoporosis without current pathological fracture: Secondary | ICD-10-CM | POA: Diagnosis not present

## 2021-01-19 DIAGNOSIS — K219 Gastro-esophageal reflux disease without esophagitis: Secondary | ICD-10-CM | POA: Diagnosis not present

## 2021-01-19 DIAGNOSIS — F32A Depression, unspecified: Secondary | ICD-10-CM | POA: Diagnosis not present

## 2021-01-19 DIAGNOSIS — S91301D Unspecified open wound, right foot, subsequent encounter: Secondary | ICD-10-CM

## 2021-01-19 DIAGNOSIS — M94261 Chondromalacia, right knee: Secondary | ICD-10-CM | POA: Diagnosis not present

## 2021-01-19 DIAGNOSIS — H8110 Benign paroxysmal vertigo, unspecified ear: Secondary | ICD-10-CM | POA: Diagnosis not present

## 2021-01-19 DIAGNOSIS — E7800 Pure hypercholesterolemia, unspecified: Secondary | ICD-10-CM | POA: Diagnosis not present

## 2021-01-19 NOTE — Progress Notes (Signed)
Patient presents 1 week postop from split-thickness skin graft to right foot.  Over the all she feels like things are going well.  On exam the wrap was removed and skin graft appears to be adherent in most places.  No signs of infection.  The donor site looks to be epithelializing appropriately.  We will plan to continue Xeroform dressing changes and see her again in a couple weeks.  I advised her to try to avoid any rubbing of the graft site to try to encourage his months much of the skin graft to take as possible.  She is understanding.  We will see her at her next visit.

## 2021-01-20 ENCOUNTER — Telehealth: Payer: Self-pay | Admitting: Plastic Surgery

## 2021-01-20 DIAGNOSIS — F32A Depression, unspecified: Secondary | ICD-10-CM | POA: Diagnosis not present

## 2021-01-20 DIAGNOSIS — M94261 Chondromalacia, right knee: Secondary | ICD-10-CM | POA: Diagnosis not present

## 2021-01-20 DIAGNOSIS — K219 Gastro-esophageal reflux disease without esophagitis: Secondary | ICD-10-CM | POA: Diagnosis not present

## 2021-01-20 DIAGNOSIS — A419 Sepsis, unspecified organism: Secondary | ICD-10-CM | POA: Diagnosis not present

## 2021-01-20 DIAGNOSIS — I10 Essential (primary) hypertension: Secondary | ICD-10-CM | POA: Diagnosis not present

## 2021-01-20 DIAGNOSIS — L03115 Cellulitis of right lower limb: Secondary | ICD-10-CM | POA: Diagnosis not present

## 2021-01-20 NOTE — Telephone Encounter (Signed)
Cara from [facility] called and said that they still need orders for wound care, if they orders can be put in. They also wanted to make sure we were aware that they can only provide in home care PRN and come between 2-3 times a week for a couple weeks. They are typically only providing  education for the patient and whoever is responsible for providing daily care for patient.  Please follow up.

## 2021-01-23 ENCOUNTER — Telehealth: Payer: Self-pay | Admitting: Plastic Surgery

## 2021-01-23 DIAGNOSIS — I10 Essential (primary) hypertension: Secondary | ICD-10-CM | POA: Diagnosis not present

## 2021-01-23 DIAGNOSIS — F32A Depression, unspecified: Secondary | ICD-10-CM | POA: Diagnosis not present

## 2021-01-23 DIAGNOSIS — K219 Gastro-esophageal reflux disease without esophagitis: Secondary | ICD-10-CM | POA: Diagnosis not present

## 2021-01-23 DIAGNOSIS — A419 Sepsis, unspecified organism: Secondary | ICD-10-CM | POA: Diagnosis not present

## 2021-01-23 DIAGNOSIS — L03115 Cellulitis of right lower limb: Secondary | ICD-10-CM | POA: Diagnosis not present

## 2021-01-23 DIAGNOSIS — M94261 Chondromalacia, right knee: Secondary | ICD-10-CM | POA: Diagnosis not present

## 2021-01-23 NOTE — Telephone Encounter (Signed)
Patient called and said that she has a lot of supplies unused and unopened that she had for the wound vac and she was wondering if we by chance would accept them as a donation or if there is any other places that may take them so they could be put to good use?  Please follow up, patient would really like to make sure these supplies get used by someone who needs them.

## 2021-01-23 NOTE — Telephone Encounter (Signed)
Spoke to patient. She will bring the supplies at her visit on the 22nd.

## 2021-01-23 NOTE — Telephone Encounter (Signed)
Received success confirmation from [phone] #. Forwarded to clinical to scan folder at [facility]

## 2021-01-23 NOTE — Telephone Encounter (Signed)
Recommend xeroform dressing changes 3x/week. Apply xeroform to split thickness skin graft donor and recipient site 3 times per week.   For donor site: recommend xeroform, 4x4 gauze, ABD pad. Secure this with medipore tape. For recipient site: xeroform, 4x4 gauze, ABD pad, kerlix and ace wrap. Wrap from distal foot to proximal calf.

## 2021-01-23 NOTE — Telephone Encounter (Signed)
Faxed wound care orders to [facility] @ [phone]. Confirmed # with Deneise Lever.

## 2021-01-25 DIAGNOSIS — I10 Essential (primary) hypertension: Secondary | ICD-10-CM | POA: Diagnosis not present

## 2021-01-25 DIAGNOSIS — F32A Depression, unspecified: Secondary | ICD-10-CM | POA: Diagnosis not present

## 2021-01-25 DIAGNOSIS — M94261 Chondromalacia, right knee: Secondary | ICD-10-CM | POA: Diagnosis not present

## 2021-01-25 DIAGNOSIS — A419 Sepsis, unspecified organism: Secondary | ICD-10-CM | POA: Diagnosis not present

## 2021-01-25 DIAGNOSIS — L03115 Cellulitis of right lower limb: Secondary | ICD-10-CM | POA: Diagnosis not present

## 2021-01-25 DIAGNOSIS — K219 Gastro-esophageal reflux disease without esophagitis: Secondary | ICD-10-CM | POA: Diagnosis not present

## 2021-01-30 DIAGNOSIS — L03115 Cellulitis of right lower limb: Secondary | ICD-10-CM | POA: Diagnosis not present

## 2021-01-30 DIAGNOSIS — M94261 Chondromalacia, right knee: Secondary | ICD-10-CM | POA: Diagnosis not present

## 2021-01-30 DIAGNOSIS — F32A Depression, unspecified: Secondary | ICD-10-CM | POA: Diagnosis not present

## 2021-01-30 DIAGNOSIS — I10 Essential (primary) hypertension: Secondary | ICD-10-CM | POA: Diagnosis not present

## 2021-01-30 DIAGNOSIS — A419 Sepsis, unspecified organism: Secondary | ICD-10-CM | POA: Diagnosis not present

## 2021-01-30 DIAGNOSIS — K219 Gastro-esophageal reflux disease without esophagitis: Secondary | ICD-10-CM | POA: Diagnosis not present

## 2021-02-01 DIAGNOSIS — I10 Essential (primary) hypertension: Secondary | ICD-10-CM | POA: Diagnosis not present

## 2021-02-01 DIAGNOSIS — A419 Sepsis, unspecified organism: Secondary | ICD-10-CM | POA: Diagnosis not present

## 2021-02-01 DIAGNOSIS — F32A Depression, unspecified: Secondary | ICD-10-CM | POA: Diagnosis not present

## 2021-02-01 DIAGNOSIS — L03115 Cellulitis of right lower limb: Secondary | ICD-10-CM | POA: Diagnosis not present

## 2021-02-01 DIAGNOSIS — K219 Gastro-esophageal reflux disease without esophagitis: Secondary | ICD-10-CM | POA: Diagnosis not present

## 2021-02-01 DIAGNOSIS — M94261 Chondromalacia, right knee: Secondary | ICD-10-CM | POA: Diagnosis not present

## 2021-02-02 ENCOUNTER — Telehealth: Payer: Self-pay | Admitting: *Deleted

## 2021-02-02 ENCOUNTER — Other Ambulatory Visit: Payer: Self-pay

## 2021-02-02 ENCOUNTER — Ambulatory Visit (INDEPENDENT_AMBULATORY_CARE_PROVIDER_SITE_OTHER): Payer: Medicare Other | Admitting: Plastic Surgery

## 2021-02-02 DIAGNOSIS — S91301D Unspecified open wound, right foot, subsequent encounter: Secondary | ICD-10-CM

## 2021-02-02 NOTE — Telephone Encounter (Signed)
Received on (01/28/21) via of fax Request for Discharge Orders from [facility].  Given to provider to sign.    Discharge orders signed and faxed along with recent office notes to [facility].  Confirmation received and copy scanned into the chart.//AB/CMA

## 2021-02-02 NOTE — Progress Notes (Signed)
80 year old female here for follow-up after split-thickness skin graft to her right leg/foot.  She is doing well.  She is here with her daughter.  She feels that things are going well.  On exam the skin graft has epithelialized inferiorly and superior.  She has a good base of granulation tissue.  There is no erythema or cellulitic change.  There is no foul odors.  There is new epithelialization noted.  Recommend continue with Xeroform dressing change.  Recommend following up in 2 to 3 weeks for reevaluation.  Recommend calling with questions or concerns.

## 2021-02-03 DIAGNOSIS — K219 Gastro-esophageal reflux disease without esophagitis: Secondary | ICD-10-CM | POA: Diagnosis not present

## 2021-02-03 DIAGNOSIS — A419 Sepsis, unspecified organism: Secondary | ICD-10-CM | POA: Diagnosis not present

## 2021-02-03 DIAGNOSIS — M94261 Chondromalacia, right knee: Secondary | ICD-10-CM | POA: Diagnosis not present

## 2021-02-03 DIAGNOSIS — I10 Essential (primary) hypertension: Secondary | ICD-10-CM | POA: Diagnosis not present

## 2021-02-03 DIAGNOSIS — F32A Depression, unspecified: Secondary | ICD-10-CM | POA: Diagnosis not present

## 2021-02-03 DIAGNOSIS — L03115 Cellulitis of right lower limb: Secondary | ICD-10-CM | POA: Diagnosis not present

## 2021-02-08 DIAGNOSIS — L03115 Cellulitis of right lower limb: Secondary | ICD-10-CM | POA: Diagnosis not present

## 2021-02-08 DIAGNOSIS — K219 Gastro-esophageal reflux disease without esophagitis: Secondary | ICD-10-CM | POA: Diagnosis not present

## 2021-02-08 DIAGNOSIS — M94261 Chondromalacia, right knee: Secondary | ICD-10-CM | POA: Diagnosis not present

## 2021-02-08 DIAGNOSIS — F32A Depression, unspecified: Secondary | ICD-10-CM | POA: Diagnosis not present

## 2021-02-08 DIAGNOSIS — A419 Sepsis, unspecified organism: Secondary | ICD-10-CM | POA: Diagnosis not present

## 2021-02-08 DIAGNOSIS — I10 Essential (primary) hypertension: Secondary | ICD-10-CM | POA: Diagnosis not present

## 2021-02-09 ENCOUNTER — Telehealth: Payer: Medicare Other

## 2021-02-09 DIAGNOSIS — R928 Other abnormal and inconclusive findings on diagnostic imaging of breast: Secondary | ICD-10-CM | POA: Diagnosis not present

## 2021-02-09 DIAGNOSIS — Z853 Personal history of malignant neoplasm of breast: Secondary | ICD-10-CM | POA: Diagnosis not present

## 2021-02-10 DIAGNOSIS — M94261 Chondromalacia, right knee: Secondary | ICD-10-CM | POA: Diagnosis not present

## 2021-02-10 DIAGNOSIS — F32A Depression, unspecified: Secondary | ICD-10-CM | POA: Diagnosis not present

## 2021-02-10 DIAGNOSIS — I10 Essential (primary) hypertension: Secondary | ICD-10-CM | POA: Diagnosis not present

## 2021-02-10 DIAGNOSIS — K219 Gastro-esophageal reflux disease without esophagitis: Secondary | ICD-10-CM | POA: Diagnosis not present

## 2021-02-10 DIAGNOSIS — L03115 Cellulitis of right lower limb: Secondary | ICD-10-CM | POA: Diagnosis not present

## 2021-02-10 DIAGNOSIS — A419 Sepsis, unspecified organism: Secondary | ICD-10-CM | POA: Diagnosis not present

## 2021-02-17 DIAGNOSIS — M94261 Chondromalacia, right knee: Secondary | ICD-10-CM | POA: Diagnosis not present

## 2021-02-17 DIAGNOSIS — A419 Sepsis, unspecified organism: Secondary | ICD-10-CM | POA: Diagnosis not present

## 2021-02-17 DIAGNOSIS — K219 Gastro-esophageal reflux disease without esophagitis: Secondary | ICD-10-CM | POA: Diagnosis not present

## 2021-02-17 DIAGNOSIS — I10 Essential (primary) hypertension: Secondary | ICD-10-CM | POA: Diagnosis not present

## 2021-02-17 DIAGNOSIS — L03115 Cellulitis of right lower limb: Secondary | ICD-10-CM | POA: Diagnosis not present

## 2021-02-17 DIAGNOSIS — F32A Depression, unspecified: Secondary | ICD-10-CM | POA: Diagnosis not present

## 2021-02-18 DIAGNOSIS — Z853 Personal history of malignant neoplasm of breast: Secondary | ICD-10-CM | POA: Diagnosis not present

## 2021-02-18 DIAGNOSIS — K219 Gastro-esophageal reflux disease without esophagitis: Secondary | ICD-10-CM | POA: Diagnosis not present

## 2021-02-18 DIAGNOSIS — L03115 Cellulitis of right lower limb: Secondary | ICD-10-CM | POA: Diagnosis not present

## 2021-02-18 DIAGNOSIS — H8110 Benign paroxysmal vertigo, unspecified ear: Secondary | ICD-10-CM | POA: Diagnosis not present

## 2021-02-18 DIAGNOSIS — F32A Depression, unspecified: Secondary | ICD-10-CM | POA: Diagnosis not present

## 2021-02-18 DIAGNOSIS — A419 Sepsis, unspecified organism: Secondary | ICD-10-CM | POA: Diagnosis not present

## 2021-02-18 DIAGNOSIS — D649 Anemia, unspecified: Secondary | ICD-10-CM | POA: Diagnosis not present

## 2021-02-18 DIAGNOSIS — E7800 Pure hypercholesterolemia, unspecified: Secondary | ICD-10-CM | POA: Diagnosis not present

## 2021-02-18 DIAGNOSIS — M810 Age-related osteoporosis without current pathological fracture: Secondary | ICD-10-CM | POA: Diagnosis not present

## 2021-02-18 DIAGNOSIS — M94261 Chondromalacia, right knee: Secondary | ICD-10-CM | POA: Diagnosis not present

## 2021-02-18 DIAGNOSIS — K5730 Diverticulosis of large intestine without perforation or abscess without bleeding: Secondary | ICD-10-CM | POA: Diagnosis not present

## 2021-02-18 DIAGNOSIS — I10 Essential (primary) hypertension: Secondary | ICD-10-CM | POA: Diagnosis not present

## 2021-02-20 NOTE — Progress Notes (Signed)
Patient is a 81 year old female with PMH of right foot cellulitis s/p debridement with placement of wound matrix and wound VAC 11/08/2020 who then had STSG 01/13/2021 with Dr. Claudia Desanctis who presents to clinic for postoperative follow-up.  She was last seen here in clinic on 02/02/2021.  At that time, she felt as though things were going well.  The skin graft had epithelialized inferiorly and superiorly.  There was good base of granular tissue.  New epithelialization noted compared to prior encounter.  Plan was for continued Xeroform dressing changes.  Today, patient is accompanied by her daughter.  She states that she is doing well.  She has had to drive a couple of times and want to make sure it is okay.  She stated there was some rubbing on her lateral malleolus from previous splint which she has since discontinued.  She was afraid that perhaps was prohibiting her from healing well.  She is now wearing Men's slip on slippers.  She states that she is typically a very active person and cannot wait until she can resume walking her dogs.  She has been continue with the Xeroform dressing changes every other day followed by 4 x 4 gauze, Kerlix, and Ace wrap.  She does have assistance from home health aides.  Her husband also provides assistance.  She only takes Tylenol intermittently for discomfort.  Physical exam is entirely reassuring.  No cellulitic findings.  Continued advancing epithelization compared to prior images.  Pedal pulse intact.  Wiggles toes.  Good range of motion.  Sensation intact throughout.  Compared to her encounter 02/02/2021, she has made excellent improvement with regard to her wound healing and STSG incorporation.  We will recommend that she continue with current management.  Return to clinic in 3 weeks for reevaluation.  I told her that she can increase activity, as tolerated.  I have no problems with her driving.

## 2021-02-21 DIAGNOSIS — A419 Sepsis, unspecified organism: Secondary | ICD-10-CM | POA: Diagnosis not present

## 2021-02-21 DIAGNOSIS — K219 Gastro-esophageal reflux disease without esophagitis: Secondary | ICD-10-CM | POA: Diagnosis not present

## 2021-02-21 DIAGNOSIS — I10 Essential (primary) hypertension: Secondary | ICD-10-CM | POA: Diagnosis not present

## 2021-02-21 DIAGNOSIS — L03115 Cellulitis of right lower limb: Secondary | ICD-10-CM | POA: Diagnosis not present

## 2021-02-21 DIAGNOSIS — F32A Depression, unspecified: Secondary | ICD-10-CM | POA: Diagnosis not present

## 2021-02-21 DIAGNOSIS — M94261 Chondromalacia, right knee: Secondary | ICD-10-CM | POA: Diagnosis not present

## 2021-02-23 ENCOUNTER — Ambulatory Visit (INDEPENDENT_AMBULATORY_CARE_PROVIDER_SITE_OTHER): Payer: Medicare Other | Admitting: Physician Assistant

## 2021-02-23 ENCOUNTER — Other Ambulatory Visit: Payer: Self-pay

## 2021-02-23 ENCOUNTER — Encounter: Payer: Self-pay | Admitting: Physician Assistant

## 2021-02-23 DIAGNOSIS — S91301D Unspecified open wound, right foot, subsequent encounter: Secondary | ICD-10-CM

## 2021-02-24 ENCOUNTER — Telehealth: Payer: Self-pay | Admitting: Family Medicine

## 2021-02-24 DIAGNOSIS — A419 Sepsis, unspecified organism: Secondary | ICD-10-CM | POA: Diagnosis not present

## 2021-02-24 DIAGNOSIS — I10 Essential (primary) hypertension: Secondary | ICD-10-CM | POA: Diagnosis not present

## 2021-02-24 DIAGNOSIS — F32A Depression, unspecified: Secondary | ICD-10-CM | POA: Diagnosis not present

## 2021-02-24 DIAGNOSIS — K219 Gastro-esophageal reflux disease without esophagitis: Secondary | ICD-10-CM | POA: Diagnosis not present

## 2021-02-24 DIAGNOSIS — M94261 Chondromalacia, right knee: Secondary | ICD-10-CM | POA: Diagnosis not present

## 2021-02-24 DIAGNOSIS — L03115 Cellulitis of right lower limb: Secondary | ICD-10-CM | POA: Diagnosis not present

## 2021-02-24 NOTE — Telephone Encounter (Signed)
[facility] [phone] (secure line)  Verbal orders needed for the following:  OT eval, requesting to continue therapy 2wk 4x 1wk  1x  PT Prescription to evaluate her left hip pain

## 2021-02-25 DIAGNOSIS — M94261 Chondromalacia, right knee: Secondary | ICD-10-CM | POA: Diagnosis not present

## 2021-02-25 DIAGNOSIS — A419 Sepsis, unspecified organism: Secondary | ICD-10-CM | POA: Diagnosis not present

## 2021-02-25 DIAGNOSIS — F32A Depression, unspecified: Secondary | ICD-10-CM | POA: Diagnosis not present

## 2021-02-25 DIAGNOSIS — K219 Gastro-esophageal reflux disease without esophagitis: Secondary | ICD-10-CM | POA: Diagnosis not present

## 2021-02-25 DIAGNOSIS — L03115 Cellulitis of right lower limb: Secondary | ICD-10-CM | POA: Diagnosis not present

## 2021-02-25 DIAGNOSIS — I10 Essential (primary) hypertension: Secondary | ICD-10-CM | POA: Diagnosis not present

## 2021-02-26 NOTE — Telephone Encounter (Signed)
Please give the order.  When can patient schedule f/u with me?  Please check.  Thanks.

## 2021-02-27 DIAGNOSIS — K219 Gastro-esophageal reflux disease without esophagitis: Secondary | ICD-10-CM | POA: Diagnosis not present

## 2021-02-27 DIAGNOSIS — M94261 Chondromalacia, right knee: Secondary | ICD-10-CM | POA: Diagnosis not present

## 2021-02-27 DIAGNOSIS — I10 Essential (primary) hypertension: Secondary | ICD-10-CM | POA: Diagnosis not present

## 2021-02-27 DIAGNOSIS — F32A Depression, unspecified: Secondary | ICD-10-CM | POA: Diagnosis not present

## 2021-02-27 DIAGNOSIS — A419 Sepsis, unspecified organism: Secondary | ICD-10-CM | POA: Diagnosis not present

## 2021-02-27 DIAGNOSIS — L03115 Cellulitis of right lower limb: Secondary | ICD-10-CM | POA: Diagnosis not present

## 2021-02-27 NOTE — Telephone Encounter (Signed)
Called and left verbal orders on VM

## 2021-02-28 DIAGNOSIS — I10 Essential (primary) hypertension: Secondary | ICD-10-CM | POA: Diagnosis not present

## 2021-02-28 DIAGNOSIS — L03115 Cellulitis of right lower limb: Secondary | ICD-10-CM | POA: Diagnosis not present

## 2021-02-28 DIAGNOSIS — A419 Sepsis, unspecified organism: Secondary | ICD-10-CM | POA: Diagnosis not present

## 2021-02-28 DIAGNOSIS — F32A Depression, unspecified: Secondary | ICD-10-CM | POA: Diagnosis not present

## 2021-02-28 DIAGNOSIS — M94261 Chondromalacia, right knee: Secondary | ICD-10-CM | POA: Diagnosis not present

## 2021-02-28 DIAGNOSIS — K219 Gastro-esophageal reflux disease without esophagitis: Secondary | ICD-10-CM | POA: Diagnosis not present

## 2021-03-01 DIAGNOSIS — F32A Depression, unspecified: Secondary | ICD-10-CM | POA: Diagnosis not present

## 2021-03-01 DIAGNOSIS — I10 Essential (primary) hypertension: Secondary | ICD-10-CM | POA: Diagnosis not present

## 2021-03-01 DIAGNOSIS — K219 Gastro-esophageal reflux disease without esophagitis: Secondary | ICD-10-CM | POA: Diagnosis not present

## 2021-03-01 DIAGNOSIS — M94261 Chondromalacia, right knee: Secondary | ICD-10-CM | POA: Diagnosis not present

## 2021-03-01 DIAGNOSIS — L03115 Cellulitis of right lower limb: Secondary | ICD-10-CM | POA: Diagnosis not present

## 2021-03-01 DIAGNOSIS — A419 Sepsis, unspecified organism: Secondary | ICD-10-CM | POA: Diagnosis not present

## 2021-03-02 DIAGNOSIS — K219 Gastro-esophageal reflux disease without esophagitis: Secondary | ICD-10-CM | POA: Diagnosis not present

## 2021-03-02 DIAGNOSIS — M94261 Chondromalacia, right knee: Secondary | ICD-10-CM | POA: Diagnosis not present

## 2021-03-02 DIAGNOSIS — F32A Depression, unspecified: Secondary | ICD-10-CM | POA: Diagnosis not present

## 2021-03-02 DIAGNOSIS — I10 Essential (primary) hypertension: Secondary | ICD-10-CM | POA: Diagnosis not present

## 2021-03-02 DIAGNOSIS — L03115 Cellulitis of right lower limb: Secondary | ICD-10-CM | POA: Diagnosis not present

## 2021-03-02 DIAGNOSIS — A419 Sepsis, unspecified organism: Secondary | ICD-10-CM | POA: Diagnosis not present

## 2021-03-03 DIAGNOSIS — M94261 Chondromalacia, right knee: Secondary | ICD-10-CM | POA: Diagnosis not present

## 2021-03-03 DIAGNOSIS — F32A Depression, unspecified: Secondary | ICD-10-CM | POA: Diagnosis not present

## 2021-03-03 DIAGNOSIS — L03115 Cellulitis of right lower limb: Secondary | ICD-10-CM | POA: Diagnosis not present

## 2021-03-03 DIAGNOSIS — A419 Sepsis, unspecified organism: Secondary | ICD-10-CM | POA: Diagnosis not present

## 2021-03-03 DIAGNOSIS — K219 Gastro-esophageal reflux disease without esophagitis: Secondary | ICD-10-CM | POA: Diagnosis not present

## 2021-03-03 DIAGNOSIS — I10 Essential (primary) hypertension: Secondary | ICD-10-CM | POA: Diagnosis not present

## 2021-03-06 DIAGNOSIS — K219 Gastro-esophageal reflux disease without esophagitis: Secondary | ICD-10-CM | POA: Diagnosis not present

## 2021-03-06 DIAGNOSIS — F32A Depression, unspecified: Secondary | ICD-10-CM | POA: Diagnosis not present

## 2021-03-06 DIAGNOSIS — L03115 Cellulitis of right lower limb: Secondary | ICD-10-CM | POA: Diagnosis not present

## 2021-03-06 DIAGNOSIS — A419 Sepsis, unspecified organism: Secondary | ICD-10-CM | POA: Diagnosis not present

## 2021-03-06 DIAGNOSIS — M94261 Chondromalacia, right knee: Secondary | ICD-10-CM | POA: Diagnosis not present

## 2021-03-06 DIAGNOSIS — I10 Essential (primary) hypertension: Secondary | ICD-10-CM | POA: Diagnosis not present

## 2021-03-08 DIAGNOSIS — K219 Gastro-esophageal reflux disease without esophagitis: Secondary | ICD-10-CM | POA: Diagnosis not present

## 2021-03-08 DIAGNOSIS — F32A Depression, unspecified: Secondary | ICD-10-CM | POA: Diagnosis not present

## 2021-03-08 DIAGNOSIS — M94261 Chondromalacia, right knee: Secondary | ICD-10-CM | POA: Diagnosis not present

## 2021-03-08 DIAGNOSIS — L03115 Cellulitis of right lower limb: Secondary | ICD-10-CM | POA: Diagnosis not present

## 2021-03-08 DIAGNOSIS — I10 Essential (primary) hypertension: Secondary | ICD-10-CM | POA: Diagnosis not present

## 2021-03-08 DIAGNOSIS — A419 Sepsis, unspecified organism: Secondary | ICD-10-CM | POA: Diagnosis not present

## 2021-03-13 DIAGNOSIS — A419 Sepsis, unspecified organism: Secondary | ICD-10-CM | POA: Diagnosis not present

## 2021-03-13 DIAGNOSIS — I10 Essential (primary) hypertension: Secondary | ICD-10-CM | POA: Diagnosis not present

## 2021-03-13 DIAGNOSIS — F32A Depression, unspecified: Secondary | ICD-10-CM | POA: Diagnosis not present

## 2021-03-13 DIAGNOSIS — M94261 Chondromalacia, right knee: Secondary | ICD-10-CM | POA: Diagnosis not present

## 2021-03-13 DIAGNOSIS — K219 Gastro-esophageal reflux disease without esophagitis: Secondary | ICD-10-CM | POA: Diagnosis not present

## 2021-03-13 DIAGNOSIS — L03115 Cellulitis of right lower limb: Secondary | ICD-10-CM | POA: Diagnosis not present

## 2021-03-13 NOTE — Progress Notes (Signed)
Patient is a 80 year old female with PMH of right foot cellulitis s/p debridement with placement of wound matrix and wound VAC 11/08/2020 who then had STSG 01/13/2021 with Dr. Claudia Desanctis who presents to clinic for postoperative follow-up.  She was seen most recently 02/23/2021.  At that time, exam was reassuring.  Continued advancing epithelization noted.  Good STSG incorporation.  Recommended continued management with Xeroform dressing changes every other day followed by 4 x 4 gauze, Kerlix, and secured with Ace wrap.  Today, patient is accompanied by her daughter at bedside.  States that she has been continuing to perform dressing changes with Xeroform secured with Kerlix and Ace wrap, but unfortunately has not seen any considerable improvement since last encounter.  She also mentions that she is profoundly itchy and has been so for the past 7 months.  She states that her itchiness is what precipitated her wound in the first place.  She takes daily antihistamines, but is thinking about returning to her dermatologist who previously prescribed her oral medications aside from her antihistamine.  I advised her against oral steroids as that may impair her wound healing.  Told her to follow-up with her PCP and consider hepatic function panel.  Physical exam reveals 2 areas that have not yet epithelialized.  The one towards her lateral malleolus is 4 x 3 cm, the wound that is more distal is 6 x 3 cm.  Excellent granulation.  These areas have gotten larger since last encounter.  The remainder of her exam is entirely reassuring.  Will transition her to collagen dressing changes every other day followed by gauze, Kerlix, and Ace wrap.  Return in 3 weeks.

## 2021-03-15 DIAGNOSIS — M94261 Chondromalacia, right knee: Secondary | ICD-10-CM | POA: Diagnosis not present

## 2021-03-15 DIAGNOSIS — K219 Gastro-esophageal reflux disease without esophagitis: Secondary | ICD-10-CM | POA: Diagnosis not present

## 2021-03-15 DIAGNOSIS — I10 Essential (primary) hypertension: Secondary | ICD-10-CM | POA: Diagnosis not present

## 2021-03-15 DIAGNOSIS — F32A Depression, unspecified: Secondary | ICD-10-CM | POA: Diagnosis not present

## 2021-03-15 DIAGNOSIS — L03115 Cellulitis of right lower limb: Secondary | ICD-10-CM | POA: Diagnosis not present

## 2021-03-15 DIAGNOSIS — A419 Sepsis, unspecified organism: Secondary | ICD-10-CM | POA: Diagnosis not present

## 2021-03-16 ENCOUNTER — Ambulatory Visit (INDEPENDENT_AMBULATORY_CARE_PROVIDER_SITE_OTHER): Payer: Medicare Other | Admitting: Physician Assistant

## 2021-03-16 ENCOUNTER — Telehealth: Payer: Medicare Other

## 2021-03-16 ENCOUNTER — Other Ambulatory Visit: Payer: Self-pay

## 2021-03-16 DIAGNOSIS — S91301D Unspecified open wound, right foot, subsequent encounter: Secondary | ICD-10-CM

## 2021-03-17 DIAGNOSIS — K219 Gastro-esophageal reflux disease without esophagitis: Secondary | ICD-10-CM | POA: Diagnosis not present

## 2021-03-17 DIAGNOSIS — M94261 Chondromalacia, right knee: Secondary | ICD-10-CM | POA: Diagnosis not present

## 2021-03-17 DIAGNOSIS — L03115 Cellulitis of right lower limb: Secondary | ICD-10-CM | POA: Diagnosis not present

## 2021-03-17 DIAGNOSIS — I10 Essential (primary) hypertension: Secondary | ICD-10-CM | POA: Diagnosis not present

## 2021-03-17 DIAGNOSIS — F32A Depression, unspecified: Secondary | ICD-10-CM | POA: Diagnosis not present

## 2021-03-17 DIAGNOSIS — A419 Sepsis, unspecified organism: Secondary | ICD-10-CM | POA: Diagnosis not present

## 2021-03-20 DIAGNOSIS — M810 Age-related osteoporosis without current pathological fracture: Secondary | ICD-10-CM | POA: Diagnosis not present

## 2021-03-20 DIAGNOSIS — L03115 Cellulitis of right lower limb: Secondary | ICD-10-CM | POA: Diagnosis not present

## 2021-03-20 DIAGNOSIS — D649 Anemia, unspecified: Secondary | ICD-10-CM | POA: Diagnosis not present

## 2021-03-20 DIAGNOSIS — F32A Depression, unspecified: Secondary | ICD-10-CM | POA: Diagnosis not present

## 2021-03-20 DIAGNOSIS — M94261 Chondromalacia, right knee: Secondary | ICD-10-CM | POA: Diagnosis not present

## 2021-03-20 DIAGNOSIS — A419 Sepsis, unspecified organism: Secondary | ICD-10-CM | POA: Diagnosis not present

## 2021-03-20 DIAGNOSIS — K5730 Diverticulosis of large intestine without perforation or abscess without bleeding: Secondary | ICD-10-CM | POA: Diagnosis not present

## 2021-03-20 DIAGNOSIS — K219 Gastro-esophageal reflux disease without esophagitis: Secondary | ICD-10-CM | POA: Diagnosis not present

## 2021-03-20 DIAGNOSIS — H8110 Benign paroxysmal vertigo, unspecified ear: Secondary | ICD-10-CM | POA: Diagnosis not present

## 2021-03-20 DIAGNOSIS — E7800 Pure hypercholesterolemia, unspecified: Secondary | ICD-10-CM | POA: Diagnosis not present

## 2021-03-20 DIAGNOSIS — I10 Essential (primary) hypertension: Secondary | ICD-10-CM | POA: Diagnosis not present

## 2021-03-20 DIAGNOSIS — Z853 Personal history of malignant neoplasm of breast: Secondary | ICD-10-CM | POA: Diagnosis not present

## 2021-03-24 DIAGNOSIS — F32A Depression, unspecified: Secondary | ICD-10-CM | POA: Diagnosis not present

## 2021-03-24 DIAGNOSIS — L03115 Cellulitis of right lower limb: Secondary | ICD-10-CM | POA: Diagnosis not present

## 2021-03-24 DIAGNOSIS — I10 Essential (primary) hypertension: Secondary | ICD-10-CM | POA: Diagnosis not present

## 2021-03-24 DIAGNOSIS — A419 Sepsis, unspecified organism: Secondary | ICD-10-CM | POA: Diagnosis not present

## 2021-03-24 DIAGNOSIS — K219 Gastro-esophageal reflux disease without esophagitis: Secondary | ICD-10-CM | POA: Diagnosis not present

## 2021-03-24 DIAGNOSIS — M94261 Chondromalacia, right knee: Secondary | ICD-10-CM | POA: Diagnosis not present

## 2021-03-27 ENCOUNTER — Telehealth: Payer: Self-pay

## 2021-03-27 ENCOUNTER — Encounter: Payer: Self-pay | Admitting: Family Medicine

## 2021-03-27 ENCOUNTER — Ambulatory Visit (INDEPENDENT_AMBULATORY_CARE_PROVIDER_SITE_OTHER): Payer: Medicare Other

## 2021-03-27 DIAGNOSIS — S91301A Unspecified open wound, right foot, initial encounter: Secondary | ICD-10-CM

## 2021-03-27 DIAGNOSIS — I10 Essential (primary) hypertension: Secondary | ICD-10-CM

## 2021-03-27 NOTE — Chronic Care Management (AMB) (Signed)
Chronic Care Management   CCM RN Visit Note  03/27/2021 Name: [name] Price MRN: [id number] DOB: 07/24/1940  Subjective: [name] Price is a 81 y.o. year old female who is a primary care patient of Tonia Ghent, MD. The care management team was consulted for assistance with disease management and care coordination needs.    Engaged with patient by telephone for follow up visit in response to provider referral for case management and/or care coordination services.   Consent to Services:  The patient was given information about Chronic Care Management services, agreed to services, and gave verbal consent prior to initiation of services.  Please see initial visit note for detailed documentation.   Patient agreed to services and verbal consent obtained.   Assessment: Review of patient past medical history, allergies, medications, health status, including review of consultants reports, laboratory and other test data, was performed as part of comprehensive evaluation and provision of chronic care management services.   SDOH (Social Determinants of Health) assessments and interventions performed:    CCM Care Plan  Allergies  Allergen Reactions   Codeine Nausea And Vomiting    Outpatient Encounter Medications as of 03/27/2021  Medication Sig   acetaminophen (TYLENOL) 500 MG tablet Take 500 mg by mouth daily as needed (pain).   B Complex Vitamins (VITAMIN B COMPLEX) TABS Take 1 tablet by mouth every morning.   Calcium Carbonate-Vitamin D (CALCIUM-D PO) Take 1 tablet by mouth 2 (two) times daily.   cetirizine (ZYRTEC) 10 MG chewable tablet Chew 10 mg by mouth daily.   Cholecalciferol (VITAMIN D-3 PO) Take 1 tablet by mouth every morning.   PARoxetine (PAXIL-CR) 25 MG 24 hr tablet Take 25 mg by mouth every morning.   acidophilus (RISAQUAD) CAPS capsule Take 1 capsule by mouth daily.   ferrous gluconate (FERGON) 324 MG tablet Take 1 tablet (324 mg total) by mouth 2 (two) times daily  with a meal.   pantoprazole (PROTONIX) 40 MG tablet Take 1 tablet (40 mg total) by mouth 2 (two) times daily.   No facility-administered encounter medications on file as of 03/27/2021.    Patient Active Problem List   Diagnosis Date Noted   Gastroesophageal reflux disease    Depression    Wound of right foot 10/29/2020   Anemia 10/29/2020   HTN (hypertension) 10/29/2020   Sepsis ([place])    Rash 09/05/2020   History of hip replacement    Advance care planning 06/25/2017   Health care maintenance 06/25/2017   Hot flashes 06/25/2017   BPV (benign positional vertigo) 03/15/2015   Ganglion cyst of wrist 02/23/2015   Extensor tendon rupture of hand 02/01/2015   Left wrist effusion 01/26/2015   KNEE PAIN, RIGHT 11/13/2007   CHONDROMALACIA PATELLA, LEFT 03/19/2007   Osteoporosis 03/19/2007   HEMATURIA, HX OF 03/19/2007   DIVERTICULOSIS, COLON 08/10/2002   HYPERCHOLESTEROLEMIA 06/05/1996    Conditions to be addressed/monitored:HTN and Wound to right foot ( Impaired skin integrity)  Care Plan : RN- Plan of care  Updates made by Dannielle Karvonen, RN since 03/27/2021 12:00 AM     Problem: Development of care plan for management of impaired tissue/skin/ wound integrity in patient with chronic conditions ( HTN,)   Priority: High     Long-Range Goal: improved skin integrity and management of chronic conditions   Start Date: 11/28/2020  Expected End Date: 06/09/2021  Recent Progress: On track  Priority: High  Note:   Current Barriers:  Knowledge Deficits related to plan  of care for management of HTN and skin integrity Patient reports having follow up visit with the plastic surgeon on 03/16/21 regarding her right foot wound.   She states the surgeon ordered new dressing changes with Collagen strips.  She states she is receiving services with [facility] home [facility].  She reports there has been some confusion regarding the order therefore being a delay in her receiving the new dressing change  supplies. She reports calling [facility] today and speaking with Aaron Edelman to follow up on the supplies.  Patient reports she did not change her dressing for 4 days because she was waiting on the new collagen dressing treatment.  She reports having her husband change her dressing today with the previous dressing treatment of Xerofoam.   RNCM called [facility] and spoke with Aaron Edelman, Publishing copy for update on supplies.  He states the initial order did not specify collagen strips. He reports order has since been updated and collagen dressing has been ordered through [facility]. He states patient should receive supplies within 2-3 days.  Aaron Edelman states patient is scheduled for next follow up with skilled nurse on Thursday 03/30/2021.  He reports advising patient when speaking with her this morning to continue her previous dressing changes and frequency as ordered until new dressing supplies are received.  RNCM re-advised patient to continue previous dressing changes until new dressing supplies received. Patient verbalized understanding.  Patient states she continues to have ongoing skin itching which she reported to the plastic surgeon.  She states she was taking antihistamines daily without improvement.  Patient states her plastic surgeon advised her to consider having a hepatic function panel done.  RNCM Clinical Goal(s):  Patient will verbalize understanding of plan for management of HTN and skin integrity  through collaboration with RN Care manager, provider, and care team.   Interventions: 1:1 collaboration with primary care provider regarding development and update of comprehensive plan of care as evidenced by provider attestation and co-signature Inter-disciplinary care team collaboration (see longitudinal plan of care) Evaluation of current treatment plan related to  self management and patient's adherence to plan as established by provider  Hypertension Interventions:  New Goal Last practice  recorded BP readings:  BP Readings from Last 3 Encounters:  11/10/20 (!) 101/52  10/19/20 100/71  10/07/20 (!) 112/50  Most recent eGFR/CrCl: No results found for: EGFR  No components found for: CRCL  Evaluation of current treatment plan related for hypertension self management and patient's adherence to plan as established by provider; Reviewed medications with patient and discussed importance of compliance; Discussed plans with patient for ongoing care management follow up and provided patient with direct contact information for care management team; Advised patient, providing education and rationale, to monitor blood pressure 2-3 times per month and record, calling PCP for findings outside established parameters;  Reviewed scheduled/upcoming provider appointments Advised patient to call provider office to scheduled annual wellness visit.  Per chart review last annual wellness visit was 06/26/2018.     Skin Integrity Interventions:  Goal on track:  Yes. Long term goal Evaluation of current treatment plan related for  skin integrity  , self-management and patient's adherence to plan as established by provider. Discussed plans with patient for ongoing care management follow up and provided patient with direct contact information for care management team Reviewed medications with patient and discussed importance of compliance Reviewed scheduled/upcoming provider appointments  Discussed signs/ symptoms of infection.  Advised to notify provider of symptoms as soon as possible.  Advised  patient to continue to work with home health regarding wound care [facility] to follow up on patients dressing supplies  Provided patient with education information through [facility].  Assisted patient with sending message to primary provider regarding need for Hepatic function panel requested by her plastic surgeon, Dr. Claudia Desanctis Advised to follow up with providers as recommended.   Patient  Goals/Self-Care Activities: Take your medications as prescribed and refill timely Attend all scheduled provider appointments Call provider office for new concerns or questions Continue to work with the home health agency in management of wound care.  Call if you have questions Avoid rubbing and scratching areas near/ around wound.  Rubbing and scratching can cause further injury and delay healing Eat a diet that promotes healing ( high protein, calorie diet)  Notify doctor for signs/ symptoms of infection ( increased wound drainage, increasing pain, local warmth and redness, fever) Call your RN case manager if you do not receive your dressing supplies by 03/31/2020. Send message to your primary doctor regarding requested hepatic function panel request from Dr. Claudia Desanctis.        Plan:The patient has been provided with contact information for the care management team and has been advised to call with any health related questions or concerns.  The care management team will reach out to the patient again over the next 45 days. Quinn Plowman RN,BSN,CCM RN Case Manager [place]  [phone]

## 2021-03-27 NOTE — Telephone Encounter (Signed)
Patient called to say the last time she came to see [place], we changed her dressing to collagen strips which were to be changed every-other-day.  She said someone from [facility] came to her house once and changed the dressing.  She said they said they needed to order some more collagen strips.  Patient called [facility] because she hasn't had anyone come out since that first visit, and they said they need an order from [place].  Please call.

## 2021-03-27 NOTE — Telephone Encounter (Signed)
Returned patients call, LMVM

## 2021-03-27 NOTE — Patient Instructions (Signed)
Visit Information  Thank you for taking time to visit with me today. Please don't hesitate to contact me if I can be of assistance to you before our next scheduled telephone appointment.  Following are the goals we discussed today:  Take your medications as prescribed and refill timely Attend all scheduled provider appointments Call provider office for new concerns or questions Continue to work with the home health agency in management of wound care.  Call if you have questions Avoid rubbing and scratching areas near/ around wound.  Rubbing and scratching can cause further injury and delay healing Eat a diet that promotes healing ( high protein, calorie diet)  Notify doctor for signs/ symptoms of infection ( increased wound drainage, increasing pain, local warmth and redness, fever) Call your RN case manager if you do not receive your dressing supplies by 03/31/2020. Send message to your primary doctor regarding requested hepatic function panel request from Dr. Claudia Desanctis.   Our next appointment is by telephone on 04/27/2021 at 11:00am  Please call the care guide team at [phone] if you need to cancel or reschedule your appointment.   If you are experiencing a Mental Health or [place] or need someone to talk to, please call the Suicide and Crisis Lifeline: 988 call 1-800-273-TALK (toll free, 24 hour hotline)   Patient verbalizes understanding of instructions and care plan provided today and agrees to view in [place]. Active MyChart status confirmed with patient.    Quinn Plowman RN,BSN,CCM RN Case Manager [place]  [phone]

## 2021-03-28 NOTE — Telephone Encounter (Signed)
Faxed new order yesterday to [facility] use Collagen strips, gauze, Kerlix, and ace wrap every other day. Spoke with patient this morning and advised

## 2021-03-30 DIAGNOSIS — A419 Sepsis, unspecified organism: Secondary | ICD-10-CM | POA: Diagnosis not present

## 2021-03-30 DIAGNOSIS — M94261 Chondromalacia, right knee: Secondary | ICD-10-CM | POA: Diagnosis not present

## 2021-03-30 DIAGNOSIS — K219 Gastro-esophageal reflux disease without esophagitis: Secondary | ICD-10-CM | POA: Diagnosis not present

## 2021-03-30 DIAGNOSIS — I10 Essential (primary) hypertension: Secondary | ICD-10-CM | POA: Diagnosis not present

## 2021-03-30 DIAGNOSIS — L03115 Cellulitis of right lower limb: Secondary | ICD-10-CM | POA: Diagnosis not present

## 2021-03-30 DIAGNOSIS — F32A Depression, unspecified: Secondary | ICD-10-CM | POA: Diagnosis not present

## 2021-03-31 ENCOUNTER — Other Ambulatory Visit: Payer: Self-pay | Admitting: Family Medicine

## 2021-03-31 DIAGNOSIS — E7800 Pure hypercholesterolemia, unspecified: Secondary | ICD-10-CM

## 2021-03-31 DIAGNOSIS — M810 Age-related osteoporosis without current pathological fracture: Secondary | ICD-10-CM

## 2021-03-31 DIAGNOSIS — D649 Anemia, unspecified: Secondary | ICD-10-CM

## 2021-04-03 DIAGNOSIS — M94261 Chondromalacia, right knee: Secondary | ICD-10-CM | POA: Diagnosis not present

## 2021-04-03 DIAGNOSIS — F32A Depression, unspecified: Secondary | ICD-10-CM | POA: Diagnosis not present

## 2021-04-03 DIAGNOSIS — I10 Essential (primary) hypertension: Secondary | ICD-10-CM | POA: Diagnosis not present

## 2021-04-03 DIAGNOSIS — A419 Sepsis, unspecified organism: Secondary | ICD-10-CM | POA: Diagnosis not present

## 2021-04-03 DIAGNOSIS — K219 Gastro-esophageal reflux disease without esophagitis: Secondary | ICD-10-CM | POA: Diagnosis not present

## 2021-04-03 DIAGNOSIS — L03115 Cellulitis of right lower limb: Secondary | ICD-10-CM | POA: Diagnosis not present

## 2021-04-03 NOTE — Progress Notes (Signed)
Referring Provider Tonia Ghent, MD [place],  [place] [id number]   CC:  Chief Complaint  Patient presents with   Follow-up      [name] Price is an 81 y.o. female.  HPI: Patient is a 81 year old female with PMH of right foot cellulitis s/p debridement with placement of wound matrix and wound VAC 11/08/2020 who then had STSG 01/13/2021 with Dr. Claudia Desanctis who presents to clinic for postoperative follow-up.  Patient was last seen here in clinic on 03/16/2021.  At that time, she continued to complain of considerable itching.  As for her wound, there were 2 areas that had not yet fully epithelialized.  In fact, they were slightly larger since last encounter.  Excellent granulation noted however.  Healthy appearing.  Pedal pulse intact.  Wounds were noted to be 4 x 3 cm over lateral malleolus and 6 x 3 cm distally.  Discussed with Dr. Claudia Desanctis, he suspected possible friction from footwear.  Transitioned her to collagen dressing changes changed every other day followed by gauze, Kerlix, and Ace wrap.  Today, patient states that home health nursing only is coming once a week now.  She continues to ensure that the dressings are changed every other day.  She is applying collagen followed by 4 x 4 gauze, Kerlix, ABD pad, and secured with Ace wrap.  She continues to wear appropriate footwear and focus on high-protein diet to help expedite healing.  She feels as though it is starting to get a little bit better, but not anything considerable.  Denies any worsening swelling, redness, or pain symptoms.    Allergies  Allergen Reactions   Codeine Nausea And Vomiting    Outpatient Encounter Medications as of 04/06/2021  Medication Sig   acetaminophen (TYLENOL) 500 MG tablet Take 500 mg by mouth daily as needed (pain).   B Complex Vitamins (VITAMIN B COMPLEX) TABS Take 1 tablet by mouth every morning.   Calcium Carbonate-Vitamin D (CALCIUM-D PO) Take 1 tablet by mouth 2 (two) times daily.    cetirizine (ZYRTEC) 10 MG chewable tablet Chew 10 mg by mouth daily.   Cholecalciferol (VITAMIN D-3 PO) Take 1 tablet by mouth every morning.   PARoxetine (PAXIL-CR) 25 MG 24 hr tablet Take 25 mg by mouth every morning.   acidophilus (RISAQUAD) CAPS capsule Take 1 capsule by mouth daily.   ferrous gluconate (FERGON) 324 MG tablet Take 1 tablet (324 mg total) by mouth 2 (two) times daily with a meal.   pantoprazole (PROTONIX) 40 MG tablet Take 1 tablet (40 mg total) by mouth 2 (two) times daily.   No facility-administered encounter medications on file as of 04/06/2021.     Past Medical History:  Diagnosis Date   Allergy    Anxiety    Arthritis    Blood transfusion without reported diagnosis    as baby   Breast cancer ([place]) 1999-2000   s/p lumpectomy and radiation, no chemo-right   History of chicken pox    History of hip replacement    Hot flashes    treated wtih paxil   Hyperlipidemia    Osteoporosis    DXA 11/12/17   UTI (urinary tract infection)     Past Surgical History:  Procedure Laterality Date   APPLICATION OF WOUND VAC Right 11/08/2020   Procedure: APPLICATION OF WOUND VAC;  Surgeon: [name], MD;  Location: [facility];  Service: Plastics;  Laterality: Right;   BREAST SURGERY Right    CATARACT  EXTRACTION W/PHACO Left 12/28/2014   Procedure: CATARACT EXTRACTION PHACO AND INTRAOCULAR LENS PLACEMENT (IOC);  Surgeon: Birder Robson, MD;  Location: [facility] OR[facility];  Service: Ophthalmology;  Laterality: Left;  [place] 00:38 AP% 19.9 CDE 7.70 fluid pack lot # [id number]   CATARACT EXTRACTION W/PHACO Right 07/10/2016   Procedure: CATARACT EXTRACTION PHACO AND INTRAOCULAR LENS PLACEMENT (IOC);  Surgeon: Birder Robson, MD;  Location: [facility] ORS;  Service: Ophthalmology;  Laterality: Right;  [place] 00:38 AP% 18.3 CDE 7.06 Fluid pack lot # [id number]   COLONOSCOPY  2016   DEBRIDEMENT AND CLOSURE WOUND Right 11/01/2020   Procedure: DEBRIDEMENT RIGHT FOOT WOUND;  Surgeon: [name], MD;   Location: [facility];  Service: Plastics;  Laterality: Right;  60 minutes   DEBRIDEMENT AND CLOSURE WOUND Right 11/08/2020   Procedure: Debridement of r. foot wound;  Surgeon: [name], MD;  Location: [facility];  Service: Plastics;  Laterality: Right;   EYE SURGERY     JOINT REPLACEMENT Bilateral    hip  / shoulder   OOPHORECTOMY Left    POLYPECTOMY     SKIN SPLIT GRAFT Right 01/13/2021   Procedure: SKIN GRAFT SPLIT THICKNESS;  Surgeon: [name], MD;  Location: [facility];  Service: Plastics;  Laterality: Right;   TOTAL HIP ARTHROPLASTY Bilateral    (2) Two   TOTAL SHOULDER REPLACEMENT      Family History  Problem Relation Age of Onset   Lung cancer Mother    Heart disease Mother    Colon cancer Mother        late 11's   Cancer Father    Colon cancer Father 18       died at 65   Breast cancer Maternal Aunt    Breast cancer Maternal Aunt    Breast cancer Maternal Aunt    Rectal cancer Neg Hx    Stomach cancer Neg Hx    Crohn's disease Neg Hx    Esophageal cancer Neg Hx     Social History   Social History Narrative   Married 1963   Likes to travel   2 daughters     Review of Systems General: Denies fevers or chills Skin: Endorses healing and wound shrinkage. Denies worsening redness or drainage.  Physical Exam Vitals with BMI 01/13/2021 01/13/2021 01/13/2021  Height - - -  Weight - - -  BMI - - -  Systolic [phone]  Diastolic 76 76 65  Pulse 850 115 118    General:  No acute distress, nontoxic appearing  Respiratory: No increased work of breathing Neuro: Alert and oriented Psychiatric: Normal mood and affect  Skin: Wound to the right lateral malleolus is 3 x 4 cm, L-shaped pattern.  Wound that is distal over the lateral dorsal aspect of foot is measuring 6 x 3 cm.  Healthy-appearing granular tissue.  Only mildly advanced epithelialization since last encounter.  Pedal pulse intact.  No cellulitic findings.  Assessment/Plan  There was some  bleeding at time of dressing removal from her wounds.  This was despite saline being used to help moisten the dressing prior to removal.  Patient believes that this is because the home health nurse is now only coming once per week.  While her husband is able to assist her with her dressings, she suspected that perhaps it was a bit drier than usual.  We will transition to collagen followed by Xeroform, ABD, Kerlix, and Ace wrap.  Suspect the Xeroform will help keep  the wound sites moist and allow for easier dressing changes.  The lateral malleolus wound has improved and that there has been advancing epithelialization from the inferior aspect.  It is now an L shape rather than circle.  There has not been any considerable changes in the wound distally, but the granular tissue today appears very healthy.  Suspect that it will continue to improve, albeit slowly.  We will plan to see her again in 4 weeks.  At that time, hope that it will have started to shrink a bit more.  She will call should she develop any questions or concerns in the interim.  Krista Blue 04/06/2021, 12:56 PM

## 2021-04-04 ENCOUNTER — Telehealth: Payer: Self-pay

## 2021-04-04 NOTE — Telephone Encounter (Signed)
Patient called to say she has an appointment with [place] on Thursday.  She said she would like to know if we are going to keep her on the collagen strips.  Patient said the people at the home care place haven't ordered them and they aren't going to order them unless we are going to keep her on them.  She said she has only had them two times since she's been in.  Please call.

## 2021-04-05 ENCOUNTER — Other Ambulatory Visit (INDEPENDENT_AMBULATORY_CARE_PROVIDER_SITE_OTHER): Payer: Medicare Other

## 2021-04-05 ENCOUNTER — Other Ambulatory Visit: Payer: Self-pay

## 2021-04-05 DIAGNOSIS — E7800 Pure hypercholesterolemia, unspecified: Secondary | ICD-10-CM

## 2021-04-05 DIAGNOSIS — M810 Age-related osteoporosis without current pathological fracture: Secondary | ICD-10-CM

## 2021-04-05 DIAGNOSIS — D649 Anemia, unspecified: Secondary | ICD-10-CM | POA: Diagnosis not present

## 2021-04-05 LAB — COMPREHENSIVE METABOLIC PANEL
ALT: 13 U/L (ref 0–35)
AST: 20 U/L (ref 0–37)
Albumin: 4.1 g/dL (ref 3.5–5.2)
Alkaline Phosphatase: 61 U/L (ref 39–117)
BUN: 24 mg/dL — ABNORMAL HIGH (ref 6–23)
CO2: 31 mEq/L (ref 19–32)
Calcium: 9.7 mg/dL (ref 8.4–10.5)
Chloride: 103 mEq/L (ref 96–112)
Creatinine, Ser: 0.64 mg/dL (ref 0.40–1.20)
GFR: 84.76 mL/min (ref >60.00)
Glucose, Bld: 81 mg/dL (ref 70–99)
Potassium: 4.4 mEq/L (ref 3.5–5.1)
Sodium: 139 mEq/L (ref 135–145)
Total Bilirubin: 0.8 mg/dL (ref 0.2–1.2)
Total Protein: 6.7 g/dL (ref 6.0–8.3)

## 2021-04-05 LAB — CBC WITH DIFFERENTIAL/PLATELET
Basophils Absolute: 0 10*3/uL (ref 0.0–0.1)
Basophils Relative: 0.5 % (ref 0.0–3.0)
Eosinophils Absolute: 0.2 10*3/uL (ref 0.0–0.7)
Eosinophils Relative: 2.6 % (ref 0.0–5.0)
HCT: 42.1 % (ref 36.0–46.0)
Hemoglobin: 13.7 g/dL (ref 12.0–15.0)
Lymphocytes Relative: 25.6 % (ref 12.0–46.0)
Lymphs Abs: 1.7 10*3/uL (ref 0.7–4.0)
MCHC: 32.6 g/dL (ref 30.0–36.0)
MCV: 88.8 fl (ref 78.0–100.0)
Monocytes Absolute: 0.5 10*3/uL (ref 0.1–1.0)
Monocytes Relative: 7.2 % (ref 3.0–12.0)
Neutro Abs: 4.2 10*3/uL (ref 1.4–7.7)
Neutrophils Relative %: 64.1 % (ref 43.0–77.0)
Platelets: 285 10*3/uL (ref 150.0–400.0)
RBC: 4.74 Mil/uL (ref 3.87–5.11)
RDW: 15.4 % (ref 11.5–15.5)
WBC: 6.6 10*3/uL (ref 4.0–10.5)

## 2021-04-05 LAB — LIPID PANEL
Cholesterol: 202 mg/dL — ABNORMAL HIGH (ref 0–200)
HDL: 55.4 mg/dL (ref >39.00)
LDL Cholesterol: 132 mg/dL — ABNORMAL HIGH (ref 0–99)
NonHDL: 146.24
Total CHOL/HDL Ratio: 4
Triglycerides: 71 mg/dL (ref 0.0–149.0)
VLDL: 14.2 mg/dL (ref 0.0–40.0)

## 2021-04-05 LAB — IRON: Iron: 94 ug/dL (ref 42–145)

## 2021-04-05 LAB — VITAMIN D 25 HYDROXY (VIT D DEFICIENCY, FRACTURES): VITD: 70.02 ng/mL (ref 30.00–100.00)

## 2021-04-06 ENCOUNTER — Ambulatory Visit (INDEPENDENT_AMBULATORY_CARE_PROVIDER_SITE_OTHER): Payer: Medicare Other | Admitting: Physician Assistant

## 2021-04-06 ENCOUNTER — Telehealth: Payer: Medicare Other

## 2021-04-06 DIAGNOSIS — S91301D Unspecified open wound, right foot, subsequent encounter: Secondary | ICD-10-CM

## 2021-04-07 ENCOUNTER — Telehealth: Payer: Self-pay | Admitting: Family Medicine

## 2021-04-07 NOTE — Telephone Encounter (Signed)
-----   Message from Tonia Ghent, MD sent at 03/30/2021  6:59 PM EST ----- Please schedule OV with me when possible.  It looks like she has a telephone f/u scheduled but not an OV.  Would need 102min.    Thanks.   Brigitte Pulse

## 2021-04-07 NOTE — Telephone Encounter (Signed)
Called pt and got her scheduled for 3/13 @11 

## 2021-04-10 ENCOUNTER — Telehealth: Payer: Self-pay

## 2021-04-10 DIAGNOSIS — L03115 Cellulitis of right lower limb: Secondary | ICD-10-CM | POA: Diagnosis not present

## 2021-04-10 DIAGNOSIS — K219 Gastro-esophageal reflux disease without esophagitis: Secondary | ICD-10-CM | POA: Diagnosis not present

## 2021-04-10 DIAGNOSIS — M94261 Chondromalacia, right knee: Secondary | ICD-10-CM | POA: Diagnosis not present

## 2021-04-10 DIAGNOSIS — A419 Sepsis, unspecified organism: Secondary | ICD-10-CM | POA: Diagnosis not present

## 2021-04-10 DIAGNOSIS — F32A Depression, unspecified: Secondary | ICD-10-CM | POA: Diagnosis not present

## 2021-04-10 DIAGNOSIS — I10 Essential (primary) hypertension: Secondary | ICD-10-CM | POA: Diagnosis not present

## 2021-04-10 NOTE — Telephone Encounter (Signed)
Faxed order to [facility] to change every other day : Collagen, Xeroform, Gauze, ABD pads, Kerlix,  and ACE wrap

## 2021-04-10 NOTE — Telephone Encounter (Signed)
Noted. Thanks.

## 2021-04-11 NOTE — Telephone Encounter (Signed)
Faxed order to [facility] yesterday with order. See previous telephone message.

## 2021-04-17 DIAGNOSIS — F32A Depression, unspecified: Secondary | ICD-10-CM | POA: Diagnosis not present

## 2021-04-17 DIAGNOSIS — I10 Essential (primary) hypertension: Secondary | ICD-10-CM | POA: Diagnosis not present

## 2021-04-17 DIAGNOSIS — M94261 Chondromalacia, right knee: Secondary | ICD-10-CM | POA: Diagnosis not present

## 2021-04-17 DIAGNOSIS — K219 Gastro-esophageal reflux disease without esophagitis: Secondary | ICD-10-CM | POA: Diagnosis not present

## 2021-04-17 DIAGNOSIS — L03115 Cellulitis of right lower limb: Secondary | ICD-10-CM | POA: Diagnosis not present

## 2021-04-17 DIAGNOSIS — A419 Sepsis, unspecified organism: Secondary | ICD-10-CM | POA: Diagnosis not present

## 2021-04-19 DIAGNOSIS — K5730 Diverticulosis of large intestine without perforation or abscess without bleeding: Secondary | ICD-10-CM | POA: Diagnosis not present

## 2021-04-19 DIAGNOSIS — K219 Gastro-esophageal reflux disease without esophagitis: Secondary | ICD-10-CM | POA: Diagnosis not present

## 2021-04-19 DIAGNOSIS — F419 Anxiety disorder, unspecified: Secondary | ICD-10-CM | POA: Diagnosis not present

## 2021-04-19 DIAGNOSIS — F32A Depression, unspecified: Secondary | ICD-10-CM | POA: Diagnosis not present

## 2021-04-19 DIAGNOSIS — E7800 Pure hypercholesterolemia, unspecified: Secondary | ICD-10-CM | POA: Diagnosis not present

## 2021-04-19 DIAGNOSIS — Z853 Personal history of malignant neoplasm of breast: Secondary | ICD-10-CM | POA: Diagnosis not present

## 2021-04-19 DIAGNOSIS — D649 Anemia, unspecified: Secondary | ICD-10-CM | POA: Diagnosis not present

## 2021-04-19 DIAGNOSIS — Z96643 Presence of artificial hip joint, bilateral: Secondary | ICD-10-CM | POA: Diagnosis not present

## 2021-04-19 DIAGNOSIS — H8110 Benign paroxysmal vertigo, unspecified ear: Secondary | ICD-10-CM | POA: Diagnosis not present

## 2021-04-19 DIAGNOSIS — L03115 Cellulitis of right lower limb: Secondary | ICD-10-CM | POA: Diagnosis not present

## 2021-04-19 DIAGNOSIS — M67439 Ganglion, unspecified wrist: Secondary | ICD-10-CM | POA: Diagnosis not present

## 2021-04-19 DIAGNOSIS — I471 Supraventricular tachycardia: Secondary | ICD-10-CM | POA: Diagnosis not present

## 2021-04-19 DIAGNOSIS — M810 Age-related osteoporosis without current pathological fracture: Secondary | ICD-10-CM | POA: Diagnosis not present

## 2021-04-19 DIAGNOSIS — I10 Essential (primary) hypertension: Secondary | ICD-10-CM | POA: Diagnosis not present

## 2021-04-19 DIAGNOSIS — Z792 Long term (current) use of antibiotics: Secondary | ICD-10-CM | POA: Diagnosis not present

## 2021-04-19 DIAGNOSIS — M2241 Chondromalacia patellae, right knee: Secondary | ICD-10-CM | POA: Diagnosis not present

## 2021-04-24 ENCOUNTER — Other Ambulatory Visit: Payer: Self-pay

## 2021-04-24 ENCOUNTER — Encounter: Payer: Self-pay | Admitting: Family Medicine

## 2021-04-24 ENCOUNTER — Ambulatory Visit (INDEPENDENT_AMBULATORY_CARE_PROVIDER_SITE_OTHER): Payer: Medicare Other | Admitting: Family Medicine

## 2021-04-24 VITALS — BP 120/76 | HR 98 | Temp 98.2°F | Ht 63.0 in | Wt 148.0 lb

## 2021-04-24 DIAGNOSIS — D649 Anemia, unspecified: Secondary | ICD-10-CM

## 2021-04-24 DIAGNOSIS — M810 Age-related osteoporosis without current pathological fracture: Secondary | ICD-10-CM | POA: Diagnosis not present

## 2021-04-24 DIAGNOSIS — R232 Flushing: Secondary | ICD-10-CM

## 2021-04-24 DIAGNOSIS — Z7189 Other specified counseling: Secondary | ICD-10-CM

## 2021-04-24 DIAGNOSIS — S91301A Unspecified open wound, right foot, initial encounter: Secondary | ICD-10-CM

## 2021-04-24 MED ORDER — FERROUS GLUCONATE 324 (38 FE) MG PO TABS: 324.0000 mg | ORAL_TABLET | Freq: Every day | ORAL | Status: DC

## 2021-04-24 MED ORDER — PAROXETINE HCL ER 25 MG PO TB24: 25.0000 mg | ORAL_TABLET | Freq: Every morning | ORAL | 3 refills | Status: DC

## 2021-04-24 NOTE — Progress Notes (Unsigned)
This visit occurred during the SARS-CoV-2 public health emergency.  Safety protocols were in place, including screening questions prior to the visit, additional usage of staff PPE, and extensive cleaning of exam room while observing appropriate contact time as indicated for disinfecting solutions.  R foot bandaged at baseline with f/u pending.  Prev wound treatment d/w pt.  S/p skin graft x2.  HH is still coming out 1 time per week.    She wants to travel to [place] to see her grandson this fall.  She sent off to get her passport updated.    H/o paxil use for hot flashes but it helped with anxiety. She wanted to continue med.    H/o osteoporosis.  She couldn't tolerate fosamax.  Would defer tx until his foot is healed.    Defer shingrix until foot is healed.  D/w pt.   Living will d/w pt.  Husband designated if patient were incapacitated.    H/o ABLA and HGB clearly better in the meantime.  Labs d/w pt.  Prev ALBA attributed to wound and prev treatment.    Meds, vitals, and allergies reviewed.   ROS: Per HPI unless specifically indicated in ROS section

## 2021-04-24 NOTE — Patient Instructions (Signed)
Stop iron.  Recheck labs in about 3 months.  Nonfasting lab visit.  ?Take care.  Glad to see you. ?Thanks for your effort.   ?

## 2021-04-26 ENCOUNTER — Telehealth: Payer: Self-pay | Admitting: *Deleted

## 2021-04-26 NOTE — Assessment & Plan Note (Signed)
Living will d/w pt.  Husband designated if patient were incapacitated.  

## 2021-04-26 NOTE — Assessment & Plan Note (Signed)
Previously improved on Paxil but it also help with anxiety and it would be reasonable to continue as is for now.  She agrees. ?

## 2021-04-26 NOTE — Assessment & Plan Note (Signed)
H/o osteoporosis.  She couldn't tolerate fosamax.  Would defer tx until his foot is healed.  Discussed.  She agrees. ?

## 2021-04-26 NOTE — Chronic Care Management (AMB) (Signed)
?  Care Management  ? ?Note ? ?04/26/2021 ?Name: [name] Price MRN: [id number] DOB: 1940-12-28 ? ?[name] Price is a 81 y.o. year old female who is a primary care patient of Tonia Ghent, MD and is actively engaged with the care management team. I reached out to Joella Prince by phone today to assist with re-scheduling a follow up visit with the RN Case Manager ? ?Follow up plan: ?Patient declines further follow up and engagement by the care management team. Appropriate care team members and provider have been notified via electronic communication.  ? ?[name] Price, CCMA ?Care Guide, Embedded Care Coordination ?[facility]  Care Management  ?Direct Dial: [phone] ? ? ?

## 2021-04-26 NOTE — Telephone Encounter (Signed)
Received on (04/25/21) via of fax Client Coordination Note Report from [facility].  Requesting review,signature,date,and return.  Given to provider to sign and return.   ? ?Client Coordination Note Report signed and faxed back to [facility]. Confirmation received and copy scanned into the chart.//AB/CMA   ?

## 2021-04-26 NOTE — Assessment & Plan Note (Signed)
Slowly healing, with follow-up at outside clinic.  I will defer.  She agrees. ?

## 2021-04-26 NOTE — Assessment & Plan Note (Signed)
?  H/o ABLA and HGB clearly better in the meantime.  Labs d/w pt.  Prev ALBA attributed to wound and prev treatment.   ?Stop iron.  Recheck labs in about 3 months.   ?

## 2021-04-27 ENCOUNTER — Telehealth: Payer: Medicare Other

## 2021-04-27 DIAGNOSIS — I10 Essential (primary) hypertension: Secondary | ICD-10-CM | POA: Diagnosis not present

## 2021-04-27 DIAGNOSIS — K219 Gastro-esophageal reflux disease without esophagitis: Secondary | ICD-10-CM | POA: Diagnosis not present

## 2021-04-27 DIAGNOSIS — M2241 Chondromalacia patellae, right knee: Secondary | ICD-10-CM | POA: Diagnosis not present

## 2021-04-27 DIAGNOSIS — H8110 Benign paroxysmal vertigo, unspecified ear: Secondary | ICD-10-CM | POA: Diagnosis not present

## 2021-04-27 DIAGNOSIS — L03115 Cellulitis of right lower limb: Secondary | ICD-10-CM | POA: Diagnosis not present

## 2021-04-27 DIAGNOSIS — F32A Depression, unspecified: Secondary | ICD-10-CM | POA: Diagnosis not present

## 2021-04-28 NOTE — Progress Notes (Signed)
? ?Referring Provider ?Tonia Ghent, MD ?[address] ?[place],  [place] [address]  ? ?CC:  ?Chief Complaint  ?Patient presents with  ? Follow-up  ?   ? ?[name] Price is an 81 y.o. female.  ?HPI: Patient is a very pleasant 81 year old female with PMH of right foot cellulitis s/p debridement with placement of wound matrix and wound VAC 11/08/2020 who then had STSG 01/13/2021 with Dr. Claudia Desanctis who presents to clinic for postoperative follow-up. ? ?She was last seen here in clinic on 04/06/2021.  At that time, home health nursing was now only coming once per week.  She continues to ensure that the dressing changes are changed every other day however.  She applies collagen followed by 4 x 4 gauze, Kerlix, ABD pad, and secured with Ace wrap.  She continues to wear appropriate footwear and focus on high-protein diet.  Physical exam is reassuring.  3 x 4 cm L-shaped wound over right lateral malleolus as well as a 6 x 3 cm wound on lateral dorsum of foot.  Healthy-appearing granular tissue noted.  Decided to transition patient to collagen followed by Xeroform, ABD, Kerlix, and Ace wrap with the hope that the Xeroform will help keep the wound site moist and allow for easier dressing changes. ? ?Today, patient is doing well.  She states that [facility] now is only coming once every 2 weeks given her improvement.  Her husband has been excellent with her dressing changes, performed every other day.  She was able to purchase stocking dressings on [employer] as an alternative to an Ace wrap which has been less bulky and better tolerated by patient.  She states that the wound on dorsum of foot has continued to get smaller and that the ankle wound has almost healed entirely.  Denies any worsening pain or systemic symptoms.  She is hoping that will heal shortly so that she can go to the beach this summer.  She also has plans to go to [place] in the fall. ? ? ?Allergies  ?Allergen Reactions  ? Codeine Nausea And Vomiting  ? Fosamax  [Alendronate]   ?  GI intolerance and "didn't feel well on medicine."    ? ? ?Outpatient Encounter Medications as of 05/04/2021  ?Medication Sig  ? acetaminophen (TYLENOL) 500 MG tablet Take 500 mg by mouth daily as needed (pain).  ? B Complex Vitamins (VITAMIN B COMPLEX) TABS Take 1 tablet by mouth every morning.  ? Calcium Carbonate-Vitamin D (CALCIUM-D PO) Take 1 tablet by mouth 2 (two) times daily.  ? cetirizine (ZYRTEC) 10 MG chewable tablet Chew 10 mg by mouth daily.  ? Cholecalciferol (VITAMIN D-3 PO) Take 1 tablet by mouth every morning.  ? PARoxetine (PAXIL-CR) 25 MG 24 hr tablet Take 1 tablet (25 mg total) by mouth every morning.  ? ?No facility-administered encounter medications on file as of 05/04/2021.  ?  ? ?Past Medical History:  ?Diagnosis Date  ? Allergy   ? Anxiety   ? Arthritis   ? Blood transfusion without reported diagnosis   ? as baby  ? Breast cancer ([place]) 1999-2000  ? s/p lumpectomy and radiation, no chemo-right  ? History of chicken pox   ? History of hip replacement   ? Hot flashes   ? treated wtih paxil  ? Hyperlipidemia   ? Osteoporosis   ? DXA 11/12/17  ? UTI (urinary tract infection)   ? ? ?Past Surgical History:  ?Procedure Laterality Date  ? APPLICATION OF WOUND  VAC Right 11/08/2020  ? Procedure: APPLICATION OF WOUND VAC;  Surgeon: [name], MD;  [facility]: [facility];  Service: Plastics;  Laterality: Right;  ? BREAST SURGERY Right   ? CATARACT EXTRACTION W/PHACO Left 12/28/2014  ? Procedure: CATARACT EXTRACTION PHACO AND INTRAOCULAR LENS PLACEMENT (IOC);  Surgeon: Birder Robson, MD;  Location: [facility];  Service: Ophthalmology;  Laterality: Left;  [place] 00:38 ?AP% 19.9 ?CDE 7.70 ?fluid pack lot # [id number]  ? CATARACT EXTRACTION W/PHACO Right 07/10/2016  ? Procedure: CATARACT EXTRACTION PHACO AND INTRAOCULAR LENS PLACEMENT (IOC);  Surgeon: Birder Robson, MD;  Location: [facility];  Service: Ophthalmology;  Laterality: Right;  [place] 00:38 ?AP% 18.3 ?CDE 7.06 ?Fluid pack lot # [id number]  ?  COLONOSCOPY  2016  ? DEBRIDEMENT AND CLOSURE WOUND Right 11/01/2020  ? Procedure: DEBRIDEMENT RIGHT FOOT WOUND;  Surgeon: [name], MD;  [facility];  Service: Plastics;  Laterality: Right;  60 minutes  ? DEBRIDEMENT AND CLOSURE WOUND Right 11/08/2020  ? Procedure: Debridement of r. foot wound;  Surgeon: [name], MD;  [facility];  Service: Plastics;  Laterality: Right;  ? EYE SURGERY    ? JOINT REPLACEMENT Bilateral   ? hip  / shoulder  ? OOPHORECTOMY Left   ? POLYPECTOMY    ? SKIN SPLIT GRAFT Right 01/13/2021  ? Procedure: SKIN GRAFT SPLIT THICKNESS;  Surgeon: [name], MD;  Location: [facility];  Service: Plastics;  Laterality: Right;  ? TOTAL HIP ARTHROPLASTY Bilateral   ? (2) Two  ? TOTAL SHOULDER REPLACEMENT    ? ? ?Family History  ?Problem Relation Age of Onset  ? Lung cancer Mother   ? Heart disease Mother   ? Colon cancer Mother   ?     late 57's  ? Cancer Father   ? Colon cancer Father 27  ?     died at 8  ? Breast cancer Maternal Aunt   ? Breast cancer Maternal Aunt   ? Breast cancer Maternal Aunt   ? Rectal cancer Neg Hx   ? Stomach cancer Neg Hx   ? Crohn's disease Neg Hx   ? Esophageal cancer Neg Hx   ? ? ?Social History  ? ?Social History Narrative  ? Married 1963  ? Likes to travel  ? 2 daughters  ?  ? ?Review of Systems ?General: Denies fevers or chills ?Skin: Endorses wound shrinkage.  Denies any new wounds or purulent drainage. ?MSK: Ambulatory. ? ?Physical Exam ? ?  04/24/2021  ? 11:19 AM 04/24/2021  ? 11:10 AM 01/13/2021  ?  4:07 PM  ?Vitals with BMI  ?Height  '5\' 3"'$    ?Weight  148 lbs   ?BMI  26.22   ?Systolic  468 032  ?Diastolic  76 76  ?Pulse 98 110 108  ?  ?General:  No acute distress, nontoxic appearing  ?Respiratory: No increased work of breathing ?Neuro: Alert and oriented ?Psychiatric: Normal mood and affect  ?Skin: Wound on dorsum of foot has shrunk to approximately 3 x 3.5 cm.  Healthy appearing granular tissue.  No significant  hypergranulation.  No drainage.  Wound on lateral malleolus has almost entirely epithelialized.  Mild scabbing noted. ? ?Assessment/Plan ? ?No cellulitic findings.  Wounds are healing well, continued advancing epithelialization and drainage.  Exam entirely reassuring.  Functionally and neurovascularly intact. ? ?Continue with dressing changes every other day.  Collagen followed by Xeroform, 4 x 4 gauze, Kerlix, and secured with the netted  stocking dressing that she purchased on [employer]. ? ?Return to clinic in 4 weeks.  Suspect that she may be almost entirely healed at that time.  Picture(s) obtained of the patient and placed in the chart were with the patient's or guardian's permission. ? ? ?Krista Blue ?05/04/2021, 1:27 PM  ? ? ?  ? ?

## 2021-05-01 ENCOUNTER — Ambulatory Visit: Payer: Self-pay

## 2021-05-01 DIAGNOSIS — F32A Depression, unspecified: Secondary | ICD-10-CM | POA: Diagnosis not present

## 2021-05-01 DIAGNOSIS — M2241 Chondromalacia patellae, right knee: Secondary | ICD-10-CM | POA: Diagnosis not present

## 2021-05-01 DIAGNOSIS — S91301A Unspecified open wound, right foot, initial encounter: Secondary | ICD-10-CM

## 2021-05-01 DIAGNOSIS — I10 Essential (primary) hypertension: Secondary | ICD-10-CM

## 2021-05-01 DIAGNOSIS — L03115 Cellulitis of right lower limb: Secondary | ICD-10-CM | POA: Diagnosis not present

## 2021-05-01 DIAGNOSIS — H8110 Benign paroxysmal vertigo, unspecified ear: Secondary | ICD-10-CM | POA: Diagnosis not present

## 2021-05-01 DIAGNOSIS — K219 Gastro-esophageal reflux disease without esophagitis: Secondary | ICD-10-CM | POA: Diagnosis not present

## 2021-05-01 NOTE — Chronic Care Management (AMB) (Signed)
?Chronic Care Management  ? ?CCM RN Visit Note ? ?05/01/2021 ?Name: [name] Price MRN: [id number] DOB: [birth year] ? ?Subjective: ?[name] Price is a 81 y.o. year old female who is a primary care patient of Tonia Ghent, MD. The care management team was consulted for assistance with disease management and care coordination needs.   ? ?Engaged with patient by telephone for follow up visit in response to provider referral for case management and/or care coordination services.  ? ?Consent to Services:  ?The patient was given information about Chronic Care Management services, agreed to services, and gave verbal consent prior to initiation of services.  Please see initial visit note for detailed documentation.  ? ?Patient agreed to services and verbal consent obtained.  ? ?Assessment: Review of patient past medical history, allergies, medications, health status, including review of consultants reports, laboratory and other test data, was performed as part of comprehensive evaluation and provision of chronic care management services.  ? ?SDOH (Social Determinants of Health) assessments and interventions performed:   ? ?CCM Care Plan ? ?Allergies  ?Allergen Reactions  ? Codeine Nausea And Vomiting  ? Fosamax [Alendronate]   ?  GI intolerance and "didn't feel well on medicine."    ? ? ?Outpatient Encounter Medications as of 05/01/2021  ?Medication Sig  ? acetaminophen (TYLENOL) 500 MG tablet Take 500 mg by mouth daily as needed (pain).  ? B Complex Vitamins (VITAMIN B COMPLEX) TABS Take 1 tablet by mouth every morning.  ? Calcium Carbonate-Vitamin D (CALCIUM-D PO) Take 1 tablet by mouth 2 (two) times daily.  ? cetirizine (ZYRTEC) 10 MG chewable tablet Chew 10 mg by mouth daily.  ? Cholecalciferol (VITAMIN D-3 PO) Take 1 tablet by mouth every morning.  ? PARoxetine (PAXIL-CR) 25 MG 24 hr tablet Take 1 tablet (25 mg total) by mouth every morning.  ? ?No facility-administered encounter medications on file as of  05/01/2021.  ? ? ?Patient Active Problem List  ? Diagnosis Date Noted  ? Gastroesophageal reflux disease   ? Depression   ? Wound of right foot 10/29/2020  ? Anemia 10/29/2020  ? HTN (hypertension) 10/29/2020  ? Rash 09/05/2020  ? History of hip replacement   ? Advance care planning 06/25/2017  ? Health care maintenance 06/25/2017  ? Hot flashes 06/25/2017  ? BPV (benign positional vertigo) 03/15/2015  ? Ganglion cyst of wrist 02/23/2015  ? Extensor tendon rupture of hand 02/01/2015  ? Left wrist effusion 01/26/2015  ? KNEE PAIN, RIGHT 11/13/2007  ? CHONDROMALACIA PATELLA, LEFT 03/19/2007  ? Osteoporosis 03/19/2007  ? HEMATURIA, HX OF 03/19/2007  ? DIVERTICULOSIS, COLON 08/10/2002  ? HYPERCHOLESTEROLEMIA 06/05/1996  ? ? ?Conditions to be addressed/monitored:HTN and foot wound ? ?Care Plan : RN- Plan of care  ?Updates made by Dannielle Karvonen, RN since 05/01/2021 12:00 AM  ?  ? ?Problem: Development of care plan for management of impaired tissue/skin/ wound integrity in patient with chronic conditions ( HTN,)   ?Priority: High  ?  ? ?Long-Range Goal: improved skin integrity and management of chronic conditions   ?Start Date: 11/28/2020  ?Expected End Date: 06/09/2021  ?Recent Progress: On track  ?Priority: High  ?Note:   ?Goals resolved.  Patient refused case management services. ? ?Current Barriers:  ?Knowledge Deficits related to plan of care for management of HTN and skin integrity ? ?RNCM Clinical Goal(s):  ?Patient will verbalize understanding of plan for management of HTN and skin integrity  through collaboration with RN Care manager, provider, and  care team.  ? ?Interventions: ?1:1 collaboration with primary care provider regarding development and update of comprehensive plan of care as evidenced by provider attestation and co-signature ?Inter-disciplinary care team collaboration (see longitudinal plan of care) ?Evaluation of current treatment plan related to  self management and patient's adherence to plan as  established by provider ? ?Hypertension Interventions:  New Goal ?Last practice recorded BP readings:  ?BP Readings from Last 3 Encounters:  ?11/10/20 (!) 101/52  ?10/19/20 100/71  ?10/07/20 (!) 112/50  ?Most recent eGFR/CrCl: No results found for: EGFR  No components found for: CRCL ? ?Evaluation of current treatment plan related for hypertension self management and patient's adherence to plan as established by provider; ?Reviewed medications with patient and discussed importance of compliance; ?Discussed plans with patient for ongoing care management follow up and provided patient with direct contact information for care management team; ?Advised patient, providing education and rationale, to monitor blood pressure 2-3 times per month and record, calling PCP for findings outside established parameters;  ?Reviewed scheduled/upcoming provider appointments ?Advised patient to call provider office to scheduled annual wellness visit.  Per chart review last annual wellness visit was 06/26/2018.  ? ? ? ?Skin Integrity Interventions:  Goal on track:  Yes. Long term goal ?Evaluation of current treatment plan related for  skin integrity  , self-management and patient's adherence to plan as established by provider. ?Discussed plans with patient for ongoing care management follow up and provided patient with direct contact information for care management team ?Reviewed medications with patient and discussed importance of compliance ?Reviewed scheduled/upcoming provider appointments  ?Discussed signs/ symptoms of infection.  Advised to notify provider of symptoms as soon as possible.  ?Advised patient to continue to work with home health regarding wound care ?Called [facility] home health to follow up on patients dressing supplies  ?Provided patient with education information through [facility].  Assisted patient with sending message to primary provider regarding need for Hepatic function panel requested by her plastic surgeon,  Dr. Claudia Desanctis ?Advised to follow up with providers as recommended.  ? ?Patient Goals/Self-Care Activities: ?Take your medications as prescribed and refill timely ?Attend all scheduled provider appointments ?Call provider office for new concerns or questions ?Continue to work with the home health agency in management of wound care.  Call if you have questions ?Avoid rubbing and scratching areas near/ around wound.  Rubbing and scratching can cause further injury and delay healing ?Eat a diet that promotes healing ( high protein, calorie diet)  ?Notify doctor for signs/ symptoms of infection ( increased wound drainage, increasing pain, local warmth and redness, fever) ?Call your RN case manager if you do not receive your dressing supplies by 03/31/2020. ?Send message to your primary doctor regarding requested hepatic function panel request from Dr. Claudia Desanctis.  ? ? ?  ? ? ?Plan:No further follow up required: Patient refused case management services.  ?Quinn Plowman RN,BSN,CCM ?RN Case Manager ?[place]  ?[phone] ? ? ? ? ? ? ? ? ? ?

## 2021-05-01 NOTE — Patient Instructions (Signed)
Visit Information  Thank you for allowing me to share the care management and care coordination services that are available to you as part of your health plan and services through your primary care provider and medical home. Please reach out to me at [phone]  if the care management/care coordination team may be of assistance to you in the future.   Cambre Matson RN,BSN,CCM RN Case Manager [facility]  [phone]  

## 2021-05-04 ENCOUNTER — Ambulatory Visit (INDEPENDENT_AMBULATORY_CARE_PROVIDER_SITE_OTHER): Payer: Medicare Other | Admitting: Physician Assistant

## 2021-05-04 ENCOUNTER — Other Ambulatory Visit: Payer: Self-pay

## 2021-05-04 DIAGNOSIS — S91301D Unspecified open wound, right foot, subsequent encounter: Secondary | ICD-10-CM | POA: Diagnosis not present

## 2021-05-08 DIAGNOSIS — Z01419 Encounter for gynecological examination (general) (routine) without abnormal findings: Secondary | ICD-10-CM | POA: Diagnosis not present

## 2021-05-08 DIAGNOSIS — Z6824 Body mass index (BMI) 24.0-24.9, adult: Secondary | ICD-10-CM | POA: Diagnosis not present

## 2021-05-15 DIAGNOSIS — I10 Essential (primary) hypertension: Secondary | ICD-10-CM | POA: Diagnosis not present

## 2021-05-15 DIAGNOSIS — L03115 Cellulitis of right lower limb: Secondary | ICD-10-CM | POA: Diagnosis not present

## 2021-05-15 DIAGNOSIS — M2241 Chondromalacia patellae, right knee: Secondary | ICD-10-CM | POA: Diagnosis not present

## 2021-05-15 DIAGNOSIS — H8110 Benign paroxysmal vertigo, unspecified ear: Secondary | ICD-10-CM | POA: Diagnosis not present

## 2021-05-15 DIAGNOSIS — K219 Gastro-esophageal reflux disease without esophagitis: Secondary | ICD-10-CM | POA: Diagnosis not present

## 2021-05-15 DIAGNOSIS — F32A Depression, unspecified: Secondary | ICD-10-CM | POA: Diagnosis not present

## 2021-05-19 DIAGNOSIS — M2241 Chondromalacia patellae, right knee: Secondary | ICD-10-CM | POA: Diagnosis not present

## 2021-05-19 DIAGNOSIS — D649 Anemia, unspecified: Secondary | ICD-10-CM | POA: Diagnosis not present

## 2021-05-19 DIAGNOSIS — Z792 Long term (current) use of antibiotics: Secondary | ICD-10-CM | POA: Diagnosis not present

## 2021-05-19 DIAGNOSIS — M67439 Ganglion, unspecified wrist: Secondary | ICD-10-CM | POA: Diagnosis not present

## 2021-05-19 DIAGNOSIS — K219 Gastro-esophageal reflux disease without esophagitis: Secondary | ICD-10-CM | POA: Diagnosis not present

## 2021-05-19 DIAGNOSIS — H8110 Benign paroxysmal vertigo, unspecified ear: Secondary | ICD-10-CM | POA: Diagnosis not present

## 2021-05-19 DIAGNOSIS — Z853 Personal history of malignant neoplasm of breast: Secondary | ICD-10-CM | POA: Diagnosis not present

## 2021-05-19 DIAGNOSIS — F32A Depression, unspecified: Secondary | ICD-10-CM | POA: Diagnosis not present

## 2021-05-19 DIAGNOSIS — I471 Supraventricular tachycardia: Secondary | ICD-10-CM | POA: Diagnosis not present

## 2021-05-19 DIAGNOSIS — F419 Anxiety disorder, unspecified: Secondary | ICD-10-CM | POA: Diagnosis not present

## 2021-05-19 DIAGNOSIS — E7800 Pure hypercholesterolemia, unspecified: Secondary | ICD-10-CM | POA: Diagnosis not present

## 2021-05-19 DIAGNOSIS — L03115 Cellulitis of right lower limb: Secondary | ICD-10-CM | POA: Diagnosis not present

## 2021-05-19 DIAGNOSIS — K5730 Diverticulosis of large intestine without perforation or abscess without bleeding: Secondary | ICD-10-CM | POA: Diagnosis not present

## 2021-05-19 DIAGNOSIS — M810 Age-related osteoporosis without current pathological fracture: Secondary | ICD-10-CM | POA: Diagnosis not present

## 2021-05-19 DIAGNOSIS — Z96643 Presence of artificial hip joint, bilateral: Secondary | ICD-10-CM | POA: Diagnosis not present

## 2021-05-19 DIAGNOSIS — I10 Essential (primary) hypertension: Secondary | ICD-10-CM | POA: Diagnosis not present

## 2021-05-29 DIAGNOSIS — L03115 Cellulitis of right lower limb: Secondary | ICD-10-CM | POA: Diagnosis not present

## 2021-05-29 DIAGNOSIS — F32A Depression, unspecified: Secondary | ICD-10-CM | POA: Diagnosis not present

## 2021-05-29 DIAGNOSIS — K219 Gastro-esophageal reflux disease without esophagitis: Secondary | ICD-10-CM | POA: Diagnosis not present

## 2021-05-29 DIAGNOSIS — H8110 Benign paroxysmal vertigo, unspecified ear: Secondary | ICD-10-CM | POA: Diagnosis not present

## 2021-05-29 DIAGNOSIS — M2241 Chondromalacia patellae, right knee: Secondary | ICD-10-CM | POA: Diagnosis not present

## 2021-05-29 DIAGNOSIS — I10 Essential (primary) hypertension: Secondary | ICD-10-CM | POA: Diagnosis not present

## 2021-06-01 ENCOUNTER — Ambulatory Visit (INDEPENDENT_AMBULATORY_CARE_PROVIDER_SITE_OTHER): Payer: Medicare Other | Admitting: Physician Assistant

## 2021-06-01 DIAGNOSIS — S91301D Unspecified open wound, right foot, subsequent encounter: Secondary | ICD-10-CM

## 2021-06-01 NOTE — Progress Notes (Signed)
? ?Referring Provider ?Tonia Ghent, MD ?[address] ?[place],  [place] [address]  ? ?CC:  ?Chief Complaint  ?Patient presents with  ? Follow-up  ? Skin Problem  ?   ? ?[name] Price is an 81 y.o. female.  ?HPI: Patient is a very pleasant 81 year old female with PMH of right foot cellulitis s/p debridement with placement of wound matrix and wound VAC 11/08/2020 who then had STSG 01/13/2021 with Dr. Claudia Desanctis who presents to clinic for postoperative follow-up. ? ?She was last seen here in clinic on 05/04/2021.  At that time, she felt as though the wound were continuing to shrink and denied any pain or systemic symptoms.  She was hopeful that she will go to the beach this summer and already has travel plans to [place] for the fall.  Wound on dorsum of foot has shrunk to approximately 3 x 3.5 cm with healthy appearing granular tissue.  Previous wound on lateral malleolus had almost entirely epithelialized. ? ?Today, patient is doing okay.  Patient notes that her ankle wound had completely healed and she was able to discontinue dressing changes to that area.  However, approximately 1 week ago she was out shopping at the store and after she came home she noticed that her wound over her ankle had returned.  As for the wound over dorsum of her foot, it has continued to get smaller.  She denies any pain symptoms.  She is simply frustrated given the chronicity of healing. ? ? ? ?Allergies  ?Allergen Reactions  ? Codeine Nausea And Vomiting  ? Fosamax [Alendronate]   ?  GI intolerance and "didn't feel well on medicine."    ? ? ?Outpatient Encounter Medications as of 06/01/2021  ?Medication Sig  ? acetaminophen (TYLENOL) 500 MG tablet Take 500 mg by mouth daily as needed (pain).  ? B Complex Vitamins (VITAMIN B COMPLEX) TABS Take 1 tablet by mouth every morning.  ? Calcium Carbonate-Vitamin D (CALCIUM-D PO) Take 1 tablet by mouth 2 (two) times daily.  ? cetirizine (ZYRTEC) 10 MG chewable tablet Chew 10 mg by mouth  daily.  ? Cholecalciferol (VITAMIN D-3 PO) Take 1 tablet by mouth every morning.  ? PARoxetine (PAXIL-CR) 25 MG 24 hr tablet Take 1 tablet (25 mg total) by mouth every morning.  ? ?No facility-administered encounter medications on file as of 06/01/2021.  ?  ? ?Past Medical History:  ?Diagnosis Date  ? Allergy   ? Anxiety   ? Arthritis   ? Blood transfusion without reported diagnosis   ? as baby  ? Breast cancer ([place]) 1999-2000  ? s/p lumpectomy and radiation, no chemo-right  ? History of chicken pox   ? History of hip replacement   ? Hot flashes   ? treated wtih paxil  ? Hyperlipidemia   ? Osteoporosis   ? DXA 11/12/17  ? UTI (urinary tract infection)   ? ? ?Past Surgical History:  ?Procedure Laterality Date  ? APPLICATION OF WOUND VAC Right 11/08/2020  ? Procedure: APPLICATION OF WOUND VAC;  Surgeon: [name], MD;  [facility];  Service: Plastics;  Laterality: Right;  ? BREAST SURGERY Right   ? CATARACT EXTRACTION W/PHACO Left 12/28/2014  ? Procedure: CATARACT EXTRACTION PHACO AND INTRAOCULAR LENS PLACEMENT (IOC);  Surgeon: Birder Robson, MD;  Location: [facility];  Service: Ophthalmology;  Laterality: Left;  [place] 00:38 ?AP% 19.9 ?CDE 7.70 ?fluid pack lot # [id number]  ? CATARACT EXTRACTION W/PHACO Right 07/10/2016  ? Procedure: CATARACT  EXTRACTION PHACO AND INTRAOCULAR LENS PLACEMENT (IOC);  Surgeon: Birder Robson, MD;  Location: [facility];  Service: Ophthalmology;  Laterality: Right;  [place] 00:38 ?AP% 18.3 ?CDE 7.06 ?Fluid pack lot # [id number]  ? COLONOSCOPY  2016  ? DEBRIDEMENT AND CLOSURE WOUND Right 11/01/2020  ? Procedure: DEBRIDEMENT RIGHT FOOT WOUND;  Surgeon: [name], MD;  [facility];  Service: Plastics;  Laterality: Right;  60 minutes  ? DEBRIDEMENT AND CLOSURE WOUND Right 11/08/2020  ? Procedure: Debridement of r. foot wound;  Surgeon: [name], MD;  [facility];  Service: Plastics;  Laterality: Right;  ? EYE SURGERY    ? JOINT REPLACEMENT Bilateral   ? hip  / shoulder  ?  OOPHORECTOMY Left   ? POLYPECTOMY    ? SKIN SPLIT GRAFT Right 01/13/2021  ? Procedure: SKIN GRAFT SPLIT THICKNESS;  Surgeon: [name], MD;  Location: [facility];  Service: Plastics;  Laterality: Right;  ? TOTAL HIP ARTHROPLASTY Bilateral   ? (2) Two  ? TOTAL SHOULDER REPLACEMENT    ? ? ?Family History  ?Problem Relation Age of Onset  ? Lung cancer Mother   ? Heart disease Mother   ? Colon cancer Mother   ?     late 2's  ? Cancer Father   ? Colon cancer Father 13  ?     died at 81  ? Breast cancer Maternal Aunt   ? Breast cancer Maternal Aunt   ? Breast cancer Maternal Aunt   ? Rectal cancer Neg Hx   ? Stomach cancer Neg Hx   ? Crohn's disease Neg Hx   ? Esophageal cancer Neg Hx   ? ? ?Social History  ? ?Social History Narrative  ? Married 1963  ? Likes to travel  ? 2 daughters  ?  ? ?Review of Systems ?General: Denies fevers or chills ?Cardio: Denies chest pain ?Skin: Endorses worsening wound over ankle, wound shrinkage over dorsum of foot ? ?Physical Exam ? ?  04/24/2021  ? 11:19 AM 04/24/2021  ? 11:10 AM 01/13/2021  ?  4:07 PM  ?Vitals with BMI  ?Height  '5\' 3"'$    ?Weight  148 lbs   ?BMI  26.22   ?Systolic  633 354  ?Diastolic  76 76  ?Pulse 98 110 108  ?  ?General:  No acute distress, nontoxic appearing  ?Respiratory: No increased work of breathing ?Neuro: Alert and oriented ?Psychiatric: Normal mood and affect  ?Skin: Wound over dorsum of foot has shrunk to 2.5 x 1.5 cm.  No surrounding skin changes.  Good granulation.  Wound over ankle is now 1.5 x 2 cm.  No cellulitic changes.  Healthy. ?MSK: Pedal pulse intact.  Ambulatory. ? ?Assessment/Plan ? ?Patient has had continued progression in wound healing involving her wound over dorsum of right foot.  It is at least 30% smaller than last encounter.  However, the wound over her lateral malleolus that had seemingly healed has returned after failing to wrap the area when going grocery shopping.  This is frustrating for the patient,  understandably. ? ?She continues to have home health wound care once every 2 weeks.  She is scheduled for reassessment on Monday.  She has been performing her dressing changes with collagen followed by Xeroform every other day. ? ?Recommend continued wound care with collagen and Xeroform.  Discussed referral to wound care center given chronicity of wound healing, but she would like to see where she is in another  4 weeks.  If there has been no significant improvement, she agrees to referral. ? ? ?Krista Blue ?06/01/2021, 1:22 PM  ? ? ?  ? ?

## 2021-06-07 ENCOUNTER — Telehealth: Payer: Self-pay | Admitting: *Deleted

## 2021-06-07 NOTE — Telephone Encounter (Signed)
Received on (05/08/21) via of fax Orders to be signed and returned.  Given to the provider to sign.   ? ?Orders signed and faxed back to [facility].  Confirmation received and copy scanned into the chart.//AB/CMA ?

## 2021-06-07 NOTE — Telephone Encounter (Signed)
Received on (05/09/21) via of fax Orders from [facility] requesting signature and return. Orders given to provider to sign.   ? ?Orders signed and faxed back to [facility].  Confirmation received and copy scanned into the chart.//AB/CMA ?

## 2021-06-14 ENCOUNTER — Telehealth: Payer: Self-pay | Admitting: *Deleted

## 2021-06-14 DIAGNOSIS — L03115 Cellulitis of right lower limb: Secondary | ICD-10-CM | POA: Diagnosis not present

## 2021-06-14 DIAGNOSIS — M2241 Chondromalacia patellae, right knee: Secondary | ICD-10-CM | POA: Diagnosis not present

## 2021-06-14 DIAGNOSIS — H8110 Benign paroxysmal vertigo, unspecified ear: Secondary | ICD-10-CM | POA: Diagnosis not present

## 2021-06-14 DIAGNOSIS — F32A Depression, unspecified: Secondary | ICD-10-CM | POA: Diagnosis not present

## 2021-06-14 DIAGNOSIS — I10 Essential (primary) hypertension: Secondary | ICD-10-CM | POA: Diagnosis not present

## 2021-06-14 DIAGNOSIS — K219 Gastro-esophageal reflux disease without esophagitis: Secondary | ICD-10-CM | POA: Diagnosis not present

## 2021-06-14 NOTE — Telephone Encounter (Signed)
Entered in error.//AB/CMA 

## 2021-06-14 NOTE — Telephone Encounter (Signed)
Received on (06/12/21) via of fax Physician Order from [facility] requesting signature and return.  Given to provider to sign and return. ? ?Physician Order signed and faxed back to [facility].  Confirmation received and copy scanned into the chart.//AB/CMA ?

## 2021-06-26 ENCOUNTER — Telehealth: Payer: Self-pay | Admitting: Family Medicine

## 2021-06-26 NOTE — Telephone Encounter (Signed)
Left message for patient to call back and schedule Medicare Annual Wellness Visit (AWV) to be completed by video or phone. ? ? ? ?Last AWV: 06/24/2018 ? ? ? ?Please schedule at anytime with  ?[facility] [place]    ? ? ? ?45 minute appointment ? ? ? ?Any questions, please contact me at [phone]  ?

## 2021-06-27 NOTE — Progress Notes (Signed)
Referring Provider Tonia Ghent, MD [place],  [place] [address]   CC:  Chief Complaint  Patient presents with   Follow-up      [name] Price is an 81 y.o. female.  HPI: Patient is a very pleasant 81 year old female with PMH of right foot cellulitis s/p debridement with placement of wound matrix and wound VAC 11/08/2020 who then had STSG 01/13/2021 with Dr. Claudia Desanctis who presents to clinic for postoperative follow-up.  She was last seen here in clinic on 06/01/2021.  At that time, wound over dorsum of foot has shrunk to 2.5 x 1.5 cm.  Wound over ankle 1.5 x 2 cm.  Healthy appearing.  Continue to have home health wound care once per week.  Her ankle wound had previously healed, but returned after failing to wrap the area when going grocery shopping.  At conclusion of visit, recommending continued wound care with collagen and Xeroform dressings versus referral to wound care center given chronicity of healing.  She agreed that if there was no significant improvement after next visit that she would try the wound care center.  Today, patient states that she feels as well her wounds are improving.  She has been changing her dressings every other day, as discussed.  Denies any worsening pain or swelling.  Denies any worsening wounds.  Denies any concerning drainage.  She is looking forward to visiting her [place] the beach next month.   Allergies  Allergen Reactions   Codeine Nausea And Vomiting   Fosamax [Alendronate]     GI intolerance and "didn't feel well on medicine."      Outpatient Encounter Medications as of 06/30/2021  Medication Sig   acetaminophen (TYLENOL) 500 MG tablet Take 500 mg by mouth daily as needed (pain).   B Complex Vitamins (VITAMIN B COMPLEX) TABS Take 1 tablet by mouth every morning.   Calcium Carbonate-Vitamin D (CALCIUM-D PO) Take 1 tablet by mouth 2 (two) times daily.   cetirizine (ZYRTEC) 10 MG chewable tablet Chew 10 mg by mouth daily.    Cholecalciferol (VITAMIN D-3 PO) Take 1 tablet by mouth every morning.   PARoxetine (PAXIL-CR) 25 MG 24 hr tablet Take 1 tablet (25 mg total) by mouth every morning.   No facility-administered encounter medications on file as of 06/30/2021.     Past Medical History:  Diagnosis Date   Allergy    Anxiety    Arthritis    Blood transfusion without reported diagnosis    as baby   Breast cancer ([place]) 1999-2000   s/p lumpectomy and radiation, no chemo-right   History of chicken pox    History of hip replacement    Hot flashes    treated wtih paxil   Hyperlipidemia    Osteoporosis    DXA 11/12/17   UTI (urinary tract infection)     Past Surgical History:  Procedure Laterality Date   APPLICATION OF WOUND VAC Right 11/08/2020   Procedure: APPLICATION OF WOUND VAC;  Surgeon: [name], MD;  Location: [facility];  Service: Plastics;  Laterality: Right;   BREAST SURGERY Right    CATARACT EXTRACTION W/PHACO Left 12/28/2014   Procedure: CATARACT EXTRACTION PHACO AND INTRAOCULAR LENS PLACEMENT ([place]);  Surgeon: Birder Robson, MD;  Location: [facility];  Service: Ophthalmology;  Laterality: Left;  [place] 00:38 AP% 19.9 CDE 7.70 fluid pack lot # [id number]   CATARACT EXTRACTION W/PHACO Right 07/10/2016   Procedure: CATARACT EXTRACTION PHACO AND INTRAOCULAR LENS PLACEMENT ([facility]);  Surgeon: Birder Robson, MD;  Location: [facility];  Service: Ophthalmology;  Laterality: Right;  [place] 00:38 AP% 18.3 CDE 7.06 Fluid pack lot # [id number]   COLONOSCOPY  2016   DEBRIDEMENT AND CLOSURE WOUND Right 11/01/2020   Procedure: DEBRIDEMENT RIGHT FOOT WOUND;  Surgeon: [name], MD;  [facility]: [facility];  Service: Plastics;  Laterality: Right;  60 minutes   DEBRIDEMENT AND CLOSURE WOUND Right 11/08/2020   Procedure: Debridement of r. foot wound;  Surgeon: [name], MD;  Location: [facility];  Service: Plastics;  Laterality: Right;   EYE SURGERY     JOINT REPLACEMENT Bilateral    hip  / shoulder   OOPHORECTOMY  Left    POLYPECTOMY     SKIN SPLIT GRAFT Right 01/13/2021   Procedure: SKIN GRAFT SPLIT THICKNESS;  Surgeon: [name], MD;  Location: [place];  Service: Plastics;  Laterality: Right;   TOTAL HIP ARTHROPLASTY Bilateral    (2) Two   TOTAL SHOULDER REPLACEMENT      Family History  Problem Relation Age of Onset   Lung cancer Mother    Heart disease Mother    Colon cancer Mother        late 29's   Cancer Father    Colon cancer Father 61       died at 58   Breast cancer Maternal Aunt    Breast cancer Maternal Aunt    Breast cancer Maternal Aunt    Rectal cancer Neg Hx    Stomach cancer Neg Hx    Crohn's disease Neg Hx    Esophageal cancer Neg Hx     Social History   Social History Narrative   Married 1963   Likes to travel   2 daughters     Review of Systems General: Denies fevers or chills Skin: Endorses wound healing, denies concerning drainage or swelling MSK: Denies difficulty walking  Physical Exam    04/24/2021   11:19 AM 04/24/2021   11:10 AM 01/13/2021    4:07 PM  Vitals with BMI  Height  '5\' 3"'$    Weight  148 lbs   BMI  84.16   Systolic  606 301  Diastolic  76 76  Pulse 98 110 108    General:  No acute distress, nontoxic appearing  Respiratory: No increased work of breathing Neuro: Alert and oriented Psychiatric: Normal mood and affect  MSK: Ambulatory, pedal pulse intact Skin: Advancing epithelialization of wound over lateral malleolus, now measuring 2 x 0.75 cm.  Wound over dorsum of foot distally has healed, scabbed over.  No surrounding cellulitic changes.  Assessment/Plan  Patient's wounds have improved nicely.  No cellulitic changes.  Patient is doing an excellent job at home.  Discussed good diet and regular walking for improved blood flow and continued wound healing.  She is wearing footwear that will not rub against wounds.  Continued collagen followed by Xeroform and gauze secured with Kerlix, changed every other day.   She will continue to keep the area moist, as needed.  The scabbing distally should slough off, suspect there is good wound healing underneath.  As for the wound over lateral malleolus, almost healed over.  Follow-up in 6 weeks, sooner if needed.  Patient understands that if it heals in the interim, she can call to cancel the appointment.  She is pleased with the progress.  Picture(s) obtained of the patient and placed in the chart were with the patient's or guardian's permission.  Krista Blue 06/30/2021, 12:52 PM

## 2021-06-30 ENCOUNTER — Ambulatory Visit (INDEPENDENT_AMBULATORY_CARE_PROVIDER_SITE_OTHER): Payer: Medicare Other | Admitting: Physician Assistant

## 2021-06-30 VITALS — BP 98/74 | HR 85 | Temp 98.1°F | Resp 18

## 2021-06-30 DIAGNOSIS — S91301D Unspecified open wound, right foot, subsequent encounter: Secondary | ICD-10-CM

## 2021-06-30 DIAGNOSIS — Z719 Counseling, unspecified: Secondary | ICD-10-CM

## 2021-07-25 ENCOUNTER — Other Ambulatory Visit: Payer: Medicare Other

## 2021-08-02 ENCOUNTER — Telehealth: Payer: Self-pay | Admitting: *Deleted

## 2021-08-02 NOTE — Telephone Encounter (Signed)
Received on (04/18/21) via fax Orders from [facility] requesting signature,date and return.  Given to provider to sign and return.     Orders signed,dated, and faxed to [facility].  Confirmation received and copy scanned into the chart.//AB/CMA

## 2021-08-02 NOTE — Telephone Encounter (Signed)
Received on (04/18/21) via of fax Client Coordination Note Report from [facility].  Requesting signature,date, and return.  Given to provider to sign,date, and return.     Client Coordination Note Report signed,dated,and faxed to [facility].  Confirmation received and copy scanned into the chart.//AB/CMA

## 2021-08-08 NOTE — Progress Notes (Signed)
Referring Provider Tonia Ghent, MD [place],  [place] [address]   CC:  Chief Complaint  Patient presents with   Skin Problem      [name] Price is an 81 y.o. female.  HPI: Patient is a very pleasant 81 year old female with PMH of right foot cellulitis s/p debridement with placement of wound matrix and wound VAC 11/08/2020 who then had STSG 01/13/2021 with Dr. Claudia Desanctis who presents to clinic for postoperative follow-up.  She was last seen here in clinic on 06/30/2021.  At that time, she felt as though her wounds were healing nicely.  Denies any worsening pain or swelling and was looking forward to visiting her sister at the beach the following month.  On exam, 2 x 0.5 cm wound was noted over lateral malleolus.  Wound over dorsum of foot distally had healed and scabbed over.  Plan is for continued collagen followed by Xeroform and gauze secured with Kerlix changed every other day.  Emphasized importance of moisture control.  Follow-up in 6 weeks, sooner if needed.  She was informed that she can cancel the appointment if it heals in interim.  Today, patient is doing well.  She denies any pain symptoms.  States that she has resumed normal activities such as walking her dog without restriction.  She has 1 small scab over her lateral malleolus, otherwise no wounds or other issues.  She is quite pleased with her progress.  She went to the beach and still plans to go to [place] in the fall followed by a trip to [place].  She has been applying CeraVe over her foot and ankle.  Allergies  Allergen Reactions   Codeine Nausea And Vomiting   Fosamax [Alendronate]     GI intolerance and "didn't feel well on medicine."      Outpatient Encounter Medications as of 08/11/2021  Medication Sig   acetaminophen (TYLENOL) 500 MG tablet Take 500 mg by mouth daily as needed (pain).   B Complex Vitamins (VITAMIN B COMPLEX) TABS Take 1 tablet by mouth every morning.   Calcium Carbonate-Vitamin  D (CALCIUM-D PO) Take 1 tablet by mouth 2 (two) times daily.   cetirizine (ZYRTEC) 10 MG chewable tablet Chew 10 mg by mouth daily.   Cholecalciferol (VITAMIN D-3 PO) Take 1 tablet by mouth every morning.   PARoxetine (PAXIL-CR) 25 MG 24 hr tablet Take 1 tablet (25 mg total) by mouth every morning.   No facility-administered encounter medications on file as of 08/11/2021.     Past Medical History:  Diagnosis Date   Allergy    Anxiety    Arthritis    Blood transfusion without reported diagnosis    as baby   Breast cancer ([place]) 1999-2000   s/p lumpectomy and radiation, no chemo-right   History of chicken pox    History of hip replacement    Hot flashes    treated wtih paxil   Hyperlipidemia    Osteoporosis    DXA 11/12/17   UTI (urinary tract infection)     Past Surgical History:  Procedure Laterality Date   APPLICATION OF WOUND VAC Right 11/08/2020   Procedure: APPLICATION OF WOUND VAC;  Surgeon: [name], MD;  [facility];  Service: Plastics;  Laterality: Right;   BREAST SURGERY Right    CATARACT EXTRACTION W/PHACO Left 12/28/2014   Procedure: CATARACT EXTRACTION PHACO AND INTRAOCULAR LENS PLACEMENT ([place]);  Surgeon: Birder Robson, MD;  Location: [facility];  Service: Ophthalmology;  Laterality: Left;  [place] 00:38 AP% 19.9 CDE 7.70 fluid pack lot # [id number]   CATARACT EXTRACTION W/PHACO Right 07/10/2016   Procedure: CATARACT EXTRACTION PHACO AND INTRAOCULAR LENS PLACEMENT ([facility]);  Surgeon: Birder Robson, MD;  Location: [facility];  Service: Ophthalmology;  Laterality: Right;  [place] 00:38 AP% 18.3 CDE 7.06 Fluid pack lot # [id number] H   COLONOSCOPY  2016   DEBRIDEMENT AND CLOSURE WOUND Right 11/01/2020   Procedure: DEBRIDEMENT RIGHT FOOT WOUND;  Surgeon: [name], MD;  [facility];  Service: Plastics;  Laterality: Right;  60 minutes   DEBRIDEMENT AND CLOSURE WOUND Right 11/08/2020   Procedure: Debridement of r. foot wound;  Surgeon: [name], MD;   [facility];  Service: Plastics;  Laterality: Right;   EYE SURGERY     JOINT REPLACEMENT Bilateral    hip  / shoulder   OOPHORECTOMY Left    POLYPECTOMY     SKIN SPLIT GRAFT Right 01/13/2021   Procedure: SKIN GRAFT SPLIT THICKNESS;  Surgeon: [name], MD;  Location: [place];  Service: Plastics;  Laterality: Right;   TOTAL HIP ARTHROPLASTY Bilateral    (2) Two   TOTAL SHOULDER REPLACEMENT      Family History  Problem Relation Age of Onset   Lung cancer Mother    Heart disease Mother    Colon cancer Mother        late 52's   Cancer Father    Colon cancer Father 65       died at 47   Breast cancer Maternal Aunt    Breast cancer Maternal Aunt    Breast cancer Maternal Aunt    Rectal cancer Neg Hx    Stomach cancer Neg Hx    Crohn's disease Neg Hx    Esophageal cancer Neg Hx     Social History   Social History Narrative   Married 1963   Likes to travel   2 daughters     Review of Systems General: Denies fevers or chills Skin: Denies drainage or worsening wound  Physical Exam    06/30/2021    1:58 PM 04/24/2021   11:19 AM 04/24/2021   11:10 AM  Vitals with BMI  Height   '5\' 3"'$   Weight   148 lbs  BMI   01.77  Systolic 98  939  Diastolic 74  76  Pulse 85 98 110    General:  No acute distress, nontoxic appearing  Respiratory: No increased work of breathing Neuro: Alert and oriented Psychiatric: Normal mood and affect  Skin: Graft is well-healed.  No wounds.  No cellulitic changes.  Mild scabbing over lateral malleolus, no drainage.  Assessment/Plan  She appears to be well-healed from her STSG 01/2021.  No evidence of infection on exam.  Mildly dry appearing, recommending moisturizing agents.  We will also recommend scar mitigation creams, she can start whenever.  No specific restrictions at this time.  She is pleased with her progress.  No specific follow-up needed, but she is certainly able to call the clinic should she have any  additional questions or concerns in future.  Picture(s) obtained of the patient and placed in the chart were with the patient's or guardian's permission.   Krista Blue 08/11/2021, 12:55 PM

## 2021-08-11 ENCOUNTER — Ambulatory Visit (INDEPENDENT_AMBULATORY_CARE_PROVIDER_SITE_OTHER): Payer: Medicare Other | Admitting: Physician Assistant

## 2021-08-11 DIAGNOSIS — S91301D Unspecified open wound, right foot, subsequent encounter: Secondary | ICD-10-CM

## 2021-08-11 DIAGNOSIS — Z09 Encounter for follow-up examination after completed treatment for conditions other than malignant neoplasm: Secondary | ICD-10-CM | POA: Diagnosis not present

## 2021-08-16 LAB — MISCELLANEOUS TEST

## 2021-09-06 ENCOUNTER — Ambulatory Visit (INDEPENDENT_AMBULATORY_CARE_PROVIDER_SITE_OTHER): Payer: Medicare Other

## 2021-09-06 VITALS — Ht 63.0 in | Wt 145.0 lb

## 2021-09-06 DIAGNOSIS — Z0000 Encounter for general adult medical examination without abnormal findings: Secondary | ICD-10-CM | POA: Diagnosis not present

## 2021-09-06 NOTE — Patient Instructions (Signed)
[name] Price , Thank you for taking time to come for your Medicare Wellness Visit. I appreciate your ongoing commitment to your health goals. Please review the following plan we discussed and let me know if I can assist you in the future.   Screening recommendations/referrals: Colonoscopy: completed 07/10/2017, due 07/11/2022 Mammogram: completed 2023 per patient Bone Density: completed 11/11/2017 Recommended yearly ophthalmology/optometry visit for glaucoma screening and checkup Recommended yearly dental visit for hygiene and checkup  Vaccinations: Influenza vaccine: due 09/12/2021 Pneumococcal vaccine: completed 12/04/2016 Tdap vaccine: completed 03/14/2015, due 03/13/2025 Shingles vaccine: discussed   Covid-19: 03/28/2019, 03/07/2019  Advanced directives: Please bring a copy of your POA (Power of Attorney) and/or Living Will to your next appointment.   Conditions/risks identified: none  Next appointment: Follow up in one year for your annual wellness visit    Preventive Care 79 Years and Older, Female Preventive care refers to lifestyle choices and visits with your health care provider that can promote health and wellness. What does preventive care include? A yearly physical exam. This is also called an annual well check. Dental exams once or twice a year. Routine eye exams. Ask your health care provider how often you should have your eyes checked. Personal lifestyle choices, including: Daily care of your teeth and gums. Regular physical activity. Eating a healthy diet. Avoiding tobacco and drug use. Limiting alcohol use. Practicing safe sex. Taking low-dose aspirin every day. Taking vitamin and mineral supplements as recommended by your health care provider. What happens during an annual well check? The services and screenings done by your health care provider during your annual well check will depend on your age, overall health, lifestyle risk factors, and family history of  disease. Counseling  Your health care provider may ask you questions about your: Alcohol use. Tobacco use. Drug use. Emotional well-being. Home and relationship well-being. Sexual activity. Eating habits. History of falls. Memory and ability to understand (cognition). Work and work Statistician. Reproductive health. Screening  You may have the following tests or measurements: Height, weight, and BMI. Blood pressure. Lipid and cholesterol levels. These may be checked every 5 years, or more frequently if you are over 59 years old. Skin check. Lung cancer screening. You may have this screening every year starting at age 89 if you have a 30-pack-year history of smoking and currently smoke or have quit within the past 15 years. Fecal occult blood test (FOBT) of the stool. You may have this test every year starting at age 40. Flexible sigmoidoscopy or colonoscopy. You may have a sigmoidoscopy every 5 years or a colonoscopy every 10 years starting at age 23. Hepatitis C blood test. Hepatitis B blood test. Sexually transmitted disease (STD) testing. Diabetes screening. This is done by checking your blood sugar (glucose) after you have not eaten for a while (fasting). You may have this done every 1-3 years. Bone density scan. This is done to screen for osteoporosis. You may have this done starting at age 54. Mammogram. This may be done every 1-2 years. Talk to your health care provider about how often you should have regular mammograms. Talk with your health care provider about your test results, treatment options, and if necessary, the need for more tests. Vaccines  Your health care provider may recommend certain vaccines, such as: Influenza vaccine. This is recommended every year. Tetanus, diphtheria, and acellular pertussis (Tdap, Td) vaccine. You may need a Td booster every 10 years. Zoster vaccine. You may need this after age 68. Pneumococcal 13-valent conjugate (PCV13)  vaccine. One  dose is recommended after age 34. Pneumococcal polysaccharide (PPSV23) vaccine. One dose is recommended after age 43. Talk to your health care provider about which screenings and vaccines you need and how often you need them. This information is not intended to replace advice given to you by your health care provider. Make sure you discuss any questions you have with your health care provider. Document Released: 02/25/2015 Document Revised: 10/19/2015 Document Reviewed: 11/30/2014 Elsevier Interactive Patient Education  2017 [place] Prevention in the Home Falls can cause injuries. They can happen to people of all ages. There are many things you can do to make your home safe and to help prevent falls. What can I do on the outside of my home? Regularly fix the edges of walkways and driveways and fix any cracks. Remove anything that might make you trip as you walk through a door, such as a raised step or threshold. Trim any bushes or trees on the path to your home. Use bright outdoor lighting. Clear any walking paths of anything that might make someone trip, such as rocks or tools. Regularly check to see if handrails are loose or broken. Make sure that both sides of any steps have handrails. Any raised decks and porches should have guardrails on the edges. Have any leaves, snow, or ice cleared regularly. Use sand or salt on walking paths during winter. Clean up any spills in your garage right away. This includes oil or grease spills. What can I do in the bathroom? Use night lights. Install grab bars by the toilet and in the tub and shower. Do not use towel bars as grab bars. Use non-skid mats or decals in the tub or shower. If you need to sit down in the shower, use a plastic, non-slip stool. Keep the floor dry. Clean up any water that spills on the floor as soon as it happens. Remove soap buildup in the tub or shower regularly. Attach bath mats securely with double-sided  non-slip rug tape. Do not have throw rugs and other things on the floor that can make you trip. What can I do in the bedroom? Use night lights. Make sure that you have a light by your bed that is easy to reach. Do not use any sheets or blankets that are too big for your bed. They should not hang down onto the floor. Have a firm chair that has side arms. You can use this for support while you get dressed. Do not have throw rugs and other things on the floor that can make you trip. What can I do in the kitchen? Clean up any spills right away. Avoid walking on wet floors. Keep items that you use a lot in easy-to-reach places. If you need to reach something above you, use a strong step stool that has a grab bar. Keep electrical cords out of the way. Do not use floor polish or wax that makes floors slippery. If you must use wax, use non-skid floor wax. Do not have throw rugs and other things on the floor that can make you trip. What can I do with my stairs? Do not leave any items on the stairs. Make sure that there are handrails on both sides of the stairs and use them. Fix handrails that are broken or loose. Make sure that handrails are as long as the stairways. Check any carpeting to make sure that it is firmly attached to the stairs. Fix any carpet that is loose  or worn. Avoid having throw rugs at the top or bottom of the stairs. If you do have throw rugs, attach them to the floor with carpet tape. Make sure that you have a light switch at the top of the stairs and the bottom of the stairs. If you do not have them, ask someone to add them for you. What else can I do to help prevent falls? Wear shoes that: Do not have high heels. Have rubber bottoms. Are comfortable and fit you well. Are closed at the toe. Do not wear sandals. If you use a stepladder: Make sure that it is fully opened. Do not climb a closed stepladder. Make sure that both sides of the stepladder are locked into place. Ask  someone to hold it for you, if possible. Clearly mark and make sure that you can see: Any grab bars or handrails. First and last steps. Where the edge of each step is. Use tools that help you move around (mobility aids) if they are needed. These include: Canes. Walkers. Scooters. Crutches. Turn on the lights when you go into a dark area. Replace any light bulbs as soon as they burn out. Set up your furniture so you have a clear path. Avoid moving your furniture around. If any of your floors are uneven, fix them. If there are any pets around you, be aware of where they are. Review your medicines with your doctor. Some medicines can make you feel dizzy. This can increase your chance of falling. Ask your doctor what other things that you can do to help prevent falls. This information is not intended to replace advice given to you by your health care provider. Make sure you discuss any questions you have with your health care provider. Document Released: 11/25/2008 Document Revised: 07/07/2015 Document Reviewed: 03/05/2014 Elsevier Interactive Patient Education  2017 [employer].

## 2021-09-06 NOTE — Progress Notes (Signed)
I connected with [name] Price today by telephone and verified that I am speaking with the correct person using two identifiers. Location patient: home Location provider: work Persons participating in the virtual visit: Cassadi, Purdie LPN.   I discussed the limitations, risks, security and privacy concerns of performing an evaluation and management service by telephone and the availability of in person appointments. I also discussed with the patient that there may be a patient responsible charge related to this service. The patient expressed understanding and verbally consented to this telephonic visit.    Interactive audio and video telecommunications were attempted between this provider and patient, however failed, due to patient having technical difficulties OR patient did not have access to video capability.  We continued and completed visit with audio only.     Vital signs may be patient reported or missing.  Subjective:   [name] Price is a 81 y.o. female who presents for Medicare Annual (Subsequent) preventive examination.  Review of Systems     Cardiac Risk Factors include: advanced age (>48mn, >>65women);hypertension     Objective:    Today's Vitals   09/06/21 1226  Weight: 145 lb (65.8 kg)  Height: '5\' 3"'$  (1.6 m)   Body mass index is 25.69 kg/m.     09/06/2021   12:29 PM 01/13/2021    9:57 AM 01/03/2021    3:13 PM 11/28/2020    2:17 PM 11/08/2020    2:01 PM 11/03/2020    2:00 AM 10/29/2020    1:18 PM  Advanced Directives  Does Patient Have a Medical Advance Directive? Yes Yes Yes Yes No No Yes  Type of AParamed[place]of [place] Healthcare Power of [place]    Does patient want to make changes to medical advance directive?  No - Patient declined No - Patient declined No - Patient declined No - Patient declined    Copy of [place]? No - copy requested No  - copy requested       Would patient like information on creating a medical advance directive?  No - Patient declined No - Patient declined  No - Patient declined No - Patient declined     Current Medications (verified) Outpatient Encounter Medications as of 09/06/2021  Medication Sig   acetaminophen (TYLENOL) 500 MG tablet Take 500 mg by mouth daily as needed (pain).   B Complex Vitamins (VITAMIN B COMPLEX) TABS Take 1 tablet by mouth every morning.   Calcium Carbonate-Vitamin D (CALCIUM-D PO) Take 1 tablet by mouth 2 (two) times daily.   cetirizine (ZYRTEC) 10 MG chewable tablet Chew 10 mg by mouth daily.   Cholecalciferol (VITAMIN D-3 PO) Take 1 tablet by mouth every morning.   PARoxetine (PAXIL-CR) 25 MG 24 hr tablet Take 1 tablet (25 mg total) by mouth every morning.   No facility-administered encounter medications on file as of 09/06/2021.    Allergies (verified) Codeine and Fosamax [alendronate]   History: Past Medical History:  Diagnosis Date   Allergy    Anxiety    Arthritis    Blood transfusion without reported diagnosis    as baby   [place]   s/p lumpectomy and radiation, no chemo-right   History of chicken pox    History of hip replacement    Hot flashes    treated wtih paxil   Hyperlipidemia    Osteoporosis    DXA 11/12/17  UTI (urinary tract infection)    Past Surgical History:  Procedure Laterality Date   APPLICATION OF WOUND VAC Right 11/08/2020   Procedure: APPLICATION OF WOUND VAC;  Surgeon: [name], MD;  Location: [facility];  Service: Plastics;  Laterality: Right;   BREAST SURGERY Right    CATARACT EXTRACTION W/PHACO Left 12/28/2014   Procedure: CATARACT EXTRACTION PHACO AND INTRAOCULAR LENS PLACEMENT ([place]);  Surgeon: Birder Robson, MD;  Location: [facility];  Service: Ophthalmology;  Laterality: Left;  [place] 00:38 AP% 19.9 CDE 7.70 fluid pack lot # [id number]   CATARACT EXTRACTION W/PHACO Right 07/10/2016   Procedure: CATARACT  EXTRACTION PHACO AND INTRAOCULAR LENS PLACEMENT ([facility]);  Surgeon: Birder Robson, MD;  Location: [facility];  Service: Ophthalmology;  Laterality: Right;  [place] 00:38 AP% 18.3 CDE 7.06 Fluid pack lot # [id number]   COLONOSCOPY  2016   DEBRIDEMENT AND CLOSURE WOUND Right 11/01/2020   Procedure: DEBRIDEMENT RIGHT FOOT WOUND;  Surgeon: [name], MD;  Location: [facility];  Service: Plastics;  Laterality: Right;  60 minutes   DEBRIDEMENT AND CLOSURE WOUND Right 11/08/2020   Procedure: Debridement of r. foot wound;  Surgeon: [name], MD;  Location: [facility];  Service: Plastics;  Laterality: Right;   EYE SURGERY     JOINT REPLACEMENT Bilateral    hip  / shoulder   OOPHORECTOMY Left    POLYPECTOMY     SKIN SPLIT GRAFT Right 01/13/2021   Procedure: SKIN GRAFT SPLIT THICKNESS;  Surgeon: [name], MD;  Location: [facility];  Service: Plastics;  Laterality: Right;   TOTAL HIP ARTHROPLASTY Bilateral    (2) Two   TOTAL SHOULDER REPLACEMENT     Family History  Problem Relation Age of Onset   Lung cancer Mother    Heart disease Mother    Colon cancer Mother        late 18's   Cancer Father    Colon cancer Father 40       died at 70   Breast cancer Maternal Aunt    Breast cancer Maternal Aunt    Breast cancer Maternal Aunt    Rectal cancer Neg Hx    Stomach cancer Neg Hx    Crohn's disease Neg Hx    Esophageal cancer Neg Hx    Social History   Socioeconomic History   Marital status: Married    Spouse name: Not on file   Number of children: Not on file   Years of education: Not on file   Highest education level: Not on file  Occupational History   Not on file  Tobacco Use   Smoking status: Never   Smokeless tobacco: Never  Vaping Use   Vaping Use: Never used  Substance and Sexual Activity   Alcohol use: No    Comment: rare once a year   Drug use: No   Sexual activity: Not Currently  Other Topics Concern   Not on file  Social History Narrative    Married 1963   Likes to travel   2 daughters   Social Determinants of Health   Financial Resource Strain: Low Risk  (09/06/2021)   Overall Financial Resource Strain (CARDIA)    Difficulty of Paying Living Expenses: Not hard at all  Food Insecurity: No Food Insecurity (09/06/2021)   Hunger Vital Sign    Worried About Running Out of Food in the Last Year: Never true    [place] in the Last Year: Never  true  Transportation Needs: No Transportation Needs (09/06/2021)   PRAPARE - Hydrologist (Medical): No    Lack of Transportation (Non-Medical): No  Physical Activity: Insufficiently Active (09/06/2021)   Exercise Vital Sign    Days of Exercise per Week: 7 days    Minutes of Exercise per Session: 20 min  Stress: No Stress Concern Present (09/06/2021)   [employer]    Feeling of Stress : Not at all  Social Connections: Not on file    Tobacco Counseling Counseling given: Not Answered   Clinical Intake:  Pre-visit preparation completed: Yes  Pain : No/denies pain     Nutritional Status: BMI 25 -29 Overweight Nutritional Risks: None Diabetes: No  How often do you need to have someone help you when you read instructions, pamphlets, or other written materials from your doctor or pharmacy?: 1 - Never What is the last grade level you completed in school?: 12th grade  Diabetic? no  Interpreter Needed?: No  Information entered by :: NAllen LPN   Activities of Daily Living    09/06/2021   12:30 PM 01/13/2021   10:03 AM  In your present state of health, do you have any difficulty performing the following activities:  Hearing? 0 0  Vision? 0 0  Difficulty concentrating or making decisions? 0 0  Walking or climbing stairs? 0 0  Dressing or bathing? 0 0  Doing errands, shopping? 0   Preparing Food and eating ? N   Using the Toilet? N   In the past six months, have you accidently  leaked urine? N   Do you have problems with loss of bowel control? N   Managing your Medications? N   Managing your Finances? N   Housekeeping or managing your Housekeeping? N     Patient Care Team: Tonia Ghent, MD as PCP - General (Family Medicine)  Indicate any recent Medical Services you may have received from other than [facility] providers in the past year (date may be approximate).     Assessment:   This is a routine wellness examination for [place].  Hearing/Vision screen Vision Screening - Comments:: Regular eye exams, [facility]  Dietary issues and exercise activities discussed: Current Exercise Habits: Home exercise routine, Type of exercise: walking, Time (Minutes): 20, Frequency (Times/Week): 7, Weekly Exercise (Minutes/Week): 140   Goals Addressed             This Visit's Progress    Patient Stated       09/06/2021, wants to keep weight down       Depression Screen    09/06/2021   12:30 PM 11/28/2020    2:23 PM 06/24/2018    9:03 AM 06/17/2017   11:26 AM 06/15/2016   11:29 AM 05/30/2015   11:33 AM  PHQ 2/9 Scores  PHQ - 2 Score 0 0 0 0 0 0  PHQ- 9 Score   0 0      Fall Risk    09/06/2021   12:30 PM 11/28/2020    2:23 PM 06/24/2018    9:03 AM 06/17/2017   11:26 AM 06/15/2016   11:29 AM  Fall Risk   Falls in the past year? 0 0 1 No No  Comment   2 falls related to malfunctioning prosthesis in left hip; denies injury    Number falls in past yr: 0 0 1    Injury with Fall? 0  0  Risk for fall due to : Medication side effect  History of fall(s);Impaired balance/gait    Follow up Falls evaluation completed;Education provided;Falls prevention discussed        FALL RISK PREVENTION PERTAINING TO THE HOME:  Any stairs in or around the home? Yes  If so, are there any without handrails? No  Home free of loose throw rugs in walkways, pet beds, electrical cords, etc? Yes  Adequate lighting in your home to reduce risk of falls? Yes   ASSISTIVE DEVICES  UTILIZED TO PREVENT FALLS:  Life alert? No  Use of a cane, walker or w/c? No  Grab bars in the bathroom? Yes  Shower chair or bench in shower? Yes  Elevated toilet seat or a handicapped toilet? Yes   TIMED UP AND GO:  Was the test performed? No .  .     Cognitive Function:    06/24/2018    9:41 AM 06/17/2017   11:27 AM 06/15/2016   11:41 AM 05/30/2015   11:33 AM  MMSE - Mini Mental State Exam  Orientation to time '5 5 5 5  '$ Orientation to Place '5 5 5 5  '$ Registration '3 3 3 3  '$ Attention/ Calculation 0 0 0 0  Recall '3 3 3 2  '$ Recall-comments    recalled 2 of 3 words without cue  Language- name 2 objects 0 0 0 0  Language- repeat '1 1 1 1  '$ Language- follow 3 step command 0 '3 3 3  '$ Language- read & follow direction 0 0 0 0  Write a sentence 0 0 0 0  Copy design 0 0 0 0  Total score '17 20 20 19        '$ 09/06/2021   12:31 PM  6CIT Screen  What Year? 0 points  What month? 0 points  What time? 0 points  Count back from 20 0 points  Months in reverse 0 points  Repeat phrase 0 points  Total Score 0 points    Immunizations Immunization History  Administered Date(s) Administered   Fluad Quad(high Dose 65+) 10/27/2018, 11/07/2020   Influenza Split 11/10/2010   Influenza Whole 11/13/2007   Influenza, High Dose Seasonal PF 12/27/2015, 11/08/2016, 11/12/2017   Influenza-Unspecified 11/12/2013, 11/24/2019   PFIZER(Purple Top)SARS-COV-2 Vaccination 03/07/2019, 03/28/2019   Pneumococcal Conjugate-13 05/30/2015, 12/04/2016   Pneumococcal Polysaccharide-23 03/03/2013   Td 02/13/1991, 03/14/2015    TDAP status: Up to date  Flu Vaccine status: Up to date  Pneumococcal vaccine status: Up to date  Covid-19 vaccine status: Completed vaccines  Qualifies for Shingles Vaccine? Yes   Zostavax completed Yes   Shingrix Completed?: No.    Education has been provided regarding the importance of this vaccine. Patient has been advised to call insurance company to determine out of pocket  expense if they have not yet received this vaccine. Advised may also receive vaccine at local pharmacy or Health Dept. Verbalized acceptance and understanding.  Screening Tests Health Maintenance  Topic Date Due   Hepatitis C Screening  Never done   Zoster Vaccines- Shingrix (1 of 2) Never done   COVID-19 Vaccine (3 - Pfizer series) 05/23/2019   MAMMOGRAM  06/20/2021   INFLUENZA VACCINE  09/12/2021   COLONOSCOPY (Pts 45-70yr Insurance coverage will need to be confirmed)  07/11/2022   TETANUS/TDAP  03/13/2025   Pneumonia Vaccine 79 Years old  Completed   DEXA SCAN  Completed   HPV VACCINES  Aged Out    Health Maintenance  Health Maintenance Due  Topic  Date Due   Hepatitis C Screening  Never done   Zoster Vaccines- Shingrix (1 of 2) Never done   COVID-19 Vaccine (3 - Pfizer series) 05/23/2019   MAMMOGRAM  06/20/2021    Colorectal cancer screening: Type of screening: Colonoscopy. Completed 07/10/2017. Repeat every 5 years  Mammogram status: Completed 2023. Repeat every year  Bone Density status: Completed 11/11/2017.   Lung Cancer Screening: (Low Dose CT Chest recommended if Age 47-80 years, 30 pack-year currently smoking OR have quit w/in 15years.) does not qualify.   Lung Cancer Screening Referral: no  Additional Screening:  Hepatitis C Screening: does qualify;   Vision Screening: Recommended annual ophthalmology exams for early detection of glaucoma and other disorders of the eye. Is the patient up to date with their annual eye exam?  Yes  Who is the provider or what is the name of the office in which the patient attends annual eye exams? [facility] If pt is not established with a provider, would they like to be referred to a provider to establish care? No .   Dental Screening: Recommended annual dental exams for proper oral hygiene  Community Resource Referral / Chronic Care Management: CRR required this visit?  No   CCM required this visit?  No       Plan:     I have personally reviewed and noted the following in the patient's chart:   Medical and social history Use of alcohol, tobacco or illicit drugs  Current medications and supplements including opioid prescriptions.  Functional ability and status Nutritional status Physical activity Advanced directives List of other physicians Hospitalizations, surgeries, and ER visits in previous 12 months Vitals Screenings to include cognitive, depression, and falls Referrals and appointments  In addition, I have reviewed and discussed with patient certain preventive protocols, quality metrics, and best practice recommendations. A written personalized care plan for preventive services as well as general preventive health recommendations were provided to patient.     Kellie Simmering, LPN   2/54/2706   Nurse Notes: none  Due to this being a virtual visit, the after visit summary with patients personalized plan was offered to patient via mail or my-chart. Patient would like to access on my-chart

## 2021-11-01 ENCOUNTER — Encounter: Payer: Self-pay | Admitting: Family Medicine

## 2021-11-02 MED ORDER — TIZANIDINE HCL 2 MG PO TABS: 2.0000 mg | ORAL_TABLET | Freq: Two times a day (BID) | ORAL | 0 refills | Status: DC | PRN

## 2021-11-02 NOTE — Telephone Encounter (Signed)
Please request a copy of her ortho notes.  Thanks.

## 2021-11-09 ENCOUNTER — Other Ambulatory Visit: Payer: Self-pay | Admitting: Family Medicine

## 2021-12-10 ENCOUNTER — Other Ambulatory Visit: Payer: Self-pay | Admitting: Family Medicine

## 2021-12-10 DIAGNOSIS — Z23 Encounter for immunization: Secondary | ICD-10-CM | POA: Diagnosis not present

## 2021-12-19 ENCOUNTER — Other Ambulatory Visit: Payer: Self-pay | Admitting: Family Medicine

## 2022-02-20 ENCOUNTER — Encounter: Payer: Self-pay | Admitting: Family Medicine

## 2022-02-20 ENCOUNTER — Ambulatory Visit (INDEPENDENT_AMBULATORY_CARE_PROVIDER_SITE_OTHER): Payer: Medicare Other | Admitting: Family Medicine

## 2022-02-20 VITALS — BP 104/80 | HR 84 | Temp 97.5°F | Ht 63.0 in | Wt 146.0 lb

## 2022-02-20 DIAGNOSIS — R21 Rash and other nonspecific skin eruption: Secondary | ICD-10-CM | POA: Diagnosis not present

## 2022-02-20 DIAGNOSIS — L309 Dermatitis, unspecified: Secondary | ICD-10-CM | POA: Insufficient documentation

## 2022-02-20 NOTE — Assessment & Plan Note (Signed)
Likely L C2 zoster.   Wouldn't need antiviral at this point.   Zyrtec prn itching, aleve prn discomfort.  Path/phys c/w pt.  Routine cautions d/w pt.  She'll update me as needed.

## 2022-02-20 NOTE — Progress Notes (Signed)
Lesion on scalp since Wednesday; is sore to touch. L side of upper scalp.  Not noted on exam- this apparently resolved.   Lesion behind left ear x 1 week is itchy and painful. It prev scabbed over.  ~1cm scabbed area.   Lesions on L side of the neck x 3 days are also itchy and painful. These are resolving with minimal residual irritation.    Just noted an itchy spot on the L occiput.  Small resolving scabbed area, ~1cm  B HA prev, not noted today.  Prev would last for hours at a time.  Aleve helped.  Neck has been sore for a few weeks. Some "pulling" with extremes of ROM.  No R sided neck or scalp lesions.  Normal sensation on the face.    Humidifier helped with congestion.  Hasn't needed zyrtec recently.    She has prev zostavax.    Also with similar scabbed lesion on the L pinna.  Just noted at time of exam.  See below.    Meds, vitals, and allergies reviewed.   ROS: Per HPI unless specifically indicated in ROS section   Nad Ncat No upper scalp lesion seen ~1cm scabbed lesion, L occiput, healing over.  ~1cm scabbed lesion, L pinna L Canal and TM wnl o/w.  R canal TM and pinna wnl.  ~1cm scabbed lesion, inferior to L pinna Resolving areas of irritation L anterior neck.  No LA MMM, OP wnl

## 2022-03-02 DIAGNOSIS — Z1231 Encounter for screening mammogram for malignant neoplasm of breast: Secondary | ICD-10-CM | POA: Diagnosis not present

## 2022-04-06 DIAGNOSIS — Z961 Presence of intraocular lens: Secondary | ICD-10-CM | POA: Diagnosis not present

## 2022-06-29 ENCOUNTER — Ambulatory Visit (INDEPENDENT_AMBULATORY_CARE_PROVIDER_SITE_OTHER)
Admission: RE | Admit: 2022-06-29 | Discharge: 2022-06-29 | Disposition: A | Discharge status: Home / Self Care | Payer: Medicare Other | Source: Ambulatory Visit | Attending: Family Medicine | Admitting: Family Medicine

## 2022-06-29 ENCOUNTER — Other Ambulatory Visit: Payer: Self-pay | Admitting: Family Medicine

## 2022-06-29 ENCOUNTER — Encounter: Payer: Self-pay | Admitting: Family Medicine

## 2022-06-29 ENCOUNTER — Ambulatory Visit (INDEPENDENT_AMBULATORY_CARE_PROVIDER_SITE_OTHER): Payer: Medicare Other | Admitting: Family Medicine

## 2022-06-29 VITALS — BP 112/70 | HR 88 | Temp 98.2°F | Ht 63.0 in | Wt 148.0 lb

## 2022-06-29 DIAGNOSIS — R918 Other nonspecific abnormal finding of lung field: Secondary | ICD-10-CM | POA: Diagnosis not present

## 2022-06-29 DIAGNOSIS — R051 Acute cough: Secondary | ICD-10-CM | POA: Diagnosis not present

## 2022-06-29 DIAGNOSIS — R059 Cough, unspecified: Secondary | ICD-10-CM | POA: Diagnosis not present

## 2022-06-29 MED ORDER — AZITHROMYCIN 250 MG PO TABS: ORAL_TABLET | ORAL | 0 refills | Status: DC

## 2022-06-29 NOTE — Progress Notes (Signed)
Patient ID: [name] Price, female    DOB: 1940/12/28, 82 y.o.   MRN: [id number]  This visit was conducted in person.  BP 112/70 (BP Location: Left Arm, Patient Position: Sitting, Cuff Size: Normal)   Pulse 88   Temp 98.2 F (36.8 C) (Temporal)   Ht 5\' 3"  (1.6 m)   Wt 148 lb (67.1 kg)   SpO2 96%   BMI 26.22 kg/m    CC:  Chief Complaint  Patient presents with   Cough    With yellow phlegm-Started about a week ago   Wheezing    When laying down   Ear Pain    Left    Subjective:   HPI: [name] Price is a 82 y.o. female presenting on 06/29/2022 for Cough (With yellow phlegm-Started about a week ago), Wheezing (When laying down), and Ear Pain (Left)   Date of onset: 1 week ago Initial symptoms included cough and left ear pain, fatigue. Symptoms progressed to productive cough with yellow sputum, wheezing when laying down.   Chest congestion.  No fever.  No SOB.  No face pain, no ST.  No body aches new.  Sick contacts:  none COVID testing:   none    She has tried to treat with  mucinex for congestion   BP Readings from Last 3 Encounters:  06/29/22 112/70  02/20/22 104/80  06/30/21 98/74      No history of chronic lung disease such as asthma or COPD. Non-smoker.... significant second hand smoking.       Relevant past medical, surgical, family and social history reviewed and updated as indicated. Interim medical history since our last visit reviewed. Allergies and medications reviewed and updated. Outpatient Medications Prior to Visit  Medication Sig Dispense Refill   B Complex Vitamins (VITAMIN B COMPLEX) TABS Take 1 tablet by mouth every morning.     Calcium Carbonate-Vitamin D (CALCIUM-D PO) Take 1 tablet by mouth 2 (two) times daily.     cetirizine (ZYRTEC) 10 MG tablet Take 10 mg by mouth daily.     Cholecalciferol (VITAMIN D-3 PO) Take 1 tablet by mouth every morning.     naproxen sodium (ALEVE) 220 MG tablet Take 220 mg by mouth daily as  needed.     PARoxetine (PAXIL-CR) 25 MG 24 hr tablet Take 1 tablet (25 mg total) by mouth every morning. 90 tablet 3   tiZANidine (ZANAFLEX) 2 MG tablet TAKE 1 TABLET (2MG  TOTAL) BY MOUTH TWICE A DAY AS NEEDED FOR MUSCLE SPASM 30 tablet 0   acetaminophen (TYLENOL) 500 MG tablet Take 500 mg by mouth daily as needed (pain).     cetirizine (ZYRTEC) 10 MG chewable tablet Chew 10 mg by mouth daily. (Patient not taking: Reported on 02/20/2022)     No facility-administered medications prior to visit.     Per HPI unless specifically indicated in ROS section below Review of Systems  Constitutional:  Negative for fatigue and fever.  HENT:  Negative for congestion.   Eyes:  Negative for pain.  Respiratory:  Negative for cough and shortness of breath.   Cardiovascular:  Negative for chest pain, palpitations and leg swelling.  Gastrointestinal:  Negative for abdominal pain.  Genitourinary:  Negative for dysuria and vaginal bleeding.  Musculoskeletal:  Negative for back pain.  Neurological:  Negative for syncope, light-headedness and headaches.  Psychiatric/Behavioral:  Negative for dysphoric mood.    Objective:  BP 112/70 (BP Location: Left Arm, Patient Position: Sitting, Cuff Size: Normal)  Pulse 88   Temp 98.2 F (36.8 C) (Temporal)   Ht 5\' 3"  (1.6 m)   Wt 148 lb (67.1 kg)   SpO2 96%   BMI 26.22 kg/m   Wt Readings from Last 3 Encounters:  06/29/22 148 lb (67.1 kg)  02/20/22 146 lb (66.2 kg)  09/06/21 145 lb (65.8 kg)      Physical Exam Constitutional:      General: She is not in acute distress.    Appearance: Normal appearance. She is well-developed. She is not ill-appearing or toxic-appearing.  HENT:     Head: Normocephalic.     Right Ear: Hearing, tympanic membrane, ear canal and external ear normal. Tympanic membrane is not erythematous, retracted or bulging.     Left Ear: Hearing, tympanic membrane, ear canal and external ear normal. Tympanic membrane is not erythematous,  retracted or bulging.     Nose: No mucosal edema or rhinorrhea.     Right Sinus: No maxillary sinus tenderness or frontal sinus tenderness.     Left Sinus: No maxillary sinus tenderness or frontal sinus tenderness.     Mouth/Throat:     Pharynx: Uvula midline.  Eyes:     General: Lids are normal. Lids are everted, no foreign bodies appreciated.     Conjunctiva/sclera: Conjunctivae normal.     Pupils: Pupils are equal, round, and reactive to light.  Neck:     Thyroid: No thyroid mass or thyromegaly.     Vascular: No carotid bruit.     Trachea: Trachea normal.  Cardiovascular:     Rate and Rhythm: Normal rate and regular rhythm.     Pulses: Normal pulses.     Heart sounds: Normal heart sounds, S1 normal and S2 normal. No murmur heard.    No friction rub. No gallop.  Pulmonary:     Effort: Pulmonary effort is normal. No tachypnea or respiratory distress.     Breath sounds: Examination of the left-middle field reveals rhonchi. Examination of the left-lower field reveals rhonchi. Rhonchi present. No decreased breath sounds, wheezing or rales.  Abdominal:     General: Bowel sounds are normal.     Palpations: Abdomen is soft.     Tenderness: There is no abdominal tenderness.  Musculoskeletal:     Cervical back: Normal range of motion and neck supple.  Skin:    General: Skin is warm and dry.     Findings: No rash.  Neurological:     Mental Status: She is alert.  Psychiatric:        Mood and Affect: Mood is not anxious or depressed.        Speech: Speech normal.        Behavior: Behavior normal. Behavior is cooperative.        Thought Content: Thought content normal.        Judgment: Judgment normal.       Results for orders placed or performed in visit on 04/05/21  Lipid panel  Result Value Ref Range   Cholesterol 202 (H) 0 - 200 mg/dL   Triglycerides 16.1 0.0 - 149.0 mg/dL   HDL 09.60 >45.40 mg/dL   VLDL 98.1 0.0 - 19.1 mg/dL   LDL Cholesterol 478 (H) 0 - 99 mg/dL   Total  CHOL/HDL Ratio 4    NonHDL 146.24   Iron  Result Value Ref Range   Iron 94 42 - 145 ug/dL  VITAMIN D 25 Hydroxy (Vit-D Deficiency, Fractures)  Result Value Ref Range   VITD 70.02  30.00 - 100.00 ng/mL  CBC with Differential/Platelet  Result Value Ref Range   WBC 6.6 4.0 - 10.5 K/uL   RBC 4.74 3.87 - 5.11 Mil/uL   Hemoglobin 13.7 12.0 - 15.0 g/dL   HCT 16.1 09.6 - 04.5 %   MCV 88.8 78.0 - 100.0 fl   MCHC 32.6 30.0 - 36.0 g/dL   RDW 40.9 81.1 - 91.4 %   Platelets 285.0 150.0 - 400.0 K/uL   Neutrophils Relative % 64.1 43.0 - 77.0 %   Lymphocytes Relative 25.6 12.0 - 46.0 %   Monocytes Relative 7.2 3.0 - 12.0 %   Eosinophils Relative 2.6 0.0 - 5.0 %   Basophils Relative 0.5 0.0 - 3.0 %   Neutro Abs 4.2 1.4 - 7.7 K/uL   Lymphs Abs 1.7 0.7 - 4.0 K/uL   Monocytes Absolute 0.5 0.1 - 1.0 K/uL   Eosinophils Absolute 0.2 0.0 - 0.7 K/uL   Basophils Absolute 0.0 0.0 - 0.1 K/uL  Comprehensive metabolic panel  Result Value Ref Range   Sodium 139 135 - 145 mEq/L   Potassium 4.4 3.5 - 5.1 mEq/L   Chloride 103 96 - 112 mEq/L   CO2 31 19 - 32 mEq/L   Glucose, Bld 81 70 - 99 mg/dL   BUN 24 (H) 6 - 23 mg/dL   Creatinine, Ser 7.82 0.40 - 1.20 mg/dL   Total Bilirubin 0.8 0.2 - 1.2 mg/dL   Alkaline Phosphatase 61 39 - 117 U/L   AST 20 0 - 37 U/L   ALT 13 0 - 35 U/L   Total Protein 6.7 6.0 - 8.3 g/dL   Albumin 4.1 3.5 - 5.2 g/dL   GFR 95.62 >13.08 mL/min   Calcium 9.7 8.4 - 10.5 mg/dL    Assessment and Plan  Acute cough Assessment & Plan: Acute, most likely initial viral infection versus she is now with concern for bacterial superinfection.  Abnormal left lung field concerning for pneumonia although patient without fever and shortness of breath. Will evaluate with chest x-ray.  Likely treat with broad-spectrum antibiotic.  Continue Mucinex as needed for cough.  Rest and keep up with fluids.  Return and ER precautions provided.  Orders: -     DG Chest 2 View; Future  Lung field  abnormal finding on examination -     DG Chest 2 View; Future    No follow-ups on file.   Kerby Nora, MD

## 2022-06-29 NOTE — Assessment & Plan Note (Signed)
Acute, most likely initial viral infection versus she is now with concern for bacterial superinfection.  Abnormal left lung field concerning for pneumonia although patient without fever and shortness of breath. Will evaluate with chest x-ray.  Likely treat with broad-spectrum antibiotic.  Continue Mucinex as needed for cough.  Rest and keep up with fluids.  Return and ER precautions provided.

## 2022-07-04 ENCOUNTER — Other Ambulatory Visit: Payer: Self-pay | Admitting: Family Medicine

## 2022-08-17 ENCOUNTER — Encounter: Payer: Self-pay | Admitting: Internal Medicine

## 2022-08-27 ENCOUNTER — Encounter: Payer: Self-pay | Admitting: Primary Care

## 2022-08-27 ENCOUNTER — Ambulatory Visit (INDEPENDENT_AMBULATORY_CARE_PROVIDER_SITE_OTHER): Payer: Medicare Other | Admitting: Primary Care

## 2022-08-27 VITALS — BP 112/74 | HR 82 | Temp 98.3°F | Ht 63.0 in | Wt 146.4 lb

## 2022-08-27 DIAGNOSIS — H1032 Unspecified acute conjunctivitis, left eye: Secondary | ICD-10-CM

## 2022-08-27 DIAGNOSIS — R051 Acute cough: Secondary | ICD-10-CM | POA: Diagnosis not present

## 2022-08-27 LAB — POC COVID19 BINAXNOW: SARS Coronavirus 2 Ag: NEGATIVE

## 2022-08-27 MED ORDER — POLYMYXIN B-TRIMETHOPRIM 10000-0.1 UNIT/ML-% OP SOLN: 1.0000 [drp] | Freq: Four times a day (QID) | OPHTHALMIC | 0 refills | Status: DC

## 2022-08-27 NOTE — Patient Instructions (Signed)
Start the eyedrops for potential infection.  Instill 1 drop into the left eye every 6 hours while awake for 5 to 7 days.  You may use the Tessalon Perles as needed for cough as discussed.  Symptoms should be improved by Friday or Saturday this week.  Please let us know if this is not the case.  It was a pleasure meeting you!

## 2022-08-27 NOTE — Progress Notes (Signed)
Subjective:    Patient ID: [name] Price, female    DOB: 04-Mar-1940, 82 y.o.   MRN: [id number]  HPI  [name] Price is a very pleasant 82 y.o. female patient of Dr. Para March with a history of hypertension, BPV who presents today to discuss cough.  Symptom onset two days ago with cough. Yesterday she began to notice left eye pain. This morning her left eye was crusted shut with white crust. Her eye is itchy, "feels like a film over the eye".   She's applied artificial tears, taken Mucinex and Sudafed without improvement.   Review of Systems  Constitutional:  Positive for fatigue. Negative for chills and fever.  HENT:  Positive for congestion and sinus pressure. Negative for sore throat.   Eyes:  Positive for pain, redness and itching.  Respiratory:  Positive for cough. Negative for shortness of breath.          Past Medical History:  Diagnosis Date   Allergy    Anxiety    Arthritis    Blood transfusion without reported diagnosis    as baby   Breast cancer (HCC) 1999-2000   s/p lumpectomy and radiation, no chemo-right   History of chicken pox    History of hip replacement    Hot flashes    treated wtih paxil   Hyperlipidemia    Osteoporosis    DXA 11/12/17   UTI (urinary tract infection)     Social History   Socioeconomic History   Marital status: Married    Spouse name: Not on file   Number of children: Not on file   Years of education: Not on file   Highest education level: Not on file  Occupational History   Not on file  Tobacco Use   Smoking status: Never   Smokeless tobacco: Never  Vaping Use   Vaping status: Never Used  Substance and Sexual Activity   Alcohol use: No    Comment: rare once a year   Drug use: No   Sexual activity: Not Currently  Other Topics Concern   Not on file  Social History Narrative   Married 1963   Likes to travel   2 daughters   Social Determinants of Health   Financial Resource Strain: Low Risk  (09/06/2021)    Overall Financial Resource Strain (CARDIA)    Difficulty of Paying Living Expenses: Not hard at all  Food Insecurity: No Food Insecurity (09/06/2021)   Hunger Vital Sign    Worried About Running Out of Food in the Last Year: Never true    Ran Out of Food in the Last Year: Never true  Transportation Needs: No Transportation Needs (09/06/2021)   PRAPARE - Administrator, Civil Service (Medical): No    Lack of Transportation (Non-Medical): No  Physical Activity: Insufficiently Active (09/06/2021)   Exercise Vital Sign    Days of Exercise per Week: 7 days    Minutes of Exercise per Session: 20 min  Stress: No Stress Concern Present (09/06/2021)   [employer] of Occupational Health - Occupational Stress Questionnaire    Feeling of Stress : Not at all  Social Connections: Not on file  Intimate Partner Violence: Not on file    Past Surgical History:  Procedure Laterality Date   APPLICATION OF WOUND VAC Right 11/08/2020   Procedure: APPLICATION OF WOUND VAC;  Surgeon: Allena Napoleon, MD;  Location: [facility];  Service: Plastics;  Laterality: Right;   BREAST SURGERY Right  CATARACT EXTRACTION W/PHACO Left 12/28/2014   Procedure: CATARACT EXTRACTION PHACO AND INTRAOCULAR LENS PLACEMENT (IOC);  Surgeon: Galen Manila, MD;  Location: [facility];  Service: Ophthalmology;  Laterality: Left;  [place] 00:38 AP% 19.9 CDE 7.70 fluid pack lot # [id number]   CATARACT EXTRACTION W/PHACO Right 07/10/2016   Procedure: CATARACT EXTRACTION PHACO AND INTRAOCULAR LENS PLACEMENT (IOC);  Surgeon: Galen Manila, MD;  Location: [facility];  Service: Ophthalmology;  Laterality: Right;  [place] 00:38 AP% 18.3 CDE 7.06 Fluid pack lot # [id number]   COLONOSCOPY  2016   DEBRIDEMENT AND CLOSURE WOUND Right 11/01/2020   Procedure: DEBRIDEMENT RIGHT FOOT WOUND;  Surgeon: Allena Napoleon, MD;  Location: [facility];  Service: Plastics;  Laterality: Right;  60 minutes   DEBRIDEMENT AND CLOSURE WOUND Right 11/08/2020    Procedure: Debridement of r. foot wound;  Surgeon: Allena Napoleon, MD;  Location: [facility];  Service: Plastics;  Laterality: Right;   EYE SURGERY     JOINT REPLACEMENT Bilateral    hip  / shoulder   OOPHORECTOMY Left    POLYPECTOMY     SKIN SPLIT GRAFT Right 01/13/2021   Procedure: SKIN GRAFT SPLIT THICKNESS;  Surgeon: Allena Napoleon, MD;  Location: [facility];  Service: Plastics;  Laterality: Right;   TOTAL HIP ARTHROPLASTY Bilateral    (2) Two   TOTAL SHOULDER REPLACEMENT      Family History  Problem Relation Age of Onset   Lung cancer Mother    Heart disease Mother    Colon cancer Mother        late 81's   Cancer Father    Colon cancer Father 61       died at 66   Breast cancer Maternal Aunt    Breast cancer Maternal Aunt    Breast cancer Maternal Aunt    Rectal cancer Neg Hx    Stomach cancer Neg Hx    Crohn's disease Neg Hx    Esophageal cancer Neg Hx     Allergies  Allergen Reactions   Codeine Nausea And Vomiting   Fosamax [Alendronate]     GI intolerance and "didn't feel well on medicine."      Current Outpatient Medications on File Prior to Visit  Medication Sig Dispense Refill   B Complex Vitamins (VITAMIN B COMPLEX) TABS Take 1 tablet by mouth every morning.     Calcium Carbonate-Vitamin D (CALCIUM-D PO) Take 1 tablet by mouth 2 (two) times daily.     cetirizine (ZYRTEC) 10 MG tablet Take 10 mg by mouth daily.     Cholecalciferol (VITAMIN D-3 PO) Take 1 tablet by mouth every morning.     naproxen sodium (ALEVE) 220 MG tablet Take 220 mg by mouth daily as needed.     PARoxetine (PAXIL-CR) 25 MG 24 hr tablet TAKE 1 TABLET BY MOUTH EVERY DAY IN THE MORNING 90 tablet 3   tiZANidine (ZANAFLEX) 2 MG tablet TAKE 1 TABLET (2MG  TOTAL) BY MOUTH TWICE A DAY AS NEEDED FOR MUSCLE SPASM 30 tablet 0   No current facility-administered medications on file prior to visit.    BP 112/74   Pulse 82   Temp 98.3 F (36.8 C) (Temporal)   Ht 5\' 3"  (1.6 m)   Wt  146 lb 6.4 oz (66.4 kg)   SpO2 96%   BMI 25.93 kg/m  Objective:   Physical Exam Constitutional:      Appearance: She is not ill-appearing.  HENT:     Right  Ear: Tympanic membrane and ear canal normal.     Left Ear: Tympanic membrane and ear canal normal.     Nose:     Right Sinus: No maxillary sinus tenderness or frontal sinus tenderness.     Left Sinus: No maxillary sinus tenderness or frontal sinus tenderness.     Mouth/Throat:     Pharynx: No posterior oropharyngeal erythema.  Eyes:     General:        Left eye: Discharge present.    Conjunctiva/sclera:     Left eye: Exudate and hemorrhage present.     Comments: Erythema surrounding left eye with mild swelling. Conjunctival hemorrhage noted. Scant amount of whitish exudate.  Cardiovascular:     Rate and Rhythm: Normal rate and regular rhythm.  Pulmonary:     Effort: Pulmonary effort is normal.     Breath sounds: Normal breath sounds. No wheezing or rales.  Musculoskeletal:     Cervical back: Neck supple.  Lymphadenopathy:     Cervical: No cervical adenopathy.  Skin:    General: Skin is warm and dry.  Neurological:     Mental Status: She is alert.           Assessment & Plan:  Acute cough Assessment & Plan: Symptoms and presentation representative of viral etiology at this point. Respiratory exam reassuring.  Treat with conservative methods.  Close follow-up recommended if no improvement in 5 days.  Orders: -     POC COVID-19 BinaxNow  Acute conjunctivitis of left eye, unspecified acute conjunctivitis type -     Polymyxin B-Trimethoprim; Place 1 drop into the left eye every 6 (six) hours. For 5-7 days  Dispense: 10 mL; Refill: 0        Doreene Nest, NP

## 2022-08-27 NOTE — Assessment & Plan Note (Signed)
Symptoms and presentation representative of viral etiology at this point. Respiratory exam reassuring.  Treat with conservative methods.  Close follow-up recommended if no improvement in 5 days.

## 2022-08-29 ENCOUNTER — Telehealth: Payer: Self-pay | Admitting: Family Medicine

## 2022-08-29 DIAGNOSIS — R051 Acute cough: Secondary | ICD-10-CM

## 2022-08-29 NOTE — Telephone Encounter (Signed)
Called patient states that the left eye has gotten little better but now it is in right eye. Patient states she is having congestion with productive green sputum that started yesterday. Denies any fever. Wanted to know if oral antibiotic is needed now that she has cough.Marland Kitchen

## 2022-08-29 NOTE — Telephone Encounter (Signed)
Have her use the eyedrops in the right eye and update [place] again Friday this week.

## 2022-08-29 NOTE — Telephone Encounter (Signed)
Patient called in and stated that she seen Mayra Reel on Monday. She stated that Monday only one of her eyes was swollen but now both are swollen and she is coughing up yellow phlegm. She was wanting to know if she needs an antibiotic now. Please advise. Thank you!

## 2022-08-29 NOTE — Telephone Encounter (Addendum)
Please notify patient that she was prescribed an antibiotic eyedrop.  Has she been using the eyedrops?  Has the left eye improved?

## 2022-08-30 NOTE — Telephone Encounter (Signed)
Called patient states that she started putting drops in right eye. She has had some improvement in eye symptoms but wanted to know she is more concerned about the cough and if you could send antibiotic in for that. States she has not had any fever.

## 2022-08-30 NOTE — Addendum Note (Signed)
Addended by: Doreene Nest on: 08/30/2022 01:25 PM   Modules accepted: Orders

## 2022-08-30 NOTE — Telephone Encounter (Signed)
Her exam didn't show signs of bacterial infection. As discussed, she may feel worse before she gets better. Viral colds can last for 7-0 days. She should be improving by Saturday.   Let's have her come in today or tomorrow for a chest xray at her convenience. That will help [place] to understand if she's developing a bacterial infection. I will order now.

## 2022-08-30 NOTE — Telephone Encounter (Signed)
Called patient reviewed all information and repeated back to me. Will call if any questions.  If she is feeling like it she will come by tomorrow for xray. Gave her times that she can do that. If any other questions she will call office.

## 2022-11-19 ENCOUNTER — Telehealth: Payer: Self-pay | Admitting: Family Medicine

## 2022-11-19 DIAGNOSIS — H1032 Unspecified acute conjunctivitis, left eye: Secondary | ICD-10-CM

## 2022-11-19 MED ORDER — POLYMYXIN B-TRIMETHOPRIM 10000-0.1 UNIT/ML-% OP SOLN: 1.0000 [drp] | Freq: Four times a day (QID) | OPHTHALMIC | 0 refills | Status: DC

## 2022-11-19 NOTE — Telephone Encounter (Signed)
Called patient and reviewed all information. Patient verbalized understanding. Will call if any further questions.  

## 2022-11-19 NOTE — Telephone Encounter (Signed)
Prescription Request  11/19/2022  LOV: 02/20/2022  What is the name of the medication or equipment? trimethoprim-polymyxin b (POLYTRIM) ophthalmic solution   Have you contacted your pharmacy to request a refill? No   Which pharmacy would you like this sent to?  [name], [place] - [name] [place] [address] Phone: [phone] Fax: [phone]    Patient notified that their request is being sent to the clinical staff for review and that they should receive a response within 2 business days.   Please advise at [facility] [phone]  PT says she is having the same symptoms as visit from 7/15, asked if these could be refilled for her. Advised pt that she may need to be seen before refilling, patient wanted to try to get them refilled. Please advise if patient will need to be seen

## 2022-11-19 NOTE — Telephone Encounter (Signed)
Sent. If not better in the meantime, then needs recheck. Thanks.

## 2022-11-19 NOTE — Telephone Encounter (Signed)
These were given by Mayra Reel, NP in July; okay to refill?

## 2022-11-21 ENCOUNTER — Encounter: Payer: Self-pay | Admitting: Internal Medicine

## 2022-11-21 ENCOUNTER — Ambulatory Visit (INDEPENDENT_AMBULATORY_CARE_PROVIDER_SITE_OTHER): Payer: Medicare Other | Admitting: Internal Medicine

## 2022-11-21 VITALS — BP 102/60 | HR 80 | Temp 99.0°F | Ht 63.0 in | Wt 148.0 lb

## 2022-11-21 DIAGNOSIS — H5789 Other specified disorders of eye and adnexa: Secondary | ICD-10-CM | POA: Diagnosis not present

## 2022-11-21 DIAGNOSIS — S73005A Unspecified dislocation of left hip, initial encounter: Secondary | ICD-10-CM | POA: Diagnosis not present

## 2022-11-21 NOTE — Assessment & Plan Note (Signed)
Not infection--reaction to something? Asked her to stop her cream for now Trial cetirizine 10mg  daily for now--can stop when things normalize

## 2022-11-21 NOTE — Assessment & Plan Note (Signed)
Asked her to check with ortho It has been happening more now--but she can always get it back in

## 2022-11-21 NOTE — Progress Notes (Signed)
Subjective:    Patient ID: [name] Price, female    DOB: [birth year], 82 y.o.   MRN: [id number]  HPI Here due to eye swelling  Having some swelling around both eyes--for 2 weeks Some redness but not in the eye Tried pink eye drops--no help Some white stuff in inner canthus Slight eye itching--mostly gone after the morning  Uses moisturizer cream around eyes--long standing  No fall allergies that she is aware of  No new foods are products Did have house sprayed for bugs--- 3 months ago--and then again last week Swelling may have predated the spray  Having some left leg trouble Told "muscle retraction" by ortho 2 years ago Larey Seat due to this recently  Current Outpatient Medications on File Prior to Visit  Medication Sig Dispense Refill   B Complex Vitamins (VITAMIN B COMPLEX) TABS Take 1 tablet by mouth every morning.     Calcium Carbonate-Vitamin D (CALCIUM-D PO) Take 1 tablet by mouth 2 (two) times daily.     cetirizine (ZYRTEC) 10 MG tablet Take 10 mg by mouth daily.     Cholecalciferol (VITAMIN D-3 PO) Take 1 tablet by mouth every morning.     naproxen sodium (ALEVE) 220 MG tablet Take 220 mg by mouth daily as needed.     PARoxetine (PAXIL-CR) 25 MG 24 hr tablet TAKE 1 TABLET BY MOUTH EVERY DAY IN THE MORNING 90 tablet 3   tiZANidine (ZANAFLEX) 2 MG tablet TAKE 1 TABLET (2MG  TOTAL) BY MOUTH TWICE A DAY AS NEEDED FOR MUSCLE SPASM (Patient not taking: Reported on 11/21/2022) 30 tablet 0   No current facility-administered medications on file prior to visit.    Allergies  Allergen Reactions   Codeine Nausea And Vomiting   Fosamax [Alendronate]     GI intolerance and "didn't feel well on medicine."      Past Medical History:  Diagnosis Date   Allergy    Anxiety    Arthritis    Blood transfusion without reported diagnosis    as baby   Breast cancer (HCC) 1999-2000   s/p lumpectomy and radiation, no chemo-right   History of chicken pox    History of hip replacement     Hot flashes    treated wtih paxil   Hyperlipidemia    Osteoporosis    DXA 11/12/17   UTI (urinary tract infection)     Past Surgical History:  Procedure Laterality Date   APPLICATION OF WOUND VAC Right 11/08/2020   Procedure: APPLICATION OF WOUND VAC;  Surgeon: Allena Napoleon, MD;  Location: [facility];  Service: Plastics;  Laterality: Right;   BREAST SURGERY Right    CATARACT EXTRACTION W/PHACO Left 12/28/2014   Procedure: CATARACT EXTRACTION PHACO AND INTRAOCULAR LENS PLACEMENT (IOC);  Surgeon: Galen Manila, MD;  Location: [facility];  Service: Ophthalmology;  Laterality: Left;  [place] 00:38 AP% 19.9 CDE 7.70 fluid pack lot # [id number]   CATARACT EXTRACTION W/PHACO Right 07/10/2016   Procedure: CATARACT EXTRACTION PHACO AND INTRAOCULAR LENS PLACEMENT (IOC);  Surgeon: Galen Manila, MD;  Location: [facility];  Service: Ophthalmology;  Laterality: Right;  [place] 00:38 AP% 18.3 CDE 7.06 Fluid pack lot # [id number]   COLONOSCOPY  2016   DEBRIDEMENT AND CLOSURE WOUND Right 11/01/2020   Procedure: DEBRIDEMENT RIGHT FOOT WOUND;  Surgeon: Allena Napoleon, MD;  Location: [facility];  Service: Plastics;  Laterality: Right;  60 minutes   DEBRIDEMENT AND CLOSURE WOUND Right 11/08/2020   Procedure: Debridement of r. foot wound;  Surgeon: Allena Napoleon, MD;  Location: [facility];  Service: Plastics;  Laterality: Right;   EYE SURGERY     JOINT REPLACEMENT Bilateral    hip  / shoulder   OOPHORECTOMY Left    POLYPECTOMY     SKIN SPLIT GRAFT Right 01/13/2021   Procedure: SKIN GRAFT SPLIT THICKNESS;  Surgeon: Allena Napoleon, MD;  [facility];  Service: Plastics;  Laterality: Right;   TOTAL HIP ARTHROPLASTY Bilateral    (2) Two   TOTAL SHOULDER REPLACEMENT      Family History  Problem Relation Age of Onset   Lung cancer Mother    Heart disease Mother    Colon cancer Mother        late 10's   Cancer Father    Colon cancer Father 69       died at 60   Breast cancer Maternal Aunt     Breast cancer Maternal Aunt    Breast cancer Maternal Aunt    Rectal cancer Neg Hx    Stomach cancer Neg Hx    Crohn's disease Neg Hx    Esophageal cancer Neg Hx     Social History   Socioeconomic History   Marital status: Married    Spouse name: Not on file   Number of children: Not on file   Years of education: Not on file   Highest education level: Not on file  Occupational History   Not on file  Tobacco Use   Smoking status: Never   Smokeless tobacco: Never  Vaping Use   Vaping status: Never Used  Substance and Sexual Activity   Alcohol use: No    Comment: rare once a year   Drug use: No   Sexual activity: Not Currently  Other Topics Concern   Not on file  Social History Narrative   Married 1963   Likes to travel   2 daughters   Social Determinants of Health   Financial Resource Strain: Low Risk  (09/06/2021)   Overall Financial Resource Strain (CARDIA)    Difficulty of Paying Living Expenses: Not hard at all  Food Insecurity: No Food Insecurity (09/06/2021)   Hunger Vital Sign    Worried About Running Out of Food in the Last Year: Never true    Ran Out of Food in the Last Year: Never true  Transportation Needs: No Transportation Needs (09/06/2021)   PRAPARE - Administrator, Civil Service (Medical): No    Lack of Transportation (Non-Medical): No  Physical Activity: Insufficiently Active (09/06/2021)   Exercise Vital Sign    Days of Exercise per Week: 7 days    Minutes of Exercise per Session: 20 min  Stress: No Stress Concern Present (09/06/2021)   [employer] of Occupational Health - Occupational Stress Questionnaire    Feeling of Stress : Not at all  Social Connections: Not on file  Intimate Partner Violence: Not on file   Review of Systems No fever Not sick    Objective:   Physical Exam Constitutional:      Appearance: Normal appearance.  Eyes:     Comments: Slight periorbital swelling Skin changes especially right upper  lid No conjunctival findings (no inflammation)  Musculoskeletal:     Comments: Left hip seems to dislocate with internal rotation  Neurological:     Mental Status: She is alert.            Assessment & Plan:

## 2022-12-10 ENCOUNTER — Ambulatory Visit (INDEPENDENT_AMBULATORY_CARE_PROVIDER_SITE_OTHER): Payer: Medicare Other | Admitting: Orthopaedic Surgery

## 2022-12-10 ENCOUNTER — Other Ambulatory Visit (INDEPENDENT_AMBULATORY_CARE_PROVIDER_SITE_OTHER): Payer: Self-pay

## 2022-12-10 ENCOUNTER — Encounter: Payer: Self-pay | Admitting: Orthopaedic Surgery

## 2022-12-10 DIAGNOSIS — Z96642 Presence of left artificial hip joint: Secondary | ICD-10-CM | POA: Diagnosis not present

## 2022-12-10 DIAGNOSIS — M25552 Pain in left hip: Secondary | ICD-10-CM

## 2022-12-10 MED ORDER — LIDOCAINE HCL 1 % IJ SOLN
3.0000 mL | INTRAMUSCULAR | Status: AC | PRN
  Administered 2022-12-10: 3 mL

## 2022-12-10 MED ORDER — METHYLPREDNISOLONE ACETATE 40 MG/ML IJ SUSP
40.0000 mg | INTRAMUSCULAR | Status: AC | PRN
  Administered 2022-12-10: 40 mg via INTRA_ARTICULAR

## 2022-12-10 NOTE — Progress Notes (Signed)
The patient is someone I am seeing for the first time.  I have actually done surgery on her friend of hers.  She has a history of both her hips being replaced many years ago.  It has been over 15 years.  This was done through a posterior approach by one of my colleagues in town who is since retired.  She has a history of the left hip giving way.  She says she has fallen at times and felt like that hip is coming out of place.  It is never dislocated.  She does not walk with assistive device.  She is 82 years old.  This has been going on for about a year now.  She denies any history of infection or other issues.  She walks without an assistive device.  When I have her lay in a supine position her leg lengths are equal.  When I do put her hip through range of motion I can feel what she is feeling in terms of some weakness in that left hip and the possibility that hip may be subluxating.  She does have pain over the trochanteric area of the hip.  An AP pelvis and lateral of the left hip shows both her hips are well-seated in terms of the replacements with no obvious evidence of osteolysis or loosening.  There is no significant polyethylene liner wear.  I did talk to her about a steroid injection of her trochanteric area which she agreed to and tolerated well.  I have shown her hip abduction strengthening exercises I want her to try.  She will still adhere to posterior hip precautions.  I would like to see her back in a month to see how she is doing overall but no x-rays are needed.  She agrees with this treatment plan.    Procedure Note  Patient: [name] Price             Date of Birth: [birth year]           MRN: [id number]             Visit Date: 12/10/2022  Procedures: Visit Diagnoses:  1. Pain in left hip   2. History of left hip replacement     Large Joint Inj: L greater trochanter on 12/10/2022 4:23 PM Indications: pain and diagnostic evaluation Details: 22 G 1.5 in needle, lateral  approach  Arthrogram: No  Medications: 3 mL lidocaine 1 %; 40 mg methylPREDNISolone acetate 40 MG/ML Outcome: tolerated well, no immediate complications Procedure, treatment alternatives, risks and benefits explained, specific risks discussed. Consent was given by the patient. Immediately prior to procedure a time out was called to verify the correct patient, procedure, equipment, support staff and site/side marked as required. Patient was prepped and draped in the usual sterile fashion.

## 2022-12-17 ENCOUNTER — Ambulatory Visit (INDEPENDENT_AMBULATORY_CARE_PROVIDER_SITE_OTHER): Payer: Medicare Other | Admitting: Internal Medicine

## 2022-12-17 ENCOUNTER — Encounter: Payer: Self-pay | Admitting: Internal Medicine

## 2022-12-17 VITALS — BP 122/80 | HR 84 | Temp 98.5°F | Ht 63.0 in | Wt 147.0 lb

## 2022-12-17 DIAGNOSIS — L0390 Cellulitis, unspecified: Secondary | ICD-10-CM | POA: Insufficient documentation

## 2022-12-17 DIAGNOSIS — Z23 Encounter for immunization: Secondary | ICD-10-CM

## 2022-12-17 DIAGNOSIS — L03114 Cellulitis of left upper limb: Secondary | ICD-10-CM

## 2022-12-17 MED ORDER — DOXYCYCLINE HYCLATE 100 MG PO TABS: 100.0000 mg | ORAL_TABLET | Freq: Two times a day (BID) | ORAL | 1 refills | Status: DC

## 2022-12-17 NOTE — Assessment & Plan Note (Signed)
Both arms Any trauma--hitting on garbage can/dog scratch--gives these circular crusted lesions No blisters to start Most consistent with MRSA Will treat with doxy 100 bid x 7 days (with refill) If recurs--would try nasal mupirocin

## 2022-12-17 NOTE — Progress Notes (Signed)
Subjective:    Patient ID: [name] Price, female    DOB: 08-12-1940, 82 y.o.   MRN: [id number]  HPI Here due to dog scratches on her arms she wants checked  New puppy for about a month Jumps on her---needs to get toenails trimmed Has had several scratches on forearms Also scrapped skin off right forearm area--but still red and not healing on time  Current Outpatient Medications on File Prior to Visit  Medication Sig Dispense Refill   B Complex Vitamins (VITAMIN B COMPLEX) TABS Take 1 tablet by mouth every morning.     Calcium Carbonate-Vitamin D (CALCIUM-D PO) Take 1 tablet by mouth 2 (two) times daily.     cetirizine (ZYRTEC) 10 MG tablet Take 10 mg by mouth daily.     Cholecalciferol (VITAMIN D-3 PO) Take 1 tablet by mouth every morning.     naproxen sodium (ALEVE) 220 MG tablet Take 220 mg by mouth daily as needed.     PARoxetine (PAXIL-CR) 25 MG 24 hr tablet TAKE 1 TABLET BY MOUTH EVERY DAY IN THE MORNING 90 tablet 3   tiZANidine (ZANAFLEX) 2 MG tablet TAKE 1 TABLET (2MG  TOTAL) BY MOUTH TWICE A DAY AS NEEDED FOR MUSCLE SPASM 30 tablet 0   No current facility-administered medications on file prior to visit.    Allergies  Allergen Reactions   Codeine Nausea And Vomiting   Fosamax [Alendronate]     GI intolerance and "didn't feel well on medicine."      Past Medical History:  Diagnosis Date   Allergy    Anxiety    Arthritis    Blood transfusion without reported diagnosis    as baby   Breast cancer (HCC) 1999-2000   s/p lumpectomy and radiation, no chemo-right   History of chicken pox    History of hip replacement    Hot flashes    treated wtih paxil   Hyperlipidemia    Osteoporosis    DXA 11/12/17   UTI (urinary tract infection)     Past Surgical History:  Procedure Laterality Date   APPLICATION OF WOUND VAC Right 11/08/2020   Procedure: APPLICATION OF WOUND VAC;  Surgeon: Allena Napoleon, MD;  Location: [facility];  Service: Plastics;  Laterality: Right;    BREAST SURGERY Right    CATARACT EXTRACTION W/PHACO Left 12/28/2014   Procedure: CATARACT EXTRACTION PHACO AND INTRAOCULAR LENS PLACEMENT (IOC);  Surgeon: Galen Manila, MD;  Location: [facility];  Service: Ophthalmology;  Laterality: Left;  [place] 00:38 AP% 19.9 CDE 7.70 fluid pack lot # [id number]   CATARACT EXTRACTION W/PHACO Right 07/10/2016   Procedure: CATARACT EXTRACTION PHACO AND INTRAOCULAR LENS PLACEMENT (IOC);  Surgeon: Galen Manila, MD;  Location: [facility];  Service: Ophthalmology;  Laterality: Right;  [place] 00:38 AP% 18.3 CDE 7.06 Fluid pack lot # [id number]   COLONOSCOPY  2016   DEBRIDEMENT AND CLOSURE WOUND Right 11/01/2020   Procedure: DEBRIDEMENT RIGHT FOOT WOUND;  Surgeon: Allena Napoleon, MD;  Location: [facility];  Service: Plastics;  Laterality: Right;  60 minutes   DEBRIDEMENT AND CLOSURE WOUND Right 11/08/2020   Procedure: Debridement of r. foot wound;  Surgeon: Allena Napoleon, MD;  Location: [facility];  Service: Plastics;  Laterality: Right;   EYE SURGERY     JOINT REPLACEMENT Bilateral    hip  / shoulder   OOPHORECTOMY Left    POLYPECTOMY     SKIN SPLIT GRAFT Right 01/13/2021   Procedure: SKIN GRAFT SPLIT THICKNESS;  Surgeon: Arita Miss,  Wendy Poet, MD;  Location: [facility];  Service: Plastics;  Laterality: Right;   TOTAL HIP ARTHROPLASTY Bilateral    (2) Two   TOTAL SHOULDER REPLACEMENT      Family History  Problem Relation Age of Onset   Lung cancer Mother    Heart disease Mother    Colon cancer Mother        late 54's   Cancer Father    Colon cancer Father 11       died at 55   Breast cancer Maternal Aunt    Breast cancer Maternal Aunt    Breast cancer Maternal Aunt    Rectal cancer Neg Hx    Stomach cancer Neg Hx    Crohn's disease Neg Hx    Esophageal cancer Neg Hx     Social History   Socioeconomic History   Marital status: Married    Spouse name: Not on file   Number of children: Not on file   Years of education: Not on file   Highest  education level: Not on file  Occupational History   Not on file  Tobacco Use   Smoking status: Never   Smokeless tobacco: Never  Vaping Use   Vaping status: Never Used  Substance and Sexual Activity   Alcohol use: No    Comment: rare once a year   Drug use: No   Sexual activity: Not Currently  Other Topics Concern   Not on file  Social History Narrative   Married 1963   Likes to travel   2 daughters   Social Determinants of Health   Financial Resource Strain: Low Risk  (09/06/2021)   Overall Financial Resource Strain (CARDIA)    Difficulty of Paying Living Expenses: Not hard at all  Food Insecurity: No Food Insecurity (09/06/2021)   Hunger Vital Sign    Worried About Running Out of Food in the Last Year: Never true    Ran Out of Food in the Last Year: Never true  Transportation Needs: No Transportation Needs (09/06/2021)   PRAPARE - Administrator, Civil Service (Medical): No    Lack of Transportation (Non-Medical): No  Physical Activity: Insufficiently Active (09/06/2021)   Exercise Vital Sign    Days of Exercise per Week: 7 days    Minutes of Exercise per Session: 20 min  Stress: No Stress Concern Present (09/06/2021)   [employer] of Occupational Health - Occupational Stress Questionnaire    Feeling of Stress : Not at all  Social Connections: Not on file  Intimate Partner Violence: Not on file   Review of Systems Eye is still puffy--but feels better Saw the orthopedist--and he has given her exercises to do    Objective:   Physical Exam Constitutional:      Appearance: Normal appearance.  Skin:    Comments: Multiple circular lesions with granulation (mostly around 2cm diameter) on both forearms Some surrounding redness and warmth  Neurological:     Mental Status: She is alert.            Assessment & Plan:

## 2023-01-07 ENCOUNTER — Other Ambulatory Visit: Payer: Self-pay

## 2023-01-07 ENCOUNTER — Encounter: Payer: Self-pay | Admitting: Orthopaedic Surgery

## 2023-01-07 ENCOUNTER — Ambulatory Visit (INDEPENDENT_AMBULATORY_CARE_PROVIDER_SITE_OTHER): Payer: Medicare Other | Admitting: Orthopaedic Surgery

## 2023-01-07 DIAGNOSIS — M25552 Pain in left hip: Secondary | ICD-10-CM | POA: Diagnosis not present

## 2023-01-07 DIAGNOSIS — Z96642 Presence of left artificial hip joint: Secondary | ICD-10-CM | POA: Diagnosis not present

## 2023-01-07 NOTE — Progress Notes (Signed)
The patient is a 82 year old female who will actually be 82 next week.  She comes in for follow-up as it relates to her left hip still having a popping sensation.  She has a history of both of her hips being replaced over 15 years ago by Dr. Thurston Hole through posterior approach.  When we saw her at her last visit it seemed a lot of her problems around the trochanteric area of the left hip so I did provide a steroid injection around that area and that is helped but she still has times where she feels like the hip pops on her.  It is never come out of place.  Previous x-rays did not show any polyethylene liner wear or evidence of loosening of the prosthesis.  I did not feel that there was a significant leg length difference.  She walks without an assistive device.  On exam I can easily put her left hip the range of motion of that hip.  I have had her work on hip abduction exercises and strengthening exercises.  Her pain is minimal and I could not elicit any type of popping sensation.  She did let me know that 2 weeks ago she had the same sensation of her and she cannot put weight on her hip for just a few minutes and then could weight-bear after that.  I would like to send her to one of my colleagues in town Dr. Mikey Kirschner with [facility] since he is a fellowship trained joint replacement specialist and is familiar with posterior hip surgery.  He can likely give a good opinion as a relates to what are the next steps for the patient's left hip.  She agrees with this referral.

## 2023-01-22 DIAGNOSIS — M160 Bilateral primary osteoarthritis of hip: Secondary | ICD-10-CM | POA: Diagnosis not present

## 2023-02-11 ENCOUNTER — Ambulatory Visit: Payer: Medicare Other | Admitting: Family Medicine

## 2023-02-11 ENCOUNTER — Encounter: Payer: Self-pay | Admitting: Family Medicine

## 2023-02-11 VITALS — BP 124/72 | HR 74 | Temp 98.1°F | Ht 63.0 in | Wt 147.1 lb

## 2023-02-11 DIAGNOSIS — M25559 Pain in unspecified hip: Secondary | ICD-10-CM | POA: Diagnosis not present

## 2023-02-11 DIAGNOSIS — Z01810 Encounter for preprocedural cardiovascular examination: Secondary | ICD-10-CM | POA: Diagnosis not present

## 2023-02-11 LAB — CBC WITH DIFFERENTIAL/PLATELET
Basophils Absolute: 0.1 10*3/uL (ref 0.0–0.1)
Basophils Relative: 1.2 % (ref 0.0–3.0)
Eosinophils Absolute: 0.1 10*3/uL (ref 0.0–0.7)
Eosinophils Relative: 2.7 % (ref 0.0–5.0)
HCT: 44.3 % (ref 36.0–46.0)
Hemoglobin: 14.7 g/dL (ref 12.0–15.0)
Lymphocytes Relative: 29.4 % (ref 12.0–46.0)
Lymphs Abs: 1.3 10*3/uL (ref 0.7–4.0)
MCHC: 33.2 g/dL (ref 30.0–36.0)
MCV: 93.3 fL (ref 78.0–100.0)
Monocytes Absolute: 0.4 10*3/uL (ref 0.1–1.0)
Monocytes Relative: 8.6 % (ref 3.0–12.0)
Neutro Abs: 2.7 10*3/uL (ref 1.4–7.7)
Neutrophils Relative %: 58.1 % (ref 43.0–77.0)
Platelets: 202 10*3/uL (ref 150.0–400.0)
RBC: 4.74 Mil/uL (ref 3.87–5.11)
RDW: 14.3 % (ref 11.5–15.5)
WBC: 4.6 10*3/uL (ref 4.0–10.5)

## 2023-02-11 LAB — COMPREHENSIVE METABOLIC PANEL
ALT: 11 U/L (ref 0–35)
AST: 24 U/L (ref 0–37)
Albumin: 4.2 g/dL (ref 3.5–5.2)
Alkaline Phosphatase: 57 U/L (ref 39–117)
BUN: 18 mg/dL (ref 6–23)
CO2: 29 meq/L (ref 19–32)
Calcium: 8.9 mg/dL (ref 8.4–10.5)
Chloride: 104 meq/L (ref 96–112)
Creatinine, Ser: 0.57 mg/dL (ref 0.40–1.20)
GFR: 86.04 mL/min (ref >60.00)
Glucose, Bld: 87 mg/dL (ref 70–99)
Potassium: 4.3 meq/L (ref 3.5–5.1)
Sodium: 140 meq/L (ref 135–145)
Total Bilirubin: 1 mg/dL (ref 0.2–1.2)
Total Protein: 6.5 g/dL (ref 6.0–8.3)

## 2023-02-11 NOTE — Patient Instructions (Signed)
Go to the lab on the way out.   If you have mychart we'll likely use that to update you.    Take care.  Glad to see you. We'll update ortho.   Assuming your labs are reasonable, then you should be appropriately low risk for surgery.

## 2023-02-12 DIAGNOSIS — M25559 Pain in unspecified hip: Secondary | ICD-10-CM | POA: Insufficient documentation

## 2023-02-12 NOTE — Assessment & Plan Note (Signed)
 D/w pt that I can't "clear" a patient for surgery but assuming her labs are reasonable then it would appear that she is appropriately low risk for surgery.  See notes on labs and hard copy note for ortho.

## 2023-02-19 ENCOUNTER — Telehealth: Payer: Self-pay | Admitting: Family Medicine

## 2023-02-19 NOTE — Telephone Encounter (Signed)
 Faxed over documentation as requested as well as called and left voicemail for [employer]

## 2023-02-19 NOTE — Telephone Encounter (Signed)
 Copied from CRM [id number]. Topic: General - Other >> Feb 19, 2023  2:05 PM Macario HERO wrote: Reason for CRM: Asberry from [facility] called - Received surgical clearance docs but missing important information.  Requesting last office notes and most recent labs in the last 3months. - Phone: [phone]  - Fax: [phone], [phone]

## 2023-02-26 ENCOUNTER — Telehealth: Payer: Self-pay | Admitting: Family Medicine

## 2023-02-26 NOTE — Telephone Encounter (Signed)
 Copied from CRM [id number]. Topic: General - Other >> Feb 26, 2023 11:00 AM [name] Price wrote: Reason for CRM: Patient has a telephone appointment visit for today at 10:10a for her medicare annual wellness visit and no one has contacted her yet to conduct the appointment. Please follow up with patient waiting on her appointment.

## 2023-03-06 DIAGNOSIS — Z1231 Encounter for screening mammogram for malignant neoplasm of breast: Secondary | ICD-10-CM | POA: Diagnosis not present

## 2023-03-07 ENCOUNTER — Other Ambulatory Visit: Payer: Self-pay | Admitting: Orthopedic Surgery

## 2023-03-08 NOTE — Progress Notes (Signed)
Sent message, via epic in basket, requesting orders in epic from [facility] adviser.

## 2023-03-11 NOTE — Patient Instructions (Signed)
SURGICAL WAITING ROOM VISITATION  Patients having surgery or a procedure may have no more than 2 support people in the waiting area - these visitors may rotate.    Children under the age of 9 must have an adult with them who is not the patient.  Due to an increase in RSV and influenza rates and associated hospitalizations, children ages 35 and under may not visit patients in [facility] hospitals.  Visitors with respiratory illnesses are discouraged from visiting and should remain at home.  If the patient needs to stay at the hospital during part of their recovery, the visitor guidelines for inpatient rooms apply. Pre-op nurse will coordinate an appropriate time for 1 support person to accompany patient in pre-op.  This support person may not rotate.    Please refer to the [facility] website for the visitor guidelines for Inpatients (after your surgery is over and you are in a regular room).       Your procedure is scheduled on:  03/25/2023    Report to [facility] Main Entrance    Report to admitting at   0515   Call this number if you have problems the morning of surgery [phone]   Do not eat food :After Midnight.   After Midnight you may have the following liquids until  0415 AM DAY OF SURGERY  Water Non-Citrus Juices (without pulp, NO RED-Apple, White grape, White cranberry) Black Coffee (NO MILK/CREAM OR CREAMERS, sugar ok)  Clear Tea (NO MILK/CREAM OR CREAMERS, sugar ok) regular and decaf                             Plain Jell-O (NO RED)                                           Fruit ices (not with fruit pulp, NO RED)                                     Popsicles (NO RED)                                                               Sports drinks like Gatorade (NO RED)                   The day of surgery:  Drink ONE (1) Pre-Surgery Clear Ensure or G2 at  0415    ( have completed by)  the morning of surgery. Drink in one sitting. Do not sip.  This drink  was given to you during your hospital  pre-op appointment visit. Nothing else to drink after completing the  Pre-Surgery Clear Ensure or G2.          If you have questions, please contact your surgeon's office.       Oral Hygiene is also important to reduce your risk of infection.  Remember - BRUSH YOUR TEETH THE MORNING OF SURGERY WITH YOUR REGULAR TOOTHPASTE  DENTURES WILL BE REMOVED PRIOR TO SURGERY PLEASE DO NOT APPLY "Poly grip" OR ADHESIVES!!!   Do NOT smoke after Midnight   Stop all vitamins and herbal supplements 7 days before surgery.   Take these medicines the morning of surgery with A SIP OF WATER paxil     Bring CPAP mask and tubing day of surgery.                              You may not have any metal on your body including hair pins, jewelry, and body piercing             Do not wear make-up, lotions, powders, perfumes/cologne, or deodorant  Do not wear nail polish including gel and S&S, artificial/acrylic nails, or any other type of covering on natural nails including finger and toenails. If you have artificial nails, gel coating, etc. that needs to be removed by a nail salon please have this removed prior to surgery or surgery may need to be canceled/ delayed if the surgeon/ anesthesia feels like they are unable to be safely monitored.   Do not shave  48 hours prior to surgery.               Men may shave face and neck.   Do not bring valuables to the hospital. [facility] IS NOT             RESPONSIBLE   FOR VALUABLES.   Contacts, glasses, dentures or bridgework may not be worn into surgery.   Bring small overnight bag day of surgery.   DO NOT BRING YOUR HOME MEDICATIONS TO THE HOSPITAL. PHARMACY WILL DISPENSE MEDICATIONS LISTED ON YOUR MEDICATION LIST TO YOU DURING YOUR ADMISSION IN THE HOSPITAL!    Patients discharged on the day of surgery will not be allowed to drive home.  Someone NEEDS to stay with you for the first  24 hours after anesthesia.   Special Instructions: Bring a copy of your healthcare power of attorney and living will documents the day of surgery if you haven't scanned them before.              Please read over the following fact sheets you were given: IF YOU HAVE QUESTIONS ABOUT YOUR PRE-OP INSTRUCTIONS PLEASE CALL [phone]   If you received a COVID test during your pre-op visit  it is requested that you wear a mask when out in public, stay away from anyone that may not be feeling well and notify your surgeon if you develop symptoms. If you test positive for Covid or have been in contact with anyone that has tested positive in the last 10 days please notify you surgeon.      Pre-operative 5 CHG Bath Instructions   You can play a key role in reducing the risk of infection after surgery. Your skin needs to be as free of germs as possible. You can reduce the number of germs on your skin by washing with CHG (chlorhexidine gluconate) soap before surgery. CHG is an antiseptic soap that kills germs and continues to kill germs even after washing.   DO NOT use if you have an allergy to chlorhexidine/CHG or antibacterial soaps. If your skin becomes reddened or irritated, stop using the CHG and notify one of our RNs at [phone].   Please shower with the CHG soap starting 4  days before surgery using the following schedule:     Please keep in mind the following:  DO NOT shave, including legs and underarms, starting the day of your first shower.   You may shave your face at any point before/day of surgery.  Place clean sheets on your bed the day you start using CHG soap. Use a clean washcloth (not used since being washed) for each shower. DO NOT sleep with pets once you start using the CHG.   CHG Shower Instructions:  If you choose to wash your hair and private area, wash first with your normal shampoo/soap.  After you use shampoo/soap, rinse your hair and body thoroughly to remove  shampoo/soap residue.  Turn the water OFF and apply about 3 tablespoons (45 ml) of CHG soap to a CLEAN washcloth.  Apply CHG soap ONLY FROM YOUR NECK DOWN TO YOUR TOES (washing for 3-5 minutes)  DO NOT use CHG soap on face, private areas, open wounds, or sores.  Pay special attention to the area where your surgery is being performed.  If you are having back surgery, having someone wash your back for you may be helpful. Wait 2 minutes after CHG soap is applied, then you may rinse off the CHG soap.  Pat dry with a clean towel  Put on clean clothes/pajamas   If you choose to wear lotion, please use ONLY the CHG-compatible lotions on the back of this paper.     Additional instructions for the day of surgery: DO NOT APPLY any lotions, deodorants, cologne, or perfumes.   Put on clean/comfortable clothes.  Brush your teeth.  Ask your nurse before applying any prescription medications to the skin.      CHG Compatible Lotions   Aveeno Moisturizing lotion  Cetaphil Moisturizing Cream  Cetaphil Moisturizing Lotion  Clairol Herbal Essence Moisturizing Lotion, Dry Skin  Clairol Herbal Essence Moisturizing Lotion, Extra Dry Skin  Clairol Herbal Essence Moisturizing Lotion, Normal Skin  Curel Age Defying Therapeutic Moisturizing Lotion with Alpha Hydroxy  Curel Extreme Care Body Lotion  Curel Soothing Hands Moisturizing Hand Lotion  Curel Therapeutic Moisturizing Cream, Fragrance-Free  Curel Therapeutic Moisturizing Lotion, Fragrance-Free  Curel Therapeutic Moisturizing Lotion, Original Formula  Eucerin Daily Replenishing Lotion  Eucerin Dry Skin Therapy Plus Alpha Hydroxy Crme  Eucerin Dry Skin Therapy Plus Alpha Hydroxy Lotion  Eucerin Original Crme  Eucerin Original Lotion  Eucerin Plus Crme Eucerin Plus Lotion  Eucerin TriLipid Replenishing Lotion  Keri Anti-Bacterial Hand Lotion  Keri Deep Conditioning Original Lotion Dry Skin Formula Softly Scented  Keri Deep Conditioning  Original Lotion, Fragrance Free Sensitive Skin Formula  Keri Lotion Fast Absorbing Fragrance Free Sensitive Skin Formula  Keri Lotion Fast Absorbing Softly Scented Dry Skin Formula  Keri Original Lotion  Keri Skin Renewal Lotion Keri Silky Smooth Lotion  Keri Silky Smooth Sensitive Skin Lotion  Nivea Body Creamy Conditioning Oil  Nivea Body Extra Enriched Teacher, adult education Moisturizing Lotion Nivea Crme  Nivea Skin Firming Lotion  NutraDerm 30 Skin Lotion  NutraDerm Skin Lotion  NutraDerm Therapeutic Skin Cream  NutraDerm Therapeutic Skin Lotion  ProShield Protective Hand Cream  Provon moisturizing lotion

## 2023-03-11 NOTE — Progress Notes (Signed)
Anesthesia Review:  PCP: Crawford Givens LVO 02/11/2023  Cardiologist : Chest x-ray : 06/29/22- 2 view  EKG : 02/11/2023  Echo : 10/16/20  Stress test: Cardiac Cath :  Activity level:  Sleep Study/ CPAP : Fasting Blood Sugar :      / Checks Blood Sugar -- times a day:   Blood Thinner/ Instructions /Last Dose: ASA / Instructions/ Last Dose :

## 2023-03-12 ENCOUNTER — Encounter (HOSPITAL_COMMUNITY)
Admission: RE | Admit: 2023-03-12 | Discharge: 2023-03-12 | Disposition: A | Discharge status: Home / Self Care | Payer: Medicare Other | Source: Ambulatory Visit | Attending: Orthopedic Surgery | Admitting: Orthopedic Surgery

## 2023-03-12 ENCOUNTER — Encounter (HOSPITAL_COMMUNITY): Payer: Self-pay

## 2023-03-12 ENCOUNTER — Other Ambulatory Visit: Payer: Self-pay

## 2023-03-12 VITALS — BP 125/79 | HR 84 | Temp 98.8°F | Ht 63.0 in | Wt 144.0 lb

## 2023-03-12 DIAGNOSIS — Z01812 Encounter for preprocedural laboratory examination: Secondary | ICD-10-CM | POA: Diagnosis not present

## 2023-03-12 DIAGNOSIS — Z01818 Encounter for other preprocedural examination: Secondary | ICD-10-CM | POA: Diagnosis present

## 2023-03-12 LAB — CBC
HCT: 44 % (ref 36.0–46.0)
Hemoglobin: 14.2 g/dL (ref 12.0–15.0)
MCH: 30.5 pg (ref 26.0–34.0)
MCHC: 32.3 g/dL (ref 30.0–36.0)
MCV: 94.6 fL (ref 80.0–100.0)
Platelets: 199 10*3/uL (ref 150–400)
RBC: 4.65 MIL/uL (ref 3.87–5.11)
RDW: 13.4 % (ref 11.5–15.5)
WBC: 5.5 10*3/uL (ref 4.0–10.5)
nRBC: 0 % (ref 0.0–0.2)

## 2023-03-12 LAB — SURGICAL PCR SCREEN
MRSA, PCR: NEGATIVE
Staphylococcus aureus: NEGATIVE

## 2023-03-12 LAB — TYPE AND SCREEN
ABO/RH(D): A NEG
Antibody Screen: NEGATIVE

## 2023-03-18 DIAGNOSIS — Z96649 Presence of unspecified artificial hip joint: Secondary | ICD-10-CM

## 2023-03-18 NOTE — H&P (Signed)
TOTAL HIP ADMISSION H&P  Patient is admitted for left total hip arthroplasty.  Subjective:  Chief Complaint: left hip pain  HPI: [name] Price, 83 y.o. female, has a history of pain and functional disability in the left hip(s) due to  instability  and patient has failed non-surgical conservative treatments for greater than 12 weeks to include NSAID's and/or analgesics, supervised PT with diminished ADL's post treatment, use of assistive devices, and activity modification.  Onset of symptoms was abrupt starting 1 years ago with rapidlly worsening course since that time.The patient noted prior procedures of the hip to include arthroplasty on the left hip(s).  Patient currently rates pain in the left hip at 10 out of 10 with activity. Patient has  instability . Patient has evidence of  malalignment of cup  by imaging studies. This condition presents safety issues increasing the risk of falls.  There is no current active infection.  Patient Active Problem List   Diagnosis Date Noted   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   Hip pain 02/12/2023   Cellulitis 12/17/2022   Eye swelling, bilateral 11/21/2022   Closed dislocation of left hip (HCC) 11/21/2022   Lung field abnormal finding on examination 06/29/2022   Dermatitis 02/20/2022   Gastroesophageal reflux disease    Depression    Wound of right foot 10/29/2020   Anemia 10/29/2020   HTN (hypertension) 10/29/2020   Rash 09/05/2020   History of hip replacement    Advance care planning 06/25/2017   Health care maintenance 06/25/2017   Hot flashes 06/25/2017   Acute cough 02/26/2017   BPV (benign positional vertigo) 03/15/2015   Ganglion cyst of wrist 02/23/2015   Extensor tendon rupture of hand 02/01/2015   Left wrist effusion 01/26/2015   KNEE PAIN, RIGHT 11/13/2007   CHONDROMALACIA PATELLA, LEFT 03/19/2007   Osteoporosis 03/19/2007   HEMATURIA, HX OF 03/19/2007   DIVERTICULOSIS, COLON 08/10/2002   HYPERCHOLESTEROLEMIA  06/05/1996   Past Medical History:  Diagnosis Date   Allergy    Anxiety    Arthritis    Blood transfusion without reported diagnosis    as baby   Breast cancer (HCC) 1999-2000   s/p lumpectomy and radiation, no chemo-right   History of chicken pox    History of hip replacement    Hot flashes    treated wtih paxil   Hyperlipidemia    Osteoporosis    DXA 11/12/17   UTI (urinary tract infection)     Past Surgical History:  Procedure Laterality Date   APPLICATION OF WOUND VAC Right 11/08/2020   Procedure: APPLICATION OF WOUND VAC;  Surgeon: Allena Napoleon, MD;  Location: [facility];  Service: Plastics;  Laterality: Right;   BREAST SURGERY Right    CATARACT EXTRACTION W/PHACO Left 12/28/2014   Procedure: CATARACT EXTRACTION PHACO AND INTRAOCULAR LENS PLACEMENT (IOC);  Surgeon: Galen Manila, MD;  Location: [facility];  Service: Ophthalmology;  Laterality: Left;  [place] 00:38 AP% 19.9 CDE 7.70 fluid pack lot # [id number]   CATARACT EXTRACTION W/PHACO Right 07/10/2016   Procedure: CATARACT EXTRACTION PHACO AND INTRAOCULAR LENS PLACEMENT (IOC);  Surgeon: Galen Manila, MD;  Location: [facility];  Service: Ophthalmology;  Laterality: Right;  [place] 00:38 AP% 18.3 CDE 7.06 Fluid pack lot # [id number]   COLONOSCOPY  2016   DEBRIDEMENT AND CLOSURE WOUND Right 11/01/2020   Procedure: DEBRIDEMENT RIGHT FOOT WOUND;  Surgeon: Allena Napoleon, MD;  Location: [facility];  Service: Plastics;  Laterality: Right;  60 minutes   DEBRIDEMENT  AND CLOSURE WOUND Right 11/08/2020   Procedure: Debridement of r. foot wound;  Surgeon: Allena Napoleon, MD;  Location: [facility];  Service: Plastics;  Laterality: Right;   EYE SURGERY     JOINT REPLACEMENT Bilateral    hip  / shoulder   OOPHORECTOMY Left    POLYPECTOMY     SKIN SPLIT GRAFT Right 01/13/2021   Procedure: SKIN GRAFT SPLIT THICKNESS;  Surgeon: Allena Napoleon, MD;  Location: [facility];  Service: Plastics;  Laterality: Right;   TOTAL HIP  ARTHROPLASTY Bilateral    (2) Two   TOTAL SHOULDER REPLACEMENT Left     No current facility-administered medications for this encounter.   Current Outpatient Medications  Medication Sig Dispense Refill Last Dose/Taking   cetirizine (ZYRTEC) 10 MG tablet Take 10 mg by mouth daily as needed for allergies.   Taking As Needed   naproxen sodium (ALEVE) 220 MG tablet Take 220 mg by mouth 2 (two) times daily.   Taking   PARoxetine (PAXIL-CR) 25 MG 24 hr tablet TAKE 1 TABLET BY MOUTH EVERY DAY IN THE MORNING 90 tablet 3 Taking   ascorbic acid (VITAMIN C) 500 MG tablet Take 500 mg by mouth daily.      B Complex Vitamins (VITAMIN B COMPLEX) TABS Take 1 tablet by mouth every morning.      Calcium Citrate-Vitamin D (CALCIUM + D PO) Take 1 tablet by mouth daily.      cyanocobalamin (VITAMIN B12) 1000 MCG tablet Take 1,000 mcg by mouth daily.      Allergies  Allergen Reactions   Codeine Nausea And Vomiting   Fosamax [Alendronate]     GI intolerance and "didn't feel well on medicine."      Social History   Tobacco Use   Smoking status: Never   Smokeless tobacco: Never  Substance Use Topics   Alcohol use: No    Comment: rare once a year    Family History  Problem Relation Age of Onset   Lung cancer Mother    Heart disease Mother    Colon cancer Mother        late 87's   Cancer Father    Colon cancer Father 43       died at 69   Breast cancer Maternal Aunt    Breast cancer Maternal Aunt    Breast cancer Maternal Aunt    Rectal cancer Neg Hx    Stomach cancer Neg Hx    Crohn's disease Neg Hx    Esophageal cancer Neg Hx      Review of Systems  Constitutional: Negative.   HENT: Negative.    Eyes: Negative.   Respiratory: Negative.    Cardiovascular: Negative.   Gastrointestinal: Negative.   Endocrine: Negative.   Genitourinary: Negative.   Musculoskeletal:  Positive for arthralgias.  Skin: Negative.   Allergic/Immunologic: Negative.   Neurological: Negative.   Hematological:  Negative.   Psychiatric/Behavioral: Negative.      Objective:  Physical Exam Constitutional:      Appearance: Normal appearance. She is normal weight.  HENT:     Head: Normocephalic and atraumatic.     Nose: Nose normal.  Eyes:     Pupils: Pupils are equal, round, and reactive to light.  Cardiovascular:     Pulses: Normal pulses.  Pulmonary:     Effort: Pulmonary effort is normal.  Musculoskeletal:     Cervical back: Normal range of motion and neck supple.     Comments:  Surgical scar to the left hip is well-healed at the standard posterior lateral approach.  In the seated position she can internally rotate to 5 external rotate to 40 without any difficulty.  Foot tap is negative.  I did not push her on the internal rotation.  Good power to testing of hip flexors extensors abductors and abductors.  Skin:    General: Skin is warm and dry.  Neurological:     General: No focal deficit present.     Mental Status: She is alert and oriented to person, place, and time. Mental status is at baseline.  Psychiatric:        Mood and Affect: Mood normal.        Behavior: Behavior normal.        Thought Content: Thought content normal.        Judgment: Judgment normal.     Vital signs in last 24 hours:    Labs:   Estimated body mass index is 25.51 kg/m as calculated from the following:   Height as of 03/12/23: 5\' 3"  (1.6 m).   Weight as of 03/12/23: 65.3 kg.   Imaging Review Plain radiographs demonstrate AP pelvis and crosstable lateral of the left hip shows that the acetabular component is in 50 of abduction and is retroverted 5.  The contralateral hip shows a Secur-Fit HA stem and acetabular component with anteversion of 20.   Assessment/Plan:  End stage arthritis, left hip(s)  The patient history, physical examination, clinical judgement of the provider and imaging studies are consistent with end stage degenerative joint disease of the left hip(s) and total hip arthroplasty  is deemed medically necessary. The treatment options including medical management, injection therapy, arthroscopy and arthroplasty were discussed at length. The risks and benefits of total hip arthroplasty were presented and reviewed. The risks due to aseptic loosening, infection, stiffness, dislocation/subluxation,  thromboembolic complications and other imponderables were discussed.  The patient acknowledged the explanation, agreed to proceed with the plan and consent was signed. Patient is being admitted for inpatient treatment for surgery, pain control, PT, OT, prophylactic antibiotics, VTE prophylaxis, progressive ambulation and ADL's and discharge planning.The patient is planning to be discharged home with home health services    Patient's anticipated LOS is less than 2 midnights, meeting these requirements: - Younger than 26 - Lives within 1 hour of care - Has a competent adult at home to recover with post-op recover - NO history of  - Chronic pain requiring opiods  - Diabetes  - Coronary Artery Disease  - Heart failure  - Heart attack  - Stroke  - DVT/VTE  - Cardiac arrhythmia  - Respiratory Failure/COPD  - Renal failure  - Anemia  - Advanced Liver disease

## 2023-03-23 NOTE — Anesthesia Preprocedure Evaluation (Addendum)
 Anesthesia Evaluation  Patient identified by MRN, date of birth, ID band Patient awake    Reviewed: Allergy & Precautions, NPO status , Patient's Chart, lab work & pertinent test results  History of Anesthesia Complications Negative for: history of anesthetic complications  Airway Mallampati: II  TM Distance: >3 FB Neck ROM: Full    Dental  (+) Edentulous Upper, Partial Lower, Dental Advisory Given   Pulmonary neg pulmonary ROS, neg recent URI   Pulmonary exam normal breath sounds clear to auscultation       Cardiovascular hypertension, Pt. on medications and Pt. on home beta blockers (-) angina Normal cardiovascular exam+ Valvular Problems/Murmurs (Mild-mod TR) MR  Rhythm:Regular Rate:Normal  Echo 10/16/2020  1. Left ventricular ejection fraction, by estimation, is 60 to 65%. The left ventricle has normal function. The left ventricle has no regional wall motion abnormalities. Left ventricular diastolic parameters were normal.   2. Right ventricular systolic function is normal. The right ventricular size is normal. There is normal pulmonary artery systolic pressure.   3. The mitral valve is normal in structure. Mild mitral valve regurgitation. No evidence of mitral stenosis.   4. Tricuspid valve regurgitation is mild to moderate.   5. The aortic valve is normal in structure. Aortic valve regurgitation is trivial. No aortic stenosis is present.   6. The inferior vena cava is normal in size with greater than 50% respiratory variability, suggesting right atrial pressure of 3 mmHg.      Neuro/Psych  PSYCHIATRIC DISORDERS Anxiety Depression    negative neurological ROS     GI/Hepatic Neg liver ROS,GERD  Controlled,,  Endo/Other  negative endocrine ROS    Renal/GU negative Renal ROS     Musculoskeletal  (+) Arthritis , Osteoarthritis,    Abdominal   Peds  Hematology  (+) Blood dyscrasia (Hb 9.6), anemia   Anesthesia Other  Findings H/o breast cancer  Reproductive/Obstetrics                              Anesthesia Physical Anesthesia Plan  ASA: 3  Anesthesia Plan: General   Post-op Pain Management: Tylenol  PO (pre-op)*   Induction: Intravenous  PONV Risk Score and Plan: 3 and Ondansetron , Dexamethasone , Treatment may vary due to age or medical condition and Midazolam   Airway Management Planned: Oral ETT  Additional Equipment: None  Intra-op Plan:   Post-operative Plan: Extubation in OR  Informed Consent: I have reviewed the patients History and Physical, chart, labs and discussed the procedure including the risks, benefits and alternatives for the proposed anesthesia with the patient or authorized representative who has indicated his/her understanding and acceptance.     Dental advisory given  Plan Discussed with: CRNA  Anesthesia Plan Comments:          Anesthesia Quick Evaluation

## 2023-03-24 MED ORDER — TRANEXAMIC ACID 1000 MG/10ML IV SOLN
2000.0000 mg | INTRAVENOUS | Status: DC
  Filled 2023-03-24: qty 20

## 2023-03-25 ENCOUNTER — Encounter (HOSPITAL_COMMUNITY)
Admission: RE | Disposition: A | Discharge status: Home / Self Care | Payer: Self-pay | Source: Home / Self Care | Attending: Orthopedic Surgery

## 2023-03-25 ENCOUNTER — Inpatient Hospital Stay (HOSPITAL_COMMUNITY): Payer: Self-pay | Admitting: Anesthesiology

## 2023-03-25 ENCOUNTER — Inpatient Hospital Stay (HOSPITAL_COMMUNITY)
Admission: RE | Admit: 2023-03-25 | Discharge: 2023-03-25 | DRG: 468 | Disposition: A | Discharge status: Home / Self Care | Payer: Medicare Other | Attending: Orthopedic Surgery | Admitting: Orthopedic Surgery

## 2023-03-25 ENCOUNTER — Other Ambulatory Visit: Payer: Self-pay

## 2023-03-25 ENCOUNTER — Encounter (HOSPITAL_COMMUNITY): Payer: Self-pay | Admitting: Orthopedic Surgery

## 2023-03-25 ENCOUNTER — Inpatient Hospital Stay (HOSPITAL_COMMUNITY): Payer: Medicare Other

## 2023-03-25 DIAGNOSIS — S73005A Unspecified dislocation of left hip, initial encounter: Secondary | ICD-10-CM | POA: Diagnosis present

## 2023-03-25 DIAGNOSIS — Z7982 Long term (current) use of aspirin: Secondary | ICD-10-CM | POA: Diagnosis not present

## 2023-03-25 DIAGNOSIS — Z96612 Presence of left artificial shoulder joint: Secondary | ICD-10-CM | POA: Diagnosis present

## 2023-03-25 DIAGNOSIS — T84021A Dislocation of internal left hip prosthesis, initial encounter: Principal | ICD-10-CM | POA: Diagnosis present

## 2023-03-25 DIAGNOSIS — Z01818 Encounter for other preprocedural examination: Secondary | ICD-10-CM

## 2023-03-25 DIAGNOSIS — I10 Essential (primary) hypertension: Secondary | ICD-10-CM | POA: Diagnosis present

## 2023-03-25 DIAGNOSIS — Z96641 Presence of right artificial hip joint: Secondary | ICD-10-CM | POA: Diagnosis present

## 2023-03-25 DIAGNOSIS — K219 Gastro-esophageal reflux disease without esophagitis: Secondary | ICD-10-CM | POA: Diagnosis present

## 2023-03-25 DIAGNOSIS — E7800 Pure hypercholesterolemia, unspecified: Secondary | ICD-10-CM | POA: Diagnosis present

## 2023-03-25 DIAGNOSIS — F418 Other specified anxiety disorders: Secondary | ICD-10-CM

## 2023-03-25 DIAGNOSIS — Z923 Personal history of irradiation: Secondary | ICD-10-CM

## 2023-03-25 DIAGNOSIS — Z885 Allergy status to narcotic agent status: Secondary | ICD-10-CM

## 2023-03-25 DIAGNOSIS — F32A Depression, unspecified: Secondary | ICD-10-CM | POA: Diagnosis present

## 2023-03-25 DIAGNOSIS — Z888 Allergy status to other drugs, medicaments and biological substances status: Secondary | ICD-10-CM | POA: Diagnosis not present

## 2023-03-25 DIAGNOSIS — S73002D Unspecified subluxation of left hip, subsequent encounter: Secondary | ICD-10-CM

## 2023-03-25 DIAGNOSIS — Z79899 Other long term (current) drug therapy: Secondary | ICD-10-CM

## 2023-03-25 DIAGNOSIS — Y792 Prosthetic and other implants, materials and accessory orthopedic devices associated with adverse incidents: Secondary | ICD-10-CM | POA: Diagnosis present

## 2023-03-25 DIAGNOSIS — I2510 Atherosclerotic heart disease of native coronary artery without angina pectoris: Secondary | ICD-10-CM | POA: Diagnosis present

## 2023-03-25 DIAGNOSIS — Z853 Personal history of malignant neoplasm of breast: Secondary | ICD-10-CM

## 2023-03-25 DIAGNOSIS — M810 Age-related osteoporosis without current pathological fracture: Secondary | ICD-10-CM | POA: Diagnosis present

## 2023-03-25 DIAGNOSIS — T84018A Broken internal joint prosthesis, other site, initial encounter: Secondary | ICD-10-CM

## 2023-03-25 DIAGNOSIS — Z96642 Presence of left artificial hip joint: Secondary | ICD-10-CM | POA: Diagnosis not present

## 2023-03-25 DIAGNOSIS — Z801 Family history of malignant neoplasm of trachea, bronchus and lung: Secondary | ICD-10-CM | POA: Diagnosis not present

## 2023-03-25 DIAGNOSIS — Z8249 Family history of ischemic heart disease and other diseases of the circulatory system: Secondary | ICD-10-CM

## 2023-03-25 DIAGNOSIS — Z803 Family history of malignant neoplasm of breast: Secondary | ICD-10-CM | POA: Diagnosis not present

## 2023-03-25 DIAGNOSIS — Z96649 Presence of unspecified artificial hip joint: Secondary | ICD-10-CM

## 2023-03-25 DIAGNOSIS — Z800 Family history of malignant neoplasm of digestive organs: Secondary | ICD-10-CM | POA: Diagnosis not present

## 2023-03-25 DIAGNOSIS — Z96643 Presence of artificial hip joint, bilateral: Secondary | ICD-10-CM | POA: Diagnosis not present

## 2023-03-25 HISTORY — PX: TOTAL HIP REVISION: SHX763

## 2023-03-25 SURGERY — TOTAL HIP REVISION
Anesthesia: General | Site: Hip | Laterality: Left

## 2023-03-25 MED ORDER — ONDANSETRON HCL 4 MG/2ML IJ SOLN
INTRAMUSCULAR | Status: AC
  Filled 2023-03-25: qty 2

## 2023-03-25 MED ORDER — TIZANIDINE HCL 2 MG PO TABS: 2.0000 mg | ORAL_TABLET | Freq: Four times a day (QID) | ORAL | 0 refills | Status: DC | PRN

## 2023-03-25 MED ORDER — BUPIVACAINE LIPOSOME 1.3 % IJ SUSP
INTRAMUSCULAR | Status: DC | PRN
  Administered 2023-03-25: 20 mL

## 2023-03-25 MED ORDER — ASPIRIN 81 MG PO TBEC: 81.0000 mg | DELAYED_RELEASE_TABLET | Freq: Two times a day (BID) | ORAL | 0 refills | Status: AC

## 2023-03-25 MED ORDER — LIDOCAINE HCL (PF) 2 % IJ SOLN
INTRAMUSCULAR | Status: AC
  Filled 2023-03-25: qty 5

## 2023-03-25 MED ORDER — FENTANYL CITRATE (PF) 100 MCG/2ML IJ SOLN
INTRAMUSCULAR | Status: AC
  Filled 2023-03-25: qty 2

## 2023-03-25 MED ORDER — ALBUMIN HUMAN 5 % IV SOLN: INTRAVENOUS | Status: DC | PRN

## 2023-03-25 MED ORDER — 0.9 % SODIUM CHLORIDE (POUR BTL) OPTIME
TOPICAL | Status: DC | PRN
  Administered 2023-03-25: 1000 mL

## 2023-03-25 MED ORDER — TRANEXAMIC ACID-NACL 1000-0.7 MG/100ML-% IV SOLN
1000.0000 mg | INTRAVENOUS | Status: AC
  Administered 2023-03-25: 1000 mg via INTRAVENOUS
  Filled 2023-03-25: qty 100

## 2023-03-25 MED ORDER — PHENYLEPHRINE 80 MCG/ML (10ML) SYRINGE FOR IV PUSH (FOR BLOOD PRESSURE SUPPORT)
PREFILLED_SYRINGE | INTRAVENOUS | Status: AC
Start: 2023-03-25 — End: ?
  Filled 2023-03-25: qty 10

## 2023-03-25 MED ORDER — LACTATED RINGERS IV SOLN: INTRAVENOUS | Status: DC

## 2023-03-25 MED ORDER — POVIDONE-IODINE 10 % EX SWAB: 2.0000 | Freq: Once | CUTANEOUS | Status: DC

## 2023-03-25 MED ORDER — SUGAMMADEX SODIUM 200 MG/2ML IV SOLN
INTRAVENOUS | Status: DC | PRN
  Administered 2023-03-25: 150 mg via INTRAVENOUS

## 2023-03-25 MED ORDER — LACTATED RINGERS IV BOLUS
250.0000 mL | Freq: Once | INTRAVENOUS | Status: AC
  Administered 2023-03-25: 250 mL via INTRAVENOUS

## 2023-03-25 MED ORDER — ORAL CARE MOUTH RINSE: 15.0000 mL | Freq: Once | OROMUCOSAL | Status: AC

## 2023-03-25 MED ORDER — ALBUMIN HUMAN 5 % IV SOLN
INTRAVENOUS | Status: AC
  Filled 2023-03-25: qty 250

## 2023-03-25 MED ORDER — DROPERIDOL 2.5 MG/ML IJ SOLN: 0.6250 mg | Freq: Once | INTRAMUSCULAR | Status: DC | PRN

## 2023-03-25 MED ORDER — HYDROMORPHONE HCL 1 MG/ML IJ SOLN
INTRAMUSCULAR | Status: AC
  Administered 2023-03-25: 0.25 mg via INTRAVENOUS
  Filled 2023-03-25: qty 1

## 2023-03-25 MED ORDER — BUPIVACAINE-EPINEPHRINE 0.25% -1:200000 IJ SOLN
INTRAMUSCULAR | Status: DC | PRN
  Administered 2023-03-25: 30 mL

## 2023-03-25 MED ORDER — PROPOFOL 10 MG/ML IV BOLUS
INTRAVENOUS | Status: AC
  Filled 2023-03-25: qty 20

## 2023-03-25 MED ORDER — SODIUM CHLORIDE (PF) 0.9 % IJ SOLN
INTRAMUSCULAR | Status: AC
  Filled 2023-03-25: qty 10

## 2023-03-25 MED ORDER — HYDROMORPHONE HCL 1 MG/ML IJ SOLN
INTRAMUSCULAR | Status: AC
  Filled 2023-03-25: qty 1

## 2023-03-25 MED ORDER — STERILE WATER FOR IRRIGATION IR SOLN
Status: DC | PRN
  Administered 2023-03-25: 1000 mL

## 2023-03-25 MED ORDER — ONDANSETRON HCL 4 MG/2ML IJ SOLN
INTRAMUSCULAR | Status: DC | PRN
  Administered 2023-03-25: 4 mg via INTRAVENOUS

## 2023-03-25 MED ORDER — BUPIVACAINE-EPINEPHRINE 0.25% -1:200000 IJ SOLN
INTRAMUSCULAR | Status: AC
  Filled 2023-03-25: qty 1

## 2023-03-25 MED ORDER — OXYCODONE HCL 5 MG PO TABS
ORAL_TABLET | ORAL | Status: AC
  Filled 2023-03-25: qty 1

## 2023-03-25 MED ORDER — OXYCODONE HCL 5 MG PO TABS
5.0000 mg | ORAL_TABLET | Freq: Once | ORAL | Status: AC
  Administered 2023-03-25: 5 mg via ORAL

## 2023-03-25 MED ORDER — FENTANYL CITRATE (PF) 100 MCG/2ML IJ SOLN
INTRAMUSCULAR | Status: DC | PRN
  Administered 2023-03-25: 25 ug via INTRAVENOUS
  Administered 2023-03-25: 50 ug via INTRAVENOUS
  Administered 2023-03-25: 25 ug via INTRAVENOUS
  Administered 2023-03-25 (×2): 50 ug via INTRAVENOUS

## 2023-03-25 MED ORDER — CHLORHEXIDINE GLUCONATE 0.12 % MT SOLN
15.0000 mL | Freq: Once | OROMUCOSAL | Status: AC
  Administered 2023-03-25: 15 mL via OROMUCOSAL

## 2023-03-25 MED ORDER — EPHEDRINE SULFATE-NACL 50-0.9 MG/10ML-% IV SOSY
PREFILLED_SYRINGE | INTRAVENOUS | Status: DC | PRN
  Administered 2023-03-25: 5 mg via INTRAVENOUS
  Administered 2023-03-25: 10 mg via INTRAVENOUS
  Administered 2023-03-25: 5 mg via INTRAVENOUS

## 2023-03-25 MED ORDER — LACTATED RINGERS IV BOLUS
500.0000 mL | Freq: Once | INTRAVENOUS | Status: AC
  Administered 2023-03-25: 500 mL via INTRAVENOUS

## 2023-03-25 MED ORDER — DEXAMETHASONE SODIUM PHOSPHATE 10 MG/ML IJ SOLN
INTRAMUSCULAR | Status: AC
  Filled 2023-03-25: qty 1

## 2023-03-25 MED ORDER — SODIUM CHLORIDE 0.9% FLUSH
INTRAVENOUS | Status: DC | PRN
  Administered 2023-03-25: 30 mL

## 2023-03-25 MED ORDER — ACETAMINOPHEN 500 MG PO TABS
1000.0000 mg | ORAL_TABLET | Freq: Once | ORAL | Status: AC
  Administered 2023-03-25: 1000 mg via ORAL
  Filled 2023-03-25: qty 2

## 2023-03-25 MED ORDER — LIDOCAINE HCL (CARDIAC) PF 100 MG/5ML IV SOSY
PREFILLED_SYRINGE | INTRAVENOUS | Status: DC | PRN
  Administered 2023-03-25: 60 mg via INTRAVENOUS

## 2023-03-25 MED ORDER — DEXAMETHASONE SODIUM PHOSPHATE 4 MG/ML IJ SOLN
INTRAMUSCULAR | Status: DC | PRN
  Administered 2023-03-25: 6 mg via INTRAVENOUS

## 2023-03-25 MED ORDER — BUPIVACAINE LIPOSOME 1.3 % IJ SUSP
INTRAMUSCULAR | Status: AC
  Filled 2023-03-25: qty 10

## 2023-03-25 MED ORDER — HYDROMORPHONE HCL 1 MG/ML IJ SOLN: 0.2500 mg | INTRAMUSCULAR | Status: DC | PRN

## 2023-03-25 MED ORDER — PHENYLEPHRINE 80 MCG/ML (10ML) SYRINGE FOR IV PUSH (FOR BLOOD PRESSURE SUPPORT)
PREFILLED_SYRINGE | INTRAVENOUS | Status: DC | PRN
  Administered 2023-03-25: 80 ug via INTRAVENOUS

## 2023-03-25 MED ORDER — EPHEDRINE SULFATE (PRESSORS) 50 MG/ML IJ SOLN: INTRAMUSCULAR | Status: DC | PRN

## 2023-03-25 MED ORDER — HYDROMORPHONE HCL 2 MG PO TABS: 2.0000 mg | ORAL_TABLET | ORAL | 0 refills | Status: DC | PRN

## 2023-03-25 MED ORDER — MIDAZOLAM HCL 2 MG/2ML IJ SOLN
INTRAMUSCULAR | Status: AC
  Filled 2023-03-25: qty 2

## 2023-03-25 MED ORDER — BUPIVACAINE LIPOSOME 1.3 % IJ SUSP: 10.0000 mL | Freq: Once | INTRAMUSCULAR | Status: DC

## 2023-03-25 MED ORDER — ROCURONIUM BROMIDE 10 MG/ML (PF) SYRINGE
PREFILLED_SYRINGE | INTRAVENOUS | Status: AC
  Filled 2023-03-25: qty 10

## 2023-03-25 MED ORDER — EPHEDRINE 5 MG/ML INJ
INTRAVENOUS | Status: AC
  Filled 2023-03-25: qty 5

## 2023-03-25 MED ORDER — ROCURONIUM BROMIDE 100 MG/10ML IV SOLN
INTRAVENOUS | Status: DC | PRN
  Administered 2023-03-25: 20 mg via INTRAVENOUS
  Administered 2023-03-25: 50 mg via INTRAVENOUS

## 2023-03-25 MED ORDER — MIDAZOLAM HCL 5 MG/5ML IJ SOLN
INTRAMUSCULAR | Status: DC | PRN
Start: 2023-03-25 — End: 2023-03-25
  Administered 2023-03-25: .5 mg via INTRAVENOUS

## 2023-03-25 MED ORDER — CEFAZOLIN SODIUM-DEXTROSE 2-4 GM/100ML-% IV SOLN
2.0000 g | INTRAVENOUS | Status: AC
  Administered 2023-03-25: 2 g via INTRAVENOUS
  Filled 2023-03-25: qty 100

## 2023-03-25 MED ORDER — TRANEXAMIC ACID 1000 MG/10ML IV SOLN
INTRAVENOUS | Status: DC | PRN
  Administered 2023-03-25: 2000 mg via TOPICAL

## 2023-03-25 MED ORDER — PROPOFOL 10 MG/ML IV BOLUS
INTRAVENOUS | Status: DC | PRN
  Administered 2023-03-25: 20 mg via INTRAVENOUS
  Administered 2023-03-25: 100 mg via INTRAVENOUS

## 2023-03-25 SURGICAL SUPPLY — 58 items
BAG COUNTER SPONGE SURGICOUNT (BAG) IMPLANT
BAG DECANTER FOR FLEXI CONT (MISCELLANEOUS) ×1 IMPLANT
BLADE SAW SGTL 73X25 THK (BLADE) IMPLANT
CHLORAPREP W/TINT 26 (MISCELLANEOUS) ×1 IMPLANT
CNTNR URN SCR LID CUP LEK RST (MISCELLANEOUS) IMPLANT
COVER SURGICAL LIGHT HANDLE (MISCELLANEOUS) ×1 IMPLANT
CUP ACET TRID II E 54 MH (Cup) IMPLANT
DRAPE C-ARM 42X120 X-RAY (DRAPES) IMPLANT
DRAPE C-ARMOR (DRAPES) IMPLANT
DRAPE SHEET LG 3/4 BI-LAMINATE (DRAPES) ×1 IMPLANT
DRAPE SURG ORHT 6 SPLT 77X108 (DRAPES) ×2 IMPLANT
DRAPE U-SHAPE 47X51 STRL (DRAPES) ×1 IMPLANT
DRSG AQUACEL AG ADV 3.5X10 (GAUZE/BANDAGES/DRESSINGS) ×1 IMPLANT
ELECT BLADE TIP CTD 4 INCH (ELECTRODE) ×1 IMPLANT
ELECT REM PT RETURN 15FT ADLT (MISCELLANEOUS) ×1 IMPLANT
GAUZE SPONGE 4X4 12PLY STRL (GAUZE/BANDAGES/DRESSINGS) IMPLANT
GAUZE XEROFORM 5X9 LF (GAUZE/BANDAGES/DRESSINGS) IMPLANT
GLOVE BIO SURGEON STRL SZ7.5 (GLOVE) ×1 IMPLANT
GLOVE BIO SURGEON STRL SZ8.5 (GLOVE) ×1 IMPLANT
GLOVE BIOGEL PI IND STRL 8 (GLOVE) ×1 IMPLANT
GLOVE BIOGEL PI IND STRL 9 (GLOVE) ×1 IMPLANT
GOWN STRL SURGICAL XL XLNG (GOWN DISPOSABLE) ×2 IMPLANT
HEAD OSTEO CTAPER L FIT 28 (Orthopedic Implant) IMPLANT
HOLDER FOLEY CATH W/STRAP (MISCELLANEOUS) ×1 IMPLANT
HOOD PEEL AWAY T7 (MISCELLANEOUS) ×4 IMPLANT
IMMOBILIZER KNEE 20 (SOFTGOODS)
IMMOBILIZER KNEE 20 THIGH 36 (SOFTGOODS) IMPLANT
IV NS IRRIG 3000ML ARTHROMATIC (IV SOLUTION) ×1 IMPLANT
KIT BASIN OR (CUSTOM PROCEDURE TRAY) ×1 IMPLANT
KIT TURNOVER KIT A (KITS) IMPLANT
LINER 42MM E (Orthopedic Implant) IMPLANT
LINER ADM MDM INS 28/48 42E (Liner) IMPLANT
NDL HYPO 21X1.5 SAFETY (NEEDLE) ×1 IMPLANT
NDL MAYO CATGUT SZ4 TPR NDL (NEEDLE) IMPLANT
NDL SAFETY ECLIPSE 18X1.5 (NEEDLE) ×1 IMPLANT
NEEDLE HYPO 21X1.5 SAFETY (NEEDLE) ×1
NEEDLE MAYO CATGUT SZ4 (NEEDLE)
NS IRRIG 1000ML POUR BTL (IV SOLUTION) ×1 IMPLANT
PACK TOTAL JOINT (CUSTOM PROCEDURE TRAY) ×1 IMPLANT
PASSER SUT SWANSON 36MM LOOP (INSTRUMENTS) IMPLANT
PROTECTOR NERVE ULNAR (MISCELLANEOUS) ×1 IMPLANT
SCREW HEX LP 6.5X15 (Screw) IMPLANT
SCREW HEX LP 6.5X20 (Screw) IMPLANT
SCREW HEX LP 6.5X30 (Screw) IMPLANT
SCREW HEX LP 6.5X55 (Screw) IMPLANT
SPIKE FLUID TRANSFER (MISCELLANEOUS) ×2 IMPLANT
SUT ETHIBOND 2 V 37 (SUTURE) ×3 IMPLANT
SUT VIC AB 0 CT1 36 (SUTURE) ×1 IMPLANT
SUT VIC AB 1 CTX36XBRD ANBCTR (SUTURE) ×1 IMPLANT
SUT VICRYL+ 3-0 36IN CT-1 (SUTURE) ×1 IMPLANT
SWAB COLLECTION DEVICE MRSA (MISCELLANEOUS) IMPLANT
SWAB CULTURE ESWAB REG 1ML (MISCELLANEOUS) IMPLANT
SYR CONTROL 10ML LL (SYRINGE) ×2 IMPLANT
TOWEL OR 17X26 10 PK STRL BLUE (TOWEL DISPOSABLE) ×1 IMPLANT
TOWER CARTRIDGE SMART MIX (DISPOSABLE) IMPLANT
TRAY FOLEY MTR SLVR 14FR STAT (SET/KITS/TRAYS/PACK) ×1 IMPLANT
TUBE SUCTION HIGH CAP CLEAR NV (SUCTIONS) ×1 IMPLANT
WATER STERILE IRR 1000ML POUR (IV SOLUTION) ×2 IMPLANT

## 2023-03-25 NOTE — Anesthesia Procedure Notes (Signed)
 Procedure Name: Intubation Date/Time: 03/25/2023 7:19 AM  Performed by: Mervyn Ace, CRNAPre-anesthesia Checklist: Patient identified, Emergency Drugs available, Suction available, Patient being monitored and Timeout performed Patient Re-evaluated:Patient Re-evaluated prior to induction Oxygen Delivery Method: Circle system utilized Preoxygenation: Pre-oxygenation with 100% oxygen Induction Type: IV induction Ventilation: Mask ventilation without difficulty Laryngoscope Size: Mac and 3 Grade View: Grade I Tube type: Oral Tube size: 6.5 mm Number of attempts: 1 Airway Equipment and Method: Stylet Placement Confirmation: ETT inserted through vocal cords under direct vision, positive ETCO2, CO2 detector and breath sounds checked- equal and bilateral Secured at: 20 cm Tube secured with: Tape (pink Hy-tape) Dental Injury: Teeth and Oropharynx as per pre-operative assessment

## 2023-03-25 NOTE — Op Note (Signed)
 Preop diagnosis: Recurrent dislocations L total hip  Postoperative diagnosis: Same  Procedure: Revision L total hip arthroplasty with removal of a 16 mm osteotic's secure HA shell that was retroverted 20 degrees with placement of a 54 mm multihole [employer] TriTanium cup with 3 locking screws dual mobility liner and a +0 x 42 mm dual mobility head. Surgeon: Elgie Grist. Carry Clapper M.D.  Assistant: Bunny Caroli. Horacio Lyell  (present throughout entire procedure and necessary for timely completion of the procedure)  Anesthesia: General Endotracheal  Estimated blood loss: 400 cc  Fluid replacement: 2000 cc of crystalloid  Urine output: Per anesthesia  Complications: None  Specimens: none  Indications: Patient had a primary left total hip done in the year 2003 by another physician.  Did well until last year when she started having spontaneous subluxations of the left total hip when she went from sitting to standing.  She actually is pretty sure she had a few dislocations that she was able to self reduce.  Radiographs in the office showed that the cup abduction angle is 45 degrees but it was retroverted almost 30 degrees on the shoot through lateral.  She denies any fevers or chills or wound problems.  She walks with a normal gait.  In the office when you examine her you can actually get the hip to sublux posteriorly it does have a -3 x 28 mm head.  The cup is well-fixed by radiographs with for supplemental fixation screws.  To decrease the risk of further dislocation revision of the acetabular component to a new shell which is anteverted 20 degrees with a dual mobility construct has been discussed and agreed upon by the patient.  Procedure: Patient was identified by arm band receive preoperative IV antibiotics in the holding area at, [facility] hospital. Taken to the operating room where the appropriate anesthetic monitors were attached and general endotracheal anesthesia induced with the patient in the supine  position. Rolled into the right lateral decubitus position and fixed there with a mark 2 pelvic clamp. The limb prepped and draped in usual sterile fashion from the ankle to the hemipelvis. Time out procedure performed. Skin along the lateral hip and thigh infiltrated with 10 cc of 1/2% Marcaine  and epinephrine  solution. We began the operation by recreating the old posterior lateral incision 15 cm in line through the skin and subcutaneous tissue down to the level of the IT band which was cut in line with the skin incision.  This exposed the greater trochanter and the posterior pseudocapsule was then elevated off the intertrochanteric crest using the electrocautery.  A Hohmann retractor was placed between the superior capsule and the abductors and inferior cobra retractor placed in the cotyloid notch.  At this point we noted that the femoral head was actually dislocated with the patient in neutral rotation.  Posterior capsule was peeled back allowing us  to expose the neck and head of the femoral prosthesis which was noted to be well-fixed.  With the hip flexed and internally rotated the head was then removed with a metal cylinder and a mallet.  We then worked our way around the acetabular component which was retroverted a good 20 degrees removing scar tissue and exposing the rim.  A Hohmann was placed anteriorly a Cobra in the cotyloid notch and another Hohmann superiorly and posteriorly completing our exposure.  We then drilled a 3.2 mm hole in the liner and using a 6.5 millimeter screw broke the locking ring and remove the liner.  This allowed us   to remove the 4 locking screws in the cup.  We then used a Intermed 60 mm curved acetabular cup cutter and remove the cup without too much difficulty.  The patient had good bone anteriorly and posteriorly inferiorly there was some mild deficiency anteriorly.  We then sequentially reamed up to a 53 mm reamer obtaining good coverage superiorly inferiorly and posteriorly with  again a mild deficiency anteriorly.  The 54 mm trial actually seated nicely.  We then selected a 54 mm TriTanium multihole cup and hammered into place and 45 degrees of abduction and 20 degrees of anteversion.  We then used 2 superior supplemental screws and a third supplemental screw in the ischium to obtain good firm fixation of the acetabular shell.  We then performed a reduction using the dual mobility trial liner and a +0 46 mm femoral head and noted that the hip not excellent stability in 90 degrees of flexion and 70 degrees of internal rotation.  The hip could not be dislocated anteriorly.  At this point the trial components were removed and Exparel  was injected into the soft tissues.  We again copiously irrigated with normal saline solution and inserted the dual mobility liner for the 54 mm shell and a +0 20 mm femoral head was then pressed into the 46 mm dual mobility head and hammered onto the femoral stem.  The hip was again reduced and noted to be stable.  We irrigated 1 more time.  The posterior pseudocapsule was repaired back to the intertrochanteric crest through 3 drill holes with #2 Ethibond suture the IT band was closed with running #1 Vicryl suture the subcutaneous tissue with running 3-0 Vicryl suture and a subcuticular closure using 3-0 Vicryl suture was then accomplished.  An Aquacel dressing was applied.  The patient was unclamped rolled supine awakened and taken to the recovery room without difficulty.

## 2023-03-25 NOTE — Transfer of Care (Signed)
 Immediate Anesthesia Transfer of Care Note  Patient: [name] Price  Procedure(s) Performed: LEFT TOTAL HIP REVISION POSTERIOR (Left: Hip)  Patient Location: PACU  Anesthesia Type:General  Level of Consciousness: awake, alert , oriented, and patient cooperative  Airway & Oxygen Therapy: Patient Spontanous Breathing and Patient connected to face mask oxygen  Post-op Assessment: Report given to RN and Post -op Vital signs reviewed and stable  Post vital signs: Reviewed and stable  Last Vitals:  Vitals Value Taken Time  BP 141/81 03/25/23 1025  Temp    Pulse 117 03/25/23 1025  Resp 15 03/25/23 1025  SpO2 100 % 03/25/23 1025  Vitals shown include unfiled device data.  Last Pain:  Vitals:   03/25/23 0542  TempSrc: Oral         Complications: No notable events documented.

## 2023-03-25 NOTE — Evaluation (Signed)
 Physical Therapy Evaluation Patient Details Name: [name] Price MRN: [id number] DOB: 1940/10/17 Today's Date: 03/25/2023  History of Present Illness  83 yo female presents to therapy s/p L THA revision secondary to subluxation and pain. Pt underwent L posterior THA on 03/25/2023 and is currently L LE WBAT with posterior hip precautions. Pt PMH includes but is not limited to: cellulitis, GERD, dpression, anemia, HTN, BPV, diverticulosis, anxiety, ba ca, HLD, B THA and L TSA.  Clinical Impression   [name] Price is a 83 y.o. female POD 0 s/p L THA revision with posterior approach. Patient reports IND with mobility at baseline. Patient is now limited by functional impairments (see PT problem list below) and requires CGA and cues for transfers and gait with RW. Patient was able to ambulate 50 feet with RW and CGA and cues for safe walker management. Patient educated on safe sequencing for functional mobility tasks, fall risk prevention, use of RW, posterior hip precautions with HO provided and reviewed, pain management and goal, use of CP/Ice and need for RW as well as car transfers pt  and spouse verbalized understanding of safe guarding position for people assisting with mobility. Patient instructed in exercises to facilitate ROM and circulation reviewed and HO provided. Patient will benefit from continued skilled PT interventions to address impairments and progress towards PLOF. Patient has met mobility goals at adequate level for discharge home with family support and [facility] services ; will continue to follow if pt continues acute stay to progress towards Mod I goals.       If plan is discharge home, recommend the following: A little help with walking and/or transfers;A little help with bathing/dressing/bathroom;Assistance with cooking/housework;Assist for transportation   Can travel by private vehicle        Equipment Recommendations None recommended by PT  Recommendations for Other  Services       Functional Status Assessment Patient has had a recent decline in their functional status and demonstrates the ability to make significant improvements in function in a reasonable and predictable amount of time.     Precautions / Restrictions Precautions Precautions: Fall;Posterior Hip Restrictions Weight Bearing Restrictions Per Provider Order: No      Mobility  Bed Mobility Overal bed mobility: Needs Assistance Bed Mobility: Supine to Sit     Supine to sit: Supervision, HOB elevated, Used rails     General bed mobility comments: increased time and cues    Transfers Overall transfer level: Needs assistance Equipment used: Rolling walker (2 wheels) Transfers: Sit to/from Stand Sit to Stand: Contact guard assist           General transfer comment: cues for proper UE and AD placement    Ambulation/Gait Ambulation/Gait assistance: Contact guard assist Gait Distance (Feet): 50 Feet Assistive device: Rolling walker (2 wheels) Gait Pattern/deviations: Step-to pattern, Antalgic, Trunk flexed Gait velocity: decreased     General Gait Details: B UE support at RW to offload L LE in stance phase with pt reporting B UE fatigue, step to pattern and min cues for safety and RW management  Stairs            Wheelchair Mobility     Tilt Bed    Modified Rankin (Stroke Patients Only)       Balance Overall balance assessment: History of Falls, Needs assistance (2-3 falls per wk) Sitting-balance support: Feet supported Sitting balance-Leahy Scale: Good     Standing balance support: Bilateral upper extremity supported, During functional activity, Reliant on assistive  device for balance Standing balance-Leahy Scale: Poor                               Pertinent Vitals/Pain Pain Assessment Pain Assessment: 0-10 Pain Score: 0-No pain Pain Location: L hip Pain Descriptors / Indicators: Other (Comment) (pt reported no pain) Pain  Intervention(s): Limited activity within patient's tolerance, Premedicated before session, Monitored during session, Repositioned, Ice applied    Home Living Family/patient expects to be discharged to:: Private residence Living Arrangements: Spouse/significant other Available Help at Discharge: Family;Friend(s) Type of Home: House Home Access: Ramped entrance       Home Layout: One level Home Equipment: Agricultural consultant (2 wheels);Cane - single point      Prior Function Prior Level of Function : Independent/Modified Independent             Mobility Comments: IND no AD for all ADLs, self care tasks and IADLs       Extremity/Trunk Assessment        Lower Extremity Assessment Lower Extremity Assessment: LLE deficits/detail LLE Deficits / Details: ankle DF/PF 5/5 LLE Sensation: WNL    Cervical / Trunk Assessment Cervical / Trunk Assessment: Normal  Communication   Communication Communication: No apparent difficulties  Cognition Arousal: Alert Behavior During Therapy: WFL for tasks assessed/performed Overall Cognitive Status: Within Functional Limits for tasks assessed                                          General Comments      Exercises Total Joint Exercises Ankle Circles/Pumps: AROM, Both, 10 reps Quad Sets: AROM, Left, 5 reps Heel Slides: AROM, Left, 5 reps Long Arc Quad: AROM, Left, 5 reps Knee Flexion: AROM, Left, 5 reps, Standing Standing Hip Extension: AROM, Left, 5 reps   Assessment/Plan    PT Assessment Patient needs continued PT services  PT Problem List Decreased strength;Decreased range of motion;Decreased activity tolerance;Decreased balance;Decreased mobility;Decreased coordination;Pain       PT Treatment Interventions DME instruction;Gait training;Functional mobility training;Therapeutic activities;Therapeutic exercise;Balance training;Neuromuscular re-education;Patient/family education;Modalities    PT Goals (Current  goals can be found in the Care Plan section)  Acute Rehab PT Goals Patient Stated Goal: to be able to get a massage, walk without pain and fear of subluxation/fall PT Goal Formulation: With patient Time For Goal Achievement: 04/08/23 Potential to Achieve Goals: Good    Frequency 7X/week     Co-evaluation               AM-PAC PT "6 Clicks" Mobility  Outcome Measure Help needed turning from your back to your side while in a flat bed without using bedrails?: None Help needed moving from lying on your back to sitting on the side of a flat bed without using bedrails?: A Little Help needed moving to and from a bed to a chair (including a wheelchair)?: A Little Help needed standing up from a chair using your arms (e.g., wheelchair or bedside chair)?: A Little Help needed to walk in hospital room?: A Little Help needed climbing 3-5 steps with a railing? : A Lot 6 Click Score: 18    End of Session Equipment Utilized During Treatment: Gait belt Activity Tolerance: Patient tolerated treatment well;No increased pain Patient left: in chair;with call bell/phone within reach Nurse Communication: Mobility status;Other (comment) (pt readiness for d/c from PT standpoint)  PT Visit Diagnosis: Unsteadiness on feet (R26.81);Other abnormalities of gait and mobility (R26.89);Repeated falls (R29.6);Muscle weakness (generalized) (M62.81);History of falling (Z91.81);Difficulty in walking, not elsewhere classified (R26.2)    Time: 9629-5284 PT Time Calculation (min) (ACUTE ONLY): 38 min   Charges:   PT Evaluation $PT Eval Low Complexity: 1 Low PT Treatments $Gait Training: 8-22 mins $Therapeutic Exercise: 8-22 mins PT General Charges $$ ACUTE PT VISIT: 1 Visit         Cary Clarks, PT Acute Rehab   Annalee Kiang 03/25/2023, 2:22 PM

## 2023-03-25 NOTE — Interval H&P Note (Signed)
 History and Physical Interval Note:  03/25/2023 7:02 AM  [name] Price  has presented today for surgery, with the diagnosis of LEFT HIP SUBLUXATIONI AND PAIN.  The various methods of treatment have been discussed with the patient and family. After consideration of risks, benefits and other options for treatment, the patient has consented to  Procedure(s): LEFT TOTAL HIP REVISION POSTERIOR (Left) as a surgical intervention.  The patient's history has been reviewed, patient examined, no change in status, stable for surgery.  I have reviewed the patient's chart and labs.  Questions were answered to the patient's satisfaction.     Ilean Mall

## 2023-03-25 NOTE — Discharge Instructions (Addendum)

## 2023-03-26 ENCOUNTER — Encounter (HOSPITAL_COMMUNITY): Payer: Self-pay | Admitting: Orthopedic Surgery

## 2023-03-26 NOTE — Anesthesia Postprocedure Evaluation (Signed)
Anesthesia Post Note  Patient: [name] Price  Procedure(s) Performed: LEFT TOTAL HIP REVISION POSTERIOR (Left: Hip)     Patient location during evaluation: PACU Anesthesia Type: General Level of consciousness: sedated and patient cooperative Pain management: pain level controlled Vital Signs Assessment: post-procedure vital signs reviewed and stable Respiratory status: spontaneous breathing Cardiovascular status: stable Anesthetic complications: no   No notable events documented.  Last Vitals:  Vitals:   03/25/23 1133 03/25/23 1145  BP: (!) 121/55 126/64  Pulse: (!) 102 (!) 101  Resp:    Temp: 36.6 C   SpO2: 92% 91%    Last Pain:  Vitals:   03/25/23 1300  TempSrc:   PainSc: 3                  Lewie Loron

## 2023-03-26 NOTE — Discharge Summary (Signed)
Patient ID: [name] Price MRN: [id number] DOB/AGE: Oct 31, 1940 83 y.o.  Admit date: 03/25/2023 Discharge date: 03/26/2023  Admission Diagnoses:  Principal Problem:   Failed total hip arthroplasty [facility]) Active Problems:   Hip dislocation, left [place])   Discharge Diagnoses:  Same  Past Medical History:  Diagnosis Date   Allergy    Anxiety    Arthritis    Blood transfusion without reported diagnosis    as baby   Breast cancer (HCC) 1999-2000   s/p lumpectomy and radiation, no chemo-right   History of chicken pox    History of hip replacement    Hot flashes    treated wtih paxil   Hyperlipidemia    Osteoporosis    DXA 11/12/17   UTI (urinary tract infection)     Surgeries: Procedure(s): LEFT TOTAL HIP REVISION POSTERIOR on 03/25/2023   Consultants:   Discharged Condition: Improved  Hospital Course: [name] Price is an 83 y.o. female who was admitted 03/25/2023 for operative treatment ofFailed total hip arthroplasty (HCC). Patient has severe unremitting pain that affects sleep, daily activities, and work/hobbies. After pre-op clearance the patient was taken to the operating room on 03/25/2023 and underwent  Procedure(s): LEFT TOTAL HIP REVISION POSTERIOR.    Patient was given perioperative antibiotics:  Anti-infectives (From admission, onward)    Start     Dose/Rate Route Frequency Ordered Stop   03/25/23 0600  ceFAZolin (ANCEF) IVPB 2g/100 mL premix        2 g 200 mL/hr over 30 Minutes Intravenous On call to O.R. 03/25/23 4098 03/25/23 0736        Patient was given sequential compression devices, early ambulation, and chemoprophylaxis to prevent DVT.  Patient benefited maximally from hospital stay and there were no complications.    Recent vital signs: No data found.   Recent laboratory studies: No results for input(s): "WBC", "HGB", "HCT", "PLT", "NA", "K", "CL", "CO2", "BUN", "CREATININE", "GLUCOSE", "INR", "CALCIUM" in the last 72 hours.  Invalid  input(s): "PT", "2"   Discharge Medications:   Allergies as of 03/25/2023       Reactions   Codeine Nausea And Vomiting   Fosamax [alendronate]    GI intolerance and "didn't feel well on medicine."          Medication List     STOP taking these medications    naproxen sodium 220 MG tablet Commonly known as: ALEVE       TAKE these medications    ascorbic acid 500 MG tablet Commonly known as: VITAMIN C Take 500 mg by mouth daily.   aspirin EC 81 MG tablet Take 1 tablet (81 mg total) by mouth 2 (two) times daily.   CALCIUM + D PO Take 1 tablet by mouth daily.   cetirizine 10 MG tablet Commonly known as: ZYRTEC Take 10 mg by mouth daily as needed for allergies.   cyanocobalamin 1000 MCG tablet Commonly known as: VITAMIN B12 Take 1,000 mcg by mouth daily.   HYDROmorphone 2 MG tablet Commonly known as: Dilaudid Take 1 tablet (2 mg total) by mouth every 4 (four) hours as needed for severe pain (pain score 7-10).   PARoxetine 25 MG 24 hr tablet Commonly known as: PAXIL-CR TAKE 1 TABLET BY MOUTH EVERY DAY IN THE MORNING   tiZANidine 2 MG tablet Commonly known as: ZANAFLEX Take 1 tablet (2 mg total) by mouth every 6 (six) hours as needed for muscle spasms.   Vitamin B Complex Tabs Take 1 tablet by mouth every  morning.        Diagnostic Studies: DG HIP UNILAT W OR W/O PELVIS 2-3 VIEWS LEFT Result Date: 03/25/2023 CLINICAL DATA:  Revision total hip arthroplasty. EXAM: DG HIP (WITH OR WITHOUT PELVIS) 2-3V LEFT COMPARISON:  12/10/2022. FINDINGS: Interval revision left total hip arthroplasty. Subcutaneous and joint air present. Femoral stem appears well seated. Right hip arthroplasty, as before. IMPRESSION: Revision left total hip arthroplasty with expected postoperative findings. Electronically Signed   By: Leanna Battles M.D.   On: 03/25/2023 15:13    Disposition: Discharge disposition: 01-Home or Self Care          Follow-up Information     Gean Birchwood, MD Follow up in 2 week(s).   Specialty: Orthopedic Surgery Contact information: [address] [place] [address] [phone]                  Signed: Dannielle Burn 03/26/2023, 3:24 PM

## 2023-03-30 DIAGNOSIS — Z87440 Personal history of urinary (tract) infections: Secondary | ICD-10-CM | POA: Diagnosis not present

## 2023-03-30 DIAGNOSIS — M810 Age-related osteoporosis without current pathological fracture: Secondary | ICD-10-CM | POA: Diagnosis not present

## 2023-03-30 DIAGNOSIS — Z7982 Long term (current) use of aspirin: Secondary | ICD-10-CM | POA: Diagnosis not present

## 2023-03-30 DIAGNOSIS — T84091D Other mechanical complication of internal left hip prosthesis, subsequent encounter: Secondary | ICD-10-CM | POA: Diagnosis not present

## 2023-03-30 DIAGNOSIS — Z791 Long term (current) use of non-steroidal anti-inflammatories (NSAID): Secondary | ICD-10-CM | POA: Diagnosis not present

## 2023-03-30 DIAGNOSIS — Z96641 Presence of right artificial hip joint: Secondary | ICD-10-CM | POA: Diagnosis not present

## 2023-03-30 DIAGNOSIS — T84021D Dislocation of internal left hip prosthesis, subsequent encounter: Secondary | ICD-10-CM | POA: Diagnosis not present

## 2023-03-30 DIAGNOSIS — F419 Anxiety disorder, unspecified: Secondary | ICD-10-CM | POA: Diagnosis not present

## 2023-03-30 DIAGNOSIS — E785 Hyperlipidemia, unspecified: Secondary | ICD-10-CM | POA: Diagnosis not present

## 2023-03-30 DIAGNOSIS — Z853 Personal history of malignant neoplasm of breast: Secondary | ICD-10-CM | POA: Diagnosis not present

## 2023-04-01 DIAGNOSIS — T84021D Dislocation of internal left hip prosthesis, subsequent encounter: Secondary | ICD-10-CM | POA: Diagnosis not present

## 2023-04-01 DIAGNOSIS — E785 Hyperlipidemia, unspecified: Secondary | ICD-10-CM | POA: Diagnosis not present

## 2023-04-01 DIAGNOSIS — M810 Age-related osteoporosis without current pathological fracture: Secondary | ICD-10-CM | POA: Diagnosis not present

## 2023-04-01 DIAGNOSIS — F419 Anxiety disorder, unspecified: Secondary | ICD-10-CM | POA: Diagnosis not present

## 2023-04-01 DIAGNOSIS — Z791 Long term (current) use of non-steroidal anti-inflammatories (NSAID): Secondary | ICD-10-CM | POA: Diagnosis not present

## 2023-04-01 DIAGNOSIS — T84091D Other mechanical complication of internal left hip prosthesis, subsequent encounter: Secondary | ICD-10-CM | POA: Diagnosis not present

## 2023-04-02 DIAGNOSIS — T84021D Dislocation of internal left hip prosthesis, subsequent encounter: Secondary | ICD-10-CM | POA: Diagnosis not present

## 2023-04-02 DIAGNOSIS — E785 Hyperlipidemia, unspecified: Secondary | ICD-10-CM | POA: Diagnosis not present

## 2023-04-02 DIAGNOSIS — F419 Anxiety disorder, unspecified: Secondary | ICD-10-CM | POA: Diagnosis not present

## 2023-04-02 DIAGNOSIS — Z791 Long term (current) use of non-steroidal anti-inflammatories (NSAID): Secondary | ICD-10-CM | POA: Diagnosis not present

## 2023-04-02 DIAGNOSIS — M810 Age-related osteoporosis without current pathological fracture: Secondary | ICD-10-CM | POA: Diagnosis not present

## 2023-04-02 DIAGNOSIS — T84091D Other mechanical complication of internal left hip prosthesis, subsequent encounter: Secondary | ICD-10-CM | POA: Diagnosis not present

## 2023-04-04 DIAGNOSIS — T84021A Dislocation of internal left hip prosthesis, initial encounter: Secondary | ICD-10-CM | POA: Diagnosis not present

## 2023-04-05 DIAGNOSIS — Z791 Long term (current) use of non-steroidal anti-inflammatories (NSAID): Secondary | ICD-10-CM | POA: Diagnosis not present

## 2023-04-05 DIAGNOSIS — T84091D Other mechanical complication of internal left hip prosthesis, subsequent encounter: Secondary | ICD-10-CM | POA: Diagnosis not present

## 2023-04-05 DIAGNOSIS — M810 Age-related osteoporosis without current pathological fracture: Secondary | ICD-10-CM | POA: Diagnosis not present

## 2023-04-05 DIAGNOSIS — E785 Hyperlipidemia, unspecified: Secondary | ICD-10-CM | POA: Diagnosis not present

## 2023-04-05 DIAGNOSIS — F419 Anxiety disorder, unspecified: Secondary | ICD-10-CM | POA: Diagnosis not present

## 2023-04-05 DIAGNOSIS — T84021D Dislocation of internal left hip prosthesis, subsequent encounter: Secondary | ICD-10-CM | POA: Diagnosis not present

## 2023-04-08 DIAGNOSIS — M810 Age-related osteoporosis without current pathological fracture: Secondary | ICD-10-CM | POA: Diagnosis not present

## 2023-04-08 DIAGNOSIS — F419 Anxiety disorder, unspecified: Secondary | ICD-10-CM | POA: Diagnosis not present

## 2023-04-08 DIAGNOSIS — T84091D Other mechanical complication of internal left hip prosthesis, subsequent encounter: Secondary | ICD-10-CM | POA: Diagnosis not present

## 2023-04-08 DIAGNOSIS — T84021D Dislocation of internal left hip prosthesis, subsequent encounter: Secondary | ICD-10-CM | POA: Diagnosis not present

## 2023-04-08 DIAGNOSIS — Z791 Long term (current) use of non-steroidal anti-inflammatories (NSAID): Secondary | ICD-10-CM | POA: Diagnosis not present

## 2023-04-08 DIAGNOSIS — E785 Hyperlipidemia, unspecified: Secondary | ICD-10-CM | POA: Diagnosis not present

## 2023-04-10 DIAGNOSIS — T84091D Other mechanical complication of internal left hip prosthesis, subsequent encounter: Secondary | ICD-10-CM | POA: Diagnosis not present

## 2023-04-10 DIAGNOSIS — F419 Anxiety disorder, unspecified: Secondary | ICD-10-CM | POA: Diagnosis not present

## 2023-04-10 DIAGNOSIS — T84021D Dislocation of internal left hip prosthesis, subsequent encounter: Secondary | ICD-10-CM | POA: Diagnosis not present

## 2023-04-10 DIAGNOSIS — M810 Age-related osteoporosis without current pathological fracture: Secondary | ICD-10-CM | POA: Diagnosis not present

## 2023-04-10 DIAGNOSIS — E785 Hyperlipidemia, unspecified: Secondary | ICD-10-CM | POA: Diagnosis not present

## 2023-04-10 DIAGNOSIS — Z791 Long term (current) use of non-steroidal anti-inflammatories (NSAID): Secondary | ICD-10-CM | POA: Diagnosis not present

## 2023-04-12 ENCOUNTER — Ambulatory Visit: Payer: Medicare Other

## 2023-04-12 VITALS — Ht 63.0 in | Wt 144.0 lb

## 2023-04-12 DIAGNOSIS — Z0000 Encounter for general adult medical examination without abnormal findings: Secondary | ICD-10-CM | POA: Diagnosis not present

## 2023-04-12 NOTE — Progress Notes (Addendum)
 Subjective:   [name] Price is a 83 y.o. who presents for a Medicare Wellness preventive visit.  Visit Complete: Virtual I connected with  [name] Price on 04/12/23 by a audio enabled telemedicine application and verified that I am speaking with the correct person using two identifiers.  Patient Location: Home  Provider Location: Office/Clinic  I discussed the limitations of evaluation and management by telemedicine. The patient expressed understanding and agreed to proceed.  Vital Signs: Because this visit was a virtual/telehealth visit, some criteria may be missing or patient reported. Any vitals not documented were not able to be obtained and vitals that have been documented are patient reported.  VideoDeclined- This patient declined Librarian, academic. Therefore the visit was completed with audio only.  AWV Questionnaire: No: Patient Medicare AWV questionnaire was not completed prior to this visit.  Cardiac Risk Factors include: advanced age (>6men, >29 women);hypertension;sedentary lifestyle     Objective:    Today's Vitals   04/12/23 1131 04/12/23 1132  Weight: 144 lb (65.3 kg)   Height: 5\' 3"  (1.6 m)   PainSc:  0-No pain   Body mass index is 25.51 kg/m.     04/12/2023   11:41 AM 03/25/2023    5:32 AM 03/12/2023    1:12 PM 09/06/2021   12:29 PM 01/13/2021    9:57 AM 01/03/2021    3:13 PM 11/28/2020    2:17 PM  Advanced Directives  Does Patient Have a Medical Advance Directive? Yes No No Yes Yes Yes Yes  Type of Clinical research associate of [place];Living will Healthcare Power of [employer] Power of Attorney  Does patient want to make changes to medical advance directive?     No - Patient declined No - Patient declined No - Patient declined  Copy of Healthcare Power of Attorney in Chart?    No - copy requested No - copy requested    Would patient like information on creating a  medical advance directive?  No - Patient declined   No - Patient declined No - Patient declined     Current Medications (verified) Outpatient Encounter Medications as of 04/12/2023  Medication Sig   ascorbic acid (VITAMIN C) 500 MG tablet Take 500 mg by mouth daily.   aspirin EC 81 MG tablet Take 1 tablet (81 mg total) by mouth 2 (two) times daily.   B Complex Vitamins (VITAMIN B COMPLEX) TABS Take 1 tablet by mouth every morning.   Calcium Citrate-Vitamin D (CALCIUM + D PO) Take 1 tablet by mouth daily.   celecoxib (CELEBREX) 200 MG capsule Take 200 mg by mouth 2 (two) times daily.   cetirizine (ZYRTEC) 10 MG tablet Take 10 mg by mouth daily as needed for allergies.   cyanocobalamin (VITAMIN B12) 1000 MCG tablet Take 1,000 mcg by mouth daily.   PARoxetine (PAXIL-CR) 25 MG 24 hr tablet TAKE 1 TABLET BY MOUTH EVERY DAY IN THE MORNING   tiZANidine (ZANAFLEX) 2 MG tablet Take 1 tablet (2 mg total) by mouth every 6 (six) hours as needed for muscle spasms.   HYDROmorphone (DILAUDID) 2 MG tablet Take 1 tablet (2 mg total) by mouth every 4 (four) hours as needed for severe pain (pain score 7-10). (Patient not taking: Reported on 04/12/2023)   No facility-administered encounter medications on file as of 04/12/2023.    Allergies (verified) Codeine and Fosamax [alendronate]   History: Past Medical History:  Diagnosis Date  Allergy    Anxiety    Arthritis    Blood transfusion without reported diagnosis    as baby   Breast cancer (HCC) 1999-2000   s/p lumpectomy and radiation, no chemo-right   History of chicken pox    History of hip replacement    Hot flashes    treated wtih paxil   Hyperlipidemia    Osteoporosis    DXA 11/12/17   UTI (urinary tract infection)    Past Surgical History:  Procedure Laterality Date   APPLICATION OF WOUND VAC Right 11/08/2020   Procedure: APPLICATION OF WOUND VAC;  Surgeon: Allena Napoleon, MD;  Location: [facility];  Service: Plastics;  Laterality: Right;    BREAST SURGERY Right    CATARACT EXTRACTION W/PHACO Left 12/28/2014   Procedure: CATARACT EXTRACTION PHACO AND INTRAOCULAR LENS PLACEMENT (IOC);  Surgeon: Galen Manila, MD;  Location: [facility];  Service: Ophthalmology;  Laterality: Left;  [place] 00:38 AP% 19.9 CDE 7.70 fluid pack lot # [id number]   CATARACT EXTRACTION W/PHACO Right 07/10/2016   Procedure: CATARACT EXTRACTION PHACO AND INTRAOCULAR LENS PLACEMENT (IOC);  Surgeon: Galen Manila, MD;  Location: [facility];  Service: Ophthalmology;  Laterality: Right;  [place] 00:38 AP% 18.3 CDE 7.06 Fluid pack lot # [id number]   COLONOSCOPY  2016   DEBRIDEMENT AND CLOSURE WOUND Right 11/01/2020   Procedure: DEBRIDEMENT RIGHT FOOT WOUND;  Surgeon: Allena Napoleon, MD;  Location: [facility];  Service: Plastics;  Laterality: Right;  60 minutes   DEBRIDEMENT AND CLOSURE WOUND Right 11/08/2020   Procedure: Debridement of r. foot wound;  Surgeon: Allena Napoleon, MD;  Location: [facility];  Service: Plastics;  Laterality: Right;   EYE SURGERY     JOINT REPLACEMENT Bilateral    hip  / shoulder   OOPHORECTOMY Left    POLYPECTOMY     SKIN SPLIT GRAFT Right 01/13/2021   Procedure: SKIN GRAFT SPLIT THICKNESS;  Surgeon: Allena Napoleon, MD;  Location: [facility];  Service: Plastics;  Laterality: Right;   TOTAL HIP ARTHROPLASTY Bilateral    (2) Two   TOTAL HIP REVISION Left 03/25/2023   Procedure: LEFT TOTAL HIP REVISION POSTERIOR;  Surgeon: Gean Birchwood, MD;  Location: [facility];  Service: Orthopedics;  Laterality: Left;   TOTAL SHOULDER REPLACEMENT Left    Family History  Problem Relation Age of Onset   Lung cancer Mother    Heart disease Mother    Colon cancer Mother        late 22's   Cancer Father    Colon cancer Father 26       died at 46   Breast cancer Maternal Aunt    Breast cancer Maternal Aunt    Breast cancer Maternal Aunt    Rectal cancer Neg Hx    Stomach cancer Neg Hx    Crohn's disease Neg Hx    Esophageal cancer Neg Hx     Social History   Socioeconomic History   Marital status: Married    Spouse name: Not on file   Number of children: Not on file   Years of education: Not on file   Highest education level: Not on file  Occupational History   Not on file  Tobacco Use   Smoking status: Never   Smokeless tobacco: Never  Vaping Use   Vaping status: Never Used  Substance and Sexual Activity   Alcohol use: No    Comment: rare once a year   Drug use: No  Sexual activity: Not Currently  Other Topics Concern   Not on file  Social History Narrative   Married 1963   Likes to travel   2 daughters   Social Drivers of Corporate investment banker Strain: Low Risk  (04/12/2023)   Overall Financial Resource Strain (CARDIA)    Difficulty of Paying Living Expenses: Not hard at all  Food Insecurity: No Food Insecurity (04/12/2023)   Hunger Vital Sign    Worried About Running Out of Food in the Last Year: Never true    Ran Out of Food in the Last Year: Never true  Transportation Needs: No Transportation Needs (04/12/2023)   PRAPARE - Administrator, Civil Service (Medical): No    Lack of Transportation (Non-Medical): No  Physical Activity: Insufficiently Active (04/12/2023)   Exercise Vital Sign    Days of Exercise per Week: 7 days    Minutes of Exercise per Session: 20 min  Stress: No Stress Concern Present (04/12/2023)   [employer] of Occupational Health - Occupational Stress Questionnaire    Feeling of Stress : Not at all  Social Connections: Moderately Isolated (04/12/2023)   Social Connection and Isolation Panel [NHANES]    Frequency of Communication with Friends and Family: Twice a week    Frequency of Social Gatherings with Friends and Family: Twice a week    Attends Religious Services: Never    Database administrator or Organizations: No    Attends Engineer, structural: Never    Marital Status: Married    Tobacco Counseling Counseling given: Not  Answered    Clinical Intake:  Pre-visit preparation completed: Yes  Pain : No/denies pain Pain Score: 0-No pain     BMI - recorded: 25.51 Nutritional Status: BMI 25 -29 Overweight Nutritional Risks: None Diabetes: No  How often do you need to have someone help you when you read instructions, pamphlets, or other written materials from your doctor or pharmacy?: 1 - Never  Interpreter Needed?: No  Comments: lives with husband and grandson Information entered by :: B.Tamberlyn Midgley,LPN   Activities of Daily Living     04/12/2023   11:42 AM 03/12/2023    1:14 PM  In your present state of health, do you have any difficulty performing the following activities:  Hearing? 0   Vision? 0   Difficulty concentrating or making decisions? 0   Walking or climbing stairs? 0   Dressing or bathing? 0   Doing errands, shopping? 0 0  Preparing Food and eating ? N   Using the Toilet? N   In the past six months, have you accidently leaked urine? N   Do you have problems with loss of bowel control? N   Managing your Medications? N   Managing your Finances? N   Housekeeping or managing your Housekeeping? N     Patient Care Team: Joaquim Nam, MD as PCP - General (Family Medicine) [facility]  Indicate any recent Medical Services you may have received from other than [facility] providers in the past year (date may be approximate).     Assessment:   This is a routine wellness examination for Guys.  Hearing/Vision screen Hearing Screening - Comments:: Pt says her hearing is good  Vision Screening - Comments:: Pt says her vision is good; glasses for driving and readers Dr Druscilla Brownie   Goals Addressed             This Visit's Progress    Improved wound  healing and management of chronic conditions.   On track    Timeframe:  Long-Range Goal Priority:  High Start Date:     11/29/2020                        Expected End Date:   04/11/2020                      Follow up:02/09/2021  Take your medications as prescribed and refill timely Attend all scheduled provider appointments Call provider office for new concerns or questions Review education articles sent to you in MyChart on High blood pressure management   Work with the home health agency in management of wound care/ vac.  Call if you have questions Avoid rubbing and scratching areas near/ around wound.  Rubbing and scratching can cause further injury and delay healing Eat a diet that promotes healing ( high protein, calorie diet)  Notify doctor for signs/ symptoms of infection ( increased wound drainage, increasing pain, local warmth and redness, fever)     Patient Stated   On track    04/12/23- I will continue to take medications as prescribed.      Patient Stated   On track    04/12/23-wants to keep weight down       Depression Screen     04/12/2023   11:39 AM 08/27/2022    3:36 PM 09/06/2021   12:30 PM 11/28/2020    2:23 PM 06/24/2018    9:03 AM 06/17/2017   11:26 AM 06/15/2016   11:29 AM  PHQ 2/9 Scores  PHQ - 2 Score 0 0 0 0 0 0 0  PHQ- 9 Score  0   0 0     Fall Risk     04/12/2023   11:34 AM 08/27/2022    3:35 PM 06/29/2022    9:54 AM 09/06/2021   12:30 PM 11/28/2020    2:23 PM  Fall Risk   Falls in the past year? 1 0 0 0 0  Comment hip "kept popping out"      Number falls in past yr: 1 0 0 0 0  Injury with Fall? 0 0 0 0   Risk for fall due to : No Fall Risks  No Fall Risks Medication side effect   Follow up Education provided;Falls prevention discussed  Falls evaluation completed Falls evaluation completed;Education provided;Falls prevention discussed     MEDICARE RISK AT HOME:  Medicare Risk at Home Any stairs in or around the home?: Yes If so, are there any without handrails?: Yes Home free of loose throw rugs in walkways, pet beds, electrical cords, etc?: Yes Adequate lighting in your home to reduce risk of falls?: Yes Life alert?: No Use of a cane, walker or  w/c?: Yes (cane) Grab bars in the bathroom?: Yes Shower chair or bench in shower?: Yes Elevated toilet seat or a handicapped toilet?: Yes  TIMED UP AND GO:  Was the test performed?  No  Cognitive Function: 6CIT completed    06/24/2018    9:41 AM 06/17/2017   11:27 AM 06/15/2016   11:41 AM 05/30/2015   11:33 AM  MMSE - Mini Mental State Exam  Orientation to time 5 5 5 5   Orientation to Place 5 5 5 5   Registration 3 3 3 3   Attention/ Calculation 0 0 0 0  Recall 3 3 3 2   Recall-comments    recalled 2 of 3 words without  cue  Language- name 2 objects 0 0 0 0  Language- repeat 1 1 1 1   Language- follow 3 step command 0 3 3 3   Language- read & follow direction 0 0 0 0  Write a sentence 0 0 0 0  Copy design 0 0 0 0  Total score 17 20 20 19         04/12/2023   11:44 AM 09/06/2021   12:31 PM  6CIT Screen  What Year? 0 points 0 points  What month? 0 points 0 points  What time? 0 points 0 points  Count back from 20 0 points 0 points  Months in reverse 0 points 0 points  Repeat phrase 0 points 0 points  Total Score 0 points 0 points    Immunizations Immunization History  Administered Date(s) Administered   Fluad Quad(high Dose 65+) 10/27/2018, 11/07/2020, 12/10/2021   Fluad Trivalent(High Dose 65+) 12/17/2022   Influenza Split 11/10/2010   Influenza Whole 11/13/2007   Influenza, High Dose Seasonal PF 12/27/2015, 11/08/2016, 11/12/2017   Influenza-Unspecified 11/12/2013, 11/24/2019   PFIZER(Purple Top)SARS-COV-2 Vaccination 03/07/2019, 03/28/2019, 12/09/2019   Pneumococcal Conjugate-13 05/30/2015, 12/04/2016   Pneumococcal Polysaccharide-23 03/03/2013   Rsv, Bivalent, Protein Subunit Rsvpref,pf (Abrysvo) 12/17/2022   Td 02/13/1991, 03/14/2015   Zoster, Live 02/12/2022    Screening Tests Health Maintenance  Topic Date Due   Zoster Vaccines- Shingrix (1 of 2) 01/14/1962   MAMMOGRAM  06/20/2021   Colonoscopy  07/11/2022   COVID-19 Vaccine (4 - 2024-25 season) 02/27/2024  (Originally 10/14/2022)   Medicare Annual Wellness (AWV)  04/11/2024   DTaP/Tdap/Td (3 - Tdap) 03/13/2025   Pneumonia Vaccine 64+ Years old  Completed   INFLUENZA VACCINE  Completed   DEXA SCAN  Completed   HPV VACCINES  Aged Out    Health Maintenance  Health Maintenance Due  Topic Date Due   Zoster Vaccines- Shingrix (1 of 2) 01/14/1962   MAMMOGRAM  06/20/2021   Colonoscopy  07/11/2022   Health Maintenance Items Addressed: none needed   Additional Screening:  Vision Screening: Recommended annual ophthalmology exams for early detection of glaucoma and other disorders of the eye.  Dental Screening: Recommended annual dental exams for proper oral hygiene  Community Resource Referral / Chronic Care Management: CRR required this visit?  No   CCM required this visit?  Pt declined :: Appt scheduled with PCP    Plan:     I have personally reviewed and noted the following in the patient's chart:   Medical and social history Use of alcohol, tobacco or illicit drugs  Current medications and supplements including opioid prescriptions. Patient is not currently taking opioid prescriptions. Functional ability and status Nutritional status Physical activity Advanced directives List of other physicians Hospitalizations, surgeries, and ER visits in previous 12 months Vitals Screenings to include cognitive, depression, and falls Referrals and appointments  In addition, I have reviewed and discussed with patient certain preventive protocols, quality metrics, and best practice recommendations. A written personalized care plan for preventive services as well as general preventive health recommendations were provided to patient.    Sue Lush, LPN   [id number]   After Visit Summary: (MyChart) Due to this being a telephonic visit, the after visit summary with patients personalized plan was offered to patient via MyChart   Notes: Pt relays she his 4 weeks s/p left hip replacement  (correction) and is doing well. She says she takes no pain medications (as no pain) just sore sometimes. She continues to use a  walker and has drive once or twice short distance to store. She has no conerns or questions at this time.

## 2023-04-12 NOTE — Patient Instructions (Signed)
 [name] Price , Thank you for taking time to come for your Medicare Wellness Visit. I appreciate your ongoing commitment to your health goals. Please review the following plan we discussed and let me know if I can assist you in the future.   Referrals/Orders/Follow-Ups/Clinician Recommendations: none  This is a list of the screening recommended for you and due dates:  Health Maintenance  Topic Date Due   Zoster (Shingles) Vaccine (1 of 2) 01/14/1962   Mammogram  06/20/2021   Colon Cancer Screening  07/11/2022   COVID-19 Vaccine (4 - 2024-25 season) 02/27/2024*   Medicare Annual Wellness Visit  04/11/2024   DTaP/Tdap/Td vaccine (3 - Tdap) 03/13/2025   Pneumonia Vaccine  Completed   Flu Shot  Completed   DEXA scan (bone density measurement)  Completed   HPV Vaccine  Aged Out  *Topic was postponed. The date shown is not the original due date.    Advanced directives: (Copy Requested) Please bring a copy of your health care power of attorney and living will to the office to be added to your chart at your convenience.  Next Medicare Annual Wellness Visit scheduled for next year: Yes 04/17/23 @ 11:30am televisit

## 2023-05-13 DIAGNOSIS — Z01419 Encounter for gynecological examination (general) (routine) without abnormal findings: Secondary | ICD-10-CM | POA: Diagnosis not present

## 2023-05-13 DIAGNOSIS — Z6827 Body mass index (BMI) 27.0-27.9, adult: Secondary | ICD-10-CM | POA: Diagnosis not present

## 2023-05-14 ENCOUNTER — Telehealth: Payer: Self-pay | Admitting: Internal Medicine

## 2023-05-14 NOTE — Telephone Encounter (Signed)
 Good Afternoon Dr. Rhea Belton, I received a referral for this patient for a colonoscopy. This patient does have a over due recall for a colonoscopy of May of 2024. Would you please advise on scheduling this patient. Thank you.

## 2023-05-16 NOTE — Telephone Encounter (Signed)
 colonoscopy is technically due based on personal and family history however at age 83 we should discuss this in an office visit first [facility]

## 2023-05-21 ENCOUNTER — Encounter: Payer: Self-pay | Admitting: Internal Medicine

## 2023-07-02 ENCOUNTER — Other Ambulatory Visit: Payer: Self-pay | Admitting: Family Medicine

## 2023-08-07 ENCOUNTER — Encounter: Payer: Self-pay | Admitting: Family Medicine

## 2023-08-07 ENCOUNTER — Ambulatory Visit (INDEPENDENT_AMBULATORY_CARE_PROVIDER_SITE_OTHER): Admitting: Family Medicine

## 2023-08-07 VITALS — BP 124/84 | HR 76 | Temp 98.4°F | Ht 63.0 in | Wt 145.4 lb

## 2023-08-07 DIAGNOSIS — L259 Unspecified contact dermatitis, unspecified cause: Secondary | ICD-10-CM | POA: Insufficient documentation

## 2023-08-07 NOTE — Progress Notes (Signed)
 Subjective:    Patient ID: [name] Price, female    DOB: Apr 04, 1940, 83 y.o.   MRN: [id number]  HPI  Wt Readings from Last 3 Encounters:  08/07/23 145 lb 6 oz (65.9 kg)  04/12/23 144 lb (65.3 kg)  03/25/23 144 lb (65.3 kg)   25.75 kg/m  Vitals:   08/07/23 1454  BP: 124/84  Pulse: 76  Temp: 98.4 F (36.9 C)  SpO2: 96%   83 yo pf of Dr Cleatus presents with c/o Eye redness   Right eye has been red 2 weeks Some crust in eyelashes A little sore laterally   No injury  No light sensitivity  Some eye fatigue at night watching TV but no vision change  No pain with movement  No itching   Has been working in dust - her vents were all replaced recently   No congestion or cold symptom   Does not feel gritty  Other eye is fine   No rash  Has a bump below brow on that side        Patient Active Problem List   Diagnosis Date Noted   Contact dermatitis 08/07/2023   Hip dislocation, left (HCC) 03/25/2023   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   Hip pain 02/12/2023   Cellulitis 12/17/2022   Eye swelling, bilateral 11/21/2022   Closed dislocation of left hip (HCC) 11/21/2022   Lung field abnormal finding on examination 06/29/2022   Dermatitis 02/20/2022   Gastroesophageal reflux disease    Depression    Wound of right foot 10/29/2020   Anemia 10/29/2020   HTN (hypertension) 10/29/2020   Rash 09/05/2020   History of hip replacement    Advance care planning 06/25/2017   Health care maintenance 06/25/2017   Hot flashes 06/25/2017   Acute cough 02/26/2017   BPV (benign positional vertigo) 03/15/2015   Ganglion cyst of wrist 02/23/2015   Extensor tendon rupture of hand 02/01/2015   Left wrist effusion 01/26/2015   KNEE PAIN, RIGHT 11/13/2007   CHONDROMALACIA PATELLA, LEFT 03/19/2007   Osteoporosis 03/19/2007   HEMATURIA, HX OF 03/19/2007   DIVERTICULOSIS, COLON 08/10/2002   HYPERCHOLESTEROLEMIA 06/05/1996   Past Medical History:  Diagnosis  Date   Allergy    Anxiety    Arthritis    Blood transfusion without reported diagnosis    as baby   Breast cancer (HCC) 1999-2000   s/p lumpectomy and radiation, no chemo-right   Cataract    History of chicken pox    History of hip replacement    Hot flashes    treated wtih paxil    Hyperlipidemia    Osteoporosis    DXA 11/12/17   UTI (urinary tract infection)    Past Surgical History:  Procedure Laterality Date   APPLICATION OF WOUND VAC Right 11/08/2020   Procedure: APPLICATION OF WOUND VAC;  Surgeon: Elisabeth Craig RAMAN, MD;  Location: [facility];  Service: Plastics;  Laterality: Right;   BREAST SURGERY Right    CATARACT EXTRACTION W/PHACO Left 12/28/2014   Procedure: CATARACT EXTRACTION PHACO AND INTRAOCULAR LENS PLACEMENT (IOC);  Surgeon: Elsie Carmine, MD;  Location: [facility];  Service: Ophthalmology;  Laterality: Left;  US  00:38 AP% 19.9 CDE 7.70 fluid pack lot # [id number]   CATARACT EXTRACTION W/PHACO Right 07/10/2016   Procedure: CATARACT EXTRACTION PHACO AND INTRAOCULAR LENS PLACEMENT (IOC);  Surgeon: Carmine Elsie, MD;  Location: [facility];  Service: Ophthalmology;  Laterality: Right;  US  00:38 AP% 18.3 CDE 7.06 Fluid pack lot # [id number]  COLONOSCOPY  2016   DEBRIDEMENT AND CLOSURE WOUND Right 11/01/2020   Procedure: DEBRIDEMENT RIGHT FOOT WOUND;  Surgeon: Elisabeth Craig RAMAN, MD;  Location: [facility];  Service: Plastics;  Laterality: Right;  60 minutes   DEBRIDEMENT AND CLOSURE WOUND Right 11/08/2020   Procedure: Debridement of r. foot wound;  Surgeon: Elisabeth Craig RAMAN, MD;  Location: [facility];  Service: Plastics;  Laterality: Right;   EYE SURGERY     JOINT REPLACEMENT Bilateral    hip  / shoulder   OOPHORECTOMY Left    POLYPECTOMY     SKIN SPLIT GRAFT Right 01/13/2021   Procedure: SKIN GRAFT SPLIT THICKNESS;  Surgeon: Elisabeth Craig RAMAN, MD;  Location: [facility];  Service: Plastics;  Laterality: Right;   TOTAL HIP ARTHROPLASTY Bilateral    (2) Two   TOTAL  HIP REVISION Left 03/25/2023   Procedure: LEFT TOTAL HIP REVISION POSTERIOR;  Surgeon: Liam Lerner, MD;  Location: [facility];  Service: Orthopedics;  Laterality: Left;   TOTAL SHOULDER REPLACEMENT Left    TUBAL LIGATION     Social History   Tobacco Use   Smoking status: Never   Smokeless tobacco: Never  Vaping Use   Vaping status: Never Used  Substance Use Topics   Alcohol use: No    Comment: rare once a year   Drug use: No   Family History  Problem Relation Age of Onset   Lung cancer Mother    Heart disease Mother    Colon cancer Mother        late 7's   Cancer Father    Colon cancer Father 70       died at 38   Breast cancer Maternal Aunt    Breast cancer Maternal Aunt    Breast cancer Maternal Aunt    Rectal cancer Neg Hx    Stomach cancer Neg Hx    Crohn's disease Neg Hx    Esophageal cancer Neg Hx    Allergies  Allergen Reactions   Codeine Nausea And Vomiting   Fosamax  [Alendronate ]     GI intolerance and didn't feel well on medicine.     Current Outpatient Medications on File Prior to Visit  Medication Sig Dispense Refill   ascorbic acid  (VITAMIN C) 500 MG tablet Take 500 mg by mouth daily.     aspirin  EC 81 MG tablet Take 1 tablet (81 mg total) by mouth 2 (two) times daily. 60 tablet 0   B Complex Vitamins (VITAMIN B COMPLEX) TABS Take 1 tablet by mouth every morning.     Calcium  Citrate-Vitamin D  (CALCIUM  + D PO) Take 1 tablet by mouth daily.     cetirizine (ZYRTEC) 10 MG tablet Take 10 mg by mouth daily as needed for allergies.     cyanocobalamin (VITAMIN B12) 1000 MCG tablet Take 1,000 mcg by mouth daily.     PARoxetine  (PAXIL -CR) 25 MG 24 hr tablet TAKE 1 TABLET BY MOUTH EVERY DAY IN THE MORNING 90 tablet 0   No current facility-administered medications on file prior to visit.    Review of Systems  Constitutional:  Negative for fatigue and fever.  HENT:  Negative for congestion, postnasal drip, rhinorrhea, sinus pressure, sinus pain, sneezing and  sore throat.   Eyes:  Positive for redness. Negative for photophobia, pain, discharge, itching and visual disturbance.       Right eyelid irritation  Some crust on lashes / no drainage   Skin:  Negative for wound.  Eyelid irritatoin   Neurological:  Negative for facial asymmetry and headaches.       Objective:   Physical Exam Constitutional:      General: She is not in acute distress.    Appearance: Normal appearance. She is normal weight. She is not ill-appearing or diaphoretic.  HENT:     Head: Normocephalic and atraumatic.     Right Ear: Tympanic membrane, ear canal and external ear normal.     Left Ear: Tympanic membrane, ear canal and external ear normal.     Nose: Nose normal. No congestion or rhinorrhea.     Mouth/Throat:     Mouth: Mucous membranes are moist.     Pharynx: Oropharynx is clear.   Eyes:     General: No scleral icterus.       Right eye: No discharge.        Left eye: No discharge.     Extraocular Movements: Extraocular movements intact.     Right eye: Normal extraocular motion.     Left eye: Normal extraocular motion.     Conjunctiva/sclera: Conjunctivae normal.     Pupils: Pupils are equal, round, and reactive to light.     Comments: Slight conjunctival injection- right eye far lateral   Lateral corner of lids is erythematous (mild) and crusty (with scale) No debris in lashes No evidence of chalazion or hordeolum   No skin breakdown  Few small skin tags   Vision grossly normal      Skin:    Coloration: Skin is not jaundiced.     Findings: Erythema present. No bruising.     Comments: No evidence of facial rosacea    Neurological:     Mental Status: She is alert. Mental status is at baseline.     Cranial Nerves: No cranial nerve deficit.   Psychiatric:        Mood and Affect: Mood normal.           Assessment & Plan:   Problem List Items Addressed This Visit       Musculoskeletal and Integument   Contact dermatitis -  Primary   Lateral right lower and upper eyelid  Pink with some scale  Some eye discomfort though eye looks normal   Unsure what trigger is - but retinoid product in heat and sun could be  Less likely makeup or nail polish  Advised to  Stop retinoid product  Use non scented cleanser/moisturizer and sunscreen/avoid eye area Hold off on eye makeup and remover for 2 weeks  For eye-lubricating drop if needed  Update if not starting to improve in a week or if worsening  Call back and Er precautions noted in detail today

## 2023-08-07 NOTE — Assessment & Plan Note (Addendum)
 Lateral right lower and upper eyelid  Pink with some scale  Some eye discomfort though eye looks normal   Unsure what trigger is - but retinoid product in heat and sun could be  Less likely makeup or nail polish  Advised to  Stop retinoid product  Use non scented cleanser/moisturizer and sunscreen/avoid eye area Hold off on eye makeup and remover for 2 weeks  For eye-lubricating drop if needed  Update if not starting to improve in a week or if worsening  Call back and Er precautions noted in detail today

## 2023-08-07 NOTE — Patient Instructions (Addendum)
 Stick with non scented cleansers and products for face  Cleanser - cetaphil is a good choice  Avoid the eye area   For now - stop the retinoid /retinal product   Continue sunscreen   Stop using eye makeup for 2 weeks   If the area does not calm down in 2 weeks give us  a call   If the eye itself gets itchy or painful or any vision change let us  know   I don't think you have an eye infection right now  Refresh eye drops or other lubricating eye drops are ok   Update if not starting to improve in a week or if worsening   Get to your eye doctor for annual check up  Get to your dermatologist for annual check up also

## 2023-08-28 ENCOUNTER — Ambulatory Visit: Admitting: Internal Medicine

## 2023-10-06 ENCOUNTER — Other Ambulatory Visit: Payer: Self-pay | Admitting: Family Medicine

## 2023-10-07 NOTE — Telephone Encounter (Signed)
 LOV: 02/11/23 NOV: nothing scheduled  Last Refill: PARoxetine  (PAXIL -CR) 25 MG 24 hr tablet  07/03/2023 90 tablets 0 refills

## 2023-12-02 DIAGNOSIS — Z23 Encounter for immunization: Secondary | ICD-10-CM | POA: Diagnosis not present

## 2023-12-10 DIAGNOSIS — L820 Inflamed seborrheic keratosis: Secondary | ICD-10-CM | POA: Diagnosis not present

## 2023-12-10 DIAGNOSIS — L309 Dermatitis, unspecified: Secondary | ICD-10-CM | POA: Diagnosis not present

## 2023-12-16 ENCOUNTER — Encounter: Payer: Self-pay | Admitting: Radiology

## 2024-01-02 ENCOUNTER — Other Ambulatory Visit: Payer: Self-pay | Admitting: Family Medicine

## 2024-01-02 DIAGNOSIS — F32A Depression, unspecified: Secondary | ICD-10-CM

## 2024-02-14 ENCOUNTER — Emergency Department (HOSPITAL_BASED_OUTPATIENT_CLINIC_OR_DEPARTMENT_OTHER): Admitting: Radiology

## 2024-02-14 ENCOUNTER — Other Ambulatory Visit (HOSPITAL_BASED_OUTPATIENT_CLINIC_OR_DEPARTMENT_OTHER): Payer: Self-pay

## 2024-02-14 ENCOUNTER — Emergency Department (HOSPITAL_BASED_OUTPATIENT_CLINIC_OR_DEPARTMENT_OTHER)
Admission: EM | Admit: 2024-02-14 | Discharge: 2024-02-14 | Disposition: A | Discharge status: Home / Self Care | Attending: Emergency Medicine | Admitting: Emergency Medicine

## 2024-02-14 ENCOUNTER — Encounter (HOSPITAL_BASED_OUTPATIENT_CLINIC_OR_DEPARTMENT_OTHER): Payer: Self-pay

## 2024-02-14 ENCOUNTER — Other Ambulatory Visit: Payer: Self-pay

## 2024-02-14 DIAGNOSIS — Z79899 Other long term (current) drug therapy: Secondary | ICD-10-CM | POA: Insufficient documentation

## 2024-02-14 DIAGNOSIS — Z7982 Long term (current) use of aspirin: Secondary | ICD-10-CM | POA: Diagnosis not present

## 2024-02-14 DIAGNOSIS — J101 Influenza due to other identified influenza virus with other respiratory manifestations: Secondary | ICD-10-CM | POA: Diagnosis not present

## 2024-02-14 DIAGNOSIS — Z853 Personal history of malignant neoplasm of breast: Secondary | ICD-10-CM | POA: Insufficient documentation

## 2024-02-14 DIAGNOSIS — R059 Cough, unspecified: Secondary | ICD-10-CM | POA: Diagnosis present

## 2024-02-14 LAB — MAGNESIUM: Magnesium: 1.9 mg/dL (ref 1.7–2.4)

## 2024-02-14 LAB — CBC WITH DIFFERENTIAL/PLATELET
Abs Immature Granulocytes: 0.01 K/uL (ref 0.00–0.07)
Basophils Absolute: 0 K/uL (ref 0.0–0.1)
Basophils Relative: 0 %
Eosinophils Absolute: 0 K/uL (ref 0.0–0.5)
Eosinophils Relative: 0 %
HCT: 41.4 % (ref 36.0–46.0)
Hemoglobin: 13.9 g/dL (ref 12.0–15.0)
Immature Granulocytes: 0 %
Lymphocytes Relative: 13 %
Lymphs Abs: 0.7 K/uL (ref 0.7–4.0)
MCH: 30.5 pg (ref 26.0–34.0)
MCHC: 33.6 g/dL (ref 30.0–36.0)
MCV: 91 fL (ref 80.0–100.0)
Monocytes Absolute: 0.7 K/uL (ref 0.1–1.0)
Monocytes Relative: 11 %
Neutro Abs: 4.5 K/uL (ref 1.7–7.7)
Neutrophils Relative %: 76 %
Platelets: 132 K/uL — ABNORMAL LOW (ref 150–400)
RBC: 4.55 MIL/uL (ref 3.87–5.11)
RDW: 13.6 % (ref 11.5–15.5)
WBC: 5.9 K/uL (ref 4.0–10.5)
nRBC: 0 % (ref 0.0–0.2)

## 2024-02-14 LAB — BASIC METABOLIC PANEL WITH GFR
Anion gap: 11 (ref 5–15)
BUN: 17 mg/dL (ref 8–23)
CO2: 29 mmol/L (ref 22–32)
Calcium: 9.1 mg/dL (ref 8.9–10.3)
Chloride: 100 mmol/L (ref 98–111)
Creatinine, Ser: 0.51 mg/dL (ref 0.44–1.00)
GFR, Estimated: >60 mL/min
Glucose, Bld: 103 mg/dL — ABNORMAL HIGH (ref 70–99)
Potassium: 3.4 mmol/L — ABNORMAL LOW (ref 3.5–5.1)
Sodium: 139 mmol/L (ref 135–145)

## 2024-02-14 LAB — RESP PANEL BY RT-PCR (RSV, FLU A&B, COVID)  RVPGX2
Influenza A by PCR: POSITIVE — AB
Influenza B by PCR: NEGATIVE
Resp Syncytial Virus by PCR: NEGATIVE
SARS Coronavirus 2 by RT PCR: NEGATIVE

## 2024-02-14 LAB — TROPONIN T, HIGH SENSITIVITY: Troponin T High Sensitivity: <15 ng/L (ref 0–19)

## 2024-02-14 MED ORDER — SODIUM CHLORIDE 0.9 % IV BOLUS
1000.0000 mL | Freq: Once | INTRAVENOUS | Status: AC
  Administered 2024-02-14: 1000 mL via INTRAVENOUS

## 2024-02-14 MED ORDER — BENZONATATE 100 MG PO CAPS
100.0000 mg | ORAL_CAPSULE | Freq: Once | ORAL | Status: AC
  Administered 2024-02-14: 100 mg via ORAL
  Filled 2024-02-14: qty 1

## 2024-02-14 MED ORDER — BENZONATATE 100 MG PO CAPS
100.0000 mg | ORAL_CAPSULE | Freq: Three times a day (TID) | ORAL | 0 refills | Status: AC
  Filled 2024-02-14: qty 21, 7d supply, fill #0

## 2024-02-14 MED ORDER — OSELTAMIVIR PHOSPHATE 75 MG PO CAPS
75.0000 mg | ORAL_CAPSULE | Freq: Two times a day (BID) | ORAL | 0 refills | Status: AC
  Filled 2024-02-14: qty 10, 5d supply, fill #0

## 2024-02-14 NOTE — Discharge Instructions (Addendum)
 Your flu test was positive.  Chest x-ray was normal.  Remainder of blood work was reassuring.  I have sent Tamiflu into the pharmacy for you.  Take this as directed.  Use the cough medicine as needed.  Return for any emergent symptoms.  Otherwise follow-up with your primary care doctor Monday or Tuesday for reevaluation.

## 2024-02-14 NOTE — ED Provider Notes (Signed)
 " [employer] EMERGENCY DEPARTMENT AT [facility] Provider Note   CSN: [id number] Arrival date & time: 02/14/24  9041     Patient presents with: Cough   [name] Price is a 84 y.o. female.   84 year old female presents today for concern of URI symptoms that started a couple days ago but has not improved (that has worsened with productive cough with green sputum, endorses body aches, chest pain particularly with movement or coughing spells.  Denies any significant health problems.  Has history of osteoporosis.  Does not take any daily medications.  She did recently travel down to [place]  to visit her sister.  She states her brother-in-law was sick.  The history is provided by the patient. No language interpreter was used.       Prior to Admission medications  Medication Sig Start Date End Date Taking? Authorizing Provider  ascorbic acid  (VITAMIN C) 500 MG tablet Take 500 mg by mouth daily.    [provider]  aspirin  EC 81 MG tablet Take 1 tablet (81 mg total) by mouth 2 (two) times daily. 03/25/23   Orlando Camellia POUR, PA-C  B Complex Vitamins (VITAMIN B COMPLEX) TABS Take 1 tablet by mouth every morning.    [provider]  Calcium  Citrate-Vitamin D  (CALCIUM  + D PO) Take 1 tablet by mouth daily.    [provider]  cetirizine (ZYRTEC) 10 MG tablet Take 10 mg by mouth daily as needed for allergies.    [provider]  cyanocobalamin (VITAMIN B12) 1000 MCG tablet Take 1,000 mcg by mouth daily.    [provider]  PARoxetine  (PAXIL -CR) 25 MG 24 hr tablet TAKE 1 TABLET BY MOUTH EVERY DAY IN THE MORNING 01/02/24   Cleatus Arlyss RAMAN, MD    Allergies: Codeine and Fosamax  [alendronate ]    Review of Systems  Constitutional:  Positive for fatigue. Negative for chills and fever.  Respiratory:  Positive for cough. Negative for shortness of breath.   Cardiovascular:  Positive for chest pain.  Neurological:  Positive for weakness.  All  other systems reviewed and are negative.   Updated Vital Signs BP (!) 149/70 (BP Location: Right Arm)   Pulse 96   Temp 98.7 F (37.1 C) (Oral)   Resp 18   Ht 5' 3 (1.6 m)   Wt 61.2 kg   SpO2 99%   BMI 23.91 kg/m   Physical Exam Vitals and nursing note reviewed.  Constitutional:      General: She is not in acute distress.    Appearance: Normal appearance. She is not ill-appearing.  HENT:     Head: Normocephalic and atraumatic.     Nose: Nose normal.  Eyes:     Conjunctiva/sclera: Conjunctivae normal.  Cardiovascular:     Rate and Rhythm: Normal rate and regular rhythm.  Pulmonary:     Effort: Pulmonary effort is normal. No respiratory distress.     Breath sounds: Normal breath sounds. No wheezing.  Musculoskeletal:        General: No deformity. Normal range of motion.     Cervical back: Normal range of motion.  Skin:    Findings: No rash.  Neurological:     Mental Status: She is alert.     (all labs ordered are listed, but only abnormal results are displayed) Labs Reviewed  RESP PANEL BY RT-PCR (RSV, FLU A&B, COVID)  RVPGX2  CBC WITH DIFFERENTIAL/PLATELET  BASIC METABOLIC PANEL WITH GFR  MAGNESIUM   TROPONIN T, HIGH SENSITIVITY  EKG: None  Radiology: No results found.   Procedures   Medications Ordered in the ED - No data to display  Clinical Course as of 02/14/24 1414  Fri Feb 14, 2024  1212 BMP without acute concerns.  CBC without acute concerns.  Respiratory panel is positive for influenza.  Initial troponin is within normal.  EKG without acute ischemic change.  Chest x-ray pending. [AA]  1246 Chest x-ray without acute cardiopulmonary process.  Will ambulate to ensure she does not desaturate.  If she does okay with the ambulation we will discuss with her discharge plan. [AA]    Clinical Course User Index [AA] Hildegard Loge, PA-C                                 Medical Decision Making Amount and/or Complexity of Data Reviewed Labs:  ordered. Radiology: ordered.  Risk Prescription drug management.   Medical Decision Making / ED Course   This patient presents to the ED for concern of cough, weakness, chest pain, this involves an extensive number of treatment options, and is a complaint that carries with it a high risk of complications and morbidity.  The differential diagnosis includes ACS, PE, pneumonia, viral URI, MSK etiology  MDM: 84 year old female who is otherwise healthy presents today with URI symptoms.  Also chest pain with coughing spells or movement.  Will obtain ACS workup.  Will obtain chest x-ray. Respiratory panel pending.  Patient was ambulated.  No desaturation and O2.  Patient lives alone but states her neighbor looks after her.  She feels comfortable with discharge.  She would just like some IV fluids prior to discharge.  Will give this.  Strict return precautions discussed.  Patient is appropriate for discharge.  Discharged in stable condition. Discussed with attending.  Lab Tests: -I ordered, reviewed, and interpreted labs.   The pertinent results include:   Labs Reviewed  RESP PANEL BY RT-PCR (RSV, FLU A&B, COVID)  RVPGX2  CBC WITH DIFFERENTIAL/PLATELET  BASIC METABOLIC PANEL WITH GFR  MAGNESIUM   TROPONIN T, HIGH SENSITIVITY      EKG  EKG Interpretation Date/Time:    Ventricular Rate:    PR Interval:    QRS Duration:    QT Interval:    QTC Calculation:   R Axis:      Text Interpretation:           Imaging Studies ordered: I ordered imaging studies including chest x-ray I independently visualized and interpreted imaging. I agree with the radiologist interpretation   Medicines ordered and prescription drug management: No orders of the defined types were placed in this encounter.   -I have reviewed the patients home medicines and have made adjustments as needed  Cardiac Monitoring: The patient was maintained on a cardiac monitor.  I personally viewed and interpreted  the cardiac monitored which showed an underlying rhythm of: Normal sinus rhythm  Social Determinants of Health:  Factors impacting patients care include: Patient lives alone, elderly   Reevaluation: After the interventions noted above, I reevaluated the patient and found that they have :improved  Co morbidities that complicate the patient evaluation  Past Medical History:  Diagnosis Date   Allergy    Anxiety    Arthritis    Blood transfusion without reported diagnosis    as baby   Breast cancer (HCC) 1999-2000   s/p lumpectomy and radiation, no chemo-right   Cataract    History of  chicken pox    History of hip replacement    Hot flashes    treated wtih paxil    Hyperlipidemia    Osteoporosis    DXA 11/12/17   UTI (urinary tract infection)       Dispostion: Discharged in stable condition.  Return precautions discussed.  Patient voices understanding and is in agreement with plan. Patient discussed with attending.  Final diagnoses:  Influenza A    ED Discharge Orders          Ordered    benzonatate  (TESSALON ) 100 MG capsule  Every 8 hours        02/14/24 1419    oseltamivir  (TAMIFLU ) 75 MG capsule  Every 12 hours        02/14/24 1419               Hildegard Loge, PA-C 02/14/24 1420    Levander Houston, MD 02/27/24 1438  "

## 2024-02-14 NOTE — ED Notes (Signed)
 Ambulating with RT

## 2024-02-14 NOTE — ED Notes (Signed)
 ED Provider at bedside.

## 2024-02-14 NOTE — ED Notes (Signed)
 RT ambulated with patient. Pt tolerated well. O2 sat was 94% and HR was 116. Pt tolerated well.

## 2024-02-14 NOTE — ED Triage Notes (Signed)
 Complaining of a cough since new years. Is productive with a greenish/yellow sputum. Has also had some body aches as well. The cough is making her chest hurt.

## 2024-02-14 NOTE — ED Notes (Signed)
 Reviewed discharge instructions, medications, and home care with pt. Pt verbalized understanding and had no further questions. Pt exited ED without complications.

## 2024-02-24 ENCOUNTER — Telehealth: Payer: Self-pay

## 2024-02-24 NOTE — Transitions of Care (Post Inpatient/ED Visit) (Signed)
 "  02/24/2024  Name: [name] Price MRN: [id number] DOB: [birth year]  Today's TOC FU Call Status: Today's TOC FU Call Status:: Successful TOC FU Call Completed TOC FU Call Complete Date: 02/24/24  Patient's Name and Date of Birth confirmed. DOB, Name  Transition Care Management Follow-up Telephone Call Date of Discharge: 02/23/24 Discharge Facility: Other ([facility]) Name of Other ([facility]) Discharge Facility: [facility] Type of Discharge: Inpatient Admission Primary Inpatient Discharge Diagnosis:: Acute Respiratory Failure with Hypoxia; Influenza A How have you been since you were released from the hospital?: Better Any questions or concerns?: No  Items Reviewed: Did you receive and understand the discharge instructions provided?: Yes Medications obtained,verified, and reconciled?: Yes (Medications Reviewed) Any new allergies since your discharge?: No Dietary orders reviewed?: NA Do you have support at home?: Yes  Medications Reviewed Today: Medications Reviewed Today     Reviewed by Lavelle Charmaine NOVAK, LPN (Licensed Practical Nurse) on 02/24/24 at [phone]  Med List Status: <None>   Medication Order Taking? Sig Documenting Provider Last Dose Status Informant  ascorbic acid  (VITAMIN C) 500 MG tablet [id number]  Take 500 mg by mouth daily. [provider]  Active Self           Med Note JERALYN, KYLE A   Fri Mar 08, 2023 10:14 AM) On hold for procedure   aspirin  EC 81 MG tablet [id number]  Take 1 tablet (81 mg total) by mouth 2 (two) times daily. Orlando Camellia POUR, PA-C  Active   B Complex Vitamins (VITAMIN B COMPLEX) TABS [id number]  Take 1 tablet by mouth every morning. [provider]  Active Self           Med Note JERALYN, KYLE A   Fri Mar 08, 2023 10:15 AM) On hold for procedure   benzonatate  (TESSALON ) 100 MG capsule [id number]  Take 1 capsule (100 mg total) by mouth every 8 (eight) hours. Hildegard Loge, PA-C  Active   Calcium   Citrate-Vitamin D  (CALCIUM  + D PO) [id number]  Take 1 tablet by mouth daily. [provider]  Active Self           Med Note JERALYN, KYLE A   Fri Mar 08, 2023 10:15 AM) On hold for procedure   cetirizine (ZYRTEC) 10 MG tablet [id number]  Take 10 mg by mouth daily as needed for allergies. [provider]  Active Self  cyanocobalamin (VITAMIN B12) 1000 MCG tablet [id number]  Take 1,000 mcg by mouth daily. [provider]  Active Self           Med Note JERALYN, KYLE A   Fri Mar 08, 2023 10:15 AM) On hold for procedure   oseltamivir  (TAMIFLU ) 75 MG capsule [id number]  Take 1 capsule (75 mg total) by mouth every 12 (twelve) hours. Hildegard Loge, PA-C  Active   PARoxetine  (PAXIL -CR) 25 MG 24 hr tablet [id number]  TAKE 1 TABLET BY MOUTH EVERY DAY IN THE MORNING Cleatus Arlyss RAMAN, MD  Active             Home Care and Equipment/Supplies: Were Home Health Services Ordered?: NA Any new equipment or medical supplies ordered?: NA  Functional Questionnaire: Do you need assistance with bathing/showering or dressing?: No Do you need assistance with meal preparation?: Yes Do you need assistance with eating?: No Do you have difficulty maintaining continence: No Do you need assistance with getting out of bed/getting out of a chair/moving?: No Do you have difficulty managing or taking your medications?: No  Follow up appointments reviewed: PCP Follow-up appointment confirmed?: No (Patient is currently out of town at her daughter's home; will call when she returns if appointment is needed) Specialist Hospital Follow-up appointment confirmed?: NA Do you need transportation to your follow-up appointment?: No Do you understand care options if your condition(s) worsen?: Yes-patient verbalized understanding    SIGNATURE Charmaine Bloodgood, LPN [facility] Advisor [facility]. [facility] Direct Dial [phone]  "

## 2024-02-25 ENCOUNTER — Telehealth: Payer: Self-pay | Admitting: Family Medicine

## 2024-02-25 NOTE — Progress Notes (Signed)
 CARE COORDINATOR CONTACT WITH PATIENT/CAREGIVER AFTER HOSPITAL DISCHARGE   Patient contacted.  Encouraged to schedule Hospital follow up appointment with their Non NH PCP.   Patient states her daughter called and is waiting to hear back from the office.

## 2024-02-25 NOTE — Telephone Encounter (Unsigned)
 Copied from CRM [id number]. Topic: Referral - Question >> Feb 25, 2024  9:12 AM Tiffini S wrote: Reason for CRM: Patient daughter Deitra Popp [phone] states that there was a referral for pulmonology and their next available appointment is in April which is too far out- the patient will be here until she gets better and will be leaving going back to [facility], [place]-  should be here for another three weeks for breathing treatments and will not be here in April. The daughter is asking for another referral with a sooner appointment date  Please call to follow up with her for an update

## 2024-03-05 ENCOUNTER — Inpatient Hospital Stay: Admitting: Family Medicine

## 2024-03-16 ENCOUNTER — Inpatient Hospital Stay: Admitting: Family Medicine

## 2024-04-16 ENCOUNTER — Ambulatory Visit: Payer: Medicare Other

## 2024-04-24 ENCOUNTER — Ambulatory Visit
